# Patient Record
Sex: Female | Born: 1945
Health system: Southern US, Community
[De-identification: ages and names within clinical notes are randomized; demographics above are authoritative.]

## PROBLEM LIST (undated history)

## (undated) DIAGNOSIS — E550 Rickets, active: Secondary | ICD-10-CM

## (undated) DIAGNOSIS — I10 Essential (primary) hypertension: Secondary | ICD-10-CM

## (undated) DIAGNOSIS — C50919 Malignant neoplasm of unspecified site of unspecified female breast: Secondary | ICD-10-CM

## (undated) DIAGNOSIS — N6029 Fibroadenosis of unspecified breast: Secondary | ICD-10-CM

## (undated) DIAGNOSIS — F32A Depression, unspecified: Secondary | ICD-10-CM

## (undated) DIAGNOSIS — B029 Zoster without complications: Secondary | ICD-10-CM

## (undated) DIAGNOSIS — N289 Disorder of kidney and ureter, unspecified: Secondary | ICD-10-CM

## (undated) DIAGNOSIS — C50419 Malignant neoplasm of upper-outer quadrant of unspecified female breast: Secondary | ICD-10-CM

## (undated) DIAGNOSIS — C801 Malignant (primary) neoplasm, unspecified: Secondary | ICD-10-CM

## (undated) DIAGNOSIS — M1990 Unspecified osteoarthritis, unspecified site: Secondary | ICD-10-CM

## (undated) DIAGNOSIS — M839 Adult osteomalacia, unspecified: Secondary | ICD-10-CM

## (undated) DIAGNOSIS — M109 Gout, unspecified: Secondary | ICD-10-CM

## (undated) HISTORY — DX: Essential (primary) hypertension: I10

## (undated) HISTORY — DX: Malignant neoplasm of upper-outer quadrant of unspecified female breast: C50.419

## (undated) HISTORY — DX: Depression, unspecified: F32.A

## (undated) HISTORY — DX: Unspecified osteoarthritis, unspecified site: M19.90

## (undated) HISTORY — DX: Malignant (primary) neoplasm, unspecified: C80.1

## (undated) HISTORY — DX: Fibroadenosis of unspecified breast: N60.29

## (undated) HISTORY — DX: Disorder of phosphorus metabolism, unspecified: E83.30

## (undated) HISTORY — DX: Zoster without complications: B02.9

## (undated) HISTORY — DX: Adult osteomalacia, unspecified: M83.9

## (undated) HISTORY — DX: Gout, unspecified: M10.9

---
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
## 1980-04-21 HISTORY — PX: DILATION AND CURETTAGE OF UTERUS: SHX78

## 2003-04-22 LAB — HM PAP SMEAR: HM Pap smear: NORMAL

## 2007-04-22 DIAGNOSIS — I10 Essential (primary) hypertension: Secondary | ICD-10-CM

## 2007-04-22 HISTORY — DX: Essential (primary) hypertension: I10

## 2008-01-09 ENCOUNTER — Emergency Department: Payer: Self-pay | Admitting: Emergency Medicine

## 2010-04-21 HISTORY — PX: UPPER GI ENDOSCOPY: SHX6162

## 2010-04-21 HISTORY — PX: COLONOSCOPY: SHX174

## 2010-06-25 ENCOUNTER — Ambulatory Visit: Payer: Self-pay | Admitting: Family Medicine

## 2010-06-27 ENCOUNTER — Ambulatory Visit: Payer: Self-pay | Admitting: Family Medicine

## 2010-07-18 LAB — HM COLONOSCOPY: HM Colonoscopy: NORMAL

## 2010-08-07 ENCOUNTER — Ambulatory Visit: Payer: Self-pay | Admitting: General Surgery

## 2011-02-07 ENCOUNTER — Ambulatory Visit: Payer: Self-pay | Admitting: General Surgery

## 2011-04-22 DIAGNOSIS — B029 Zoster without complications: Secondary | ICD-10-CM

## 2011-04-22 DIAGNOSIS — M109 Gout, unspecified: Secondary | ICD-10-CM

## 2011-04-22 DIAGNOSIS — C801 Malignant (primary) neoplasm, unspecified: Secondary | ICD-10-CM

## 2011-04-22 DIAGNOSIS — C50919 Malignant neoplasm of unspecified site of unspecified female breast: Secondary | ICD-10-CM

## 2011-04-22 HISTORY — DX: Malignant neoplasm of unspecified site of unspecified female breast: C50.919

## 2011-04-22 HISTORY — PX: MASTECTOMY: SHX3

## 2011-04-22 HISTORY — DX: Gout, unspecified: M10.9

## 2011-04-22 HISTORY — DX: Malignant (primary) neoplasm, unspecified: C80.1

## 2011-04-22 HISTORY — DX: Zoster without complications: B02.9

## 2011-08-11 ENCOUNTER — Ambulatory Visit: Payer: Self-pay | Admitting: General Surgery

## 2011-08-26 ENCOUNTER — Ambulatory Visit: Payer: Self-pay | Admitting: General Surgery

## 2011-09-18 ENCOUNTER — Ambulatory Visit: Payer: Self-pay | Admitting: General Surgery

## 2011-09-29 ENCOUNTER — Ambulatory Visit: Payer: Self-pay | Admitting: Unknown Physician Specialty

## 2011-09-30 ENCOUNTER — Ambulatory Visit: Payer: Self-pay | Admitting: General Surgery

## 2011-09-30 DIAGNOSIS — C50419 Malignant neoplasm of upper-outer quadrant of unspecified female breast: Secondary | ICD-10-CM

## 2011-09-30 HISTORY — PX: BREAST SURGERY: SHX581

## 2011-09-30 HISTORY — DX: Malignant neoplasm of upper-outer quadrant of unspecified female breast: C50.419

## 2011-11-03 ENCOUNTER — Ambulatory Visit: Payer: Self-pay | Admitting: General Surgery

## 2011-11-03 LAB — CBC WITH DIFFERENTIAL/PLATELET
Basophil #: 0 10*3/uL (ref 0.0–0.1)
Basophil %: 0.5 %
Eosinophil %: 1.3 %
HCT: 39.9 % (ref 35.0–47.0)
HGB: 12.9 g/dL (ref 12.0–16.0)
Lymphocyte #: 2 10*3/uL (ref 1.0–3.6)
Lymphocyte %: 25.3 %
MCH: 29.5 pg (ref 26.0–34.0)
MCV: 91 fL (ref 80–100)
Monocyte #: 0.3 x10 3/mm (ref 0.2–0.9)
Monocyte %: 4.5 %
Neutrophil #: 5.3 10*3/uL (ref 1.4–6.5)
Platelet: 286 10*3/uL (ref 150–440)
RBC: 4.39 10*6/uL (ref 3.80–5.20)
WBC: 7.8 10*3/uL (ref 3.6–11.0)

## 2011-11-03 LAB — COMPREHENSIVE METABOLIC PANEL
Albumin: 4.2 g/dL (ref 3.4–5.0)
Anion Gap: 7 (ref 7–16)
BUN: 16 mg/dL (ref 7–18)
Bilirubin,Total: 0.4 mg/dL (ref 0.2–1.0)
Calcium, Total: 9.2 mg/dL (ref 8.5–10.1)
Co2: 28 mmol/L (ref 21–32)
EGFR (African American): >60
EGFR (Non-African Amer.): 56 — ABNORMAL LOW
Glucose: 159 mg/dL — ABNORMAL HIGH (ref 65–99)
Osmolality: 278 (ref 275–301)
Potassium: 4.1 mmol/L (ref 3.5–5.1)
SGPT (ALT): 24 U/L
Sodium: 137 mmol/L (ref 136–145)
Total Protein: 8.1 g/dL (ref 6.4–8.2)

## 2011-11-04 LAB — CEA: CEA: 1.1 ng/mL (ref 0.0–4.7)

## 2011-11-05 ENCOUNTER — Ambulatory Visit: Payer: Self-pay | Admitting: General Surgery

## 2011-11-06 ENCOUNTER — Ambulatory Visit: Payer: Self-pay | Admitting: General Surgery

## 2011-12-06 DIAGNOSIS — Z8619 Personal history of other infectious and parasitic diseases: Secondary | ICD-10-CM | POA: Insufficient documentation

## 2011-12-31 IMAGING — MG MAM DGTL SCREENING MAMMO W/CAD
1 series · 4 of 4 positions shown · non-contrast
Comparison: none

REASON FOR EXAM: scr unsure of last
COMMENTS:

[R CC · right · 4 of 4 slices shown]
[im 1/4]
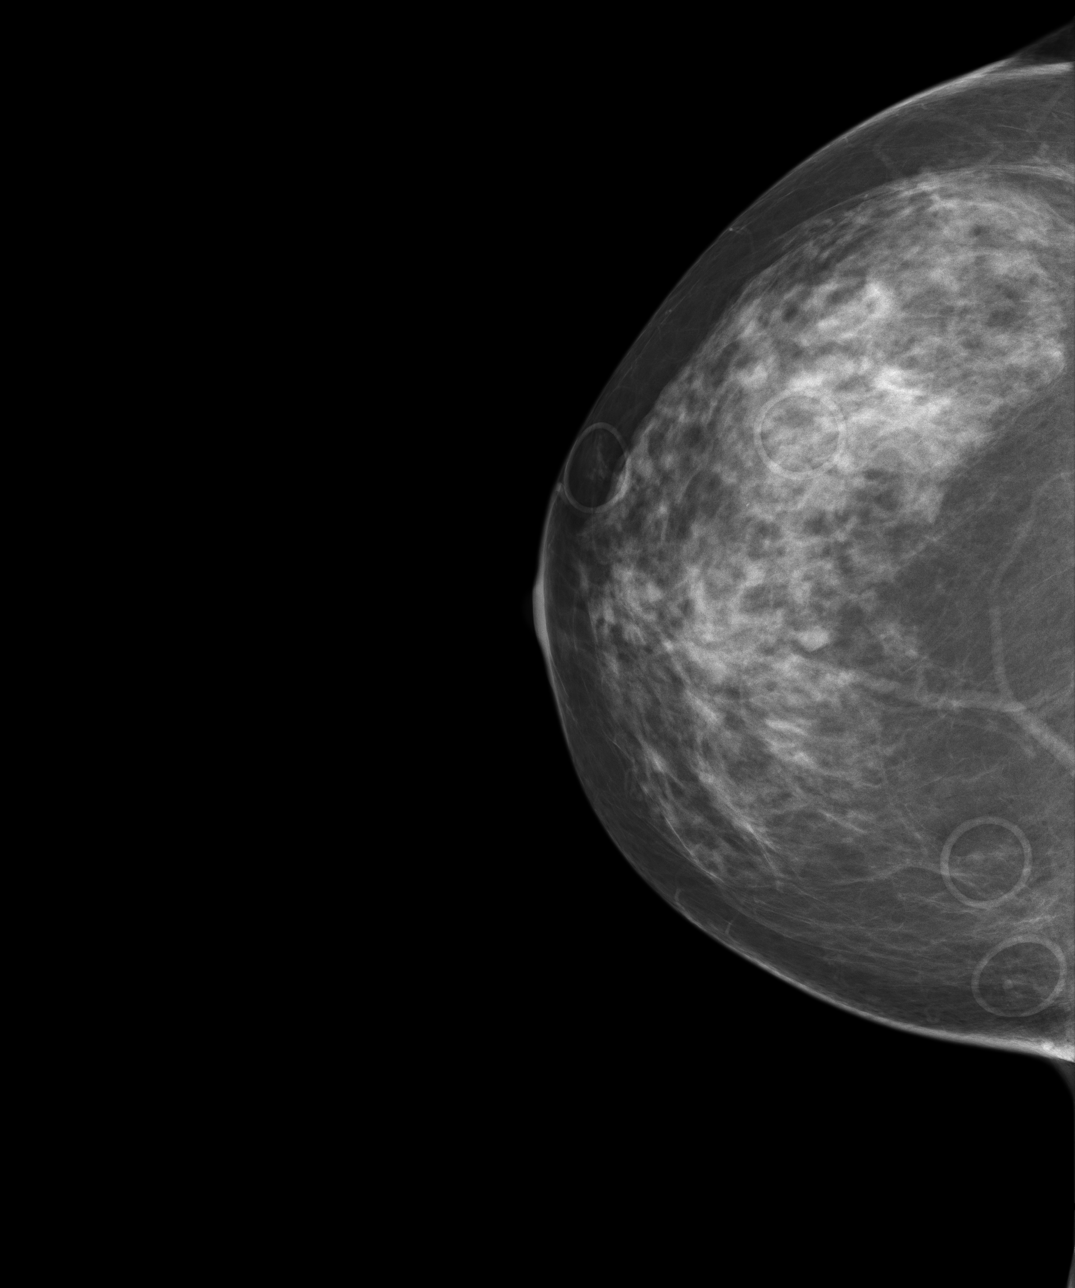
[im 2/4]
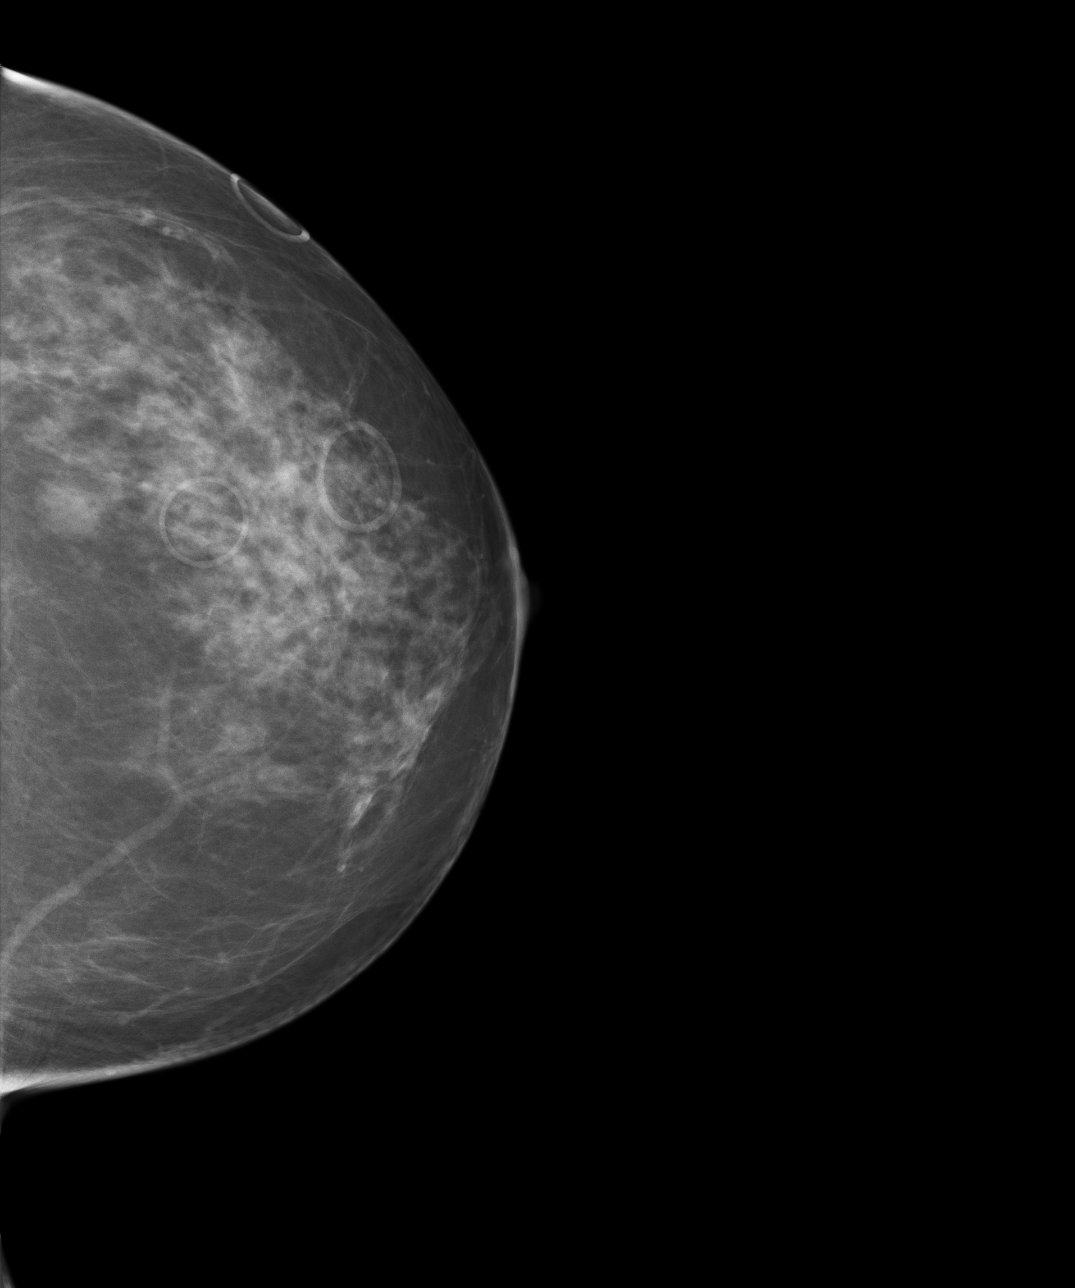
[im 3/4]
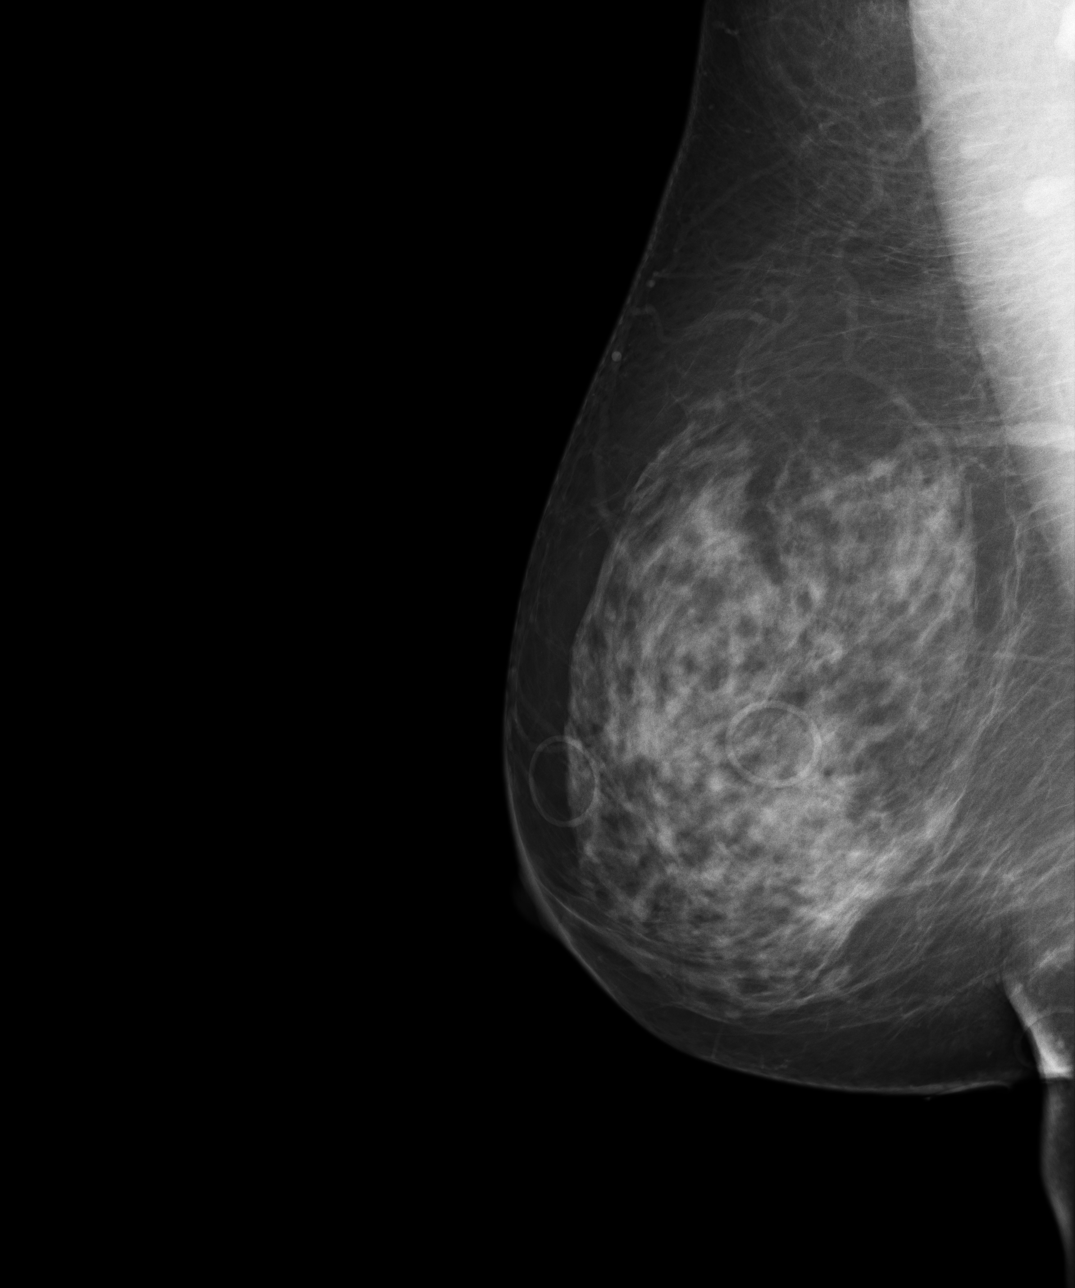
[im 4/4]
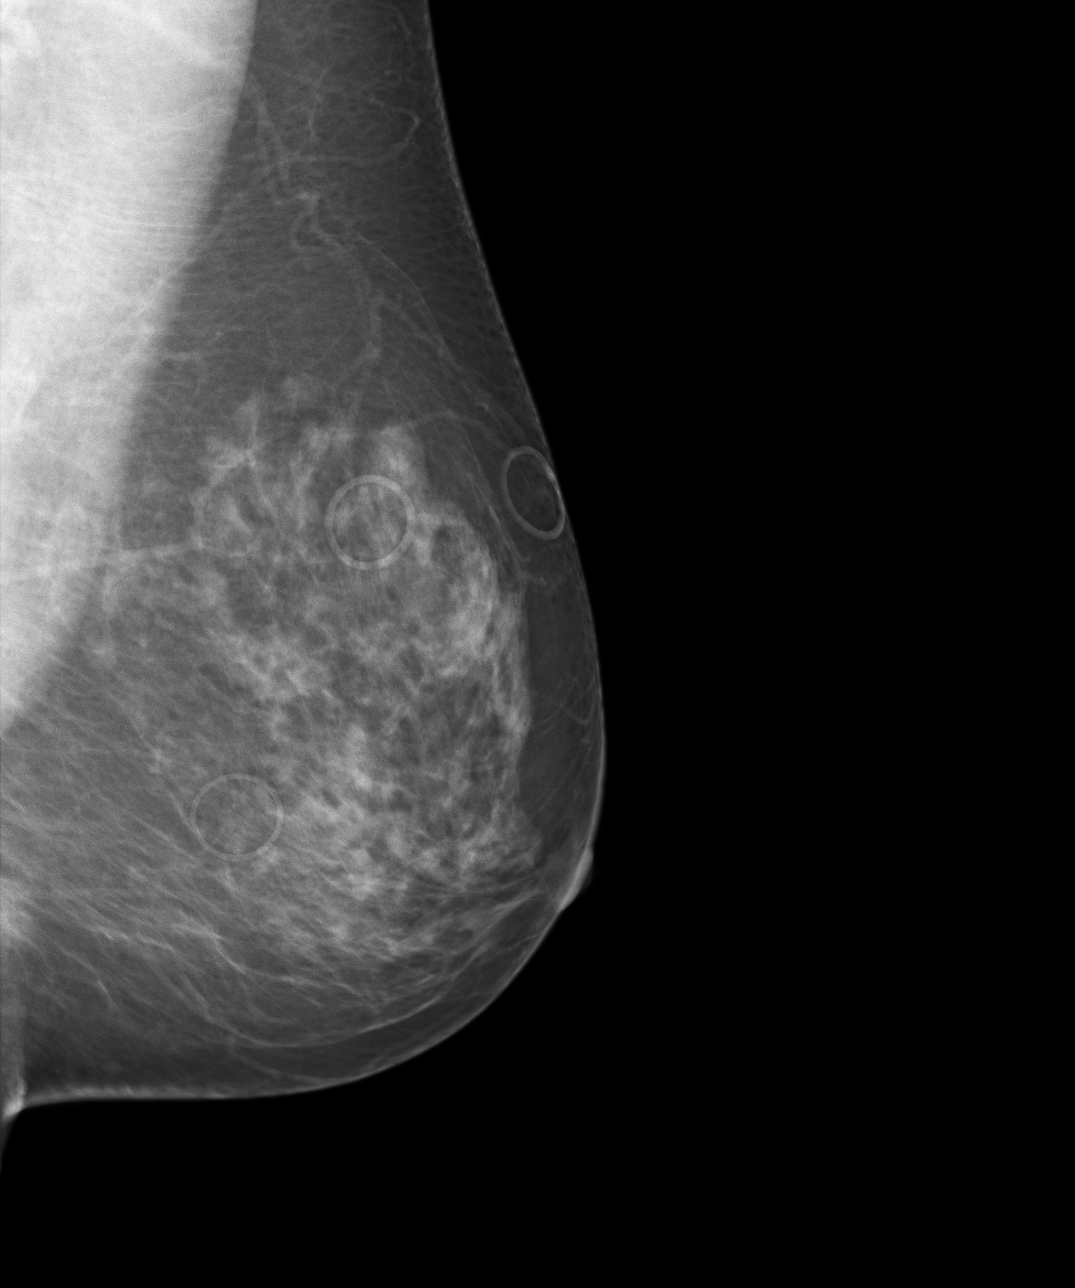

[4 of 4 positions shown; findings below may reference images not displayed]

PROCEDURE:     MAM - MAM DGTL SCREENING MAMMO W/CAD  - June 25, 2010  [DATE]

RESULT:     There is no known family history of breast cancer. No previous
mammograms are available for comparison.

There is a moderate to dense parenchymal pattern bilaterally. Skin lesions
are marked bilaterally. There is an elliptical area of density posteriorly
just lateral to the nipple in the left breast on the CC images which may
project superiorly on the MLO projection. Additional magnification
compression images of this area are recommended. If this area persists,
ultrasound would be recommended.
IMPRESSION: 1. BI-RADS:   Category 0-Needs Additional Imaging Evaluation.
2. Please have the patient return for additional magnification compression
images of the left breast in the area marked and saved electronically. The
images otherwise are not concerning for malignant disease.

A negative mammogram report does not preclude biopsy or other evaluation of
a clinically palpable or otherwise suspicious mass or lesion.  Breast cancer
may not be detected by mammography in up to 10% of cases.

## 2012-07-15 ENCOUNTER — Encounter: Payer: Self-pay | Admitting: General Surgery

## 2012-07-20 LAB — HM DEXA SCAN: HM Dexa Scan: NORMAL

## 2012-08-17 ENCOUNTER — Ambulatory Visit: Payer: Self-pay | Admitting: General Surgery

## 2012-08-23 ENCOUNTER — Encounter: Payer: Self-pay | Admitting: General Surgery

## 2012-08-25 ENCOUNTER — Ambulatory Visit: Payer: Self-pay | Admitting: General Surgery

## 2012-08-26 ENCOUNTER — Encounter: Payer: Self-pay | Admitting: General Surgery

## 2012-08-28 NOTE — Progress Notes (Signed)
Quick Note:  Bone scan shows evidence of osteoporosis of the forearm, mild osteopenia of the femur.  Will need to review calcium/ Vit D intake and consider addition of Fosamax. Not a candidate for Evista secondary to ER+ breast cancer.    Cc: Cristino Martes, MD ______

## 2012-09-08 ENCOUNTER — Encounter: Payer: Self-pay | Admitting: General Surgery

## 2012-09-08 ENCOUNTER — Ambulatory Visit (INDEPENDENT_AMBULATORY_CARE_PROVIDER_SITE_OTHER): Payer: Medicare Other | Admitting: General Surgery

## 2012-09-08 VITALS — BP 138/76 | HR 72 | Resp 12 | Ht <= 58 in | Wt 126.0 lb

## 2012-09-08 DIAGNOSIS — Z8639 Personal history of other endocrine, nutritional and metabolic disease: Secondary | ICD-10-CM | POA: Insufficient documentation

## 2012-09-08 DIAGNOSIS — C50919 Malignant neoplasm of unspecified site of unspecified female breast: Secondary | ICD-10-CM

## 2012-09-08 DIAGNOSIS — Z853 Personal history of malignant neoplasm of breast: Secondary | ICD-10-CM | POA: Insufficient documentation

## 2012-09-08 DIAGNOSIS — C50912 Malignant neoplasm of unspecified site of left female breast: Secondary | ICD-10-CM

## 2012-09-08 DIAGNOSIS — M810 Age-related osteoporosis without current pathological fracture: Secondary | ICD-10-CM

## 2012-09-08 MED ORDER — TAMOXIFEN CITRATE 20 MG PO TABS: 20.0000 mg | ORAL_TABLET | Freq: Every day | ORAL | Status: DC

## 2012-09-08 NOTE — Progress Notes (Signed)
Patient ID: Felicia Robinson, female   DOB: [birth year], 67 y.o.   MRN: [id number]  Chief Complaint  Patient presents with  . Other    mammogram    HPI Felicia Robinson is a 67 y.o. female who presents for a follow up mammogram. The most recent mammogram was done on 08/25/12 with birad category 2. The patient has a history of left breast cancer diagnosed in July 2013 She had a left breast mastectomy in 2013. The patient admits regular self breast checks and gets regular mammograms done. She also had a bone density scan done on 08/17/12 at [facility].   The patient reports modest shoulder discomfort and a sense that her strength is not as good as in the past. She is having intermittent hot flashes and as well as some significant mood changes. This all began after initiation of Femara. She has had some home stress, but in general was doing well.   HPI  Past Medical History  Diagnosis Date  . Hypertension 2009  . Gout 2013  . Shingles 2013  . Cancer 2012    L breast  . Disorders of phosphorus metabolism     rickets  . Fibroadenosis of breast   . Osteomalacia   . Malignant neoplasm of upper-outer quadrant of female breast 2012    L breast    Past Surgical History  Procedure Laterality Date  . Mastectomy Left 2013    with SN biopsy  . Colonoscopy  2012  . Upper gi endoscopy  2012  . Tonsillectomy  1953  . Dilation and curettage of uterus  1982  . Breast surgery Left 10/2010    Family History  Problem Relation Age of Onset  . Lymphoma Brother 44    Hodgkin's Lymphoma  . Cancer Maternal Uncle 82    cancer of the esophagus    Social History History  Substance Use Topics  . Smoking status: Former Smoker -- 0.50 packs/day for 30 years    Types: Cigarettes    Quit date: 04/21/2006  . Smokeless tobacco: Never Used  . Alcohol Use: Yes    Allergies  Allergen Reactions  . Codeine Nausea And Vomiting  . Other     Pain medications Altered mental status  . Shellfish Allergy Diarrhea and  Nausea And Vomiting    Current Outpatient Prescriptions  Medication Sig Dispense Refill  . ALPRAZolam (XANAX) 0.5 MG tablet Take 60 mg by mouth daily.      Marland Kitchen aspirin 81 MG chewable tablet Chew 81 mg by mouth daily.      . calcium carbonate (OS-CAL) 600 MG TABS Take 600 mg by mouth daily.      . fish oil-omega-3 fatty acids 1000 MG capsule Take 2 g by mouth daily.      Marland Kitchen letrozole (FEMARA) 2.5 MG tablet Take 30 mg by mouth daily.      Marland Kitchen losartan (COZAAR) 100 MG tablet Take 100 mg by mouth daily.      . Multiple Vitamins-Minerals (CENTRUM SILVER ADULT 50+ PO) Take by mouth.      . nebivolol (BYSTOLIC) 10 MG tablet Take 10 mg by mouth daily.      . tamoxifen (NOLVADEX) 20 MG tablet Take 1 tablet (20 mg total) by mouth daily.  30 tablet  11   No current facility-administered medications for this visit.    Review of Systems Review of Systems  Constitutional: Negative.   Respiratory: Negative.   Cardiovascular: Negative.     Blood pressure 138/76, pulse  72, resp. rate 12, height 4\' 8"  (1.422 m), weight 126 lb (57.153 kg).  Physical Exam Physical Exam  Constitutional: She appears well-developed and well-nourished.  Neck: Trachea normal. No mass and no thyromegaly present.  Cardiovascular: Normal rate, regular rhythm, normal heart sounds and normal pulses.   No murmur heard. Pulmonary/Chest: Effort normal and breath sounds normal. Right breast exhibits no inverted nipple, no mass, no nipple discharge, no skin change and no tenderness.  Left mastectomy site doing well. Left axilla is fine.   Lymphadenopathy:    She has no cervical adenopathy.    She has no axillary adenopathy.    Data Reviewed Bone density showed the significant osteoporosis in the forearm, minimal changes in the femur. I spoke with the radiologist and there is no compensation factor for patients with rickets, so this will require treatment.  Unilateral right mammogram dated 08/25/2012 was reviewed. No interval  change. BI-RAD-2.  Assessment      Left breast cancer, estrogen receptor positive.  Possible myalgia secondary to Femara, possible osteoporosis secondary to Femara.    Plan    The patient has been taking a calcium supplement daily, she's been asked to increase his to twice a day. She will complete her few remaining tablets of Femara and starting on June one, 2014 we'll initiate tamoxifen therapy, 20 mg daily. She may have some the same hot flash and side effects probably resolution of the myalgias and improvement in the bone density. She was asked to give a phone progress report on July 1, and encouraged to take for at least a month to see how the side effects play out.  If her mood swings don't improve consideration would be to discontinuing her benzodiazepine and initiating Effexor.  The plan for followup exam in 6 months.       Felicia Robinson 09/08/2012, 10:54 AM

## 2012-09-08 NOTE — Patient Instructions (Addendum)
Patient advised to double up on her calcium daily. She is also advised to finish Femara and then start Tamoxifen 20 mg by mouth daily. Patient to return in 6 months. Patient to give our office a call in 1 month for a report on new medication.

## 2012-10-19 ENCOUNTER — Telehealth: Payer: Self-pay

## 2012-10-19 NOTE — Telephone Encounter (Signed)
Patient called to say med change to Tamoxifen has been working very well for her. She says the pain had greatly decreased and that she has had a big decrease in the number of hot flashes that she has been experiencing. She reports that she has been on the Tamoxifen for 30 day now.

## 2013-03-09 ENCOUNTER — Ambulatory Visit (INDEPENDENT_AMBULATORY_CARE_PROVIDER_SITE_OTHER): Payer: Medicare Other | Admitting: General Surgery

## 2013-03-09 ENCOUNTER — Encounter: Payer: Self-pay | Admitting: General Surgery

## 2013-03-09 VITALS — BP 170/82 | HR 82 | Resp 14 | Ht <= 58 in | Wt 127.0 lb

## 2013-03-09 DIAGNOSIS — C50919 Malignant neoplasm of unspecified site of unspecified female breast: Secondary | ICD-10-CM

## 2013-03-09 DIAGNOSIS — C50912 Malignant neoplasm of unspecified site of left female breast: Secondary | ICD-10-CM

## 2013-03-09 NOTE — Patient Instructions (Signed)
Patient to return in May 2015 right breast diagnotic mammogram .

## 2013-03-09 NOTE — Progress Notes (Signed)
Patient ID: Felicia Robinson, female   DOB: 1945/07/28, 67 y.o.   MRN: [id number]  Chief Complaint  Patient presents with  . Follow-up    breast cancer    HPI Felicia Robinson is a 67 y.o. female here today following up for her 6 month breast cancer. Patient states she has not had any new problems. She is still taking her Tamoxifen. She states still having intermittent hot flashes, but these are significantly less frequent than what she was experiencing making use of Letrazol. The musculoskeletal symptoms she was experiencing  Letrazol have not recurred since she switched to tamoxifen.  HPI  Past Medical History  Diagnosis Date  . Hypertension 2009  . Gout 2013  . Shingles 2013  . Cancer 2012    L breast  . Disorders of phosphorus metabolism     rickets  . Fibroadenosis of breast   . Osteomalacia   . Malignant neoplasm of upper-outer quadrant of female breast 2012    L breast    Past Surgical History  Procedure Laterality Date  . Mastectomy Left 2013    with SN biopsy  . Colonoscopy  2012  . Upper gi endoscopy  2012  . Tonsillectomy  1953  . Dilation and curettage of uterus  1982  . Breast surgery Left 10/2010    Family History  Problem Relation Age of Onset  . Lymphoma Brother 44    Hodgkin's Lymphoma  . Cancer Maternal Uncle 73    cancer of the esophagus    Social History History  Substance Use Topics  . Smoking status: Former Smoker -- 0.50 packs/day for 30 years    Types: Cigarettes    Quit date: 04/21/2006  . Smokeless tobacco: Never Used  . Alcohol Use: Yes    Allergies  Allergen Reactions  . Codeine Nausea And Vomiting  . Other     Pain medications Altered mental status  . Shellfish Allergy Diarrhea and Nausea And Vomiting    Current Outpatient Prescriptions  Medication Sig Dispense Refill  . ALPRAZolam (XANAX) 0.5 MG tablet Take 60 mg by mouth daily.      Marland Kitchen aspirin 81 MG chewable tablet Chew 81 mg by mouth daily.      . calcium carbonate (OS-CAL) 600  MG TABS Take 600 mg by mouth daily.      Marland Kitchen letrozole (FEMARA) 2.5 MG tablet Take 30 mg by mouth daily.      Marland Kitchen losartan (COZAAR) 100 MG tablet Take 100 mg by mouth daily.      . Multiple Vitamins-Minerals (CENTRUM SILVER ADULT 50+ PO) Take by mouth.      . nebivolol (BYSTOLIC) 10 MG tablet Take 10 mg by mouth daily.      . tamoxifen (NOLVADEX) 20 MG tablet Take 1 tablet (20 mg total) by mouth daily.  30 tablet  11   No current facility-administered medications for this visit.    Review of Systems Review of Systems  Constitutional: Negative.   Respiratory: Negative.   Cardiovascular: Negative.     Blood pressure 170/82, pulse 82, resp. rate 14, height 4\' 8"  (1.422 m), weight 127 lb (57.607 kg).  Physical Exam Physical Exam  Constitutional: She is oriented to person, place, and time. She appears well-nourished.  Eyes: No scleral icterus.  Cardiovascular: Normal rate, regular rhythm and normal heart sounds.   Pulmonary/Chest: Breath sounds normal. Right breast exhibits no inverted nipple, no mass, no nipple discharge, no skin change and no tenderness.  Left mastectomy site is  well healed.   Abdominal: Soft. Normal appearance and bowel sounds are normal.  Lymphadenopathy:    She has no cervical adenopathy.       Right axillary: No pectoral and no lateral adenopathy present.  Neurological: She is alert and oriented to person, place, and time.  Skin: Skin is warm and dry.  No calf tenderness or swelling appreciated.  Data Reviewed No new data.  Assessment    Doing well 2 years out from management of her T1b left breast cancer.     Plan    The patient was encouraged to call should she appreciate any leg swelling or dyspnea.  We'll plan for a followup examination with a right breast diagnostic mammogram in 6 months.        Felicia Robinson 03/09/2013, 9:29 PM

## 2013-07-18 ENCOUNTER — Telehealth: Payer: Self-pay | Admitting: General Surgery

## 2013-07-18 NOTE — Telephone Encounter (Signed)
PATIENT CALLED TO ASK IF SHE COULD DISCONTINUE TAKING TAMOXIFEN UNTIL SHE RETURNS IN MAY 2015.SHE HAS BEGUN TO HAVE MORE BONE PAIN,MUSCLE ACHES WITH Emery.SHE SPOKE WITH THE PHARMACIST AND HE STATED THIS COULD BE SIDE EFFECTS OF TAMOXIFEN.SHE ALSO HAS RICKETS.

## 2013-07-26 NOTE — Telephone Encounter (Signed)
Notified patient as instructed, patient pleased. Discussed follow-up appointments, patient agrees  

## 2013-07-26 NOTE — Telephone Encounter (Signed)
Notify patient that it is unlikely related to Tamoxifen, but she can d/c until May and we will see if she improves.  Can look at other options if she is better off the medication.

## 2013-09-06 ENCOUNTER — Encounter: Payer: Self-pay | Admitting: General Surgery

## 2013-09-06 LAB — HM MAMMOGRAPHY: HM Mammogram: NORMAL

## 2013-09-14 ENCOUNTER — Encounter: Payer: Self-pay | Admitting: General Surgery

## 2013-09-14 ENCOUNTER — Ambulatory Visit (INDEPENDENT_AMBULATORY_CARE_PROVIDER_SITE_OTHER): Payer: Medicare Other | Admitting: General Surgery

## 2013-09-14 VITALS — BP 130/72 | HR 68 | Resp 14 | Ht <= 58 in | Wt 130.0 lb

## 2013-09-14 DIAGNOSIS — Z853 Personal history of malignant neoplasm of breast: Secondary | ICD-10-CM

## 2013-09-14 NOTE — Patient Instructions (Addendum)
Continue self breast exams. Call office for any new breast issues or concerns.  Tamoxifen 1/2 tablet daily for one month and call office with status report.

## 2013-09-14 NOTE — Progress Notes (Signed)
Patient ID: Felicia Robinson, female   DOB: November 01, 1945, 68 y.o.   MRN: [id number]  Chief Complaint  Patient presents with  . Follow-up    mammogram    HPI Felicia Robinson is a 68 y.o. female who presents for a breast evaluation. The most recent mammogram was done on 09/06/13. Patient does perform regular self breast checks and gets regular mammograms done.  No new breast issues. She does state that the calf pain and feet swelling is somewhat better since stopping the tamoxifen in April. She does still have bone pain (legs) but she have a history of Rickets. She reports that in winter she frequently has difficulty deep, bony pain in the thighs more so than the upper extremity.  She uses a cane when she is walking long distances.  She has started doing yoga again which does seem to help. Pleased with [facility] for mammograms.   HPI  Past Medical History  Diagnosis Date  . Hypertension 2009  . Gout 2013  . Shingles 2013  . Disorders of phosphorus metabolism     rickets  . Fibroadenosis of breast   . Osteomalacia   . Cancer 2013    L breast  . Malignant neoplasm of upper-outer quadrant of female breast 09/30/2011    7 mm low-grade invasive mammary carcinoma with focal DCIS. ER 90%, PR 1%, HER-2/neu not overexpressing.    Past Surgical History  Procedure Laterality Date  . Mastectomy Left 2013    with SN biopsy  . Colonoscopy  2012  . Upper gi endoscopy  2012  . Tonsillectomy  1953  . Dilation and curettage of uterus  1982  . Breast surgery Left 09/30/2011    Left simple mastectomy with sentinel node biopsy    Family History  Problem Relation Age of Onset  . Lymphoma Brother 44    Hodgkin's Lymphoma  . Cancer Maternal Uncle 48    cancer of the esophagus    Social History History  Substance Use Topics  . Smoking status: Former Smoker -- 0.50 packs/day for 30 years    Types: Cigarettes    Quit date: 04/21/2006  . Smokeless tobacco: Never Used  . Alcohol Use: Yes     Allergies  Allergen Reactions  . Codeine Nausea And Vomiting  . Other     Pain medications Altered mental status  . Shellfish Allergy Diarrhea and Nausea And Vomiting    Current Outpatient Prescriptions  Medication Sig Dispense Refill  . ALPRAZolam (XANAX) 0.5 MG tablet Take 60 mg by mouth daily.      Marland Kitchen aspirin 81 MG chewable tablet Chew 81 mg by mouth daily.      . calcium carbonate (OS-CAL) 600 MG TABS Take 600 mg by mouth daily.      Marland Kitchen losartan (COZAAR) 100 MG tablet Take 100 mg by mouth daily.      . Multiple Vitamins-Minerals (CENTRUM SILVER ADULT 50+ PO) Take by mouth.      . nebivolol (BYSTOLIC) 10 MG tablet Take 10 mg by mouth daily.       No current facility-administered medications for this visit.    Review of Systems Review of Systems  Constitutional: Negative.   Respiratory: Negative.   Cardiovascular: Negative.     Blood pressure 130/72, pulse 68, resp. rate 14, height 4\' 8"  (1.422 m), weight 130 lb (58.968 kg).  Physical Exam Physical Exam  Constitutional: She is oriented to person, place, and time. She appears well-developed and well-nourished.  Neck: Neck supple.  Cardiovascular: Normal rate, regular rhythm and normal heart sounds.   Pulmonary/Chest: Effort normal and breath sounds normal. Right breast exhibits no inverted nipple, no mass, no nipple discharge, no skin change and no tenderness.  Left mastectomy site well healed.   Musculoskeletal:  No lower extremity edema noted.  Lymphadenopathy:    She has no cervical adenopathy.    She has no axillary adenopathy.  Neurological: She is alert and oriented to person, place, and time.  Skin: Skin is warm and dry.    Data Reviewed Right breast mammogram dated 09/06/2013 completed at [facility] showed no interval change. BI-RAD-1.  Assessment    T1b, N0 carcinoma left breast. No evidence of recurrent disease.  Rickets.    Plan    The patient was amenable to a retrial of tamoxifen now the  weather is warm. We'll start out at 10 mg (one half tablet) daily for one month. If she does not appreciated an exacerbation in her bone pain or develop recurrent lower extremity (ankle) swelling she'll decrease the dose to 20 mg daily after a phone progress report. If she fails to tolerate antiestrogen therapy, we'll consider other options.  Arrangements were made for a follow up right breast mammogram and office exam in one year.     Tamoxifen 1/2 tablet daily for one month and call office with status report.  PCP: Yehuda Mao Rajanae Mantia 09/15/2013, 8:45 AM

## 2013-09-15 ENCOUNTER — Encounter: Payer: Self-pay | Admitting: General Surgery

## 2013-09-18 ENCOUNTER — Encounter: Payer: Self-pay | Admitting: *Deleted

## 2013-10-04 ENCOUNTER — Encounter: Payer: Self-pay | Admitting: General Surgery

## 2013-10-11 ENCOUNTER — Encounter: Payer: Self-pay | Admitting: General Surgery

## 2013-10-19 ENCOUNTER — Telehealth: Payer: Self-pay

## 2013-10-19 NOTE — Telephone Encounter (Signed)
Patient called to give an update on her medication. She has been on 1/2 tablet of Tamoxifen daily for one month now. She reports that her symptoms have not worsened since being on this dose. She feels like her symptoms are due to her history of Rickets. She is wondering if she should go back to the full dose of Tamoxifen.

## 2013-10-26 ENCOUNTER — Telehealth: Payer: Self-pay | Admitting: *Deleted

## 2013-10-26 ENCOUNTER — Other Ambulatory Visit: Payer: Self-pay | Admitting: General Surgery

## 2013-10-26 NOTE — Telephone Encounter (Signed)
Pt called about a week ago to give update on how she was doing on tamoxifen 1/2 daily. She was still wondering if she needs to continue taking 1/2 a pill a day? She also needs more refills on tamoxifen, she had let her last refill run out in the end of May 2015.

## 2013-10-28 NOTE — Telephone Encounter (Signed)
Reviewed bony pain, no change from baseline related to rickets on 10 mg Tamoxifen daily. She will increase back to 20 mg daily and report her tolerance. If no pain, continue 20 mg daily, if recurrent pain, decrease back to 10 mg daily. She will notify the office what dose is working. Office f/u as previously scheduled.

## 2013-12-06 ENCOUNTER — Other Ambulatory Visit: Payer: Self-pay | Admitting: General Surgery

## 2014-01-09 ENCOUNTER — Telehealth: Payer: Self-pay | Admitting: *Deleted

## 2014-01-09 LAB — LIPID PANEL
Cholesterol: 197 mg/dL (ref 0–200)
HDL: 47 mg/dL (ref 35–70)
LDL Cholesterol: 111 mg/dL
Triglycerides: 194 mg/dL — AB (ref 40–160)

## 2014-01-09 NOTE — Telephone Encounter (Signed)
Pt stated that she was taking tamoxifen 1 pill a day and started having leg pain so for about 2 weeks now she has went down to taking a half a pill a day and feels fine. She just wanted to let you know!

## 2014-01-10 NOTE — Telephone Encounter (Signed)
Dosage change noted. Unable to tolerate Femara in the past. Will continue on lower dose, now 2+years into treatment.

## 2014-01-10 NOTE — Telephone Encounter (Signed)
I spoke with pt and advised her that Dr. Bary Castilla said it was ok to continue on 1/2 of a 20mg  tamoxifen tablet daily. She stated that when she was taking the whole dose she was having a lot of pain particularly in her legs and hips and that taking just half the dose made it more tolerable for her to do her activities of daily living. She verbalized understanding in regard to her medication and was appreciative of the phone call.

## 2014-01-12 LAB — HEMOGLOBIN A1C: Hgb A1c MFr Bld: 5.1 % (ref 4.0–6.0)

## 2014-01-26 ENCOUNTER — Telehealth: Payer: Self-pay | Admitting: *Deleted

## 2014-01-26 NOTE — Telephone Encounter (Signed)
Pt called and stated that is taking tamoxifen 10mg  a day, she doesn't have any refills due to seeing how 10mg  would work with her, she stated that she is doing good on 10mg  once a day and she would like to get some more refills called into her pharmacy ([facility] in [place]) and the patient also went over to [facility] to get more prothesis and bras and they told her since it has been two years that she needs to get a prescription for that.

## 2014-01-31 MED ORDER — TAMOXIFEN CITRATE 10 MG PO TABS: 10.0000 mg | ORAL_TABLET | Freq: Every day | ORAL | Status: DC

## 2014-01-31 NOTE — Telephone Encounter (Signed)
Please send a prescription for tamoxifen: 10 mg, #90 with 3 refills.

## 2014-01-31 NOTE — Addendum Note (Signed)
Addended by: Carson Myrtle on: 01/31/2014 10:28 AM   Modules accepted: Orders

## 2014-01-31 NOTE — Telephone Encounter (Signed)
Rx sent and RX for bras and prosthesis faxed to [facility].

## 2014-02-20 ENCOUNTER — Encounter: Payer: Self-pay | Admitting: General Surgery

## 2014-03-02 IMAGING — MG MM MAMMO DIAGNOSTIC UNILATERAL*R*
1 series · 3 of 3 positions shown · non-contrast
Comparison: none

REASON FOR EXAM: HX BR CA LT MAST
COMMENTS:

[R CC · right · 3 of 3 slices shown]
[im 1/3]
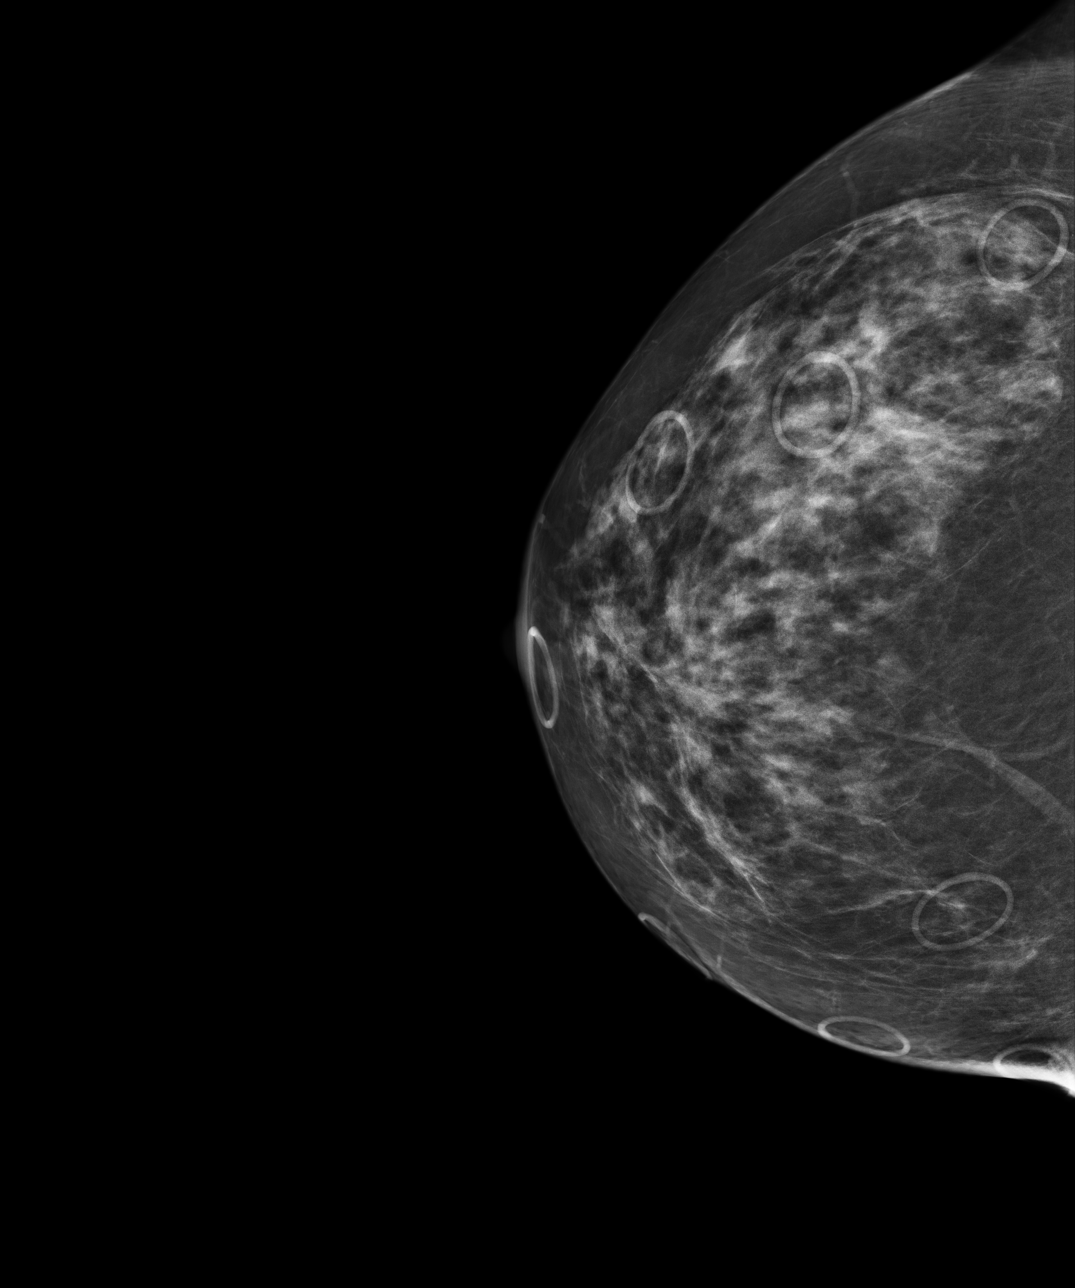
[im 2/3]
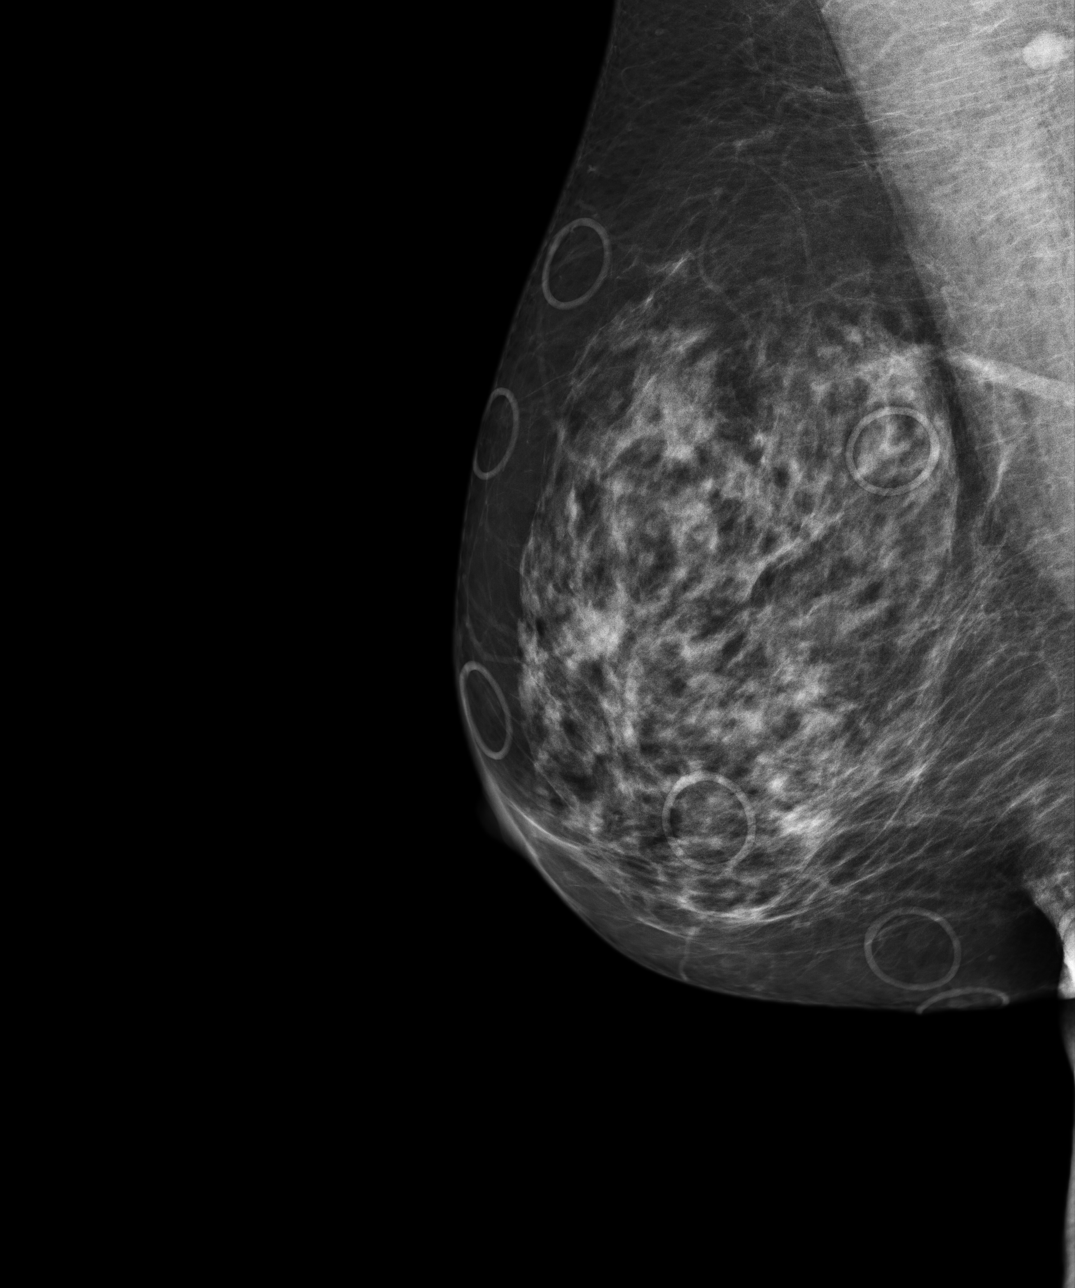
[im 3/3]
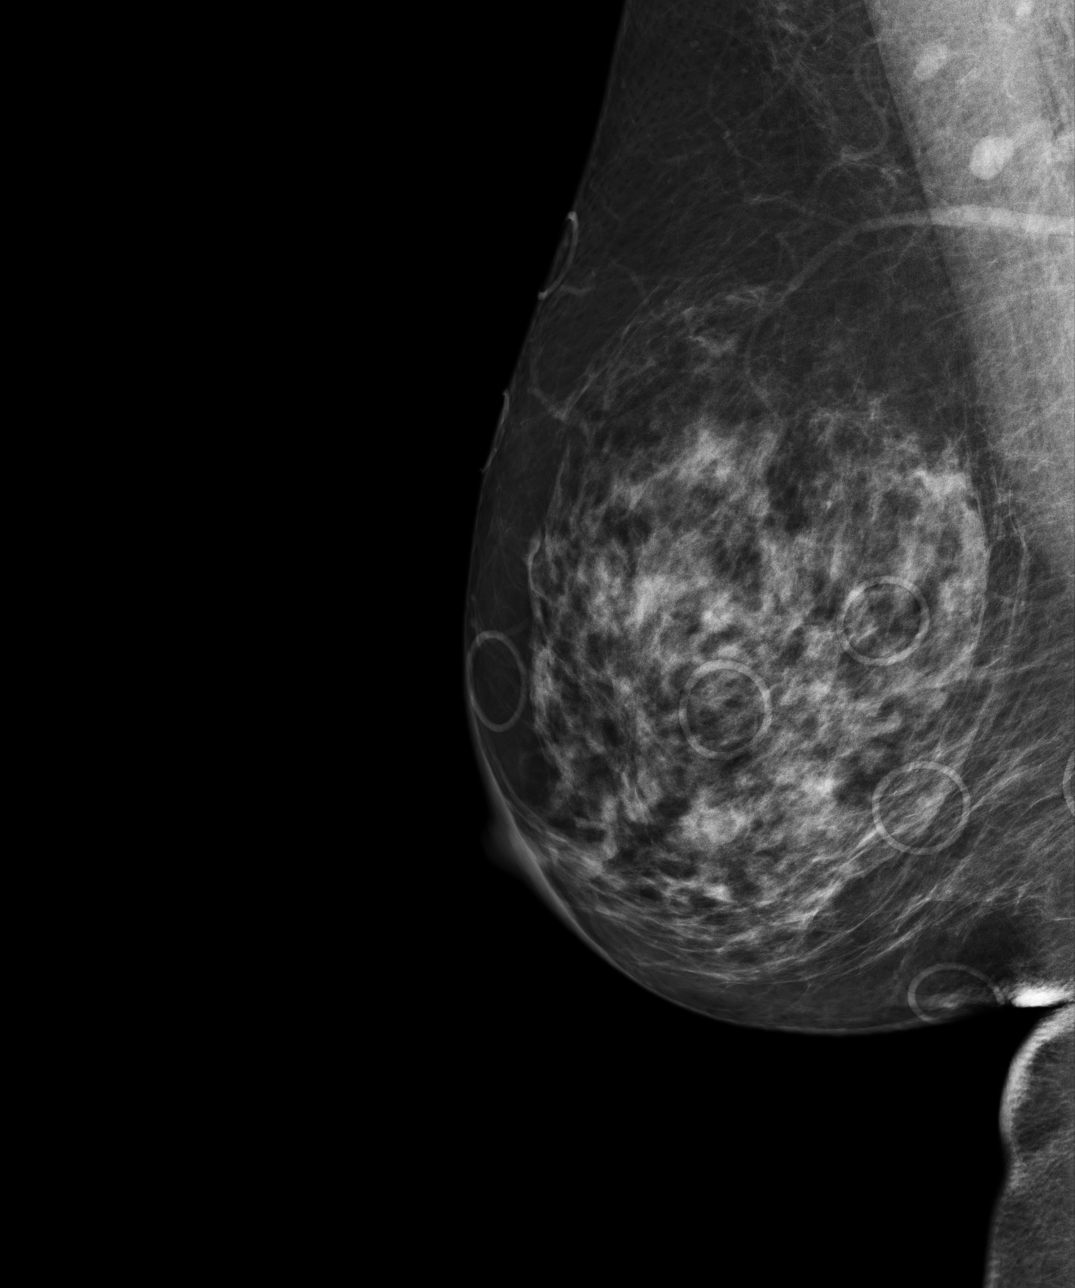

[3 of 3 positions shown; findings below may reference images not displayed]

PROCEDURE:     MAM - MAM DGTL UNI MAM RT BREAST W/CAD  - August 25, 2012  [DATE]

RESULT:     The patient has a history of left breast cancer undergoing
lumpectomy in September 2011 and mastectomy in October 2011. There is no family
history of breast cancer. Comparison is made to previous digital right
breast images dated 11 August, 2011 as well as 25 June, 2010. The right breast
exhibits a heterogeneously dense pattern of parenchyma with multiple skin
lesions marked. There is no dominant mass, developing density, malignant
calcification or architectural distortion. The appearance is stable.
IMPRESSION: Stable unilateral right mammogram with an unremarkable
appearance of the right breast. Please continue to encourage appropriate
mammographic followup and monthly breast self exam. BREAST COMPOSITION: The
breast composition is HETEROGENEOUSLY DENSE (glandular tissue is 51-75%)
This may decrease the sensitivity of mammography.  BI-RADS: Category 2-
Benign Finding

A NEGATIVE MAMMOGRAM REPORT DOES NOT PRECLUDE BIOPSY OR OTHER EVALUATION OF
A CLINICALLY PALPABLE OR OTHERWISE SUSPICIOUS MASS OR LESION. BREAST CANCER
MAY NOT BE DETECTED IN UP TO 10% OF CASES.

[REDACTED]

## 2014-08-03 ENCOUNTER — Other Ambulatory Visit: Payer: Self-pay

## 2014-08-03 DIAGNOSIS — C50412 Malignant neoplasm of upper-outer quadrant of left female breast: Secondary | ICD-10-CM

## 2014-08-08 NOTE — Op Note (Signed)
PATIENT NAME:  Felicia Robinson, Felicia Robinson MR#:  [id number] DATE OF BIRTH:  1945-11-28  DATE OF PROCEDURE:  11/06/2011  PREOPERATIVE DIAGNOSIS: Infiltrating ductal carcinoma of the left breast.   POSTOPERATIVE DIAGNOSIS: Infiltrating ductal carcinoma of the left breast.   OPERATIVE PROCEDURE:  Left simple mastectomy and sentinel node biopsy.   SURGEON: Hervey Ard, MD   ANESTHESIA: General by LMA under Dr. Marcello Moores.   ESTIMATED BLOOD LOSS: 50 mL.   CLINICAL NOTE: This 69 year old woman was recently identified with a 7-mm infiltrating left breast cancer. Given her options for management, she desired to proceed to mastectomy. She had been advised of the equivalent results with local resection and postoperative radiation.   PROCEDURE: She was injected with technetium with sulfur colloid prior to surgery. After the induction of anesthesia and alcohol prep, a total of 6 mL of methylene blue diluted 1:2 with normal saline was injected in the subareolar plexus. The breast was then prepped with ChloraPrep and draped. A transverse incision was utilized. The skin was incised sharply. The limits of dissection were the clavicle superiorly, sternum medially, rectus fascia inferiorly and the serratus muscle laterally. The breast was elevated off the pectoralis muscle taking the fascia of that muscle with the specimen. The intercostal perforators were controlled with 3-0 Vicryl ties. A single hot blue node was identified in the axilla. Touch preps reported by Bryan Lemma, M.D. from pathology were negative for macro metastatic disease. After the breast was removed and orientated, the area was irrigated with sterile water. Good hemostasis was appreciated. A single Blake drain was brought out through the medial aspect of the inferior incision and anchored in place with nylon suture. The skin flaps were approximated with a running 2-0 Vicryl deep dermal suture in multiple segments. Benzoin and Steri-Strips followed by Telfa,  fluffed gauze, Kerlix, and an Ace wrap were then applied.      The patient tolerated the procedure well and was taken to the recovery room in stable condition.   ____________________________ Robert Bellow, MD [id number] D: 11/07/2011 08:53:59 ET T: 11/07/2011 11:39:59 ET JOB#: [id number]  cc: Robert Bellow, MD, <Dictator> Bethena Roys. Ancil Boozer, MD JEFFREY Amedeo Kinsman MD ELECTRONICALLY SIGNED 11/07/2011 18:01

## 2014-08-13 NOTE — Op Note (Signed)
PATIENT NAME:  Felicia Robinson, Felicia Robinson MR#:  [id number] DATE OF BIRTH:  Mar 02, 1946  DATE OF PROCEDURE:  09/30/2011  PREOPERATIVE DIAGNOSIS: Abnormal left breast mammogram and ultrasound.   POSTOPERATIVE DIAGNOSIS: Abnormal left breast mammogram and ultrasound.   OPERATIVE PROCEDURE: Left breast biopsy with needle localization and ultrasound guidance.   OPERATING SURGEON: Robert Bellow, MD   ANESTHESIA: General by LMA under Dr. Phineas Douglas, Marcaine 0.5% plain, 30 mL local infiltration.   ESTIMATED BLOOD LOSS: Minimal.   CLINICAL NOTE: This 69 year old woman had a recent change in her mammogram with a suggestion of a new density that could not be visualized on ultrasound or on stereotactic imaging. She was felt to be a candidate for a needle localization and biopsy.   Localizing wire was placed by Crisoforo Oxford, MD from Radiology having previously reviewed the films together the morning of the procedure. The wire was placed in the lateral aspect.   PROCEDURE: The patient underwent general anesthesia and tolerated this well. The wire was trimmed and the breast prepped with ChloraPrep and draped. Ultrasound was used to confirm the path of the wire as well as the two hypoechoic areas adjacent to the previous biopsy site in the 12 o'clock position. Marcaine was infiltrated for postoperative analgesia. A transverse incision was made over the thickened part of the wire based on ultrasound guidance and carried down through the skin and subcutaneous tissue. Beginning at the start of the breast parenchyma and for the length of the wire from the beginning of the thick portion to the hook, the breast tissue was removed from the underlying pectoralis fascia en bloc. It was orientated and specimen radiograph obtained. At the end of the procedure, the tissue was taken to the pathologist for review and orientation. Good hemostasis was noted with electrocautery. The wound was approximated in layers with 2-0 Vicryl  figure-of-eight sutures. The skin was closed with a running 4-0 Vicryl subcuticular suture. Benzoin, Steri-Strips, Telfa, and Tegaderm dressing was then applied.   The patient tolerated the procedure well and was taken to the recovery room in stable condition.   ____________________________ Robert Bellow, MD jwb:drc D: 09/30/2011 14:01:37 ET T: 09/30/2011 14:16:54 ET JOB#: [id number]  cc: Robert Bellow, MD, <Dictator> Bethena Roys. Ancil Boozer, MD  Jacarie Pate Amedeo Kinsman MD ELECTRONICALLY SIGNED 09/30/2011 22:19

## 2014-08-23 ENCOUNTER — Ambulatory Visit: Payer: Medicare Other | Attending: Family Medicine | Admitting: Physical Therapy

## 2014-08-23 DIAGNOSIS — R2689 Other abnormalities of gait and mobility: Secondary | ICD-10-CM | POA: Diagnosis present

## 2014-08-23 DIAGNOSIS — R296 Repeated falls: Secondary | ICD-10-CM | POA: Insufficient documentation

## 2014-08-23 DIAGNOSIS — R262 Difficulty in walking, not elsewhere classified: Secondary | ICD-10-CM | POA: Diagnosis not present

## 2014-08-23 NOTE — Therapy (Signed)
[place] MAIN [facility] SERVICES [address] [place], [place], [address] Phone: [phone]   Fax:  [phone]  Physical Therapy Evaluation  Patient Details  Name: Felicia Robinson MRN: [id number] Date of Birth: 1945/04/25 Referring Provider:  Steele Sizer, MD  Encounter Date: 08/23/2014      PT End of Session - 08/23/14 1538    Visit Number 1   Number of Visits 8   Date for PT Re-Evaluation 09/22/14   PT Start Time 1400   PT Stop Time 1515   PT Time Calculation (min) 75 min   Equipment Utilized During Treatment Gait belt   Activity Tolerance Patient tolerated treatment well;Other (comment)  Pt did not ask to stop examination, but did note she fatigued and noticed an increase in her LE pain over time,   Behavior During Therapy [facility] for tasks assessed/performed      Past Medical History  Diagnosis Date  . Hypertension 2009  . Gout 2013  . Shingles 2013  . Disorders of phosphorus metabolism     rickets  . Fibroadenosis of breast   . Osteomalacia   . Cancer 2013    L breast  . Malignant neoplasm of upper-outer quadrant of female breast 09/30/2011    7 mm low-grade invasive mammary carcinoma with focal DCIS. ER 90%, PR 1%, HER-2/neu not overexpressing.    Past Surgical History  Procedure Laterality Date  . Mastectomy Left 2013    with SN biopsy  . Colonoscopy  2012  . Upper gi endoscopy  2012  . Tonsillectomy  1953  . Dilation and curettage of uterus  1982  . Breast surgery Left 09/30/2011    Left simple mastectomy with sentinel node biopsy    There were no vitals filed for this visit.  Visit Diagnosis:  Difficulty walking - Plan: PT plan of care cert/re-cert  Multiple falls - Plan: PT plan of care cert/re-cert      Subjective Assessment - 08/23/14 1355    Subjective Pt reports a lifelong history of rickets, which causes her to have episodes of her "joints locking up". She has had 3 falls in the last year, none  resulting in injury. She has had several near falls with the cane in public, but has not fallen with the cane. She has started using the cane in her RUE for the past 8-12 months. Pt states she has had trouble ascending and descending steps recently.    Limitations Sitting   How long can you sit comfortably? Varies, but she finds herself stiffening up.    Patient Stated Goals Pt would like to become more active and join [employer] for several reasons including: to lower her blood pressure, to decrease her falls risk,             [facility] PT Assessment - 08/23/14 0001    Assessment   Medical Diagnosis Ricket's disease   Onset Date --  Lifespan   Precautions   Precautions Fall   Restrictions   Weight Bearing Restrictions No   Balance Screen   Has the patient fallen in the past 6 months Yes   How many times? 3    Has the patient had a decrease in activity level because of a fear of falling?  Yes   Is the patient reluctant to leave their home because of a fear of falling?  No   Home Environment   Living Enviornment Private residence   Living Arrangements Spouse/significant other  Available Help at Discharge Family   Type of [place] to enter   Entrance Stairs-Number of Steps 8   Entrance Stairs-Rails None  Narrow walls   Home Layout One level   [place] - single point   Cognition   Overall Cognitive Status Within Functional Limits for tasks assessed   Sensation   Light Touch Appears Intact   Coordination   Gross Motor Movements are Fluid and Coordinated Yes   Squat   Comments Appropriate technique (no valgus), unable to achieve femur level parallel to floor secondary to bilateral hip pain.    Single Leg Stance   Comments WNL bilaterally    Sit to Stand   Comments 5 x sit to stand - 13.46 seconds  Indicating she is mildly slower than her age norm.   Posture/Postural Control   Posture/Postural Control No significant limitations   PROM    Overall PROM Comments Hip IR bilaterally > 75% reduction, FABER - non painful, knees ~45 degrees above horizontal, Ankle PF - WNL, DF - lacks 2 degrees from neutral on RLE, lacks 5 degrees from neutral on LLE.    Strength   Overall Strength Comments Hip flexors 4/5 and fatigue bilaterally, knee extensor, knee flexor, PF, DF, and hip abductor 5/5 bilaterally    Flexibility   Hamstrings 90-90 test WNL bilaterally   Piriformis Limited and tender at end ranges bilaterally, reproducing "her pain"   Ambulation/Gait   Ambulation/Gait Yes   Ambulation/Gait Assistance 5: Supervision   Assistive device Straight cane   Ambulation Surface Level   Gait velocity --  0.76 m/s with cane, 0.81 m/s without cane.    Stairs Assistance 5: Supervision  Rail (unilateral)   Number of Stairs 12   6 Minute Walk- Baseline   6 Minute Walk- Baseline yes  795 feet   Berg Balance Test   Sit to Stand Able to stand without using hands and stabilize independently   Standing Unsupported Able to stand safely 2 minutes   Sitting with Back Unsupported but Feet Supported on Floor or Stool Able to sit safely and securely 2 minutes   Stand to Sit Sits safely with minimal use of hands   Transfers Able to transfer safely, minor use of hands   Standing Unsupported with Eyes Closed Able to stand 10 seconds safely   Standing Ubsupported with Feet Together Able to place feet together independently and stand 1 minute safely   From Standing, Reach Forward with Outstretched Arm Can reach forward >12 cm safely (5")   From Standing Position, Pick up Object from Floor Able to pick up shoe safely and easily   From Standing Position, Turn to Look Behind Over each Shoulder Looks behind from both sides and weight shifts well   Turn 360 Degrees Able to turn 360 degrees safely in 4 seconds or less   Standing Unsupported, Alternately Place Feet on Step/Stool Able to stand independently and safely and complete 8 steps in 20 seconds   Standing  Unsupported, One Foot in Front Able to place foot tandem independently and hold 30 seconds   Standing on One Leg Able to lift leg independently and hold > 10 seconds   Total Score 55   Dynamic Gait Index   Level Surface Normal   Change in Gait Speed Normal   Gait with Horizontal Head Turns Mild Impairment   Gait with Vertical Head Turns Mild Impairment   Gait and Pivot Turn Normal  Step Over Obstacle Normal   Step Around Obstacles Normal   Steps Mild Impairment   Total Score 21   Timed Up and Go Test   TUG Normal TUG   Normal TUG (seconds) --  14.36 with cane, 13.81 without cane                            PT Education - 08/23/14 1537    Education provided Yes   Education Details Patient educated on device choice, how to ambulate around obstacles, sit to stand technique, piriformis stretching, and lateral band walk.   Person(s) Educated Patient   Methods Explanation;Demonstration;Verbal cues;Handout   Comprehension Verbalized understanding;Returned demonstration             PT Long Term Goals - 08/23/14 1558    PT LONG TERM GOAL #1   Title Patient will be independent with a home exercise program to improve strength, power, and balance to improve safety and tolerance for dynamic mobility by 09/22/2014.   Status New   PT LONG TERM GOAL #2   Title Patient will decrease TUG score by at least 3 seconds to demonstrate a decreased risk of falling during dynamic mobility activities by 09/22/2014.   Status New   PT LONG TERM GOAL #3   Title Patient will decrease 5 time sit to stand time by at least 2 seconds to demonstrate improved strength and decreased risk of falling with dynamic mobility by 09/22/2014.   Status New   PT LONG TERM GOAL #4   Title Patient will demonstrate a full mobility squat with no knee valgus to pick up an object from the floor to demonstrate increased strength, mobility, and balance during dynamic mobility by 09/22/2014.   Status New   PT LONG  TERM GOAL #5   Title Patient will ascend and descend at least 8 steps with supervision assist with reciprocal gait pattern and no hand rail assistance to increase her safety with mobility by 09/22/2014.   Status New   Additional Long Term Goals   Additional Long Term Goals Yes   PT LONG TERM GOAL #6   Title Patient will increase her gait speed by at least 0.3 m/s to increase her safety with community mobility by 09/22/2014.    Status New               Plan - 08/23/14 1542    Clinical Impression Statement Pt is a 69 y/o female that presents following 3 falls in the past year, and has recently started to use a cane with several near falls. Pt has Ricket's disease, which has decreased her flexibility in her ankles and hips and increased her pain with prolonged mobility. Today patient presents with balance deficits and decreased ambulatory speed, both with and without an assistive device. Pt is unable to ascend steps without a railing, and has been using her walls at home to ascend her steps. Given her decreased bone mineral density and falls risk, pt would benefit from skilled PT services to address her ROM, endurance, and balance deficits.   Pt will benefit from skilled therapeutic intervention in order to improve on the following deficits Abnormal gait;Difficulty walking;Decreased strength;Decreased balance;Decreased endurance;Pain   Rehab Potential Good   Clinical Impairments Affecting Rehab Potential Lifelong condition (Ricket's)    PT Frequency 2x / week   PT Duration 4 weeks   PT Treatment/Interventions ADLs/Self Care Home Management;Moist Heat;Cryotherapy;Balance training;Therapeutic exercise;Manual techniques;Neuromuscular re-education;Gait training;Stair training  PT Next Visit Plan Pt will be assessed for HEP, additional manual therapy and stretching for hips/ankle mobility, attempt higher level balance measure to account for cieling effects of current tests.    PT Home Exercise Plan  Provided as above (piriformis stretching, sit to stand for speed, lateral band walking).    Consulted and Agree with Plan of Care Patient          G-Codes - 09/16/2014 1603    Functional Assessment Tool Used TUG, 5 time sit to stand, BERG, 6 minute walk test, 80mgait speed, DGI   Functional Limitation Mobility: Walking and moving around   Mobility: Walking and Moving Around Current Status [phone] At least 1 percent but less than 20 percent impaired, limited or restricted   Mobility: Walking and Moving Around Goal Status [phone] impaired, limited or restricted       Problem List Patient Active Problem List   Diagnosis Date Noted  . Breast cancer 09/08/2012  . H/O rickets 09/08/2012   PKerman Passey PT, DPT   [place] [facility] [address]Phone: [phone]  Fax:  [phone]

## 2014-08-23 NOTE — Patient Instructions (Signed)
Piriformis Stretch (Supine)   Grasp sound limb ankle and knee. Pull equally toward opposite shoulder. Hold _3___ seconds. Repeat __10__ times. Do __2-3__ sessions per day.  Copyright  VHI. All rights reserved.  Functional Quadriceps: Sit to Stand   Sit on edge of chair, feet flat on floor. Stand upright, extending knees fully. Repeat _12-15___ times per set. Do __2__ sets per session. Do __1__ sessions per day.  http://orth.exer.us/735   Copyright  VHI. All rights reserved.  Band Walk: Side Stepping   Tie band around legs, just above knees. Step _10-15__ feet to one side, then step back to start. Repeat 3 times per side. Note: Small towel between band and skin eases rubbing.  http://plyo.exer.us/76   Copyright  VHI. All rights reserved.

## 2014-08-28 ENCOUNTER — Encounter: Payer: Self-pay | Admitting: Physical Therapy

## 2014-08-28 ENCOUNTER — Ambulatory Visit: Payer: Medicare Other | Admitting: Physical Therapy

## 2014-08-28 DIAGNOSIS — R2689 Other abnormalities of gait and mobility: Secondary | ICD-10-CM | POA: Diagnosis not present

## 2014-08-28 DIAGNOSIS — R296 Repeated falls: Secondary | ICD-10-CM

## 2014-08-28 DIAGNOSIS — R262 Difficulty in walking, not elsewhere classified: Secondary | ICD-10-CM

## 2014-08-28 NOTE — Therapy (Signed)
[place] MAIN [facility] SERVICES [address] [place], [place], [address] Phone: [phone]   Fax:  [phone]  Physical Therapy Treatment  Patient Details  Name: Felicia Robinson MRN: [id number] Date of Birth: Jul 07, 1945 Referring Provider:  Steele Sizer, MD  Encounter Date: 08/28/2014      PT End of Session - 08/28/14 1632    Visit Number 2   Number of Visits 8   Date for PT Re-Evaluation 09/22/14   PT Start Time 5102   PT Stop Time 1605   PT Time Calculation (min) 42 min   Equipment Utilized During Treatment Gait belt   Activity Tolerance Patient tolerated treatment well  Patient stated she didn't feel nearly as stiff after session today.    Behavior During Therapy [facility] for tasks assessed/performed      Past Medical History  Diagnosis Date  . Hypertension 2009  . Gout 2013  . Shingles 2013  . Disorders of phosphorus metabolism     rickets  . Fibroadenosis of breast   . Osteomalacia   . Cancer 2013    L breast  . Malignant neoplasm of upper-outer quadrant of female breast 09/30/2011    7 mm low-grade invasive mammary carcinoma with focal DCIS. ER 90%, PR 1%, HER-2/neu not overexpressing.    Past Surgical History  Procedure Laterality Date  . Mastectomy Left 2013    with SN biopsy  . Colonoscopy  2012  . Upper gi endoscopy  2012  . Tonsillectomy  1953  . Dilation and curettage of uterus  1982  . Breast surgery Left 09/30/2011    Left simple mastectomy with sentinel node biopsy    There were no vitals filed for this visit.  Visit Diagnosis:  Difficulty walking  Multiple falls      Subjective Assessment - 08/28/14 1542    Subjective Patient reports she has been diligent with her HEP, which has been helpful though she still maintains multiple episodes of stiffening up. She does not state it is one joint more than another, but seems to be generalized stiffness. Patient reports that she stood up at a book fair for a  long while on Friday, and had some increase in pain and did not tolerate as well as she would like to.    Limitations Sitting   Patient Stated Goals Pt would like to become more active and join [place] for several reasons including: to lower her blood pressure, to decrease her falls risk,    Currently in Pain? No/denies  [place] feeling stiffness.     Ther-Ex  Walking on treadmill at 0.8 mph for 3 minutes without HHA, cuing for increased stride length.  Step ups to 4" step without hand hold assistance x 15 bilaterally, with cuing for not having lateral trunk deviations.  Step downs from 4" step  without hand hold assistance x 15 bilaterally, with cuing for controlling her descent.  Calf stretch x 10 bilaterally against the wall with 3 second holds, with cuing for heel remaining on the ground.  Squat training with bilateral hand held assistance x 10, with cuing for ER at the feet, wider foot placement, and allowing for some anterior knee displacement as opposed to a vertical tibia.  Stair training 4 steps x 3, with unilateral hand hold assistance on descent with cuing for side stepping instead of facing forward.  Hip flexor stretching in standing x 10 repetitions with 3 second hold.  Attempted leg press, she was unable to  tolerate as this was uncomfortable.                            PT Education - 08/28/14 1631    Education provided Yes   Education Details Patient educated on stretching and home exercise program for maintenance when she feels stiff throughout the day.    Person(s) Educated Patient   Methods Explanation;Demonstration;Verbal cues;Handout   Comprehension Verbalized understanding;Returned demonstration             PT Long Term Goals - 08/28/14 1633    PT LONG TERM GOAL #1   Status Partially Met               Plan - 08/28/14 1635    Clinical Impression Statement Patient states she is feeling progress already, though she continues  to complain of stiffness globally. Patient displays decreased strength on LLE compared to RLE. Patient responded well to squatting mechanics cuing, and was able to increase the depth she could comfortably squat to with hand hold assistance. Patient would benefit from continued PT services to address her stiffness and strength/balance deficits.    Pt will benefit from skilled therapeutic intervention in order to improve on the following deficits Abnormal gait;Difficulty walking;Decreased strength;Decreased balance;Decreased endurance;Pain   Rehab Potential Good   Clinical Impairments Affecting Rehab Potential Lifelong condition (Ricket's)    PT Frequency 2x / week   PT Duration 4 weeks   PT Treatment/Interventions ADLs/Self Care Home Management;Moist Heat;Cryotherapy;Balance training;Therapeutic exercise;Manual techniques;Neuromuscular re-education;Gait training;Stair training   PT Next Visit Plan Follow up on stretching program and ask about how the maintenance program is going. Consider adding additional strengthening exercises and higher level balance activities.    PT Home Exercise Plan Added calf and hip flexor stretching to current HEP.    Consulted and Agree with Plan of Care Patient        Problem List Patient Active Problem List   Diagnosis Date Noted  . Breast cancer 09/08/2012  . H/O rickets 09/08/2012    Kerman Passey, PT, DPT    08/28/2014, 4:40 PM  [place] MAIN [facility] SERVICES [address] [place], [place], [address] Phone: [phone]   Fax:  [phone]

## 2014-08-28 NOTE — Patient Instructions (Signed)
Soleus Stretch - Standing   Stand with uninvolved leg behind, slightly bent, heel on floor. Lean into wall until stretch is felt in calf. Hold for __3_ seconds. Repeat on involved leg. Repeat __10_ times. Do as needed.   Copyright  VHI. All rights reserved.  Hip Flexor Stretch: Lunge Pose (Wall, Block)   Position block so shin is vertical to heel. Pelvic tilt engaged, reach into back leg, press front knee into block. Hold for _3___ breaths. Repeat _10___ times each leg.  Copyright  VHI. All rights reserved.

## 2014-08-30 ENCOUNTER — Encounter: Payer: Medicare Other | Admitting: Physical Therapy

## 2014-09-06 ENCOUNTER — Ambulatory Visit: Payer: Medicare Other | Admitting: Physical Therapy

## 2014-09-07 ENCOUNTER — Ambulatory Visit: Payer: Self-pay | Admitting: General Surgery

## 2014-09-11 ENCOUNTER — Encounter: Payer: Self-pay | Admitting: Physical Therapy

## 2014-09-11 ENCOUNTER — Ambulatory Visit: Payer: Medicare Other | Admitting: Physical Therapy

## 2014-09-11 DIAGNOSIS — R262 Difficulty in walking, not elsewhere classified: Secondary | ICD-10-CM

## 2014-09-11 DIAGNOSIS — R2689 Other abnormalities of gait and mobility: Secondary | ICD-10-CM | POA: Diagnosis not present

## 2014-09-11 DIAGNOSIS — R296 Repeated falls: Secondary | ICD-10-CM

## 2014-09-11 NOTE — Therapy (Signed)
[place] MAIN [facility] SERVICES [address] [place], [place], [address] Phone: [phone]   Fax:  [phone]  Physical Therapy Treatment  Patient Details  Name: Felicia Robinson MRN: [id number] Date of Birth: 10-29-1945 Referring Provider:  Steele Sizer, MD  Encounter Date: 09/11/2014      PT End of Session - 09/11/14 1711    Visit Number 3   Number of Visits 8   Date for PT Re-Evaluation 09/22/14   PT Start Time 1522   PT Stop Time 1607   PT Time Calculation (min) 45 min   Equipment Utilized During Treatment Gait belt   Activity Tolerance Patient tolerated treatment well   Behavior During Therapy [facility] for tasks assessed/performed      Past Medical History  Diagnosis Date  . Hypertension 2009  . Gout 2013  . Shingles 2013  . Disorders of phosphorus metabolism     rickets  . Fibroadenosis of breast   . Osteomalacia   . Cancer 2013    L breast  . Malignant neoplasm of upper-outer quadrant of female breast 09/30/2011    7 mm low-grade invasive mammary carcinoma with focal DCIS. ER 90%, PR 1%, HER-2/neu not overexpressing.    Past Surgical History  Procedure Laterality Date  . Mastectomy Left 2013    with SN biopsy  . Colonoscopy  2012  . Upper gi endoscopy  2012  . Tonsillectomy  1953  . Dilation and curettage of uterus  1982  . Breast surgery Left 09/30/2011    Left simple mastectomy with sentinel node biopsy    There were no vitals filed for this visit.  Visit Diagnosis:  Difficulty walking  Multiple falls      Subjective Assessment - 09/11/14 1524    Subjective Patient reports she has been very good about exercise program and is up to the 15 repetition limit with the green band for her standing hip abductions. She reports she is able to perform work around [employer] and ambulate without the cane more and more throughout the house. She still describes some stiffness throughout the day.    Limitations  Sitting;Standing;Walking   How long can you sit comfortably? Varies, but she finds herself stiffening up.    Patient Stated Goals Pt would like to become more active and join [employer] for several reasons including: to lower her blood pressure, to decrease her falls risk,    Currently in Pain? No/denies      There-Ex   4 minutes on treadmill at 0.8 mph (unbilled)  Ankle wall stretch cuing for rocking forward through her lower body and to keep her heel on the ground.  Hip flexion stretch x 10 bilaterally, cuing to allow bottom of heel to come off the ground TRX Squats x 10, with cuing for some anterior knee displacement.  Suitcase carry x 8 total with 6# weight  (for 30' each) Cuing for contralateral arm swing and level shoulders.  Standing quad stretch x 10 bilaterally with cuing for sheet around ankles and to mobilize 3 seconds on, 3 seconds off.  Monster walk with yellow band, tolerated but unbalanced. Monster walk without band much better tolerated. 1x forward and retro with band x 10', 1 without band.  Red theraband rows (regular and tandem stance) x 10 with regular stance, x 10 with tandem stance.  Attempted leg press, still not enough mobility in her hips for this  Marching gait with 3 second pause in single leg  stance x 10' for 4 repetitions.                                PT Long Term Goals - 09/11/14 1715    PT LONG TERM GOAL #1   Title Patient will be independent with a home exercise program to improve strength, power, and balance to improve safety and tolerance for dynamic mobility by 09/22/2014.   Status Achieved   PT LONG TERM GOAL #2   Title Patient will decrease TUG score by at least 3 seconds to demonstrate a decreased risk of falling during dynamic mobility activities by 09/22/2014.   Status On-going   PT LONG TERM GOAL #3   Title Patient will decrease 5 time sit to stand time by at least 2 seconds to demonstrate improved strength and  decreased risk of falling with dynamic mobility by 09/22/2014.   Status On-going   PT LONG TERM GOAL #4   Title Patient will demonstrate a full mobility squat with no knee valgus to pick up an object from the floor to demonstrate increased strength, mobility, and balance during dynamic mobility by 09/22/2014.   Status Partially Met   PT LONG TERM GOAL #5   Title Patient will ascend and descend at least 8 steps with supervision assist with reciprocal gait pattern and no hand rail assistance to increase her safety with mobility by 09/22/2014.   Status On-going   PT LONG TERM GOAL #6   Title Patient will increase her gait speed by at least 0.3 m/s to increase her safety with community mobility by 09/22/2014.    Status On-going               Plan - 09/11/14 1712    Clinical Impression Statement Patient demonstrates improved balance via single leg stance marching gait with minimal loss of balance and self-report. She continues to have some difficulty with stiffness, though even this is dissipating and with stretching provided in her session today this was significantly reduced. Patient was able to be very active throughout [place], and appears to be making steady progress towards goals. Skilled PT services are indicated to address her residual balance deficits and stiffness.    Pt will benefit from skilled therapeutic intervention in order to improve on the following deficits Abnormal gait;Difficulty walking;Decreased strength;Decreased balance;Decreased endurance;Pain   Rehab Potential Good   Clinical Impairments Affecting Rehab Potential Lifelong condition (Ricket's)    PT Frequency 2x / week   PT Duration 4 weeks   PT Treatment/Interventions ADLs/Self Care Home Management;Moist Heat;Cryotherapy;Balance training;Therapeutic exercise;Manual techniques;Neuromuscular re-education;Gait training;Stair training   PT Next Visit Plan Assess HEP, progress as tolerated. Re-assess for balance outcome  measures. Add in increased strengthening as tolerated.    PT Home Exercise Plan See patient instructions         Problem List Patient Active Problem List   Diagnosis Date Noted  . Breast cancer 09/08/2012  . H/O rickets 09/08/2012    Kerman Passey, PT, DPT   09/11/2014, 5:16 PM  [place] MAIN [facility] SERVICES [address] [place], [place], [address] Phone: [phone]   Fax:  [phone]

## 2014-09-11 NOTE — Patient Instructions (Addendum)
All exercises provided were adapted from [email or web address]. Patient was provided a written handout with pictures as described. Any additional cues were manually entered in to handout and copied in to this document.   Marching in Place  March slowly, with high knees. Pause 3 seconds at the top in single leg balance.    Gastrocnemius/Soleus Stretch  Keep back leg straight and heel on floor, lean into wall until a stretch is felt in calf.  Hold for 30 seconds.  Next, keep back leg slightly bent and heel flat on floor.  Lean into wall until stretch is felt in calf.  Hold for 30 seconds.  Repeat on other leg.   ELASTIC BAND FORWARD WALKS - MONSTER WALK  With an elastic band around both ankles, walk forward while keeping your feet spread apart. Keep your knees bent the entire time.    STANDING QUAD STRETCH WITH BELT  Stand with hand on a nearby table or chair to assure balance. Place a belt or long towel around ankle while holding the ends of it in the hand opposite the table (Picture 1).  Pull upward on the belt causing the knee to bend and a stretch to be felt in the front of the thigh (Picture 2).   Hip Flexor Stretch  Lean against wall. Back leg straight but lift heel up off floor. Bend front leg. Lean forward until you feel stretch in front of hip of straight leg.   [place]  Tie a knot in the elastic band and close the knot in the door jam. Perform this exercise with the elastic band at elbow level.  Hold an elastic band with both arms in front of you. Next, pull the band back towards your side as you bend your elbows.

## 2014-09-13 ENCOUNTER — Encounter: Payer: Self-pay | Admitting: Physical Therapy

## 2014-09-13 ENCOUNTER — Ambulatory Visit: Payer: Medicare Other | Admitting: Physical Therapy

## 2014-09-13 DIAGNOSIS — R262 Difficulty in walking, not elsewhere classified: Secondary | ICD-10-CM

## 2014-09-13 DIAGNOSIS — R2689 Other abnormalities of gait and mobility: Secondary | ICD-10-CM | POA: Diagnosis not present

## 2014-09-13 DIAGNOSIS — R296 Repeated falls: Secondary | ICD-10-CM

## 2014-09-13 NOTE — Therapy (Signed)
[place] MAIN [facility] SERVICES [address] [place], [place], [address] Phone: [phone]   Fax:  [phone]  Physical Therapy Treatment  Patient Details  Name: Felicia Robinson MRN: [id number] Date of Birth: May 17, 1945 Referring Provider:  Steele Sizer, MD  Encounter Date: 09/13/2014      PT End of Session - 09/13/14 1619    Visit Number 4   Number of Visits 8   Date for PT Re-Evaluation 09/22/14   PT Start Time 1520   PT Stop Time 1608   PT Time Calculation (min) 48 min   Equipment Utilized During Treatment Gait belt   Activity Tolerance Patient tolerated treatment well   Behavior During Therapy [facility] for tasks assessed/performed      Past Medical History  Diagnosis Date  . Hypertension 2009  . Gout 2013  . Shingles 2013  . Disorders of phosphorus metabolism     rickets  . Fibroadenosis of breast   . Osteomalacia   . Cancer 2013    L breast  . Malignant neoplasm of upper-outer quadrant of female breast 09/30/2011    7 mm low-grade invasive mammary carcinoma with focal DCIS. ER 90%, PR 1%, HER-2/neu not overexpressing.    Past Surgical History  Procedure Laterality Date  . Mastectomy Left 2013    with SN biopsy  . Colonoscopy  2012  . Upper gi endoscopy  2012  . Tonsillectomy  1953  . Dilation and curettage of uterus  1982  . Breast surgery Left 09/30/2011    Left simple mastectomy with sentinel node biopsy    There were no vitals filed for this visit.  Visit Diagnosis:  Difficulty walking  Multiple falls      Subjective Assessment - 09/13/14 1531    Subjective Patient reports she has been stretching diligently. She states she had some pain after her session on Monday, but has found relief with icing. She states she expected it as she hasn't been using many of her strength and postural muscles for the better part of the last year.    Limitations Sitting;Standing;Walking   How long can you sit comfortably?  Varies, but she finds herself stiffening up.    Patient Stated Goals Pt would like to become more active and join [employer] for several reasons including: to lower her blood pressure, to decrease her falls risk,       There-Ex  Standing wall calf stretch x 10 bilaterally  Standing wall hip flexor stretch x 10 bilaterally  Standing quadricep stretch x 10 bilaterally  [place]ion with 2# weight bilaterally x 12 repetitons (sufficient fatigue)  Low rows with red theraband x 12 repetitions  NM Re-education 5x sit to stand - 9.21 seconds    (no cane) TUG - 10.15 seconds    (no cane) 33m walk speed - 1.04 m/s (no cane) Single leg balance on blue foam x 3 bilaterally for 2 sets. Third set with eyes closed, had mild loss of balance bilaterally x 4 repetitions. Mid rows with red theraband x 12 repetitions in tandem stance.  Step Ups with unilateral hand support to 6" step x 12 bilaterally emphasizing quiet landings for balance in single leg stance on ascending steps  On blue foam in [place]om[place] stance x 1 with eyes open, 30 seconds no loss of balance.  On blue foam in [place]omberg stance x 1 with eyes closed, 30 seconds no loss of balance.  On blue foam marching with eyes open x 30 seconds  and no loss of balance  On blue foam marching with eyes closed x 2 for 20" with mild loss of balance intermittently.   Gait Training  Backwards ambulation x 43m for 2 sets, working on hip flexion stretching and gluteal strengthening  [place]vine ambulation x 10' bilaterally, cuing for prolonged single leg stance during gait                                  PT Education - 09/13/14 1626    Education provided Yes   Education Details Patient educated on her progress and how she has met many of her PT goals.    Person(s) Educated Patient   Methods Explanation;Demonstration;Tactile cues;Verbal cues;Handout   Comprehension Verbalized understanding;Returned demonstration              PT Long Term Goals - 09/13/14 1618    PT LONG TERM GOAL #1   Title Patient will be independent with a home exercise program to improve strength, power, and balance to improve safety and tolerance for dynamic mobility by 09/22/2014.   Status Achieved   PT LONG TERM GOAL #2   Title Patient will decrease TUG score by at least 3 seconds to demonstrate a decreased risk of falling during dynamic mobility activities by 09/22/2014.   Status Achieved   PT LONG TERM GOAL #3   Title Patient will decrease 5 time sit to stand time by at least 2 seconds to demonstrate improved strength and decreased risk of falling with dynamic mobility by 09/22/2014.   Status Achieved   PT LONG TERM GOAL #4   Title Patient will demonstrate a full mobility squat with no knee valgus to pick up an object from the floor to demonstrate increased strength, mobility, and balance during dynamic mobility by 09/22/2014.   Status Partially Met   PT LONG TERM GOAL #5   Title Patient will ascend and descend at least 8 steps with supervision assist with reciprocal gait pattern and no hand rail assistance to increase her safety with mobility by 09/22/2014.   Status On-going   PT LONG TERM GOAL #6   Title Patient will increase her gait speed by at least 0.3 m/s to increase her safety with community mobility by 09/22/2014.    Status Achieved               Plan - 09/13/14 1627    Clinical Impression Statement Patient demostates significant decreases in her 5x sit to stand and TUG balance times and a dramatic increase in her 23m walk speed today indicating she is making significant improvements in her safety with dynamic balance. She is no longer considered a high falls risk, and completed all tests without a cane. Patient displays mild LE weakness and single leg balance deficits with eyes closed, which were given as HEP. Patient will continue to benefit from skilled PT services to increase her safety and independence with mobility.    Pt will  benefit from skilled therapeutic intervention in order to improve on the following deficits Abnormal gait;Difficulty walking;Decreased strength;Decreased balance;Decreased endurance;Pain   Rehab Potential Good   Clinical Impairments Affecting Rehab Potential Lifelong condition (Ricket's)    PT Frequency 2x / week   PT Duration 4 weeks   PT Treatment/Interventions ADLs/Self Care Home Management;Moist Heat;Cryotherapy;Balance training;Therapeutic exercise;Manual techniques;Neuromuscular re-education;Gait training;Stair training   PT Next Visit Plan Re-assess 6 MW, stair training, prepare for discharge and look in to heated  pools/group exercise programs.    PT Home Exercise Plan See patient instructions    Consulted and Agree with Plan of Care Patient        Problem List Patient Active Problem List   Diagnosis Date Noted  . Breast cancer 09/08/2012  . H/O rickets 09/08/2012   Kerman Passey, PT, DPT   09/13/2014, 4:35 PM  [place] MAIN  [facility] SERVICES [address] [place], [place], [address] Phone: [phone]   Fax:  [phone]

## 2014-09-13 NOTE — Patient Instructions (Signed)
All exercises provided were adapted from [email or web address]. Patient was provided a written handout with pictures as described. Any additional cues were manually entered in to handout and copied in to this document.   SHOULDER SCAPTION  Standing with the involved arm to the side, hold thumb up and elevate arm to shoulder height at a 45 degree angle (halfway between hip and navel).  Pause, then slowly lower arm to starting position.  Repeat.     SINGLE LEG STANCE - SLS  Stand on one leg and maintain your balance.    Shoulder Extension with Theraband  Start with a little tension on the thera band, arms straight, and about 45 degrees out from your body. Keeping arms straight, bring arms back and down towards your sides. Return to start.

## 2014-09-19 ENCOUNTER — Ambulatory Visit (INDEPENDENT_AMBULATORY_CARE_PROVIDER_SITE_OTHER): Payer: Medicare Other | Admitting: General Surgery

## 2014-09-19 ENCOUNTER — Encounter: Payer: Self-pay | Admitting: General Surgery

## 2014-09-19 VITALS — BP 148/72 | HR 72 | Resp 12 | Ht <= 58 in | Wt 129.0 lb

## 2014-09-19 DIAGNOSIS — C50912 Malignant neoplasm of unspecified site of left female breast: Secondary | ICD-10-CM

## 2014-09-19 NOTE — Progress Notes (Signed)
Patient ID: Felicia Robinson, female   DOB: 21-Oct-1945, 69 y.o.   MRN: [id number]  Chief Complaint  Patient presents with  . Follow-up    mammogram    HPI Felicia Robinson is a 69 y.o. female. who presents for her follow up mammogram and breast evaluation. The most recent mammogram was done on 09-08-14.  Patient does perform regular self breast checks and gets regular mammograms done.  No new breast issues.  She has had recent falls due to leg weakness from rickets. She has been going to physical therapy and she is using a cane today.  HPI  Past Medical History  Diagnosis Date  . Hypertension 2009  . Gout 2013  . Shingles 2013  . Disorders of phosphorus metabolism     rickets  . Fibroadenosis of breast   . Osteomalacia   . Cancer 2013    L breast  . Malignant neoplasm of upper-outer quadrant of female breast 09/30/2011    7 mm low-grade invasive mammary carcinoma with focal DCIS. ER 90%, PR 1%, HER-2/neu not overexpressing.    Past Surgical History  Procedure Laterality Date  . Mastectomy Left 2013    with SN biopsy  . Colonoscopy  2012  . Upper gi endoscopy  2012  . Tonsillectomy  1953  . Dilation and curettage of uterus  1982  . Breast surgery Left 09/30/2011    Left simple mastectomy with sentinel node biopsy    Family History  Problem Relation Age of Onset  . Lymphoma Brother 87    Hodgkin's Lymphoma  . Cancer Maternal Uncle 62    cancer of the esophagus    Social History History  Substance Use Topics  . Smoking status: Former Smoker -- 0.50 packs/day for 30 years    Types: Cigarettes    Quit date: 04/21/2006  . Smokeless tobacco: Never Used  . Alcohol Use: Yes    Allergies  Allergen Reactions  . Codeine Nausea And Vomiting  . Other     Pain medications Altered mental status  . Shellfish Allergy Diarrhea and Nausea And Vomiting    Current Outpatient Prescriptions  Medication Sig Dispense Refill  . Omega-3 Fatty Acids (FISH OIL) 1000 MG CAPS Take  by mouth daily.    Marland Kitchen ALPRAZolam (XANAX) 0.5 MG tablet Take 60 mg by mouth daily.    Marland Kitchen aspirin 81 MG chewable tablet Chew 81 mg by mouth daily.    . calcium carbonate (OS-CAL) 600 MG TABS Take 600 mg by mouth daily.    Marland Kitchen losartan (COZAAR) 100 MG tablet Take 100 mg by mouth daily.    . Multiple Vitamins-Minerals (CENTRUM SILVER ADULT 50+ PO) Take by mouth.    . nebivolol (BYSTOLIC) 10 MG tablet Take 5 mg by mouth daily.     . tamoxifen (NOLVADEX) 10 MG tablet Take 1 tablet (10 mg total) by mouth daily. 90 tablet 3   No current facility-administered medications for this visit.    Review of Systems Review of Systems  Constitutional: Negative.   Respiratory: Negative.   Cardiovascular: Negative.     Blood pressure 148/72, pulse 72, resp. rate 12, height $RemoveBe'4\' 9"'PEaBZAHdb$  (1.448 m), weight 129 lb (58.514 kg).  Physical Exam Physical Exam  Constitutional: She is oriented to person, place, and time. She appears well-developed and well-nourished.  Neck: Neck supple.  Cardiovascular: Normal rate, regular rhythm and normal heart sounds.   Pulmonary/Chest: Effort normal and breath sounds normal. Right breast exhibits no inverted nipple, no mass,  no nipple discharge, no skin change and no tenderness.    Left mastectomy site well healed.  Lymphadenopathy:    She has no cervical adenopathy.    She has no axillary adenopathy.  Neurological: She is alert and oriented to person, place, and time.  Skin: Skin is warm and dry.    Data Reviewed Screening right breast mammogram dated 09/08/2014 was reviewed. No interval change. BI-RADS-1.  Assessment    Doing well now 3 years status post left mastectomy. Reasonable tolerance of tamoxifen therapy.    Plan    We'll plan for follow-up examination in one year with a repeat right breast screening mammogram will be obtained at that time.       PCP:  Frazier Butt 09/20/2014, 1:50 PM

## 2014-09-19 NOTE — Patient Instructions (Addendum)
Continue self breast exams. Call office for any new breast issues or concerns. Patient to have right diagnostic mammogram and follow up in one year.

## 2014-09-20 ENCOUNTER — Ambulatory Visit: Payer: Medicare Other | Attending: Family Medicine | Admitting: Physical Therapy

## 2014-09-20 DIAGNOSIS — E550 Rickets, active: Secondary | ICD-10-CM | POA: Diagnosis present

## 2014-09-20 DIAGNOSIS — R296 Repeated falls: Secondary | ICD-10-CM

## 2014-09-20 DIAGNOSIS — R262 Difficulty in walking, not elsewhere classified: Secondary | ICD-10-CM | POA: Insufficient documentation

## 2014-09-20 NOTE — Therapy (Signed)
[place] MAIN [facility] SERVICES [address] [place], [place], [address] Phone: [phone]   Fax:  [phone]  Physical Therapy Treatment/discharge summary  Patient Details  Name: Felicia Robinson MRN: [id number] Date of Birth: Feb 19, 1946 Referring Provider:  Steele Sizer, MD  Encounter Date: 09/20/2014      PT End of Session - 09/20/14 1642    Visit Number 5   Number of Visits 8   Date for PT Re-Evaluation 09/22/14   PT Start Time 1525   PT Stop Time 1555   PT Time Calculation (min) 30 min   Activity Tolerance Patient tolerated treatment well   Behavior During Therapy [facility] for tasks assessed/performed      Past Medical History  Diagnosis Date  . Hypertension 2009  . Gout 2013  . Shingles 2013  . Disorders of phosphorus metabolism     rickets  . Fibroadenosis of breast   . Osteomalacia   . Cancer 2013    L breast  . Malignant neoplasm of upper-outer quadrant of female breast 09/30/2011    7 mm low-grade invasive mammary carcinoma with focal DCIS. ER 90%, PR 1%, HER-2/neu not overexpressing.    Past Surgical History  Procedure Laterality Date  . Mastectomy Left 2013    with SN biopsy  . Colonoscopy  2012  . Upper gi endoscopy  2012  . Tonsillectomy  1953  . Dilation and curettage of uterus  1982  . Breast surgery Left 09/30/2011    Left simple mastectomy with sentinel node biopsy    There were no vitals filed for this visit.  Visit Diagnosis:  Difficulty walking  Multiple falls      Subjective Assessment - 09/20/14 1531    Subjective Patient reports doing well today; She reports that she rarely uses the cane when at home. She reports still using cane outside for safety. She denies any new falls; She reports HEP is going well;    Patient Stated Goals Pt would like to become more active and join [place] for several reasons including: to lower her blood pressure, to decrease her falls risk,    Currently in  Pain? No/denies            [facility] PT Assessment - 09/20/14 0001    6 minute walk test results    Aerobic Endurance Distance Walked 1000   Endurance additional comments with SPC; no rest breaks;         TREATMENT: Warm up on Nustep level 2 x4 min with subjective/history intake;  Educated patient in correct stair negotiation, (4 steps with 1 rail , forward ascending reciprocal, descending reciprocal and non-reciprocal) x3 reps with cues to increase forward lean during ascending and to step towards edge of step to shorten step length when descending; Instructed patient in 6 min walk test, see attached;  Educated patient about various silver sneakers program.  PHYSICAL THERAPY DISCHARGE SUMMARY  Visits from Start of Care: 5  Current functional level related to goals / functional outcomes: See above   Remaining deficits: Chronic knee discomfort, difficulty walking   Education / Equipment: HEP progression including silver sneakers program  Plan: Patient agrees to discharge.  Patient goals were met. Patient is being discharged due to meeting the stated rehab goals.  ?????                          PT Education - 09/20/14 1639    Education provided  Yes   Education Details Educated patient on HEP and ways to increase compliance; Educated patient about different silver sneakers programs. Also educated patient about stair negotiation and stair safety;   Person(s) Educated Patient   Methods Explanation;Demonstration;Tactile cues;Verbal cues   Comprehension Verbalized understanding;Returned demonstration;Verbal cues required;Tactile cues required             PT Long Term Goals - 16-Oct-2014 1533    PT LONG TERM GOAL #1   Title Patient will be independent with a home exercise program to improve strength, power, and balance to improve safety and tolerance for dynamic mobility by 09/22/2014.   Status Achieved   PT LONG TERM GOAL #2   Title Patient will decrease  TUG score by at least 3 seconds to demonstrate a decreased risk of falling during dynamic mobility activities by 09/22/2014.   Status Achieved   PT LONG TERM GOAL #3   Title Patient will decrease 5 time sit to stand time by at least 2 seconds to demonstrate improved strength and decreased risk of falling with dynamic mobility by 09/22/2014.   Status Achieved   PT LONG TERM GOAL #4   Title Patient will demonstrate a full mobility squat with no knee valgus to pick up an object from the floor to demonstrate increased strength, mobility, and balance during dynamic mobility by 09/22/2014.   Status Partially Met   PT LONG TERM GOAL #5   Title Patient will ascend and descend at least 8 steps with supervision assist with reciprocal gait pattern and no hand rail assistance to increase her safety with mobility by 09/22/2014.   Baseline requires rails descending for safety;   Status Partially Met   PT LONG TERM GOAL #6   Title Patient will increase her gait speed by at least 0.3 m/s to increase her safety with community mobility by 09/22/2014.    Status Achieved               Plan - 10/16/14 1644    Clinical Impression Statement Patient does demonstrate improved gait ability with increased endurance during 6 min walk test. Instructed patient in stair negotiation safety; patient continues to require rail assist especially during descending stairs. Using the rails improves safety awareness to reduce fall risk when knee buckles. Patient has met most goals. PT recommends discharge from therapy at this time due to improved mobility. Patient agreeable.   Pt will benefit from skilled therapeutic intervention in order to improve on the following deficits Abnormal gait;Difficulty walking;Decreased strength;Decreased balance;Decreased endurance;Pain   Rehab Potential Good   Clinical Impairments Affecting Rehab Potential Lifelong condition (Ricket's)    PT Frequency 2x / week   PT Duration 4 weeks   PT  Treatment/Interventions ADLs/Self Care Home Management;Moist Heat;Cryotherapy;Balance training;Therapeutic exercise;Manual techniques;Neuromuscular re-education;Gait training;Stair training   PT Next Visit Plan Re-assess 6 MW, stair training, prepare for discharge and look in to heated pools/group exercise programs.    PT Home Exercise Plan See patient instructions    Consulted and Agree with Plan of Care Patient          G-Codes - 2014-10-16 1750    Functional Assessment Tool Used 6 min walk, stair negotiation, clinical judgement   Functional Limitation Mobility: Walking and moving around   Mobility: Walking and Moving Around Goal Status [phone]) 0 percent impaired, limited or restricted   Mobility: Walking and Moving Around Discharge Status [phone]) At least 1 percent but less than 20 percent impaired, limited or restricted  Problem List Patient Active Problem List   Diagnosis Date Noted  . Breast cancer 09/08/2012  . H/O rickets 09/08/2012    Hopkins,Yosmar Ryker, PT, DPT 09/20/2014, 5:52 PM  [place] MAIN [facility] SERVICES [address] [place], [place], [address] Phone: [phone]   Fax:  [phone]

## 2015-01-05 ENCOUNTER — Telehealth: Payer: Self-pay | Admitting: Family Medicine

## 2015-01-05 ENCOUNTER — Other Ambulatory Visit: Payer: Self-pay | Admitting: Family Medicine

## 2015-01-05 MED ORDER — LOSARTAN POTASSIUM 100 MG PO TABS: 100.0000 mg | ORAL_TABLET | Freq: Every day | ORAL | Status: DC

## 2015-01-05 MED ORDER — NEBIVOLOL HCL 10 MG PO TABS: 5.0000 mg | ORAL_TABLET | Freq: Every day | ORAL | Status: DC

## 2015-01-05 NOTE — Telephone Encounter (Signed)
Patient requesting refill. 

## 2015-01-05 NOTE — Telephone Encounter (Signed)
Patient had to change her appointment date to 01-29-15. She will be completely out of Bystolic 10mg  and Losartan 100mg  by her appointment date. Requesting that the refills be sent to [facility]. Please call once complete

## 2015-01-15 ENCOUNTER — Ambulatory Visit: Payer: Self-pay | Admitting: Family Medicine

## 2015-01-23 ENCOUNTER — Ambulatory Visit: Payer: Self-pay | Admitting: Family Medicine

## 2015-01-29 ENCOUNTER — Ambulatory Visit (INDEPENDENT_AMBULATORY_CARE_PROVIDER_SITE_OTHER): Payer: Medicare Other | Admitting: Family Medicine

## 2015-01-29 ENCOUNTER — Encounter: Payer: Self-pay | Admitting: Family Medicine

## 2015-01-29 VITALS — BP 134/78 | HR 79 | Temp 97.8°F | Resp 16 | Ht <= 58 in | Wt 129.3 lb

## 2015-01-29 DIAGNOSIS — Z8639 Personal history of other endocrine, nutritional and metabolic disease: Secondary | ICD-10-CM | POA: Diagnosis not present

## 2015-01-29 DIAGNOSIS — Z13220 Encounter for screening for lipoid disorders: Secondary | ICD-10-CM

## 2015-01-29 DIAGNOSIS — R202 Paresthesia of skin: Secondary | ICD-10-CM | POA: Diagnosis not present

## 2015-01-29 DIAGNOSIS — I10 Essential (primary) hypertension: Secondary | ICD-10-CM | POA: Diagnosis not present

## 2015-01-29 DIAGNOSIS — M109 Gout, unspecified: Secondary | ICD-10-CM

## 2015-01-29 DIAGNOSIS — F4321 Adjustment disorder with depressed mood: Secondary | ICD-10-CM | POA: Insufficient documentation

## 2015-01-29 DIAGNOSIS — R2681 Unsteadiness on feet: Secondary | ICD-10-CM

## 2015-01-29 DIAGNOSIS — Z23 Encounter for immunization: Secondary | ICD-10-CM

## 2015-01-29 DIAGNOSIS — M419 Scoliosis, unspecified: Secondary | ICD-10-CM

## 2015-01-29 DIAGNOSIS — F411 Generalized anxiety disorder: Secondary | ICD-10-CM

## 2015-01-29 DIAGNOSIS — R296 Repeated falls: Secondary | ICD-10-CM | POA: Diagnosis not present

## 2015-01-29 DIAGNOSIS — M4120 Other idiopathic scoliosis, site unspecified: Secondary | ICD-10-CM

## 2015-01-29 MED ORDER — NEBIVOLOL HCL 10 MG PO TABS: 10.0000 mg | ORAL_TABLET | Freq: Every day | ORAL | Status: DC

## 2015-01-29 MED ORDER — COLCHICINE 0.6 MG PO TABS: 1.2000 mg | ORAL_TABLET | Freq: Every day | ORAL | Status: DC | PRN

## 2015-01-29 MED ORDER — ALPRAZOLAM 0.5 MG PO TABS: 60.0000 mg | ORAL_TABLET | Freq: Every day | ORAL | Status: DC

## 2015-01-29 MED ORDER — LOSARTAN POTASSIUM 100 MG PO TABS: 100.0000 mg | ORAL_TABLET | Freq: Every day | ORAL | Status: DC

## 2015-01-29 NOTE — Progress Notes (Signed)
Name: Felicia Robinson   MRN: [id number]    DOB: 21-Apr-1946   Date:01/29/2015       Progress Note  Subjective  Chief Complaint  Chief Complaint  Patient presents with  . Medication Refill    follow-up  . Hypertension  . Anxiety  . Foot Pain    some numbness and tingling and swollen feet(soemtimes)    HPI  HTN: she has hypertension and at home bp is controlled with medication, but also has white coat hypertension. She denies chest pain or SOB.   Gait instability/Recurrent Falls: she had PT and is doing better, only uses cane when not at home, and she is trying to pick up her feet when walking at home.  She has intermittent pain on hips and knees  GAD: taking alprazolam, she worries a lot, able to sleep and function with alprazolam and denies side effects  Paresthesia foot/pain: she noticed recurrent numbness/tingling and pain on mid foot. She has flat feet, and walks bear foot at home. She states symptoms started after taking Tamoxifen. Pain, discomfort is not constant  Controlled: no recent episodes. Doing well, takes colcrys prn only   Patient Active Problem List   Diagnosis Date Noted  . Hypertension, benign 01/29/2015  . Controlled gout 01/29/2015  . Disorder of phosphorus metabolism 01/29/2015  . GAD (generalized anxiety disorder) 01/29/2015  . Breast cancer ([place]) 09/08/2012  . H/O rickets 09/08/2012  . History of shingles 12/06/2011    Past Surgical History  Procedure Laterality Date  . Mastectomy Left 2013    with SN biopsy  . Colonoscopy  2012  . Upper gi endoscopy  2012  . Tonsillectomy  1953  . Dilation and curettage of uterus  1982  . Breast surgery Left 09/30/2011    Left simple mastectomy with sentinel node biopsy    Family History  Problem Relation Age of Onset  . Lymphoma Brother 31    Hodgkin's Lymphoma  . Cancer Maternal Uncle 24    cancer of the esophagus    Social History   Social History  . Marital Status: Married    Spouse Name: N/A  .  Number of Children: N/A  . Years of Education: N/A   Occupational History  . Not on file.   Social History Main Topics  . Smoking status: Former Smoker -- 0.50 packs/day for 30 years    Types: Cigarettes    Quit date: 04/21/2006  . Smokeless tobacco: Never Used  . Alcohol Use: Yes  . Drug Use: No  . Sexual Activity: Not on file   Other Topics Concern  . Not on file   Social History Narrative     Current outpatient prescriptions:  .  ferrous fumarate (HEMOCYTE - 106 MG FE) 325 (106 FE) MG TABS tablet, Take 1 tablet by mouth 2 (two) times daily., Disp: , Rfl:  .  ALPRAZolam (XANAX) 0.5 MG tablet, Take 120 tablets (60 mg total) by mouth daily., Disp: 60 tablet, Rfl: 2 .  aspirin 81 MG chewable tablet, Chew 81 mg by mouth daily., Disp: , Rfl:  .  calcium carbonate (OS-CAL) 600 MG TABS, Take 600 mg by mouth daily., Disp: , Rfl:  .  losartan (COZAAR) 100 MG tablet, Take 1 tablet (100 mg total) by mouth daily., Disp: 90 tablet, Rfl: 1 .  Multiple Vitamins-Minerals (CENTRUM SILVER ADULT 50+ PO), Take by mouth., Disp: , Rfl:  .  nebivolol (BYSTOLIC) 10 MG tablet, Take 1 tablet (10 mg total) by mouth  daily., Disp: 90 tablet, Rfl: 1 .  Omega-3 Fatty Acids (FISH OIL) 1000 MG CAPS, Take by mouth daily., Disp: , Rfl:  .  tamoxifen (NOLVADEX) 10 MG tablet, Take 1 tablet (10 mg total) by mouth daily., Disp: 90 tablet, Rfl: 3  Allergies  Allergen Reactions  . Codeine Nausea And Vomiting  . Other     Pain medications Altered mental status  . Shellfish Allergy Diarrhea and Nausea And Vomiting     ROS  Constitutional: Negative for fever or weight change.  Respiratory: Negative for cough and shortness of breath.   Cardiovascular: Negative for chest pain or palpitations.  Gastrointestinal: Negative for abdominal pain, no bowel changes.  Musculoskeletal Positive  for gait problem no joint swelling.  Skin: Negative for rash.  Neurological: Negative for dizziness or headache.  No other  specific complaints in a complete review of systems (except as listed in HPI above).  Objective  Filed Vitals:   01/29/15 1354  BP: 174/102  Pulse: 79  Temp: 97.8 F (36.6 C)  TempSrc: Oral  Resp: 16  Height: 4\' 9"  (1.448 m)  Weight: 129 lb 4.8 oz (58.65 kg)  SpO2: 95%    Body mass index is 27.97 kg/(m^2).  Physical Exam  Constitutional: Patient appears well-developed and well-nourished. No distress.  HEENT: head atraumatic, normocephalic, pupils equal and reactive to light, neck supple, throat within normal limits Cardiovascular: Normal rate, regular rhythm and normal heart sounds.  No murmur heard. No BLE edema. Pulmonary/Chest: Effort normal and breath sounds normal. No respiratory distress. Abdominal: Soft.  There is no tenderness. Psychiatric: Patient has a normal mood and affect. behavior is normal. Judgment and thought content normal. Muscular Skeletal: she has an antalgic gait, uses a cane, flat feet, normal sensation on her feet  PHQ2/9: Depression screen PHQ 2/9 01/29/2015  Decreased Interest 0  Down, Depressed, Hopeless 0  PHQ - 2 Score 0     Fall Risk: Fall Risk  01/29/2015  Falls in the past year? Yes  Number falls in past yr: 2 or more  Injury with Fall? No     Functional Status Survey: Is the patient deaf or have difficulty hearing?: No Does the patient have difficulty seeing, even when wearing glasses/contacts?: Yes (glasses) Does the patient have difficulty concentrating, remembering, or making decisions?: No Does the patient have difficulty walking or climbing stairs?: Yes (cane) Does the patient have difficulty dressing or bathing?: No    Assessment & Plan  1. Hypertension, benign  Continue medication, monitor bp at home - losartan (COZAAR) 100 MG tablet; Take 1 tablet (100 mg total) by mouth daily.  Dispense: 90 tablet; Refill: 1 - nebivolol (BYSTOLIC) 10 MG tablet; Take 1 tablet (10 mg total) by mouth daily.  Dispense: 90 tablet;  Refill: 1 - Comprehensive metabolic panel  2. Needs flu shot  - Flu vaccine HIGH DOSE PF (Fluzone High dose)  3. Need for vaccination for Strep pneumoniae  - Pneumococcal conjugate vaccine 13-valent IM  4. Controlled gout  - Uric acid  5. Disorder of phosphorus metabolism  She used to see Endo at [facility],   6. GAD (generalized anxiety disorder)  - ALPRAZolam (XANAX) 0.5 MG tablet; Take 120 tablets (60 mg total) by mouth daily.  Dispense: 60 tablet; Refill: 2  7. Paresthesia of foot, bilateral  Likely from Tamoxifen, symptoms are not constant and she would like to hold off on further testing at this time  8. History of iron deficiency  Taking ferrous sulfate -  CBC with Differential/Platelet - Ferritin  9. Lipid screening  - Lipid panel

## 2015-02-21 ENCOUNTER — Other Ambulatory Visit: Payer: Self-pay | Admitting: Family Medicine

## 2015-02-21 ENCOUNTER — Telehealth: Payer: Self-pay | Admitting: Family Medicine

## 2015-02-21 ENCOUNTER — Other Ambulatory Visit: Payer: Self-pay | Admitting: General Surgery

## 2015-02-21 NOTE — Telephone Encounter (Signed)
Patient requesting refill. 

## 2015-02-21 NOTE — Telephone Encounter (Signed)
Patient was given a prescription for Alprazolam and she handed it to the pharmacist at [facility] and asked them to hold on to it for her until she called to refill it. Now they are telling her that they have nothing on file. She is requesting another prescription and a return call to discuss this

## 2015-02-21 NOTE — Telephone Encounter (Signed)
Called pharmacy and they state they do not have on file? What do you want to do?

## 2015-02-21 NOTE — Telephone Encounter (Signed)
Ok called in left patient voicemail

## 2015-02-21 NOTE — Telephone Encounter (Signed)
Okay to call it in. Thank you

## 2015-03-12 ENCOUNTER — Other Ambulatory Visit: Payer: Self-pay

## 2015-03-12 DIAGNOSIS — M109 Gout, unspecified: Secondary | ICD-10-CM

## 2015-03-12 MED ORDER — COLCHICINE 0.6 MG PO TABS: 1.2000 mg | ORAL_TABLET | Freq: Every day | ORAL | Status: DC | PRN

## 2015-03-12 NOTE — Telephone Encounter (Signed)
Patient needs a refill of her gout medication

## 2015-04-22 HISTORY — PX: CATARACT EXTRACTION, BILATERAL: SHX1313

## 2015-05-19 ENCOUNTER — Other Ambulatory Visit: Payer: Self-pay | Admitting: General Surgery

## 2015-06-10 ENCOUNTER — Other Ambulatory Visit: Payer: Self-pay | Admitting: Family Medicine

## 2015-06-11 ENCOUNTER — Encounter: Payer: Self-pay | Admitting: *Deleted

## 2015-07-26 ENCOUNTER — Ambulatory Visit (INDEPENDENT_AMBULATORY_CARE_PROVIDER_SITE_OTHER): Payer: Medicare Other | Admitting: Family Medicine

## 2015-07-26 ENCOUNTER — Encounter: Payer: Self-pay | Admitting: Family Medicine

## 2015-07-26 VITALS — BP 138/82 | HR 74 | Temp 98.7°F | Resp 12 | Ht <= 58 in | Wt 128.7 lb

## 2015-07-26 DIAGNOSIS — I10 Essential (primary) hypertension: Secondary | ICD-10-CM

## 2015-07-26 DIAGNOSIS — M166 Other bilateral secondary osteoarthritis of hip: Secondary | ICD-10-CM

## 2015-07-26 DIAGNOSIS — Z862 Personal history of diseases of the blood and blood-forming organs and certain disorders involving the immune mechanism: Secondary | ICD-10-CM | POA: Diagnosis not present

## 2015-07-26 DIAGNOSIS — Z23 Encounter for immunization: Secondary | ICD-10-CM

## 2015-07-26 DIAGNOSIS — M1710 Unilateral primary osteoarthritis, unspecified knee: Secondary | ICD-10-CM | POA: Insufficient documentation

## 2015-07-26 DIAGNOSIS — M109 Gout, unspecified: Secondary | ICD-10-CM | POA: Diagnosis not present

## 2015-07-26 DIAGNOSIS — R2681 Unsteadiness on feet: Secondary | ICD-10-CM | POA: Diagnosis not present

## 2015-07-26 DIAGNOSIS — E785 Hyperlipidemia, unspecified: Secondary | ICD-10-CM

## 2015-07-26 DIAGNOSIS — C50412 Malignant neoplasm of upper-outer quadrant of left female breast: Secondary | ICD-10-CM | POA: Diagnosis not present

## 2015-07-26 DIAGNOSIS — H43391 Other vitreous opacities, right eye: Secondary | ICD-10-CM

## 2015-07-26 DIAGNOSIS — M174 Other bilateral secondary osteoarthritis of knee: Secondary | ICD-10-CM

## 2015-07-26 DIAGNOSIS — M179 Osteoarthritis of knee, unspecified: Secondary | ICD-10-CM | POA: Insufficient documentation

## 2015-07-26 DIAGNOSIS — F411 Generalized anxiety disorder: Secondary | ICD-10-CM

## 2015-07-26 DIAGNOSIS — Z79899 Other long term (current) drug therapy: Secondary | ICD-10-CM | POA: Diagnosis not present

## 2015-07-26 DIAGNOSIS — R739 Hyperglycemia, unspecified: Secondary | ICD-10-CM | POA: Diagnosis not present

## 2015-07-26 MED ORDER — ALPRAZOLAM 0.5 MG PO TABS
ORAL_TABLET | ORAL | Status: DC
Start: 2015-07-26 — End: 2015-10-03

## 2015-07-26 MED ORDER — LOSARTAN POTASSIUM 100 MG PO TABS: 100.0000 mg | ORAL_TABLET | Freq: Every day | ORAL | Status: DC

## 2015-07-26 NOTE — Progress Notes (Signed)
Name: Felicia Robinson   MRN: [id number]    DOB: [birth year]   Date:07/26/2015       Progress Note  Subjective  Chief Complaint  Chief Complaint  Patient presents with  . Hypertension    patient is here for her 1-month f/u  . Generalized Anxiety Disorder  . Disorder of Phosphorous Metabolism  . Paresthia of foot, bilateral  . History of Iron Deficiency  . Acquired Scoliosis  . Fall    recurrent  . Gait Problem    instability  . Gout    controlled    HPI  HTN: she has hypertension and at home bp is controlled with medication.She denies chest pain or SOB.   Gait instability/Recurrent Falls: she had PT and is doing better, only uses cane when not at home, however since December she had a gout flare, followed by an URI and now with a floater, advised by Ophthalmologist and advised not to bend forward, so she has not been very active and is afraid that she will start to fall again. Trying to walk to prevent weakness.  GAD: taking alprazolam, she worries a lot, able to sleep and function with alprazolam and denies side effects  Paresthesia foot/pain: she noticed recurrent numbness/tingling and pain on mid foot. She has flat feet, and walks bear foot at home. She states symptoms started after taking Tamoxifen. Pain, discomfort is not constant. She states she went to the foot market and has been wearing better shoes with an insole and is doing better. Doing much better  Gout: had to take Colcrys in December and the flare lasted 2 weeks but is feeling well now.   Eye Floater: seeing Ophthalmologist.   Breast Cancer left side: dx in 2013, still on Tamoxifen, only 10 mg because that is the dose she can tolerate and sees Dr. Fleet Contras.   Disorder of phosphorus metabolism: used to see Endocrinologist and is doing well at this time.   Patient Active Problem List   Diagnosis Date Noted  . Hypertension, benign 01/29/2015  . Controlled gout 01/29/2015  . Disorder of phosphorus metabolism  01/29/2015  . GAD (generalized anxiety disorder) 01/29/2015  . Gait instability 01/29/2015  . Recurrent falls 01/29/2015  . Acquired scoliosis 01/29/2015  . Breast cancer ([place]) 09/08/2012  . H/O rickets 09/08/2012  . History of shingles 12/06/2011    Past Surgical History  Procedure Laterality Date  . Mastectomy Left 2013    with SN biopsy  . Colonoscopy  2012  . Upper gi endoscopy  2012  . Tonsillectomy  1953  . Dilation and curettage of uterus  1982  . Breast surgery Left 09/30/2011    Left simple mastectomy with sentinel node biopsy  . Cataract extraction, bilateral Bilateral 04/2015    Dr. Tommy Rainwater in [place]    Family History  Problem Relation Age of Onset  . Lymphoma Brother 58    Hodgkin's Lymphoma  . Cancer Maternal Uncle 46    cancer of the esophagus    Social History   Social History  . Marital Status: Married    Spouse Name: N/A  . Number of Children: N/A  . Years of Education: N/A   Occupational History  . Not on file.   Social History Main Topics  . Smoking status: Former Smoker -- 0.50 packs/day for 30 years    Types: Cigarettes    Quit date: 04/21/2006  . Smokeless tobacco: Never Used  . Alcohol Use: Yes  . Drug Use: No  .  Sexual Activity: Not on file   Other Topics Concern  . Not on file   Social History Narrative     Current outpatient prescriptions:  .  ALPRAZolam (XANAX) 0.5 MG tablet, TAKE 1 TABLET BY MOUTH UP TO TWICE DAILY AS NEEDED FOR ANXIETY, Disp: 60 tablet, Rfl: 0 .  aspirin 81 MG chewable tablet, Chew 81 mg by mouth daily., Disp: , Rfl:  .  calcium carbonate (OS-CAL) 600 MG TABS, Take 600 mg by mouth daily., Disp: , Rfl:  .  ferrous fumarate (HEMOCYTE - 106 MG FE) 325 (106 FE) MG TABS tablet, Take 1 tablet by mouth 2 (two) times daily., Disp: , Rfl:  .  losartan (COZAAR) 100 MG tablet, Take 1 tablet (100 mg total) by mouth daily., Disp: 90 tablet, Rfl: 1 .  Multiple Vitamins-Minerals (CENTRUM SILVER ADULT 50+ PO), Take by  mouth., Disp: , Rfl:  .  nebivolol (BYSTOLIC) 10 MG tablet, Take 1 tablet (10 mg total) by mouth daily., Disp: 90 tablet, Rfl: 1 .  Omega-3 Fatty Acids (FISH OIL) 1000 MG CAPS, Take by mouth daily., Disp: , Rfl:  .  tamoxifen (NOLVADEX) 10 MG tablet, TAKE 1 TABLET BY MOUTH EVERY DAY, Disp: 90 tablet, Rfl: 0 .  colchicine 0.6 MG tablet, Take 2 tablets (1.2 mg total) by mouth daily as needed. Max 1.8mg Bailey Mech (Patient not taking: Reported on 07/26/2015), Disp: 30 tablet, Rfl: 0  Allergies  Allergen Reactions  . Codeine Nausea And Vomiting  . Other     Pain medications Altered mental status  . Shellfish Allergy Diarrhea and Nausea And Vomiting     ROS  Constitutional: Negative for fever or weight change.  Respiratory: Negative for cough and shortness of breath.   Cardiovascular: Negative for chest pain or palpitations.  Gastrointestinal: Negative for abdominal pain, no bowel changes.  Musculoskeletal: Negative for gait problem or joint swelling.  Skin: Negative for rash.  Neurological: Negative for dizziness or headache.  No other specific complaints in a complete review of systems (except as listed in HPI above).  Objective  Filed Vitals:   07/26/15 1335  BP: 138/82  Pulse: 74  Temp: 98.7 F (37.1 C)  TempSrc: Oral  Resp: 12  Height: 4\' 9"  (1.448 m)  Weight: 128 lb 11.2 oz (58.378 kg)  SpO2: 96%    Body mass index is 27.84 kg/(m^2).  Physical Exam  Constitutional: Patient appears well-developed and well-nourished. Obese No distress.  HEENT: head atraumatic, normocephalic, pupils equal and reactive to light, neck supple, throat within normal limits Cardiovascular: Normal rate, regular rhythm and normal heart sounds.  No murmur heard. No BLE edema. Pulmonary/Chest: Effort normal and breath sounds normal. No respiratory distress. Abdominal: Soft.  There is no tenderness. Psychiatric: Patient has a normal mood and affect. behavior is normal. Judgment and thought content  normal. Muscular Skeletal: stiff when walking, crepitus with extension of both knees, difficulty with abduction of both hips also has scoliosis  PHQ2/9: Depression screen [facility] 2/9 07/26/2015 01/29/2015  Decreased Interest 0 0  Down, Depressed, Hopeless 0 0  PHQ - 2 Score 0 0    Fall Risk: Fall Risk  07/26/2015 01/29/2015  Falls in the past year? No Yes  Number falls in past yr: - 2 or more  Injury with Fall? - No     Functional Status Survey: Is the patient deaf or have difficulty hearing?: No Does the patient have difficulty seeing, even when wearing glasses/contacts?: No Does the patient have difficulty concentrating, remembering, or  making decisions?: No Does the patient have difficulty walking or climbing stairs?: Yes Does the patient have difficulty dressing or bathing?: No Does the patient have difficulty doing errands alone such as visiting a doctor's office or shopping?: No    Assessment & Plan  1. Hypertension, benign  - losartan (COZAAR) 100 MG tablet; Take 1 tablet (100 mg total) by mouth daily.  Dispense: 90 tablet; Refill: 1  2. Controlled gout  At this time she is doing well, taking prn medication   3. Disorder of phosphorus metabolism  stable  4. GAD (generalized anxiety disorder)  - ALPRAZolam (XANAX) 0.5 MG tablet; TAKE 1 TABLET BY MOUTH UP TO TWICE DAILY AS NEEDED FOR ANXIETY  Dispense: 60 tablet; Refill: 0  5. Gait instability  Resume home exercise as tolerated  6. Floaters, right  Keep eye doctor recommendations  7. Need for shingles vaccine  - Varicella-zoster vaccine subcutaneous - refused, had two episodes  8. Malignant neoplasm of upper outer quadrant of female breast, left  She already has follow up scheduled with Dr. Fleet Contras and mammography of right breast

## 2015-08-21 ENCOUNTER — Other Ambulatory Visit: Payer: Self-pay | Admitting: General Surgery

## 2015-08-21 ENCOUNTER — Other Ambulatory Visit: Payer: Self-pay | Admitting: Family Medicine

## 2015-08-21 NOTE — Telephone Encounter (Signed)
Patient requesting refill. 

## 2015-08-22 ENCOUNTER — Telehealth: Payer: Self-pay | Admitting: Family Medicine

## 2015-08-22 NOTE — Telephone Encounter (Signed)
NEEDS A RX FOR BLOOD PRESSURE MEDS. SAID THAT YOU ALL SAID THAT ONE WAS SENT IN April BUT THEY NEED IT SENT AGAIN FOR THEY DO NOT HAVE IT. [place]

## 2015-09-03 ENCOUNTER — Encounter: Payer: Self-pay | Admitting: General Surgery

## 2015-09-06 ENCOUNTER — Encounter: Payer: Self-pay | Admitting: General Surgery

## 2015-09-06 ENCOUNTER — Ambulatory Visit (INDEPENDENT_AMBULATORY_CARE_PROVIDER_SITE_OTHER): Payer: Medicare Other | Admitting: General Surgery

## 2015-09-06 VITALS — BP 136/70 | HR 68 | Resp 14 | Ht <= 58 in | Wt 123.0 lb

## 2015-09-06 DIAGNOSIS — Z853 Personal history of malignant neoplasm of breast: Secondary | ICD-10-CM | POA: Diagnosis not present

## 2015-09-06 NOTE — Patient Instructions (Signed)
The patient has been asked to return to the office in one year with a right screening mammogram. 

## 2015-09-06 NOTE — Progress Notes (Signed)
Patient ID: Felicia Robinson, female   DOB: [birth year], 70 y.o.   MRN: [id number]  Chief Complaint  Patient presents with  . Follow-up    mammogram    HPI Felicia Robinson is a 70 y.o. female who presents for a breast evaluation. The most recent right breast mammogram was done on 08/31/15.  Patient does perform regular self breast checks and gets regular mammograms done.    I personally reviewed the patient's history.  HPI  Past Medical History  Diagnosis Date  . Hypertension 2009  . Gout 2013  . Shingles 2013  . Disorders of phosphorus metabolism     rickets  . Fibroadenosis of breast   . Osteomalacia   . Cancer ([place]) 2013    L breast  . Malignant neoplasm of upper-outer quadrant of female breast ([place]) 09/30/2011    7 mm low-grade invasive mammary carcinoma with focal DCIS. ER 90%, PR 1%, HER-2/neu not overexpressing.    Past Surgical History  Procedure Laterality Date  . Mastectomy Left 2013    with SN biopsy  . Colonoscopy  2012  . Upper gi endoscopy  2012  . Tonsillectomy  1953  . Dilation and curettage of uterus  1982  . Breast surgery Left 09/30/2011    Left simple mastectomy with sentinel node biopsy  . Cataract extraction, bilateral Bilateral 04/2015    Dr. Tommy Rainwater in [place]    Family History  Problem Relation Age of Onset  . Lymphoma Brother 67    Hodgkin's Lymphoma  . Cancer Maternal Uncle 85    cancer of the esophagus    Social History Social History  Substance Use Topics  . Smoking status: Former Smoker -- 0.50 packs/day for 30 years    Types: Cigarettes    Quit date: 04/21/2006  . Smokeless tobacco: Never Used  . Alcohol Use: Yes    Allergies  Allergen Reactions  . Codeine Nausea And Vomiting  . Other     Pain medications Altered mental status  . Shellfish Allergy Diarrhea and Nausea And Vomiting    Current Outpatient Prescriptions  Medication Sig Dispense Refill  . ALPRAZolam (XANAX) 0.5 MG tablet TAKE 1 TABLET BY MOUTH UP TO TWICE  DAILY AS NEEDED FOR ANXIETY 60 tablet 0  . aspirin 81 MG chewable tablet Chew 81 mg by mouth daily.    . calcium carbonate (OS-CAL) 600 MG TABS Take 600 mg by mouth daily.    . colchicine 0.6 MG tablet Take 2 tablets (1.2 mg total) by mouth daily as needed. Max 1.'8mg'$ /24hrs 30 tablet 0  . losartan (COZAAR) 100 MG tablet Take 1 tablet (100 mg total) by mouth daily. 90 tablet 1  . Multiple Vitamins-Minerals (CENTRUM SILVER ADULT 50+ PO) Take by mouth.    . nebivolol (BYSTOLIC) 10 MG tablet Take 1 tablet (10 mg total) by mouth daily. 90 tablet 1  . Omega-3 Fatty Acids (FISH OIL) 1000 MG CAPS Take by mouth daily.    . tamoxifen (NOLVADEX) 10 MG tablet TAKE 1 TABLET BY MOUTH EVERY DAY 90 tablet 0  . ferrous fumarate (HEMOCYTE - 106 MG FE) 325 (106 FE) MG TABS tablet Take 1 tablet by mouth 2 (two) times daily. Reported on 09/06/2015     No current facility-administered medications for this visit.    Review of Systems Review of Systems  Constitutional: Negative.   Respiratory: Negative.   Cardiovascular: Negative.     Blood pressure 136/70, pulse 68, resp. rate 14, height '4\' 7"'$  (1.397  m), weight 123 lb (55.792 kg).  Physical Exam Physical Exam  Constitutional: She is oriented to person, place, and time. She appears well-developed and well-nourished.  Eyes: Conjunctivae are normal. No scleral icterus.  Neck: Neck supple.  Cardiovascular: Normal rate, regular rhythm and normal heart sounds.   Pulmonary/Chest: Effort normal and breath sounds normal. Right breast exhibits no inverted nipple, no mass, no nipple discharge, no skin change and no tenderness.    Abdominal: Soft. Bowel sounds are normal. There is no tenderness.  Lymphadenopathy:    She has no cervical adenopathy.    She has no axillary adenopathy.  Left mastectomy site is clean and well healed.   Neurological: She is alert and oriented to person, place, and time.  Skin: Skin is warm and dry.    Data Reviewed 08/31/2015 right  breast diagnostic mammogram completed at [facility] was reviewed. No interval change. BI-RADS-1.  Assessment    Doing well now 4 years status post left mastectomy for T1b, N0 carcinoma.    Plan    The patient did not tolerate Femara, and initially had similar symptoms on tamoxifen 20 mg daily. She is presently.tolerating tamoxifen 10 mg daily.     The patient has been asked to return to the office in one year with a right screening mammogram. PCP:  [facility] This information has been scribed by Gaspar Cola CMA.    Robert Bellow 09/08/2015, 11:01 AM

## 2015-10-03 ENCOUNTER — Other Ambulatory Visit: Payer: Self-pay | Admitting: Family Medicine

## 2015-10-03 NOTE — Telephone Encounter (Signed)
Patient requesting refill. 

## 2015-10-04 NOTE — Telephone Encounter (Signed)
Pt is going out of town today and wants to know if she can get this today.

## 2015-11-19 ENCOUNTER — Other Ambulatory Visit: Payer: Self-pay | Admitting: General Surgery

## 2015-12-06 ENCOUNTER — Other Ambulatory Visit: Payer: Self-pay | Admitting: Family Medicine

## 2015-12-06 NOTE — Telephone Encounter (Signed)
Patient requesting refill of Alprazolam to [facility].  

## 2015-12-07 ENCOUNTER — Telehealth: Payer: Self-pay | Admitting: Family Medicine

## 2015-12-07 NOTE — Telephone Encounter (Signed)
Called in to pharmacy and gave verbal for medication to be filled. Please inform pt medication should be ready for pick up.

## 2015-12-07 NOTE — Telephone Encounter (Signed)
Patient is having difficulty getting her prescription for Alprazolam for the [facility] [address]. Pharmacy informed patient to have our office to call them for authorization.

## 2016-01-26 LAB — CBC WITH DIFFERENTIAL/PLATELET
Basophils Absolute: 0 10*3/uL (ref 0.0–0.2)
Basos: 1 %
EOS (ABSOLUTE): 0.3 10*3/uL (ref 0.0–0.4)
EOS: 4 %
HEMATOCRIT: 39.4 % (ref 34.0–46.6)
HEMOGLOBIN: 13.5 g/dL (ref 11.1–15.9)
Immature Grans (Abs): 0 10*3/uL (ref 0.0–0.1)
Immature Granulocytes: 0 %
LYMPHS ABS: 2 10*3/uL (ref 0.7–3.1)
Lymphs: 33 %
MCH: 30.6 pg (ref 26.6–33.0)
MCHC: 34.3 g/dL (ref 31.5–35.7)
MCV: 89 fL (ref 79–97)
MONOCYTES: 6 %
MONOS ABS: 0.4 10*3/uL (ref 0.1–0.9)
NEUTROS ABS: 3.4 10*3/uL (ref 1.4–7.0)
Neutrophils: 56 %
Platelets: 253 10*3/uL (ref 150–379)
RBC: 4.41 x10E6/uL (ref 3.77–5.28)
RDW: 13.2 % (ref 12.3–15.4)
WBC: 6.2 10*3/uL (ref 3.4–10.8)

## 2016-01-26 LAB — COMPREHENSIVE METABOLIC PANEL
A/G RATIO: 1.8 (ref 1.2–2.2)
ALBUMIN: 4.4 g/dL (ref 3.6–4.8)
ALK PHOS: 179 IU/L — AB (ref 39–117)
ALT: 22 IU/L (ref 0–32)
AST: 25 IU/L (ref 0–40)
BUN / CREAT RATIO: 19 (ref 12–28)
BUN: 17 mg/dL (ref 8–27)
Bilirubin Total: 0.6 mg/dL (ref 0.0–1.2)
CALCIUM: 9.4 mg/dL (ref 8.7–10.3)
CO2: 23 mmol/L (ref 18–29)
Chloride: 98 mmol/L (ref 96–106)
Creatinine, Ser: 0.89 mg/dL (ref 0.57–1.00)
GFR calc Af Amer: 76 mL/min/{1.73_m2} (ref >59)
GFR, EST NON AFRICAN AMERICAN: 66 mL/min/{1.73_m2} (ref >59)
GLOBULIN, TOTAL: 2.5 g/dL (ref 1.5–4.5)
Glucose: 100 mg/dL — ABNORMAL HIGH (ref 65–99)
POTASSIUM: 4.6 mmol/L (ref 3.5–5.2)
SODIUM: 139 mmol/L (ref 134–144)
Total Protein: 6.9 g/dL (ref 6.0–8.5)

## 2016-01-26 LAB — FERRITIN: Ferritin: 105 ng/mL (ref 15–150)

## 2016-01-26 LAB — LIPID PANEL
CHOL/HDL RATIO: 3.7 ratio (ref 0.0–4.4)
Cholesterol, Total: 186 mg/dL (ref 100–199)
HDL: 50 mg/dL (ref >39)
LDL Calculated: 89 mg/dL (ref 0–99)
Triglycerides: 235 mg/dL — ABNORMAL HIGH (ref 0–149)
VLDL CHOLESTEROL CAL: 47 mg/dL — AB (ref 5–40)

## 2016-01-26 LAB — VITAMIN D 25 HYDROXY (VIT D DEFICIENCY, FRACTURES): Vit D, 25-Hydroxy: 26.9 ng/mL — ABNORMAL LOW (ref 30.0–100.0)

## 2016-01-26 LAB — HEMOGLOBIN A1C
ESTIMATED AVERAGE GLUCOSE: 97 mg/dL
Hgb A1c MFr Bld: 5 % (ref 4.8–5.6)

## 2016-01-26 LAB — URIC ACID: URIC ACID: 8.7 mg/dL — AB (ref 2.5–7.1)

## 2016-01-29 ENCOUNTER — Telehealth: Payer: Self-pay

## 2016-01-29 NOTE — Telephone Encounter (Signed)
Left message for patient to return my call regarding labs.  

## 2016-01-29 NOTE — Telephone Encounter (Signed)
-----   Message from Steele Sizer, MD sent at 01/27/2016  5:23 PM EDT ----- LDL has improved, continue current regiment Uric acid is elevated and will discuss medication on her next visit in a couple of days Glucose is borderline elevated but she has normal hgbA1C Normal kidney  Function and alkaline phosphatase is improving Normal CBC and iron storage Vitamin D is slightly low, continue otc vitamin D

## 2016-01-30 ENCOUNTER — Encounter: Payer: Self-pay | Admitting: Family Medicine

## 2016-01-30 ENCOUNTER — Ambulatory Visit (INDEPENDENT_AMBULATORY_CARE_PROVIDER_SITE_OTHER): Payer: Medicare Other | Admitting: Family Medicine

## 2016-01-30 VITALS — BP 128/76 | HR 90 | Temp 98.2°F | Resp 16 | Ht <= 58 in | Wt 127.0 lb

## 2016-01-30 DIAGNOSIS — I10 Essential (primary) hypertension: Secondary | ICD-10-CM | POA: Diagnosis not present

## 2016-01-30 DIAGNOSIS — M109 Gout, unspecified: Secondary | ICD-10-CM | POA: Diagnosis not present

## 2016-01-30 DIAGNOSIS — Z0001 Encounter for general adult medical examination with abnormal findings: Secondary | ICD-10-CM

## 2016-01-30 DIAGNOSIS — E785 Hyperlipidemia, unspecified: Secondary | ICD-10-CM

## 2016-01-30 DIAGNOSIS — R2681 Unsteadiness on feet: Secondary | ICD-10-CM | POA: Diagnosis not present

## 2016-01-30 DIAGNOSIS — Z23 Encounter for immunization: Secondary | ICD-10-CM

## 2016-01-30 DIAGNOSIS — Z0000 Encounter for general adult medical examination without abnormal findings: Secondary | ICD-10-CM

## 2016-01-30 DIAGNOSIS — F411 Generalized anxiety disorder: Secondary | ICD-10-CM

## 2016-01-30 MED ORDER — OMEGA-3-ACID ETHYL ESTERS 1 G PO CAPS: 2.0000 g | ORAL_CAPSULE | Freq: Two times a day (BID) | ORAL | 5 refills | Status: DC

## 2016-01-30 MED ORDER — LOSARTAN POTASSIUM 100 MG PO TABS: 100.0000 mg | ORAL_TABLET | Freq: Every day | ORAL | 1 refills | Status: DC

## 2016-01-30 MED ORDER — NEBIVOLOL HCL 10 MG PO TABS: 10.0000 mg | ORAL_TABLET | Freq: Every day | ORAL | 1 refills | Status: DC

## 2016-01-30 MED ORDER — ALLOPURINOL 100 MG PO TABS: 100.0000 mg | ORAL_TABLET | Freq: Two times a day (BID) | ORAL | 1 refills | Status: DC

## 2016-01-30 MED ORDER — ALPRAZOLAM 0.5 MG PO TABS: 0.2500 mg | ORAL_TABLET | Freq: Every evening | ORAL | 3 refills | Status: DC | PRN

## 2016-01-30 NOTE — Progress Notes (Signed)
Name: Felicia Robinson   MRN: [id number]    DOB: [birth year]   Date:01/30/2016       Progress Note  Subjective  Chief Complaint  Chief Complaint  Patient presents with  . Medication Refill    6 month F/U  . Annual Exam  . Hypertension  . Gout    Had a recent flair up and states it is worst in the left foot    HPI   Functional ability/safety issues: she uses a cane Hearing issues: Addressed  Activities of daily living: Discussed Home safety issues: No Issues  End Of Life Planning: Offered verbal information regarding advanced directives, healthcare power of attorney.  Preventative care, Health maintenance, Preventative health measures discussed.  Preventative screenings discussed today: lab work, colonoscopy,  mammogram, DEXA.  Low Dose CT Chest recommended if Age 55-80 years, 30 pack-year currently smoking OR have quit w/in 15years.   Lifestyle risk factor issued reviewed: Diet, exercise, weight management, advised patient smoking is not healthy, nutrition/diet.  Preventative health measures discussed (5-10 year plan).  Reviewed and recommended vaccinations: - Pneumovax  - Prevnar  - Annual Influenza - Zostavax - Tdap   Depression screening: Done Fall risk screening: Done Discuss ADLs/IADLs: Done  Current medical providers: See HPI  Other health risk factors identified this visit: No other issues Cognitive impairment issues: None identified  All above discussed with patient. Appropriate education, counseling and referral will be made based upon the above.    HTN: she has hypertension and bp is at goal .She denies chest pain or SOB.   Gait instability/Recurrent Falls: she had PT and is doing better, only uses cane when not at home.   GAD: taking alprazolam, she worries a lot, able to sleep and function with alprazolam and denies side effects. Discussed mindful exercises  Paresthesia foot/pain: she noticed recurrent numbness/tingling and pain on mid foot. She  has flat feet, and walks bear foot at home. She states symptoms started after taking Tamoxifen. Pain, discomfort is not constant. She states she went to the foot market and has been wearing better shoes with an insole and is doing better. Doing much better  Gout: had to take Colcrys in December and had a couple of flares over the past month  Breast Cancer left side: dx in 2013, still on Tamoxifen, only 10 mg because that is the dose she can tolerate and sees Dr. Fleet Contras.   Disorder of phosphorus metabolism: used to see Endocrinologist and is doing well at this time.   Foot pain: she states she will try new shoes before she goes to podiatrist. She has burning pain on ball of both feet intermittent over the past couple of years. Discussed gabapentin but she will try new shoes first.    Patient Active Problem List   Diagnosis Date Noted  . History of breast cancer 09/08/2015  . Osteoarthritis, knee 07/26/2015  . Other bilateral secondary osteoarthritis of hip 07/26/2015  . Hypertension, benign 01/29/2015  . Controlled gout 01/29/2015  . Disorder of phosphorus metabolism 01/29/2015  . GAD (generalized anxiety disorder) 01/29/2015  . Gait instability 01/29/2015  . Recurrent falls 01/29/2015  . Acquired scoliosis 01/29/2015  . Breast cancer ([place]) 09/08/2012  . H/O rickets 09/08/2012  . History of shingles 12/06/2011    Past Surgical History:  Procedure Laterality Date  . BREAST SURGERY Left 09/30/2011   Left simple mastectomy with sentinel node biopsy  . CATARACT EXTRACTION, BILATERAL Bilateral 04/2015   Dr. Tommy Rainwater in [place]  .  COLONOSCOPY  2012  . DILATION AND CURETTAGE OF UTERUS  1982  . MASTECTOMY Left 2013   with SN biopsy  . TONSILLECTOMY  1953  . UPPER GI ENDOSCOPY  2012    Family History  Problem Relation Age of Onset  . Lymphoma Brother 33    Hodgkin's Lymphoma  . Cancer Maternal Uncle 35    cancer of the esophagus    Social History   Social History  .  Marital status: Married    Spouse name: N/A  . Number of children: N/A  . Years of education: N/A   Occupational History  . Not on file.   Social History Main Topics  . Smoking status: Former Smoker    Packs/day: 0.50    Years: 30.00    Types: Cigarettes    Quit date: 04/21/2006  . Smokeless tobacco: Never Used  . Alcohol use Yes  . Drug use: No  . Sexual activity: Not on file   Other Topics Concern  . Not on file   Social History Narrative  . No narrative on file     Current Outpatient Prescriptions:  .  ALPRAZolam (XANAX) 0.5 MG tablet, TAKE 1 TABLET BY MOUTH TWICE DAILY AS NEEDED FOR ANXIETY, Disp: 60 tablet, Rfl: 0 .  aspirin 81 MG chewable tablet, Chew 81 mg by mouth daily., Disp: , Rfl:  .  calcium carbonate (OS-CAL) 600 MG TABS, Take 600 mg by mouth daily., Disp: , Rfl:  .  colchicine 0.6 MG tablet, Take 2 tablets (1.2 mg total) by mouth daily as needed. Max 1.8mg /24hrs, Disp: 30 tablet, Rfl: 0 .  Ferrous Sulfate (IRON) 325 (65 Fe) MG TABS, Take 1 tablet by mouth daily., Disp: , Rfl:  .  losartan (COZAAR) 100 MG tablet, Take 1 tablet (100 mg total) by mouth daily., Disp: 90 tablet, Rfl: 1 .  Multiple Vitamins-Minerals (CENTRUM SILVER ADULT 50+ PO), Take 1 tablet by mouth daily. , Disp: , Rfl:  .  nebivolol (BYSTOLIC) 10 MG tablet, Take 1 tablet (10 mg total) by mouth daily. (Patient taking differently: Take 5 mg by mouth daily. Cuts the pills in half to make 5 mg daily), Disp: 90 tablet, Rfl: 1 .  Omega-3 Fatty Acids (FISH OIL) 1200 MG CAPS, Take 1 capsule by mouth daily., Disp: , Rfl:  .  tamoxifen (NOLVADEX) 10 MG tablet, TAKE 1 TABLET BY MOUTH EVERY DAY, Disp: 90 tablet, Rfl: 0  Allergies  Allergen Reactions  . Codeine Nausea And Vomiting  . Other     Pain medications Altered mental status  . Shellfish Allergy Diarrhea and Nausea And Vomiting    Just when eating clams     ROS  Constitutional: Negative for fever or weight change.  Respiratory: Negative for  cough and shortness of breath.   Cardiovascular: Negative for chest pain or palpitations.  Gastrointestinal: Negative for abdominal pain, no bowel changes.  Musculoskeletal: Positive for gait problem , positive for intermittent  joint swelling.  Skin: Negative for rash.  Neurological: Negative for dizziness or headache.  No other specific complaints in a complete review of systems (except as listed in HPI above).  Objective  Vitals:   01/30/16 1422  BP: 128/76  Pulse: 90  Resp: 16  Temp: 98.2 F (36.8 C)  TempSrc: Oral  SpO2: 96%  Weight: 127 lb (57.6 kg)  Height: 4' 6.74" (1.39 m)    Body mass index is 29.8 kg/m.  Physical Exam  Constitutional: Patient appears well-developed and well-nourished. No  distress.  HENT: Head: Normocephalic and atraumatic. Ears: B TMs ok, no erythema or effusion; Nose: Nose normal. Mouth/Throat: Oropharynx is clear and moist. No oropharyngeal exudate.  Eyes: Conjunctivae and EOM are normal. Pupils are equal, round, and reactive to light. No scleral icterus.  Neck: Normal range of motion. Neck supple. No JVD present. No thyromegaly present.  Cardiovascular: Normal rate, regular rhythm and normal heart sounds.  No murmur heard. No BLE edema. Pulmonary/Chest: Effort normal and breath sounds normal. No respiratory distress. Abdominal: Soft. Bowel sounds are normal, no distension. There is no tenderness. no masses Breast: no lumps or masses, no nipple discharge or rashes FEMALE GENITALIA:  External genitalia shows atrophy External urethra normal RECTAL: not done Musculoskeletal: Decrease rom of both hips, some DID of fingers, also decrease in extension of both elbows, decrease flexion of both knees, stay at 90 degree Neurological: he is alert and oriented to person, place, and time. No cranial nerve deficit. Coordination, balance, strength, speech and gait are normal.  Skin: Skin is warm and dry. No rash noted. No erythema.  Psychiatric: Patient has a  normal mood and affect. behavior is normal. Judgment and thought content normal.  Recent Results (from the past 2160 hour(s))  Lipid panel     Status: Abnormal   Collection Time: 01/25/16  9:47 AM  Result Value Ref Range   Cholesterol, Total 186 100 - 199 mg/dL   Triglycerides 235 (H) 0 - 149 mg/dL   HDL 50 >39 mg/dL   VLDL Cholesterol Cal 47 (H) 5 - 40 mg/dL   LDL Calculated 89 0 - 99 mg/dL   Chol/HDL Ratio 3.7 0.0 - 4.4 ratio units    Comment:                                   T. Chol/HDL Ratio                                             Men  Women                               1/2 Avg.Risk  3.4    3.3                                   Avg.Risk  5.0    4.4                                2X Avg.Risk  9.6    7.1                                3X Avg.Risk 23.4   11.0   Uric acid     Status: Abnormal   Collection Time: 01/25/16  9:47 AM  Result Value Ref Range   Uric Acid 8.7 (H) 2.5 - 7.1 mg/dL    Comment:            Therapeutic target for gout patients: <6.0  Comprehensive metabolic panel     Status: Abnormal   Collection Time: 01/25/16  9:47  AM  Result Value Ref Range   Glucose 100 (H) 65 - 99 mg/dL   BUN 17 8 - 27 mg/dL   Creatinine, Ser 0.89 0.57 - 1.00 mg/dL   GFR calc non Af Amer 66 >59 mL/min/1.73   GFR calc Af Amer 76 >59 mL/min/1.73   BUN/Creatinine Ratio 19 12 - 28   Sodium 139 134 - 144 mmol/L   Potassium 4.6 3.5 - 5.2 mmol/L   Chloride 98 96 - 106 mmol/L   CO2 23 18 - 29 mmol/L   Calcium 9.4 8.7 - 10.3 mg/dL   Total Protein 6.9 6.0 - 8.5 g/dL   Albumin 4.4 3.6 - 4.8 g/dL   Globulin, Total 2.5 1.5 - 4.5 g/dL   Albumin/Globulin Ratio 1.8 1.2 - 2.2   Bilirubin Total 0.6 0.0 - 1.2 mg/dL   Alkaline Phosphatase 179 (H) 39 - 117 IU/L   AST 25 0 - 40 IU/L   ALT 22 0 - 32 IU/L  Ferritin     Status: None   Collection Time: 01/25/16  9:47 AM  Result Value Ref Range   Ferritin 105 15 - 150 ng/mL  CBC with Differential/Platelet     Status: None   Collection Time:  01/25/16  9:47 AM  Result Value Ref Range   WBC 6.2 3.4 - 10.8 x10E3/uL   RBC 4.41 3.77 - 5.28 x10E6/uL   Hemoglobin 13.5 11.1 - 15.9 g/dL   Hematocrit 39.4 34.0 - 46.6 %   MCV 89 79 - 97 fL   MCH 30.6 26.6 - 33.0 pg   MCHC 34.3 31.5 - 35.7 g/dL   RDW 13.2 12.3 - 15.4 %   Platelets 253 150 - 379 x10E3/uL   Neutrophils 56 Not Estab. %   Lymphs 33 Not Estab. %   Monocytes 6 Not Estab. %   Eos 4 Not Estab. %   Basos 1 Not Estab. %   Neutrophils Absolute 3.4 1.4 - 7.0 x10E3/uL   Lymphocytes Absolute 2.0 0.7 - 3.1 x10E3/uL   Monocytes Absolute 0.4 0.1 - 0.9 x10E3/uL   EOS (ABSOLUTE) 0.3 0.0 - 0.4 x10E3/uL   Basophils Absolute 0.0 0.0 - 0.2 x10E3/uL   Immature Granulocytes 0 Not Estab. %   Immature Grans (Abs) 0.0 0.0 - 0.1 x10E3/uL  Hemoglobin A1c     Status: None   Collection Time: 01/25/16  9:47 AM  Result Value Ref Range   Hgb A1c MFr Bld 5.0 4.8 - 5.6 %    Comment:          Pre-diabetes: 5.7 - 6.4          Diabetes: >6.4          Glycemic control for adults with diabetes: <7.0    Est. average glucose Bld gHb Est-mCnc 97 mg/dL  VITAMIN D 25 Hydroxy (Vit-D Deficiency, Fractures)     Status: Abnormal   Collection Time: 01/25/16  9:47 AM  Result Value Ref Range   Vit D, 25-Hydroxy 26.9 (L) 30.0 - 100.0 ng/mL    Comment: Vitamin D deficiency has been defined by the [place] and an Endocrine Society practice guideline as a level of serum 25-OH vitamin D less than 20 ng/mL (1,2). The Endocrine Society went on to further define vitamin D insufficiency as a level between 21 and 29 ng/mL (2). 1. IOM (Institute of Medicine). 2010. Dietary reference    intakes for calcium and D. [place]: The    [employer]. 2. Holick MF, Binkley  [place], Bischoff-Ferrari HA, et al.    Evaluation, treatment, and prevention of vitamin D    deficiency: an Endocrine Society clinical practice    guideline. JCEM. 2011 Jul; 96(7):1911-30.      PHQ2/9: Depression screen [facility]  2/9 01/30/2016 07/26/2015 01/29/2015  Decreased Interest 0 0 0  Down, Depressed, Hopeless 0 0 0  PHQ - 2 Score 0 0 0     Fall Risk: Fall Risk  01/30/2016 07/26/2015 01/29/2015  Falls in the past year? No No Yes  Number falls in past yr: - - 2 or more  Injury with Fall? - - No     Functional Status Survey: Is the patient deaf or have difficulty hearing?: No Does the patient have difficulty seeing, even when wearing glasses/contacts?: No Does the patient have difficulty concentrating, remembering, or making decisions?: No Does the patient have difficulty walking or climbing stairs?: Yes (walks with a cane) Does the patient have difficulty dressing or bathing?: No    Assessment & Plan  1. Medicare annual wellness visit, subsequent  Discussed importance of 150 minutes of physical activity weekly, eat two servings of fish weekly, eat one serving of tree nuts ( cashews, pistachios, pecans, almonds.Marland Kitchen) every other day, eat 6 servings of fruit/vegetables daily and drink plenty of water and avoid sweet beverages.   2. Needs flu shot  - Flu vaccine HIGH DOSE PF (Fluzone High dose)  3. Hypertension, benign  - losartan (COZAAR) 100 MG tablet; Take 1 tablet (100 mg total) by mouth daily.  Dispense: 90 tablet; Refill: 1 - nebivolol (BYSTOLIC) 10 MG tablet; Take 1 tablet (10 mg total) by mouth daily. To prevent gout  Dispense: 90 tablet; Refill: 1  4. Controlled gout  - allopurinol (ZYLOPRIM) 100 MG tablet; Take 1 tablet (100 mg total) by mouth 2 (two) times daily.  Dispense: 180 tablet; Refill: 1  5. Disorder of phosphorus metabolism   6. GAD (generalized anxiety disorder)  - ALPRAZolam (XANAX) 0.5 MG tablet; Take 0.5-1 tablets (0.25-0.5 mg total) by mouth at bedtime as needed. for anxiety  Dispense: 30 tablet; Refill: 3  7. Gait instability  Continue cane and fall prevention, needs to exercise again  8. Dyslipidemia  - omega-3 acid ethyl esters (LOVAZA) 1 g capsule; Take 2  capsules (2 g total) by mouth 2 (two) times daily.  Dispense: 120 capsule; Refill: 5

## 2016-02-15 ENCOUNTER — Other Ambulatory Visit: Payer: Self-pay | Admitting: General Surgery

## 2016-03-19 ENCOUNTER — Other Ambulatory Visit: Payer: Self-pay | Admitting: Family Medicine

## 2016-03-19 DIAGNOSIS — I10 Essential (primary) hypertension: Secondary | ICD-10-CM

## 2016-03-19 NOTE — Telephone Encounter (Signed)
Patient was seen in October and had bystolic sent to pharmacy. She did not need the medication at the time. Patient called the pharmacy today and they told her that they no longer had that prescription on file. Is it possible for you to resend the request to [facility] [address]. Please call pt once complete.

## 2016-06-03 ENCOUNTER — Ambulatory Visit (INDEPENDENT_AMBULATORY_CARE_PROVIDER_SITE_OTHER): Payer: Medicare Other | Admitting: Family Medicine

## 2016-06-03 ENCOUNTER — Encounter: Payer: Self-pay | Admitting: Family Medicine

## 2016-06-03 VITALS — BP 122/76 | HR 86 | Temp 98.1°F | Resp 16 | Ht <= 58 in | Wt 128.1 lb

## 2016-06-03 DIAGNOSIS — R2681 Unsteadiness on feet: Secondary | ICD-10-CM | POA: Diagnosis not present

## 2016-06-03 DIAGNOSIS — R202 Paresthesia of skin: Secondary | ICD-10-CM

## 2016-06-03 DIAGNOSIS — I10 Essential (primary) hypertension: Secondary | ICD-10-CM

## 2016-06-03 DIAGNOSIS — M166 Other bilateral secondary osteoarthritis of hip: Secondary | ICD-10-CM

## 2016-06-03 DIAGNOSIS — Z23 Encounter for immunization: Secondary | ICD-10-CM | POA: Diagnosis not present

## 2016-06-03 DIAGNOSIS — F411 Generalized anxiety disorder: Secondary | ICD-10-CM | POA: Diagnosis not present

## 2016-06-03 DIAGNOSIS — M109 Gout, unspecified: Secondary | ICD-10-CM

## 2016-06-03 DIAGNOSIS — E785 Hyperlipidemia, unspecified: Secondary | ICD-10-CM

## 2016-06-03 DIAGNOSIS — R739 Hyperglycemia, unspecified: Secondary | ICD-10-CM

## 2016-06-03 MED ORDER — ALLOPURINOL 100 MG PO TABS: 100.0000 mg | ORAL_TABLET | Freq: Two times a day (BID) | ORAL | 1 refills | Status: DC

## 2016-06-03 MED ORDER — LOSARTAN POTASSIUM 100 MG PO TABS: 100.0000 mg | ORAL_TABLET | Freq: Every day | ORAL | 1 refills | Status: DC

## 2016-06-03 MED ORDER — ALPRAZOLAM 0.5 MG PO TABS: 0.2500 mg | ORAL_TABLET | Freq: Every evening | ORAL | 3 refills | Status: DC | PRN

## 2016-06-03 NOTE — Progress Notes (Signed)
Name: Felicia Robinson   MRN: [id number]    DOB: [birth year]   Date:06/03/2016       Progress Note  Subjective  Chief Complaint  Chief Complaint  Patient presents with  . Hypertension    4 month follow up  . Gout  . Anxiety    HPI  HTN: she has hypertension and bp is at goal .She denies chest pain or SOB.   Gait instability/Recurrent Falls: she had PT and is doing better, only uses cane when not at home.   GAD: taking alprazolam, she worries a lot, able to sleep and function with alprazolam and denies side effects. Discussed mindful exercises. She is doing better, not taking first dose in the morning every day, using more prn.   Gout:  She has not been taking colcrys only taking Allopurinol once daily, but uric acid was high, therefore she will increase Allopurinol to twice a day   Breast Cancer left side: dx in 2013, still on Tamoxifen, only 10 mg because that is the dose she can tolerate and sees Dr. Fleet Contras. She will complete 5 years Spring 2018  Disorder of phosphorus metabolism: used to see Endocrinologist and is doing well at this time.   Foot pain: she states she will try new shoes before she goes to podiatrist. She has burning pain on ball of both feet intermittent over the past couple of years. We will check B12 and folate and discussed gabapentin again. She has changed her shoes She states symptoms started after taking Tamoxifen.   Patient Active Problem List   Diagnosis Date Noted  . History of breast cancer 09/08/2015  . Osteoarthritis, knee 07/26/2015  . Other bilateral secondary osteoarthritis of hip 07/26/2015  . Hypertension, benign 01/29/2015  . Controlled gout 01/29/2015  . Disorder of phosphorus metabolism 01/29/2015  . GAD (generalized anxiety disorder) 01/29/2015  . Gait instability 01/29/2015  . Recurrent falls 01/29/2015  . Acquired scoliosis 01/29/2015  . Breast cancer ([place]) 09/08/2012  . H/O rickets 09/08/2012  . History of shingles 12/06/2011     Past Surgical History:  Procedure Laterality Date  . BREAST SURGERY Left 09/30/2011   Left simple mastectomy with sentinel node biopsy  . CATARACT EXTRACTION, BILATERAL Bilateral 04/2015   Dr. Tommy Rainwater in [place]  . COLONOSCOPY  2012  . DILATION AND CURETTAGE OF UTERUS  1982  . MASTECTOMY Left 2013   with SN biopsy  . TONSILLECTOMY  1953  . UPPER GI ENDOSCOPY  2012    Family History  Problem Relation Age of Onset  . Lymphoma Brother 23    Hodgkin's Lymphoma  . Cancer Maternal Uncle 50    cancer of the esophagus    Social History   Social History  . Marital status: Married    Spouse name: N/A  . Number of children: N/A  . Years of education: N/A   Occupational History  . Not on file.   Social History Main Topics  . Smoking status: Former Smoker    Packs/day: 0.50    Years: 30.00    Types: Cigarettes    Quit date: 04/21/2006  . Smokeless tobacco: Never Used  . Alcohol use Yes  . Drug use: No  . Sexual activity: Not on file   Other Topics Concern  . Not on file   Social History Narrative  . No narrative on file     Current Outpatient Prescriptions:  .  allopurinol (ZYLOPRIM) 100 MG tablet, Take 1 tablet (100 mg total) by  mouth 2 (two) times daily., Disp: 180 tablet, Rfl: 1 .  ALPRAZolam (XANAX) 0.5 MG tablet, Take 0.5-1 tablets (0.25-0.5 mg total) by mouth at bedtime as needed. for anxiety, Disp: 30 tablet, Rfl: 3 .  aspirin 81 MG chewable tablet, Chew 81 mg by mouth daily., Disp: , Rfl:  .  BYSTOLIC 10 MG tablet, TAKE 1 TABLET(10 MG) BY MOUTH DAILY, Disp: 90 tablet, Rfl: 0 .  calcium carbonate (OS-CAL) 600 MG TABS, Take 600 mg by mouth daily., Disp: , Rfl:  .  colchicine 0.6 MG tablet, Take 2 tablets (1.2 mg total) by mouth daily as needed. Max 1.8mg Bailey Mech, Disp: 30 tablet, Rfl: 0 .  losartan (COZAAR) 100 MG tablet, Take 1 tablet (100 mg total) by mouth daily., Disp: 90 tablet, Rfl: 1 .  Multiple Vitamins-Minerals (CENTRUM SILVER ADULT 50+ PO), Take 1  tablet by mouth daily. , Disp: , Rfl:  .  omega-3 acid ethyl esters (LOVAZA) 1 g capsule, Take 2 capsules (2 g total) by mouth 2 (two) times daily., Disp: 120 capsule, Rfl: 5 .  tamoxifen (NOLVADEX) 10 MG tablet, TAKE 1 TABLET BY MOUTH EVERY DAY, Disp: 90 tablet, Rfl: 3  Allergies  Allergen Reactions  . Codeine Nausea And Vomiting  . Other     Pain medications Altered mental status  . Shellfish Allergy Diarrhea and Nausea And Vomiting    Just when eating clams     ROS  Constitutional: Negative for fever or weight change.  Respiratory: Negative for cough and shortness of breath.   Cardiovascular: Negative for chest pain or palpitations.  Gastrointestinal: Negative for abdominal pain, no bowel changes.  Musculoskeletal: Positive  for gait problem no  joint swelling.  Skin: Negative for rash.  Neurological: Negative for dizziness or headache.  No other specific complaints in a complete review of systems (except as listed in HPI above).  Objective  Vitals:   06/03/16 1320  BP: 122/76  Pulse: 86  Resp: 16  Temp: 98.1 F (36.7 C)  SpO2: 95%  Weight: 128 lb 2 oz (58.1 kg)  Height: 4\' 7"  (1.397 m)    Body mass index is 29.78 kg/m.  Physical Exam  Constitutional: Patient appears well-developed and well-nourished. Obese No distress.  HEENT: head atraumatic, normocephalic, pupils equal and reactive to light, neck supple, throat within normal limits Cardiovascular: Normal rate, regular rhythm and normal heart sounds.  No murmur heard. No BLE edema. Pulmonary/Chest: Effort normal and breath sounds normal. No respiratory distress. Abdominal: Soft.  There is no tenderness. Psychiatric: Patient has a normal mood and affect. behavior is normal. Judgment and thought content normal. Muscular Skeletal: stiff when walking, crepitus with extension of both knees, difficulty with abduction of both hips also has scoliosis  PHQ2/9: Depression screen [facility] 2/9 06/03/2016 01/30/2016 07/26/2015  01/29/2015  Decreased Interest 0 0 0 0  Down, Depressed, Hopeless 0 0 0 0  PHQ - 2 Score 0 0 0 0     Fall Risk: Fall Risk  06/03/2016 01/30/2016 07/26/2015 01/29/2015  Falls in the past year? No No No Yes  Number falls in past yr: - - - 2 or more  Injury with Fall? - - - No     Functional Status Survey: Is the patient deaf or have difficulty hearing?: No Does the patient have difficulty seeing, even when wearing glasses/contacts?: No Does the patient have difficulty concentrating, remembering, or making decisions?: No Does the patient have difficulty walking or climbing stairs?: No Does the patient have difficulty dressing  or bathing?: No Does the patient have difficulty doing errands alone such as visiting a doctor's office or shopping?: No    Assessment & Plan  1. Hypertension, benign  - losartan (COZAAR) 100 MG tablet; Take 1 tablet (100 mg total) by mouth daily.  Dispense: 90 tablet; Refill: 1  2. Controlled gout  - allopurinol (ZYLOPRIM) 100 MG tablet; Take 1 tablet (100 mg total) by mouth 2 (two) times daily.  Dispense: 180 tablet; Refill: 1  3. GAD (generalized anxiety disorder)  - ALPRAZolam (XANAX) 0.5 MG tablet; Take 0.5-1 tablets (0.25-0.5 mg total) by mouth at bedtime as needed. for anxiety  Dispense: 30 tablet; Refill: 3  4. Gait instability  Using cane and completed PT   5. Dyslipidemia  She is only taking one capsule of Lovaza daily, explained importance of going up on the dose  6. Hyperglycemia  Last hgbA1C was normal   7. Other bilateral secondary osteoarthritis of hip  Stable. Still using a cane, no recent falls  8. Need for vaccination for Strep pneumoniae  - Pneumococcal polysaccharide vaccine 23-valent greater than or equal to 2yo subcutaneous/IM   9. Paresthesia of foot, bilateral  - B12 and Folate Panel

## 2016-06-04 LAB — FOLATE

## 2016-06-04 LAB — VITAMIN B12: Vitamin B-12: 495 pg/mL (ref 200–1100)

## 2016-06-24 ENCOUNTER — Other Ambulatory Visit: Payer: Self-pay

## 2016-06-24 DIAGNOSIS — C50412 Malignant neoplasm of upper-outer quadrant of left female breast: Secondary | ICD-10-CM

## 2016-06-24 DIAGNOSIS — Z1231 Encounter for screening mammogram for malignant neoplasm of breast: Secondary | ICD-10-CM

## 2016-08-15 ENCOUNTER — Other Ambulatory Visit: Payer: Self-pay | Admitting: Family Medicine

## 2016-08-15 DIAGNOSIS — I10 Essential (primary) hypertension: Secondary | ICD-10-CM

## 2016-08-15 NOTE — Telephone Encounter (Signed)
Patient requesting refill of Losartan to [facility].  

## 2016-08-28 ENCOUNTER — Other Ambulatory Visit: Payer: Self-pay | Admitting: Family Medicine

## 2016-08-28 DIAGNOSIS — M109 Gout, unspecified: Secondary | ICD-10-CM

## 2016-08-28 MED ORDER — COLCHICINE 0.6 MG PO TABS: 1.2000 mg | ORAL_TABLET | Freq: Every day | ORAL | 0 refills | Status: DC | PRN

## 2016-08-28 NOTE — Telephone Encounter (Signed)
PT SAID THAT SHE NEEDS A REFILL ON HER GOUT MEDICATION COLCHICINE. SAID THAT THE DR HAS NOT HAD TO GIVE THIS TO HER IN AWHILE BUT GOT UP THIS MORNING WITH A FLARE UP OF IT. PHARM IS [facility]

## 2016-09-01 ENCOUNTER — Ambulatory Visit
Admission: RE | Admit: 2016-09-01 | Discharge: 2016-09-01 | Disposition: A | Discharge status: Home / Self Care | Payer: Medicare Other | Source: Ambulatory Visit | Attending: General Surgery | Admitting: General Surgery

## 2016-09-01 DIAGNOSIS — Z1231 Encounter for screening mammogram for malignant neoplasm of breast: Secondary | ICD-10-CM | POA: Insufficient documentation

## 2016-09-01 HISTORY — DX: Malignant neoplasm of unspecified site of unspecified female breast: C50.919

## 2016-09-10 ENCOUNTER — Ambulatory Visit (INDEPENDENT_AMBULATORY_CARE_PROVIDER_SITE_OTHER): Payer: Medicare Other | Admitting: General Surgery

## 2016-09-10 ENCOUNTER — Encounter: Payer: Self-pay | Admitting: General Surgery

## 2016-09-10 VITALS — BP 124/80 | HR 79 | Resp 12 | Ht <= 58 in | Wt 128.0 lb

## 2016-09-10 DIAGNOSIS — Z853 Personal history of malignant neoplasm of breast: Secondary | ICD-10-CM | POA: Diagnosis not present

## 2016-09-10 NOTE — Progress Notes (Signed)
Patient ID: Felicia Robinson, female   DOB: 02-Dec-1945, 71 y.o.   MRN: [id number]  Chief Complaint  Patient presents with  . Follow-up    HPI Felicia Robinson is a 71 y.o. female who presents for a breast evaluation. The most recent right breast  mammogram was done on 09/01/2016.  Patient does perform regular self breast checks and gets regular mammograms done.    HPI  Past Medical History:  Diagnosis Date  . Breast cancer ([place]) 2013   left breast  . Cancer ([place]) 2013   L breast  . Disorders of phosphorus metabolism    rickets  . Fibroadenosis of breast   . Gout 2013  . Hypertension 2009  . Malignant neoplasm of upper-outer quadrant of female breast ([place]) 09/30/2011   7 mm low-grade invasive mammary carcinoma with focal DCIS. ER 90%, PR 1%, HER-2/neu not overexpressing.  . Osteomalacia   . Shingles 2013    Past Surgical History:  Procedure Laterality Date  . BREAST SURGERY Left 09/30/2011   Left simple mastectomy with sentinel node biopsy  . CATARACT EXTRACTION, BILATERAL Bilateral 04/2015   Dr. Tommy Rainwater in [place]  . COLONOSCOPY  2012  . DILATION AND CURETTAGE OF UTERUS  1982  . MASTECTOMY Left 2013   with SN biopsy  . TONSILLECTOMY  1953  . UPPER GI ENDOSCOPY  2012    Family History  Problem Relation Age of Onset  . Lymphoma Brother 58       Hodgkin's Lymphoma  . Cancer Maternal Uncle 66       cancer of the esophagus    Social History Social History  Substance Use Topics  . Smoking status: Former Smoker    Packs/day: 0.50    Years: 30.00    Types: Cigarettes    Quit date: 04/21/2006  . Smokeless tobacco: Never Used  . Alcohol use Yes    Allergies  Allergen Reactions  . Codeine Nausea And Vomiting  . Other     Pain medications Altered mental status  . Shellfish Allergy Diarrhea and Nausea And Vomiting    Just when eating clams    Current Outpatient Prescriptions  Medication Sig Dispense Refill  . allopurinol (ZYLOPRIM) 100 MG tablet Take 1 tablet  (100 mg total) by mouth 2 (two) times daily. 180 tablet 1  . ALPRAZolam (XANAX) 0.5 MG tablet Take 0.5-1 tablets (0.25-0.5 mg total) by mouth at bedtime as needed. for anxiety 30 tablet 3  . aspirin 81 MG chewable tablet Chew 81 mg by mouth daily.    Marland Kitchen BYSTOLIC 10 MG tablet TAKE 1 TABLET(10 MG) BY MOUTH DAILY 90 tablet 0  . calcium carbonate (OS-CAL) 600 MG TABS Take 600 mg by mouth daily.    . colchicine 0.6 MG tablet Take 2 tablets (1.2 mg total) by mouth daily as needed. Max 1.'8mg'$ /24hrs 30 tablet 0  . losartan (COZAAR) 100 MG tablet TAKE 1 TABLET BY MOUTH DAILY 90 tablet 0  . Multiple Vitamins-Minerals (CENTRUM SILVER ADULT 50+ PO) Take 1 tablet by mouth daily.     Marland Kitchen omega-3 acid ethyl esters (LOVAZA) 1 g capsule Take 2 capsules (2 g total) by mouth 2 (two) times daily. 120 capsule 5  . tamoxifen (NOLVADEX) 10 MG tablet TAKE 1 TABLET BY MOUTH EVERY DAY 90 tablet 3   No current facility-administered medications for this visit.     Review of Systems Review of Systems  Constitutional: Negative.   Respiratory: Negative.   Cardiovascular: Negative.  Blood pressure 124/80, pulse 79, resp. rate 12, height '4\' 7"'$  (1.397 m), weight 128 lb (58.1 kg).  Physical Exam Physical Exam  Constitutional: She is oriented to person, place, and time. She appears well-developed and well-nourished.  Eyes: Conjunctivae are normal. No scleral icterus.  Neck: Neck supple.  Cardiovascular: Normal rate, regular rhythm and normal heart sounds.   Pulmonary/Chest: Effort normal and breath sounds normal. Right breast exhibits no inverted nipple, no mass, no nipple discharge, no skin change and no tenderness.    Left mastectomy site is clean and well healed.  Lymphadenopathy:    She has no cervical adenopathy.    She has no axillary adenopathy.  Neurological: She is alert and oriented to person, place, and time.  Skin: Skin is warm and dry.    Data Reviewed Screening mammogram of the right breast dated  09/01/2016 was reviewed. No interval change. BI-RADS-1.  Assessment    Doing well now 5 years after left mastectomy for a T1b, N0 carcinoma.  Completing 5 years of antiestrogen therapy.    Plan    The patient had a low-grade tumor, but we will see if we get a breast cancer index determination to assess whether she would be benefited by additional antiestrogen therapy.  At this time, she'll complete her presence supply of tamoxifen.  The patient was offered the opportunity to have her annual exams and mammograms completed by her PCP. At this time, she'll return here in at least one year for reassessment.    The patient has been asked to return to the office in one year with a right breast screening mammogram.  HPI, Physical Exam, Assessment and Plan have been scribed under the direction and in the presence of Hervey Ard, MD.  Gaspar Cola, CMA  I have completed the exam and reviewed the above documentation for accuracy and completeness.  I agree with the above.  Haematologist has been used and any errors in dictation or transcription are unintentional.  Hervey Ard, M.D., F.A.C.S.  Robert Bellow 09/10/2016, 8:57 PM

## 2016-09-10 NOTE — Patient Instructions (Signed)
The patient has been asked to return to the office in one year with a right breast screening mammogram.

## 2016-09-11 ENCOUNTER — Telehealth: Payer: Self-pay | Admitting: *Deleted

## 2016-09-11 NOTE — Telephone Encounter (Signed)
-----   Message from Robert Bellow, MD sent at 09/10/2016  9:01 PM EDT ----- Please have the lab send off a sample for a brest cancer index (BCI) analysis. Thank you

## 2016-09-11 NOTE — Telephone Encounter (Signed)
Order faxed to [facility] for breast cancer Index

## 2016-09-13 ENCOUNTER — Other Ambulatory Visit: Payer: Self-pay | Admitting: Family Medicine

## 2016-09-13 DIAGNOSIS — I10 Essential (primary) hypertension: Secondary | ICD-10-CM

## 2016-09-14 NOTE — Telephone Encounter (Signed)
Last BP and pulse reviewed; Rx approved 

## 2016-09-30 ENCOUNTER — Encounter: Payer: Self-pay | Admitting: General Surgery

## 2016-10-01 ENCOUNTER — Telehealth: Payer: Self-pay

## 2016-10-01 NOTE — Telephone Encounter (Signed)
-----   Message from Robert Bellow, MD sent at 09/30/2016  9:03 PM EDT ----- Please notify the patient that her testing showed that she would benefit with a lower risk of recurrent cancer if she continued on her present tamoxifen. I informally discussed this with medical oncology service as well. If she doesn't mind, I would like her to continue on her present dose. Thank you ----- Message ----- From: Darrin Nipper Sent: 09/30/2016   6:48 PM To: Robert Bellow, MD

## 2016-10-03 ENCOUNTER — Ambulatory Visit: Payer: Medicare Other | Admitting: Family Medicine

## 2016-10-03 NOTE — Telephone Encounter (Signed)
Notified patient as instructed, patient pleased, she is amendable to continuing with the Tamoxifen.

## 2016-10-06 ENCOUNTER — Ambulatory Visit: Payer: Medicare Other | Admitting: Family Medicine

## 2016-10-08 ENCOUNTER — Ambulatory Visit: Payer: Medicare Other | Admitting: Family Medicine

## 2016-10-15 ENCOUNTER — Encounter: Payer: Self-pay | Admitting: Family Medicine

## 2016-10-15 ENCOUNTER — Ambulatory Visit (INDEPENDENT_AMBULATORY_CARE_PROVIDER_SITE_OTHER): Payer: Medicare Other | Admitting: Family Medicine

## 2016-10-15 VITALS — BP 146/86 | HR 70 | Temp 97.5°F | Resp 16 | Ht <= 58 in | Wt 128.8 lb

## 2016-10-15 DIAGNOSIS — R739 Hyperglycemia, unspecified: Secondary | ICD-10-CM

## 2016-10-15 DIAGNOSIS — I10 Essential (primary) hypertension: Secondary | ICD-10-CM

## 2016-10-15 DIAGNOSIS — E781 Pure hyperglyceridemia: Secondary | ICD-10-CM

## 2016-10-15 DIAGNOSIS — R2681 Unsteadiness on feet: Secondary | ICD-10-CM | POA: Diagnosis not present

## 2016-10-15 DIAGNOSIS — F411 Generalized anxiety disorder: Secondary | ICD-10-CM | POA: Diagnosis not present

## 2016-10-15 MED ORDER — LOSARTAN POTASSIUM 100 MG PO TABS: 100.0000 mg | ORAL_TABLET | Freq: Every day | ORAL | 1 refills | Status: DC

## 2016-10-15 MED ORDER — ALPRAZOLAM 0.5 MG PO TABS: 0.2500 mg | ORAL_TABLET | Freq: Every evening | ORAL | 3 refills | Status: DC | PRN

## 2016-10-15 NOTE — Progress Notes (Signed)
Name: Felicia Robinson   MRN: [id number]    DOB: Aug 31, 1945   Date:10/15/2016       Progress Note  Subjective  Chief Complaint  Chief Complaint  Patient presents with  . Medication Refill  . Hypertension    Edema if in her feet if she is on them all day  . Anxiety    Well controlled  . Gout    No flairs up since last year    HPI  HTN: she has hypertension and bp is at goal .She denies chest pain or SOB.   Gait instability/Recurrent Falls: she had PT and is doing better, only uses cane when not at home. No recent falls.  GAD: taking alprazolam, she worries a lot, able to sleep and function with alprazolam and denies side effects. Discussed mindful exercises. She is doing better, only taking half pill prn when she takes care of her aunt, and half at night, goal is to take 0.25 mg qhs only.  Gout:  She has not been taking colcrys only taking Allopurinol once daily, but uric acid was high, therefore she will increase Allopurinol to twice a day   Breast Cancer left side: dx in 2013, still on Tamoxifen, only 10 mg because that is the dose she can tolerate and sees Dr. Fleet Contras. She completed 5 years Spring 2018  Disorder of phosphorus metabolism: used to see Endocrinologist and is doing well at this time. Recheck labs yearly    Patient Active Problem List   Diagnosis Date Noted  . Osteoarthritis, knee 07/26/2015  . Other bilateral secondary osteoarthritis of hip 07/26/2015  . Hypertension, benign 01/29/2015  . Controlled gout 01/29/2015  . Disorder of phosphorus metabolism 01/29/2015  . GAD (generalized anxiety disorder) 01/29/2015  . Gait instability 01/29/2015  . Recurrent falls 01/29/2015  . Acquired scoliosis 01/29/2015  . History of breast cancer in female 09/08/2012  . H/O rickets 09/08/2012  . History of shingles 12/06/2011    Past Surgical History:  Procedure Laterality Date  . BREAST SURGERY Left 09/30/2011   Left simple mastectomy with sentinel node biopsy   . CATARACT EXTRACTION, BILATERAL Bilateral 04/2015   Dr. Tommy Rainwater in [place]  . COLONOSCOPY  2012  . DILATION AND CURETTAGE OF UTERUS  1982  . MASTECTOMY Left 2013   with SN biopsy  . TONSILLECTOMY  1953  . UPPER GI ENDOSCOPY  2012    Family History  Problem Relation Age of Onset  . Lymphoma Brother 59       Hodgkin's Lymphoma  . Cancer Maternal Uncle 49       cancer of the esophagus    Social History   Social History  . Marital status: Married    Spouse name: N/A  . Number of children: N/A  . Years of education: N/A   Occupational History  . Not on file.   Social History Main Topics  . Smoking status: Former Smoker    Packs/day: 0.50    Years: 30.00    Types: Cigarettes    Quit date: 04/21/2006  . Smokeless tobacco: Never Used  . Alcohol use Yes  . Drug use: No  . Sexual activity: Not on file   Other Topics Concern  . Not on file   Social History Narrative  . No narrative on file     Current Outpatient Prescriptions:  .  ALPRAZolam (XANAX) 0.5 MG tablet, Take 0.5-1 tablets (0.25-0.5 mg total) by mouth at bedtime as needed. for anxiety, Disp: 25 tablet,  Rfl: 3 .  aspirin 81 MG chewable tablet, Chew 81 mg by mouth daily., Disp: , Rfl:  .  BYSTOLIC 10 MG tablet, TAKE 1 TABLET(10 MG) BY MOUTH DAILY, Disp: 90 tablet, Rfl: 0 .  calcium carbonate (OS-CAL) 600 MG TABS, Take 600 mg by mouth daily., Disp: , Rfl:  .  colchicine 0.6 MG tablet, Take 2 tablets (1.2 mg total) by mouth daily as needed. Max 1.8mg Bailey Mech, Disp: 30 tablet, Rfl: 0 .  losartan (COZAAR) 100 MG tablet, Take 1 tablet (100 mg total) by mouth daily., Disp: 90 tablet, Rfl: 1 .  Multiple Vitamins-Minerals (CENTRUM SILVER ADULT 50+ PO), Take 1 tablet by mouth daily. , Disp: , Rfl:  .  omega-3 acid ethyl esters (LOVAZA) 1 g capsule, Take 2 capsules (2 g total) by mouth 2 (two) times daily., Disp: 120 capsule, Rfl: 5  Allergies  Allergen Reactions  . Allopurinol     diarrhea  . Codeine Nausea And  Vomiting  . Other     Pain medications Altered mental status  . Shellfish Allergy Diarrhea and Nausea And Vomiting    Just when eating clams     ROS  Constitutional: Negative for fever or weight change.  Respiratory: Negative for cough and shortness of breath.   Cardiovascular: Negative for chest pain or palpitations.  Gastrointestinal: Negative for abdominal pain, no bowel changes.  Musculoskeletal: Positive for gait problem and intermittent  joint swelling.  Skin: Negative for rash.   Neurological: Negative for dizziness or headache.  No other specific complaints in a complete review of systems (except as listed in HPI above).  Objective  Vitals:   10/15/16 1031  BP: (!) 146/86  Pulse: 70  Resp: 16  Temp: 97.5 F (36.4 C)  TempSrc: Oral  SpO2: 95%  Weight: 128 lb 12.8 oz (58.4 kg)  Height: 4\' 7"  (1.397 m)    Body mass index is 29.94 kg/m.  Physical Exam  Constitutional: Patient appears well-developed and well-nourished. Overweight. No distress.  HEENT: head atraumatic, normocephalic, pupils equal and reactive to light, neck supple, throat within normal limits Cardiovascular: Normal rate, regular rhythm and normal heart sounds. No murmur heard. No BLE edema. Pulmonary/Chest: Effort normal and breath sounds normal. No respiratory distress. Abdominal: Soft. There is no tenderness. Psychiatric: Patient has a normal mood and affect. behavior is normal. Judgment and thought content normal. Muscular Skeletal: stiff when walking, crepitus with extension of both knees, difficulty with abduction of both hips also has scoliosis  PHQ2/9: Depression screen [facility] 2/9 10/15/2016 06/03/2016 01/30/2016 07/26/2015 01/29/2015  Decreased Interest 0 0 0 0 0  Down, Depressed, Hopeless 0 0 0 0 0  PHQ - 2 Score 0 0 0 0 0    Fall Risk: Fall Risk  10/15/2016 06/03/2016 01/30/2016 07/26/2015 01/29/2015  Falls in the past year? No No No No Yes  Number falls in past yr: - - - - 2 or more   Injury with Fall? - - - - No    Functional Status Survey: Is the patient deaf or have difficulty hearing?: No Does the patient have difficulty seeing, even when wearing glasses/contacts?: No Does the patient have difficulty concentrating, remembering, or making decisions?: No Does the patient have difficulty walking or climbing stairs?: No Does the patient have difficulty dressing or bathing?: No Does the patient have difficulty doing errands alone such as visiting a doctor's office or shopping?: No   Assessment & Plan  1. Hypertension, benign  - losartan (COZAAR) 100 MG tablet;  Take 1 tablet (100 mg total) by mouth daily.  Dispense: 90 tablet; Refill: 1  2. GAD (generalized anxiety disorder)  - ALPRAZolam (XANAX) 0.5 MG tablet; Take 0.5-1 tablets (0.25-0.5 mg total) by mouth at bedtime as needed. for anxiety  Dispense: 25 tablet; Refill: 3  3. Gait instability  Using a cane when not at home  4. Hypertriglyceridemia  Down to 1 g twice daily we will recheck labs on her next visit  5. Hyperglycemia  Last hgbA1C was at goal  6. Disorder of phosphorus metabolism  Last labs were good recheck yearly

## 2017-02-02 ENCOUNTER — Ambulatory Visit: Payer: Medicare Other

## 2017-02-02 ENCOUNTER — Encounter: Payer: Medicare Other | Admitting: Family Medicine

## 2017-02-23 ENCOUNTER — Ambulatory Visit: Payer: Medicare Other | Admitting: Family Medicine

## 2017-02-23 ENCOUNTER — Encounter: Payer: Self-pay | Admitting: Family Medicine

## 2017-02-23 VITALS — BP 150/90 | HR 84 | Resp 14 | Ht <= 58 in | Wt 125.8 lb

## 2017-02-23 DIAGNOSIS — R739 Hyperglycemia, unspecified: Secondary | ICD-10-CM | POA: Diagnosis not present

## 2017-02-23 DIAGNOSIS — Z23 Encounter for immunization: Secondary | ICD-10-CM

## 2017-02-23 DIAGNOSIS — E781 Pure hyperglyceridemia: Secondary | ICD-10-CM | POA: Diagnosis not present

## 2017-02-23 DIAGNOSIS — M109 Gout, unspecified: Secondary | ICD-10-CM | POA: Diagnosis not present

## 2017-02-23 DIAGNOSIS — E539 Vitamin B deficiency, unspecified: Secondary | ICD-10-CM

## 2017-02-23 DIAGNOSIS — F411 Generalized anxiety disorder: Secondary | ICD-10-CM

## 2017-02-23 DIAGNOSIS — M159 Polyosteoarthritis, unspecified: Secondary | ICD-10-CM

## 2017-02-23 DIAGNOSIS — M150 Primary generalized (osteo)arthritis: Secondary | ICD-10-CM | POA: Diagnosis not present

## 2017-02-23 DIAGNOSIS — E785 Hyperlipidemia, unspecified: Secondary | ICD-10-CM | POA: Diagnosis not present

## 2017-02-23 DIAGNOSIS — I10 Essential (primary) hypertension: Secondary | ICD-10-CM

## 2017-02-23 DIAGNOSIS — E538 Deficiency of other specified B group vitamins: Secondary | ICD-10-CM

## 2017-02-23 MED ORDER — NEBIVOLOL HCL 10 MG PO TABS: 5.0000 mg | ORAL_TABLET | Freq: Every day | ORAL | 0 refills | Status: DC

## 2017-02-23 MED ORDER — OMEGA-3-ACID ETHYL ESTERS 1 G PO CAPS: 2.0000 g | ORAL_CAPSULE | Freq: Two times a day (BID) | ORAL | 5 refills | Status: DC

## 2017-02-23 MED ORDER — DULOXETINE HCL 30 MG PO CPEP: 30.0000 mg | ORAL_CAPSULE | Freq: Every day | ORAL | 0 refills | Status: DC

## 2017-02-23 MED ORDER — LOSARTAN POTASSIUM 100 MG PO TABS: 100.0000 mg | ORAL_TABLET | Freq: Every day | ORAL | 1 refills | Status: DC

## 2017-02-23 NOTE — Progress Notes (Signed)
Name: Felicia Robinson   MRN: [id number]    DOB: 1945/07/24   Date:02/23/2017       Progress Note  Subjective  Chief Complaint  Chief Complaint  Patient presents with  . Hypertension  . Anxiety  . Medication Refill    HPI  HTN: she has hypertension, bp at home has been well controlled 128-140/60's-72's, today it is high but she has some white coat hypertension also.She denies chest pain or SOB.   Gait instability/Recurrent Falls: she had PT and is doing better. No recent falls.Using cane  GAD: taking alprazolam, she worries a lot, able to sleep and function with alprazolam and denies side effects. Discussed mindful exercises. She is doing better, no longer taking it during the day as often, she has been under more stress, still carrying for her aging aunt, and also had some distraction [place]. We will try Duloxetine  Gout: She has not been taking colcrys prn, she had to stop Allopurinol because it caused diarrhea, we will recheck level and consider Uloric if uric acid is high.   Breast Cancer left side: dx in 2013, she finished Tamoxifen, completed 5 years Spring 2018. She still follows up with Dr. Fleet Contras.   Disorder of phosphorus metabolism: used to see Endocrinologist and is doing well at this time. Recheck labs today   Burning feet : she has intermittent burning sensation on both of her feet, not daily, discussed options, gabapentin, lyrica and duloxetine. She is trying to avoid walking bear footed. She would like to try duloxetine.      Patient Active Problem List   Diagnosis Date Noted  . Osteoarthritis, knee 07/26/2015  . Other bilateral secondary osteoarthritis of hip 07/26/2015  . Hypertension, benign 01/29/2015  . Controlled gout 01/29/2015  . Disorder of phosphorus metabolism 01/29/2015  . GAD (generalized anxiety disorder) 01/29/2015  . Gait instability 01/29/2015  . Recurrent falls 01/29/2015  . Acquired scoliosis 01/29/2015  . History of breast  cancer in female 09/08/2012  . H/O rickets 09/08/2012  . History of shingles 12/06/2011    Past Surgical History:  Procedure Laterality Date  . BREAST SURGERY Left 09/30/2011   Left simple mastectomy with sentinel node biopsy  . CATARACT EXTRACTION, BILATERAL Bilateral 04/2015   Dr. Tommy Rainwater in [place]  . COLONOSCOPY  2012  . DILATION AND CURETTAGE OF UTERUS  1982  . MASTECTOMY Left 2013   with SN biopsy  . TONSILLECTOMY  1953  . UPPER GI ENDOSCOPY  2012    Family History  Problem Relation Age of Onset  . Lymphoma Brother 32       Hodgkin's Lymphoma  . Cancer Maternal Uncle 50       cancer of the esophagus    Social History   Socioeconomic History  . Marital status: Married    Spouse name: Not on file  . Number of children: Not on file  . Years of education: Not on file  . Highest education level: Not on file  Social Needs  . Financial resource strain: Not on file  . Food insecurity - worry: Not on file  . Food insecurity - inability: Not on file  . Transportation needs - medical: Not on file  . Transportation needs - non-medical: Not on file  Occupational History  . Not on file  Tobacco Use  . Smoking status: Former Smoker    Packs/day: 0.50    Years: 30.00    Pack years: 15.00    Types: Cigarettes  Last attempt to quit: 04/21/2006    Years since quitting: 10.8  . Smokeless tobacco: Never Used  Substance and Sexual Activity  . Alcohol use: Yes  . Drug use: No  . Sexual activity: Not on file  Other Topics Concern  . Not on file  Social History Narrative  . Not on file     Current Outpatient Medications:  .  ALPRAZolam (XANAX) 0.5 MG tablet, Take 0.5-1 tablets (0.25-0.5 mg total) by mouth at bedtime as needed. for anxiety, Disp: 25 tablet, Rfl: 3 .  aspirin 81 MG chewable tablet, Chew 81 mg by mouth daily., Disp: , Rfl:  .  BYSTOLIC 10 MG tablet, TAKE 1 TABLET(10 MG) BY MOUTH DAILY, Disp: 90 tablet, Rfl: 0 .  calcium carbonate (OS-CAL) 600 MG TABS,  Take 600 mg by mouth daily., Disp: , Rfl:  .  colchicine 0.6 MG tablet, Take 2 tablets (1.2 mg total) by mouth daily as needed. Max 1.8mg Bailey Mech, Disp: 30 tablet, Rfl: 0 .  losartan (COZAAR) 100 MG tablet, Take 1 tablet (100 mg total) by mouth daily., Disp: 90 tablet, Rfl: 1 .  Multiple Vitamins-Minerals (CENTRUM SILVER ADULT 50+ PO), Take 1 tablet by mouth daily. , Disp: , Rfl:  .  omega-3 acid ethyl esters (LOVAZA) 1 g capsule, Take 2 capsules (2 g total) by mouth 2 (two) times daily., Disp: 120 capsule, Rfl: 5  Allergies  Allergen Reactions  . Allopurinol     diarrhea  . Codeine Nausea And Vomiting  . Other     Pain medications Altered mental status  . Shellfish Allergy Diarrhea and Nausea And Vomiting    Just when eating clams     ROS  Constitutional: Negative for fever or weight change.  Respiratory: Negative for cough and shortness of breath.   Cardiovascular: Negative for chest pain or palpitations.  Gastrointestinal: Negative for abdominal pain, no bowel changes.  Musculoskeletal: Positive  for gait problem and intermittent  joint swelling.  Skin: Negative for rash.  Neurological: Negative for dizziness or headache.  No other specific complaints in a complete review of systems (except as listed in HPI above).  Objective  Vitals:   02/23/17 1340  BP: (!) 150/90  Pulse: 84  Resp: 14  SpO2: 97%  Weight: 125 lb 12.8 oz (57.1 kg)  Height: 4\' 7"  (1.397 m)    Body mass index is 29.24 kg/m.  Physical Exam  Constitutional: Patient appears well-developed and well-nourished. Overweight. No distress.  HEENT: head atraumatic, normocephalic, pupils equal and reactive to light, neck supple, throat within normal limits Cardiovascular: Normal rate, regular rhythm and normal heart sounds. No murmur heard. No BLE edema. Pulmonary/Chest: Effort normal and breath sounds normal. No respiratory distress. Abdominal: Soft. There is no tenderness. Psychiatric: Patient has a normal  mood and affect. behavior is normal. Judgment and thought content normal. Muscular Skeletal: stiff when walking, crepitus with extension of both knees, difficulty with abduction of both hips also has scoliosis   PHQ2/9: Depression screen [facility] 2/9 10/15/2016 06/03/2016 01/30/2016 07/26/2015 01/29/2015  Decreased Interest 0 0 0 0 0  Down, Depressed, Hopeless 0 0 0 0 0  PHQ - 2 Score 0 0 0 0 0     Fall Risk: Fall Risk  10/15/2016 06/03/2016 01/30/2016 07/26/2015 01/29/2015  Falls in the past year? No No No No Yes  Number falls in past yr: - - - - 2 or more  Injury with Fall? - - - - No      Assessment &  Plan  1. Hypertension, benign  bp is elevated, she has white coat syndrome medication - losartan (COZAAR) 100 MG tablet; Take 1 tablet (100 mg total) daily by mouth.  Dispense: 90 tablet; Refill: 1 - nebivolol (BYSTOLIC) 10 MG tablet; Take 0.5-1 tablets (5-10 mg total) daily by mouth.  Dispense: 90 tablet; Refill: 0 - COMPLETE METABOLIC PANEL WITH GFR - CBC with Differential/Platelet  2. Flu vaccine need  - Flu vaccine HIGH DOSE PF  3. GAD (generalized anxiety disorder)  Unde stress [place]  - DULoxetine (CYMBALTA) 30 MG capsule; Take 1-2 capsules (30-60 mg total) daily by mouth. One first week and after that 2 capsules daily  Dispense: 60 capsule; Refill: 0  4. Hypertriglyceridemia  Only taking 1 g of fish oil twice daily we will recheck labs  5. Hyperglycemia  - Hemoglobin A1c  6. Controlled gout  - Uric acid  7. Disorder of phosphorus metabolism  - VITAMIN D 25 Hydroxy (Vit-D Deficiency, Fractures) - COMPLETE METABOLIC PANEL WITH GFR  8. Dyslipidemia  - omega-3 acid ethyl esters (LOVAZA) 1 g capsule; Take 2 capsules (2 g total) 2 (two) times daily by mouth.  Dispense: 120 capsule; Refill: 5 - Lipid panel  9. B12 deficiency  - Vitamin B12  10. Burning feet syndrome  - DULoxetine (CYMBALTA) 30 MG capsule; Take 1-2 capsules (30-60 mg  total) daily by mouth. One first week and after that 2 capsules daily  Dispense: 60 capsule; Refill: 0

## 2017-02-27 LAB — CBC WITH DIFFERENTIAL/PLATELET
BASOS PCT: 0.6 %
Basophils Absolute: 43 cells/uL (ref 0–200)
EOS PCT: 2.9 %
Eosinophils Absolute: 209 cells/uL (ref 15–500)
HEMATOCRIT: 39.3 % (ref 35.0–45.0)
Hemoglobin: 13.3 g/dL (ref 11.7–15.5)
LYMPHS ABS: 1562 {cells}/uL (ref 850–3900)
MCH: 29.8 pg (ref 27.0–33.0)
MCHC: 33.8 g/dL (ref 32.0–36.0)
MCV: 87.9 fL (ref 80.0–100.0)
MPV: 11.6 fL (ref 7.5–12.5)
Monocytes Relative: 6 %
NEUTROS PCT: 68.8 %
Neutro Abs: 4954 cells/uL (ref 1500–7800)
PLATELETS: 210 10*3/uL (ref 140–400)
RBC: 4.47 10*6/uL (ref 3.80–5.10)
RDW: 12.4 % (ref 11.0–15.0)
Total Lymphocyte: 21.7 %
WBC: 7.2 10*3/uL (ref 3.8–10.8)
WBCMIX: 432 {cells}/uL (ref 200–950)

## 2017-02-27 LAB — LIPID PANEL
CHOL/HDL RATIO: 3.3 (calc) (ref <5.0)
Cholesterol: 193 mg/dL (ref <200)
HDL: 58 mg/dL (ref >50)
LDL CHOLESTEROL (CALC): 106 mg/dL — AB
Non-HDL Cholesterol (Calc): 135 mg/dL (calc) — ABNORMAL HIGH (ref <130)
TRIGLYCERIDES: 169 mg/dL — AB (ref <150)

## 2017-02-27 LAB — COMPLETE METABOLIC PANEL WITH GFR
AG Ratio: 1.5 (calc) (ref 1.0–2.5)
ALKALINE PHOSPHATASE (APISO): 165 U/L — AB (ref 33–130)
ALT: 13 U/L (ref 6–29)
AST: 21 U/L (ref 10–35)
Albumin: 4.2 g/dL (ref 3.6–5.1)
BILIRUBIN TOTAL: 0.5 mg/dL (ref 0.2–1.2)
BUN: 15 mg/dL (ref 7–25)
CHLORIDE: 100 mmol/L (ref 98–110)
CO2: 25 mmol/L (ref 20–32)
CREATININE: 0.78 mg/dL (ref 0.60–0.93)
Calcium: 9.3 mg/dL (ref 8.6–10.4)
GFR, Est African American: 89 mL/min/{1.73_m2} (ref >=60)
GFR, Est Non African American: 77 mL/min/{1.73_m2} (ref >=60)
GLUCOSE: 101 mg/dL — AB (ref 65–99)
Globulin: 2.8 g/dL (calc) (ref 1.9–3.7)
Potassium: 4.6 mmol/L (ref 3.5–5.3)
Sodium: 133 mmol/L — ABNORMAL LOW (ref 135–146)
Total Protein: 7 g/dL (ref 6.1–8.1)

## 2017-02-27 LAB — HEMOGLOBIN A1C
HEMOGLOBIN A1C: 5 %{Hb} (ref <5.7)
MEAN PLASMA GLUCOSE: 97 (calc)
eAG (mmol/L): 5.4 (calc)

## 2017-02-27 LAB — VITAMIN B12: Vitamin B-12: 391 pg/mL (ref 200–1100)

## 2017-02-27 LAB — URIC ACID: Uric Acid, Serum: 7.1 mg/dL — ABNORMAL HIGH (ref 2.5–7.0)

## 2017-02-27 LAB — VITAMIN D 25 HYDROXY (VIT D DEFICIENCY, FRACTURES): Vit D, 25-Hydroxy: 34 ng/mL (ref 30–100)

## 2017-03-02 ENCOUNTER — Other Ambulatory Visit: Payer: Self-pay | Admitting: Family Medicine

## 2017-03-02 ENCOUNTER — Telehealth: Payer: Self-pay

## 2017-03-02 DIAGNOSIS — M25561 Pain in right knee: Secondary | ICD-10-CM

## 2017-03-02 DIAGNOSIS — M25562 Pain in left knee: Principal | ICD-10-CM

## 2017-03-02 NOTE — Telephone Encounter (Signed)
Called patient to give her lab results. While on the phone patient mentioned that her bilateral knee pain has gotten worse. She reports that her knees continue to lock and give way. Pain in her knees has increased and it is sharp shooting pain. She almost fell twice yesterday, but her husband was able to catch her. She continues to use her crutches to help her gait. She would like to know what you recommend her doing for her knees.

## 2017-03-02 NOTE — Telephone Encounter (Signed)
She would like to be seen by Rachelle Hora, MD at [facility] as soon as possible.

## 2017-03-02 NOTE — Telephone Encounter (Signed)
She needs to see Ortho, who would she like to see?

## 2017-03-05 ENCOUNTER — Other Ambulatory Visit: Payer: Self-pay | Admitting: Orthopedic Surgery

## 2017-03-09 ENCOUNTER — Other Ambulatory Visit: Payer: Self-pay | Admitting: Orthopedic Surgery

## 2017-03-09 DIAGNOSIS — S72334A Nondisplaced oblique fracture of shaft of right femur, initial encounter for closed fracture: Secondary | ICD-10-CM

## 2017-03-09 DIAGNOSIS — S72331K Displaced oblique fracture of shaft of right femur, subsequent encounter for closed fracture with nonunion: Secondary | ICD-10-CM

## 2017-03-20 ENCOUNTER — Encounter
Admission: RE | Admit: 2017-03-20 | Discharge: 2017-03-20 | Disposition: A | Discharge status: Home / Self Care | Payer: Medicare Other | Source: Ambulatory Visit | Attending: Orthopedic Surgery | Admitting: Orthopedic Surgery

## 2017-03-20 DIAGNOSIS — X58XXXA Exposure to other specified factors, initial encounter: Secondary | ICD-10-CM | POA: Insufficient documentation

## 2017-03-20 DIAGNOSIS — E550 Rickets, active: Secondary | ICD-10-CM | POA: Diagnosis not present

## 2017-03-20 DIAGNOSIS — M170 Bilateral primary osteoarthritis of knee: Secondary | ICD-10-CM | POA: Insufficient documentation

## 2017-03-20 DIAGNOSIS — K4090 Unilateral inguinal hernia, without obstruction or gangrene, not specified as recurrent: Secondary | ICD-10-CM | POA: Diagnosis not present

## 2017-03-20 DIAGNOSIS — M160 Bilateral primary osteoarthritis of hip: Secondary | ICD-10-CM | POA: Diagnosis not present

## 2017-03-20 DIAGNOSIS — S72334A Nondisplaced oblique fracture of shaft of right femur, initial encounter for closed fracture: Secondary | ICD-10-CM | POA: Diagnosis present

## 2017-03-20 MED ORDER — TECHNETIUM TC 99M MEDRONATE IV KIT
25.0000 | PACK | Freq: Once | INTRAVENOUS | Status: AC | PRN
  Administered 2017-03-20: 23.84 via INTRAVENOUS

## 2017-04-01 ENCOUNTER — Other Ambulatory Visit: Payer: Self-pay | Admitting: Family Medicine

## 2017-04-01 DIAGNOSIS — E785 Hyperlipidemia, unspecified: Secondary | ICD-10-CM

## 2017-04-01 NOTE — Telephone Encounter (Addendum)
Refill request for general medication: Lovaza 1 g  Last office visit: 02/23/2017  Last physical exam: 01/30/2016

## 2017-04-10 ENCOUNTER — Other Ambulatory Visit: Payer: Self-pay | Admitting: Family Medicine

## 2017-04-10 DIAGNOSIS — F411 Generalized anxiety disorder: Secondary | ICD-10-CM

## 2017-04-10 DIAGNOSIS — E539 Vitamin B deficiency, unspecified: Secondary | ICD-10-CM

## 2017-04-10 NOTE — Telephone Encounter (Signed)
Refill request for general medication. Cymbalta  Last office visit:02/23/2017  Called patient to ask if she was taking 2 capsules daily of Cymbalta but patient did not answer.

## 2017-04-27 DIAGNOSIS — M84351G Stress fracture, right femur, subsequent encounter for fracture with delayed healing: Secondary | ICD-10-CM | POA: Insufficient documentation

## 2017-05-18 ENCOUNTER — Telehealth: Payer: Self-pay | Admitting: Family Medicine

## 2017-05-18 NOTE — Telephone Encounter (Signed)
No drug interactation found on epocrates.

## 2017-05-18 NOTE — Telephone Encounter (Signed)
Copied from [place] [id number]. Topic: General - Other >> May 18, 2017  3:25 PM Felicia Robinson, NT wrote: Patient has a appt for 2/6 to go over a new medication her endodontologist has prescribed her on. It is an injection called Crysdita. She states she would like a call back from Dr. Ancil Boozer if she needs to discuss this with her before coming in or if she can just bring it in on the day of her appt that is fine too. Please advise and contact patient.

## 2017-05-19 NOTE — Telephone Encounter (Signed)
Patient notified of Crysvita not having any drug interactions and wanted to know if Dr. Ancil Boozer would give her the injection. 30 mg of each vial and has have both shots right after the other. If she tolerates it well it will be monthly and waiting for [employer] to send it to her. If she does not receive the medication in time she will reschedule appointment so we are able to give her this injection. Rheumatologist hopes this medication will help improve her bone mass and walk better with it. She is healing from her fractures but this medication is suppose to speed up her recovery and help her walk without her cane with it. Thank you for willing to do this. She is receiving it from the assistance from the drug company and will let us know soon as it comes in.

## 2017-05-19 NOTE — Telephone Encounter (Signed)
Unfortunately we will not be able to give the injections in out office, just asked Marnette Burgess ( our office manager about it)

## 2017-05-20 NOTE — Telephone Encounter (Signed)
Sent patient a MyChart message informing her Dr. Ancil Boozer was unable to give her this injection but she could call the prescribing physician and see if they are able to do in their office as a possible nurses visit.

## 2017-05-21 ENCOUNTER — Encounter: Payer: Self-pay | Admitting: Family Medicine

## 2017-05-23 ENCOUNTER — Other Ambulatory Visit: Payer: Self-pay | Admitting: Family Medicine

## 2017-05-23 DIAGNOSIS — F411 Generalized anxiety disorder: Secondary | ICD-10-CM

## 2017-05-26 ENCOUNTER — Other Ambulatory Visit: Payer: Self-pay | Admitting: Family Medicine

## 2017-05-26 ENCOUNTER — Encounter: Payer: Self-pay | Admitting: Family Medicine

## 2017-05-26 DIAGNOSIS — F411 Generalized anxiety disorder: Secondary | ICD-10-CM

## 2017-05-26 MED ORDER — ALPRAZOLAM 0.5 MG PO TABS: 0.2500 mg | ORAL_TABLET | Freq: Every evening | ORAL | 0 refills | Status: DC | PRN

## 2017-05-26 NOTE — Telephone Encounter (Signed)
Pt states 15 tabs will be great. Pt only takes 1/2 a day If you can do, she will see you on 3/05 Pt states thank you!!   [facility] - [place], [place] - [place] [phone] (Phone) [phone] (Fax)

## 2017-05-27 ENCOUNTER — Ambulatory Visit: Payer: Medicare Other | Admitting: Family Medicine

## 2017-05-27 ENCOUNTER — Encounter: Payer: Self-pay | Admitting: Family Medicine

## 2017-06-23 ENCOUNTER — Ambulatory Visit: Payer: Medicare Other | Admitting: Family Medicine

## 2017-06-23 ENCOUNTER — Other Ambulatory Visit: Payer: Self-pay | Admitting: Family Medicine

## 2017-06-23 ENCOUNTER — Encounter: Payer: Self-pay | Admitting: Family Medicine

## 2017-06-23 VITALS — BP 140/80 | HR 58 | Resp 14 | Ht <= 58 in | Wt 126.6 lb

## 2017-06-23 DIAGNOSIS — F411 Generalized anxiety disorder: Secondary | ICD-10-CM

## 2017-06-23 DIAGNOSIS — Z1239 Encounter for other screening for malignant neoplasm of breast: Secondary | ICD-10-CM | POA: Diagnosis not present

## 2017-06-23 DIAGNOSIS — M150 Primary generalized (osteo)arthritis: Secondary | ICD-10-CM | POA: Diagnosis not present

## 2017-06-23 DIAGNOSIS — I10 Essential (primary) hypertension: Secondary | ICD-10-CM | POA: Diagnosis not present

## 2017-06-23 DIAGNOSIS — R2681 Unsteadiness on feet: Secondary | ICD-10-CM

## 2017-06-23 DIAGNOSIS — M9080 Osteopathy in diseases classified elsewhere, unspecified site: Secondary | ICD-10-CM

## 2017-06-23 DIAGNOSIS — Z9989 Dependence on other enabling machines and devices: Secondary | ICD-10-CM

## 2017-06-23 DIAGNOSIS — M84351G Stress fracture, right femur, subsequent encounter for fracture with delayed healing: Secondary | ICD-10-CM

## 2017-06-23 DIAGNOSIS — E785 Hyperlipidemia, unspecified: Secondary | ICD-10-CM | POA: Diagnosis not present

## 2017-06-23 DIAGNOSIS — M109 Gout, unspecified: Secondary | ICD-10-CM

## 2017-06-23 DIAGNOSIS — M159 Polyosteoarthritis, unspecified: Secondary | ICD-10-CM

## 2017-06-23 MED ORDER — BUROSUMAB-TWZA 30 MG/ML ~~LOC~~ SOLN: 60.0000 mg | SUBCUTANEOUS | 0 refills | Status: DC

## 2017-06-23 MED ORDER — NEBIVOLOL HCL 10 MG PO TABS: 5.0000 mg | ORAL_TABLET | Freq: Every day | ORAL | 1 refills | Status: DC

## 2017-06-23 MED ORDER — ALPRAZOLAM 0.5 MG PO TABS: 0.2500 mg | ORAL_TABLET | Freq: Every evening | ORAL | 1 refills | Status: DC | PRN

## 2017-06-23 NOTE — Progress Notes (Signed)
Name: Felicia Robinson   MRN: [id number]    DOB: 06-17-1945   Date:06/23/2017       Progress Note  Subjective  Chief Complaint  Chief Complaint  Patient presents with  . Hypertension    HPI   HTN: she has hypertension, bp at home has been well controlled around 120's/80's She denies chest pain or SOB.   Gait instability: recurrent fall and during a Fall October 2018, pain got worse seen by Ortho found to she had distal femur stress fracture, had to be non weight bearing for months, walking with walker now. Pain is mild now  X-linked hypophosphatemia: she is seeing Dr. Delane Ginger, and is trying a medication as a trial. Getting injections at home.   GAD: taking alprazolam, she worries a lot, able to sleep and function with alprazolam and denies side effects. Discussed mindful exercises. Started on Duloxetine Nov 2018, she is only taking 30 mg but she states she is feeling better.   Gout: She has not been taking colcrys prn, she had to stop Allopurinol because it caused diarrhea.  Breast Cancer left side: dx in 2013, she finished Tamoxifen, completed5 years Spring 2018. She still follows up with Dr. Fleet Contras.   Burning feet : she has intermittent burning sensation on both of her feet, not daily, discussed options, gabapentin, lyrica and duloxetine. She has been using Benagay and has helped.     Patient Active Problem List   Diagnosis Date Noted  . Stress fracture of right femur with delayed healing 04/27/2017  . Osteoarthritis, knee 07/26/2015  . Other bilateral secondary osteoarthritis of hip 07/26/2015  . Hypertension, benign 01/29/2015  . Controlled gout 01/29/2015  . X-linked hypophosphatemic rickets 01/29/2015  . GAD (generalized anxiety disorder) 01/29/2015  . Gait instability 01/29/2015  . Recurrent falls 01/29/2015  . Acquired scoliosis 01/29/2015  . History of breast cancer in female 09/08/2012  . H/O rickets 09/08/2012  . History of shingles 12/06/2011    Past  Surgical History:  Procedure Laterality Date  . BREAST SURGERY Left 09/30/2011   Left simple mastectomy with sentinel node biopsy  . CATARACT EXTRACTION, BILATERAL Bilateral 04/2015   Dr. Tommy Rainwater in [place]  . COLONOSCOPY  2012  . DILATION AND CURETTAGE OF UTERUS  1982  . MASTECTOMY Left 2013   with SN biopsy  . TONSILLECTOMY  1953  . UPPER GI ENDOSCOPY  2012    Family History  Problem Relation Age of Onset  . Lymphoma Brother 49       Hodgkin's Lymphoma  . Cancer Maternal Uncle 42       cancer of the esophagus    Social History   Socioeconomic History  . Marital status: Married    Spouse name: Not on file  . Number of children: Not on file  . Years of education: Not on file  . Highest education level: Not on file  Social Needs  . Financial resource strain: Not on file  . Food insecurity - worry: Not on file  . Food insecurity - inability: Not on file  . Transportation needs - medical: Not on file  . Transportation needs - non-medical: Not on file  Occupational History  . Not on file  Tobacco Use  . Smoking status: Former Smoker    Packs/day: 0.50    Years: 30.00    Pack years: 15.00    Types: Cigarettes    Last attempt to quit: 04/21/2006    Years since quitting: 11.1  . Smokeless  tobacco: Never Used  Substance and Sexual Activity  . Alcohol use: Yes  . Drug use: No  . Sexual activity: Not on file  Other Topics Concern  . Not on file  Social History Narrative  . Not on file     Current Outpatient Medications:  .  ALPRAZolam (XANAX) 0.5 MG tablet, Take 0.5-1 tablets (0.25-0.5 mg total) by mouth at bedtime as needed. for anxiety, Disp: 15 tablet, Rfl: 0 .  aspirin 81 MG chewable tablet, Chew 81 mg by mouth daily., Disp: , Rfl:  .  colchicine 0.6 MG tablet, Take 2 tablets (1.2 mg total) by mouth daily as needed. Max 1.8mg Bailey Mech, Disp: 30 tablet, Rfl: 0 .  DULoxetine (CYMBALTA) 30 MG capsule, Take 1 capsule (30 mg total) by mouth daily., Disp: 90 capsule,  Rfl: 1 .  losartan (COZAAR) 100 MG tablet, Take 1 tablet (100 mg total) daily by mouth., Disp: 90 tablet, Rfl: 1 .  nebivolol (BYSTOLIC) 10 MG tablet, Take 0.5-1 tablets (5-10 mg total) daily by mouth., Disp: 90 tablet, Rfl: 0 .  omega-3 acid ethyl esters (LOVAZA) 1 g capsule, Take 2 capsules (2 g total) 2 (two) times daily by mouth., Disp: 120 capsule, Rfl: 5 .  calcium carbonate (OS-CAL) 600 MG TABS, Take 600 mg by mouth daily., Disp: , Rfl:  .  Multiple Vitamins-Minerals (CENTRUM SILVER ADULT 50+ PO), Take 1 tablet by mouth daily. , Disp: , Rfl:   Allergies  Allergen Reactions  . Allopurinol     diarrhea  . Codeine Nausea And Vomiting  . Other     Pain medications Altered mental status  . Shellfish Allergy Diarrhea and Nausea And Vomiting    Just when eating clams Just when eating clams Just when eating clams      ROS  Constitutional: Negative for fever or weight change.  Respiratory: Negative for cough and shortness of breath.   Cardiovascular: Negative for chest pain or palpitations.  Gastrointestinal: Negative for abdominal pain, no bowel changes.  Musculoskeletal: Negative for gait problem or joint swelling.  Skin: Negative for rash.  Neurological: Negative for dizziness or headache.  No other specific complaints in a complete review of systems (except as listed in HPI above).  Objective  Vitals:   06/23/17 1321  BP: 140/80  Pulse: (!) 58  Resp: 14  SpO2: 98%  Weight: 126 lb 9.6 oz (57.4 kg)  Height: 4\' 7"  (1.397 m)    Body mass index is 29.42 kg/m.  Physical Exam  Constitutional: Patient appears well-developed and well-nourished. Overweight. No distress.  HEENT: head atraumatic, normocephalic, pupils equal and reactive to light, neck supple, throat within normal limits Cardiovascular: Normal rate, regular rhythm and normal heart sounds.  No murmur heard. No BLE edema. Pulmonary/Chest: Effort normal and breath sounds normal. No respiratory  distress. Abdominal: Soft.  There is no tenderness. Psychiatric: Patient has a normal mood and affect. behavior is normal. Judgment and thought content normal. Muscular Skeletal: she was weight bearing initially, but has been using a walker about 5 months ago, still has pain but better controlled   PHQ2/9: Depression screen [facility] 2/9 10/15/2016 06/03/2016 01/30/2016 07/26/2015 01/29/2015  Decreased Interest 0 0 0 0 0  Down, Depressed, Hopeless 0 0 0 0 0  PHQ - 2 Score 0 0 0 0 0    Fall Risk: Fall Risk  06/23/2017 10/15/2016 06/03/2016 01/30/2016 07/26/2015  Falls in the past year? No No No No No  Number falls in past yr: - - - - -  Injury with Fall? - - - - -     Functional Status Survey: Is the patient deaf or have difficulty hearing?: No Does the patient have difficulty seeing, even when wearing glasses/contacts?: No Does the patient have difficulty concentrating, remembering, or making decisions?: No Does the patient have difficulty walking or climbing stairs?: No Does the patient have difficulty dressing or bathing?: No Does the patient have difficulty doing errands alone such as visiting a doctor's office or shopping?: No   Assessment & Plan  1. Hypertension, benign  - nebivolol (BYSTOLIC) 10 MG tablet; Take 0.5-1 tablets (5-10 mg total) by mouth daily.  Dispense: 30 tablet; Refill: 1  2. Dyslipidemia  Continue medication   3. Controlled gout  Still has colchicine  4. GAD (generalized anxiety disorder)  - ALPRAZolam (XANAX) 0.5 MG tablet; Take 0.5-1 tablets (0.25-0.5 mg total) by mouth at bedtime as needed. for anxiety  Dispense: 30 tablet; Refill: 1  5. Primary osteoarthritis involving multiple joints   6. Gait instability  Since fracture  7. Stress fracture of right femur with delayed healing  Seen by Ortho but unable to have surgery   8. X-linked hypophosphatemic rickets  - Burosumab-twza (CRYSVITA) 30 MG/ML SOLN; Inject 60 mg into the skin every 30 (thirty)  days.  Dispense: 2 mL; Refill: 0  We will check phosphorus - we will fax results to Virgie Dad [phone] - fax She works at [employer]. Uses walker

## 2017-07-10 ENCOUNTER — Other Ambulatory Visit: Payer: Self-pay

## 2017-07-10 DIAGNOSIS — Z1231 Encounter for screening mammogram for malignant neoplasm of breast: Secondary | ICD-10-CM

## 2017-07-14 ENCOUNTER — Telehealth: Payer: Self-pay

## 2017-07-14 LAB — PHOSPHORUS: PHOSPHORUS: 4.3 mg/dL (ref 2.1–4.3)

## 2017-07-14 NOTE — Telephone Encounter (Signed)
Erroneous Entry  

## 2017-08-10 ENCOUNTER — Other Ambulatory Visit: Payer: Self-pay

## 2017-08-10 ENCOUNTER — Other Ambulatory Visit: Payer: Self-pay | Admitting: Family Medicine

## 2017-08-10 DIAGNOSIS — M419 Scoliosis, unspecified: Secondary | ICD-10-CM

## 2017-08-10 DIAGNOSIS — M84351G Stress fracture, right femur, subsequent encounter for fracture with delayed healing: Secondary | ICD-10-CM

## 2017-08-10 DIAGNOSIS — M9080 Osteopathy in diseases classified elsewhere, unspecified site: Secondary | ICD-10-CM

## 2017-08-10 DIAGNOSIS — M166 Other bilateral secondary osteoarthritis of hip: Secondary | ICD-10-CM

## 2017-08-10 NOTE — Progress Notes (Unsigned)
Dr. Truddie Coco is requesting Phosphorus to be checked two weeks after injections for at least a 3 months span period. Please fax results to [phone]. Patient second Crysvita injection was on 07/27/2017.

## 2017-08-11 LAB — PHOSPHORUS: Phosphorus: 4 mg/dL (ref 2.1–4.3)

## 2017-09-02 ENCOUNTER — Ambulatory Visit
Admission: RE | Admit: 2017-09-02 | Discharge: 2017-09-02 | Disposition: A | Discharge status: Home / Self Care | Payer: Medicare Other | Source: Ambulatory Visit | Attending: General Surgery | Admitting: General Surgery

## 2017-09-02 DIAGNOSIS — Z1231 Encounter for screening mammogram for malignant neoplasm of breast: Secondary | ICD-10-CM | POA: Diagnosis not present

## 2017-09-04 ENCOUNTER — Telehealth: Payer: Self-pay

## 2017-09-04 DIAGNOSIS — M419 Scoliosis, unspecified: Secondary | ICD-10-CM

## 2017-09-04 DIAGNOSIS — M9080 Osteopathy in diseases classified elsewhere, unspecified site: Secondary | ICD-10-CM

## 2017-09-04 DIAGNOSIS — M84351G Stress fracture, right femur, subsequent encounter for fracture with delayed healing: Secondary | ICD-10-CM

## 2017-09-04 DIAGNOSIS — M166 Other bilateral secondary osteoarthritis of hip: Secondary | ICD-10-CM

## 2017-09-04 NOTE — Telephone Encounter (Signed)
Copied from [place] [id number]. Topic: General - Other >> Sep 04, 2017  4:03 PM Oneta Rack wrote: Relation to pt: self Call back number:[phone]  Reason for call:  Patient requesting phosphorus lab orders, patient would like to schedule lab appointment for Tuesday 09/08/17, please advise >> Sep 04, 2017  4:05 PM Oneta Rack wrote: Relation to pt: self Call back number:[phone]  Reason for call:  Patient requesting phosphorus lab orders, patient would like to schedule lab appointment for Tuesday 09/08/17, please advise

## 2017-09-06 NOTE — Telephone Encounter (Signed)
Can we do a standing order for phosphorus, she needs it for once a month

## 2017-09-07 NOTE — Telephone Encounter (Signed)
Patient notified lab slip is upfront and ready for her to have done tomorrow. Unsure if she will need it on a regular basis, thinks this is her last one but will find out when she speaks with her [facility] Endocrinologist again. But will call the day before she needs it again.

## 2017-09-08 ENCOUNTER — Ambulatory Visit: Payer: Medicare Other | Admitting: General Surgery

## 2017-09-08 ENCOUNTER — Encounter: Payer: Self-pay | Admitting: General Surgery

## 2017-09-08 VITALS — BP 156/82 | HR 66 | Resp 12 | Ht <= 58 in | Wt 125.0 lb

## 2017-09-08 DIAGNOSIS — Z853 Personal history of malignant neoplasm of breast: Secondary | ICD-10-CM | POA: Diagnosis not present

## 2017-09-08 NOTE — Patient Instructions (Addendum)
The patient is aware to call back for any questions or concerns. Patient will be asked to return to the office in one year with a right screening mammogram.

## 2017-09-08 NOTE — Progress Notes (Signed)
Patient ID: Felicia Robinson, female   DOB: 15-Jul-1945, 72 y.o.   MRN: [id number]  Chief Complaint  Patient presents with  . Follow-up    HPI Felicia Robinson is a 72 y.o. female.  who presents for her follow up breast cancer and a breast evaluation. The most recent mammogram was done on .  Patient does perform regular self breast checks and gets regular mammograms done.  No new breast issues. She is on a trial medication for history of Ricketts and she is having some joint/ bone pain.  HPI  Past Medical History:  Diagnosis Date  . Breast cancer ([place]) 2013   left breast  . Cancer ([place]) 2013   L breast  . Disorders of phosphorus metabolism    rickets  . Fibroadenosis of breast   . Gout 2013  . Hypertension 2009  . Malignant neoplasm of upper-outer quadrant of female breast ([place]) 09/30/2011   7 mm low-grade invasive mammary carcinoma with focal DCIS. ER 90%, PR 1%, HER-2/neu not overexpressing.  . Osteomalacia   . Shingles 2013    Past Surgical History:  Procedure Laterality Date  . BREAST SURGERY Left 09/30/2011   Left simple mastectomy with sentinel node biopsy  . CATARACT EXTRACTION, BILATERAL Bilateral 04/2015   Dr. Tommy Rainwater in [place]  . COLONOSCOPY  2012  . DILATION AND CURETTAGE OF UTERUS  1982  . MASTECTOMY Left 2013   with SN biopsy  . TONSILLECTOMY  1953  . UPPER GI ENDOSCOPY  2012    Family History  Problem Relation Age of Onset  . Lymphoma Brother 20       Hodgkin's Lymphoma  . Cancer Maternal Uncle 70       cancer of the esophagus  . Breast cancer Neg Hx     Social History Social History   Tobacco Use  . Smoking status: Former Smoker    Packs/day: 0.50    Years: 30.00    Pack years: 15.00    Types: Cigarettes    Last attempt to quit: 04/21/2006    Years since quitting: 11.3  . Smokeless tobacco: Never Used  Substance Use Topics  . Alcohol use: Yes  . Drug use: No    Allergies  Allergen Reactions  . Allopurinol     diarrhea  . Codeine  Nausea And Vomiting  . Other     Pain medications Altered mental status  . Shellfish Allergy Diarrhea and Nausea And Vomiting    Just when eating clams Just when eating clams Just when eating clams     Current Outpatient Medications  Medication Sig Dispense Refill  . ALPRAZolam (XANAX) 0.5 MG tablet Take 0.5-1 tablets (0.25-0.5 mg total) by mouth at bedtime as needed. for anxiety 30 tablet 1  . aspirin 81 MG chewable tablet Chew 81 mg by mouth daily.    . Burosumab-twza (CRYSVITA) 30 MG/ML SOLN Inject 60 mg into the skin every 30 (thirty) days. 2 mL 0  . DULoxetine (CYMBALTA) 30 MG capsule Take 1 capsule (30 mg total) by mouth daily. 90 capsule 1  . losartan (COZAAR) 100 MG tablet Take 1 tablet (100 mg total) daily by mouth. 90 tablet 1  . nebivolol (BYSTOLIC) 10 MG tablet Take 0.5-1 tablets (5-10 mg total) by mouth daily. 30 tablet 1  . omega-3 acid ethyl esters (LOVAZA) 1 g capsule Take 2 capsules (2 g total) 2 (two) times daily by mouth. 120 capsule 5  . colchicine 0.6 MG tablet Take 2 tablets (1.2  mg total) by mouth daily as needed. Max 1.'8mg'$ Bailey Mech (Patient not taking: Reported on 09/08/2017) 30 tablet 0   No current facility-administered medications for this visit.     Review of Systems Review of Systems  Constitutional: Negative.   Respiratory: Negative.   Cardiovascular: Negative.     Blood pressure (!) 156/82, pulse 66, resp. rate 12, height '4\' 7"'$  (1.397 m), weight 125 lb (56.7 kg).  Physical Exam Physical Exam  Constitutional: She is oriented to person, place, and time. She appears well-developed and well-nourished.  HENT:  Mouth/Throat: Oropharynx is clear and moist.  Eyes: Conjunctivae are normal. No scleral icterus.  Neck: Neck supple.  Cardiovascular: Normal rate, regular rhythm and normal heart sounds.  Pulmonary/Chest: Effort normal and breath sounds normal. Right breast exhibits no inverted nipple, no mass, no nipple discharge, no skin change and no  tenderness. Left breast exhibits no inverted nipple, no mass, no nipple discharge, no skin change and no tenderness.    Lymphadenopathy:    She has no cervical adenopathy.    She has no axillary adenopathy.       Right: No supraclavicular adenopathy present.       Left: No supraclavicular adenopathy present.  Neurological: She is alert and oriented to person, place, and time.  Skin: Skin is warm and dry.  Psychiatric: Her behavior is normal.    Data Reviewed Screening 3D mammogram of the right breast dated Sep 02, 2017 was reviewed.  BI-RADS-1.  Assessment    Benign breast exam.    Plan    Patient will be asked to return to the office in one year with a right screening mammogram. The patient is aware to call back for any questions or new concerns.  The patient had undergone BCI testing and reported September 30, 2016 and found to be a patient who would benefit from extended antiestrogen therapy.  She has not been able to tolerate this due to her rickets and recent stress fractures.       HPI, Physical Exam, Assessment and Plan have been scribed under the direction and in the presence of Robert Bellow, MD. Karie Fetch, RN  I have completed the exam and reviewed the above documentation for accuracy and completeness.  I agree with the above.  Haematologist has been used and any errors in dictation or transcription are unintentional.  Hervey Ard, M.D., F.A.C.S. [facility] 09/08/2017, 8:47 PM

## 2017-09-09 LAB — PHOSPHORUS: Phosphorus: 3.4 mg/dL (ref 2.1–4.3)

## 2017-09-17 ENCOUNTER — Other Ambulatory Visit: Payer: Self-pay | Admitting: Family Medicine

## 2017-09-17 DIAGNOSIS — E539 Vitamin B deficiency, unspecified: Secondary | ICD-10-CM

## 2017-09-17 DIAGNOSIS — F411 Generalized anxiety disorder: Secondary | ICD-10-CM

## 2017-10-26 ENCOUNTER — Ambulatory Visit: Payer: Medicare Other | Admitting: Family Medicine

## 2017-11-10 ENCOUNTER — Ambulatory Visit: Payer: Medicare Other | Admitting: Family Medicine

## 2017-11-10 ENCOUNTER — Ambulatory Visit (INDEPENDENT_AMBULATORY_CARE_PROVIDER_SITE_OTHER): Payer: Medicare Other

## 2017-11-10 ENCOUNTER — Encounter: Payer: Self-pay | Admitting: Family Medicine

## 2017-11-10 VITALS — BP 144/70 | HR 57 | Temp 98.1°F | Resp 12 | Ht <= 58 in | Wt 127.1 lb

## 2017-11-10 DIAGNOSIS — E785 Hyperlipidemia, unspecified: Secondary | ICD-10-CM

## 2017-11-10 DIAGNOSIS — R739 Hyperglycemia, unspecified: Secondary | ICD-10-CM

## 2017-11-10 DIAGNOSIS — E538 Deficiency of other specified B group vitamins: Secondary | ICD-10-CM

## 2017-11-10 DIAGNOSIS — I10 Essential (primary) hypertension: Secondary | ICD-10-CM

## 2017-11-10 DIAGNOSIS — Z1211 Encounter for screening for malignant neoplasm of colon: Secondary | ICD-10-CM

## 2017-11-10 DIAGNOSIS — F411 Generalized anxiety disorder: Secondary | ICD-10-CM

## 2017-11-10 DIAGNOSIS — Z0000 Encounter for general adult medical examination without abnormal findings: Secondary | ICD-10-CM

## 2017-11-10 DIAGNOSIS — E539 Vitamin B deficiency, unspecified: Secondary | ICD-10-CM

## 2017-11-10 DIAGNOSIS — M159 Polyosteoarthritis, unspecified: Secondary | ICD-10-CM

## 2017-11-10 DIAGNOSIS — M109 Gout, unspecified: Secondary | ICD-10-CM

## 2017-11-10 DIAGNOSIS — M9080 Osteopathy in diseases classified elsewhere, unspecified site: Secondary | ICD-10-CM

## 2017-11-10 DIAGNOSIS — E781 Pure hyperglyceridemia: Secondary | ICD-10-CM

## 2017-11-10 DIAGNOSIS — M150 Primary generalized (osteo)arthritis: Secondary | ICD-10-CM

## 2017-11-10 MED ORDER — LOSARTAN POTASSIUM 100 MG PO TABS: 100.0000 mg | ORAL_TABLET | Freq: Every day | ORAL | 1 refills | Status: DC

## 2017-11-10 MED ORDER — DULOXETINE HCL 60 MG PO CPEP: 60.0000 mg | ORAL_CAPSULE | Freq: Every day | ORAL | 1 refills | Status: DC

## 2017-11-10 MED ORDER — B-12 1000 MCG SL SUBL: 1.0000 | SUBLINGUAL_TABLET | SUBLINGUAL | 2 refills | Status: DC

## 2017-11-10 MED ORDER — ALPRAZOLAM 0.5 MG PO TABS: 0.2500 mg | ORAL_TABLET | Freq: Every evening | ORAL | 1 refills | Status: DC | PRN

## 2017-11-10 MED ORDER — NEBIVOLOL HCL 10 MG PO TABS: 10.0000 mg | ORAL_TABLET | Freq: Every day | ORAL | 3 refills | Status: DC

## 2017-11-10 NOTE — Patient Instructions (Addendum)
Ms. Felicia Robinson , Thank you for taking time to come for your Medicare Wellness Visit. I appreciate your ongoing commitment to your health goals. Please review the following plan we discussed and let me know if I can assist you in the future.   Screening recommendations/referrals: Colorectal Screening: Ordered Cologuard today. You will receive your package in the mail for completion of the exam Mammogram: Up to date Bone Density: Declined  Vision and Dental Exams: Recommended annual ophthalmology exams for early detection of glaucoma and other disorders of the eye Recommended annual dental exams for proper oral hygiene  Vaccinations: Influenza vaccine: Up to date Pneumococcal vaccine: Up to date Tdap vaccine: Up to date Shingles vaccine: Please call your insurance company to determine your out of pocket expense for the Shingrix vaccine. You may also receive this vaccine at your local pharmacy or Health Dept.    Advanced directives: Please bring a copy of your POA (Power of Attorney) and/or Living Will to your next appointment.  Goals: Recommend to drink at least 6-8 8oz glasses of water per day.  Next appointment: Please schedule your Annual Wellness Visit with your Nurse Health Advisor in one year.  Preventive Care 71 Years and Older, Female Preventive care refers to lifestyle choices and visits with your health care provider that can promote health and wellness. What does preventive care include?  A yearly physical exam. This is also called an annual well check.  Dental exams once or twice a year.  Routine eye exams. Ask your health care provider how often you should have your eyes checked.  Personal lifestyle choices, including:  Daily care of your teeth and gums.  Regular physical activity.  Eating a healthy diet.  Avoiding tobacco and drug use.  Limiting alcohol use.  Practicing safe sex.  Taking low-dose aspirin every day.  Taking vitamin and mineral supplements as  recommended by your health care provider. What happens during an annual well check? The services and screenings done by your health care provider during your annual well check will depend on your age, overall health, lifestyle risk factors, and family history of disease. Counseling  Your health care provider may ask you questions about your:  Alcohol use.  Tobacco use.  Drug use.  Emotional well-being.  Home and relationship well-being.  Sexual activity.  Eating habits.  History of falls.  Memory and ability to understand (cognition).  Work and work Statistician.  Reproductive health. Screening  You may have the following tests or measurements:  Height, weight, and BMI.  Blood pressure.  Lipid and cholesterol levels. These may be checked every 5 years, or more frequently if you are over 71 years old.  Skin check.  Lung cancer screening. You may have this screening every year starting at age 71 if you have a 30-pack-year history of smoking and currently smoke or have quit within the past 15 years.  Fecal occult blood test (FOBT) of the stool. You may have this test every year starting at age 71.  Flexible sigmoidoscopy or colonoscopy. You may have a sigmoidoscopy every 5 years or a colonoscopy every 10 years starting at age 71.  Hepatitis C blood test.  Hepatitis B blood test.  Sexually transmitted disease (STD) testing.  Diabetes screening. This is done by checking your blood sugar (glucose) after you have not eaten for a while (fasting). You may have this done every 1-3 years.  Bone density scan. This is done to screen for osteoporosis. You may have this done starting  at age 71.  Mammogram. This may be done every 1-2 years. Talk to your health care provider about how often you should have regular mammograms. Talk with your health care provider about your test results, treatment options, and if necessary, the need for more tests. Vaccines  Your health care  provider may recommend certain vaccines, such as:  Influenza vaccine. This is recommended every year.  Tetanus, diphtheria, and acellular pertussis (Tdap, Td) vaccine. You may need a Td booster every 10 years.  Zoster vaccine. You may need this after age 50.  Pneumococcal 13-valent conjugate (PCV13) vaccine. One dose is recommended after age 71.  Pneumococcal polysaccharide (PPSV23) vaccine. One dose is recommended after age 71. Talk to your health care provider about which screenings and vaccines you need and how often you need them. This information is not intended to replace advice given to you by your health care provider. Make sure you discuss any questions you have with your health care provider. Document Released: 05/04/2015 Document Revised: 12/26/2015 Document Reviewed: 02/06/2015 Elsevier Interactive Patient Education  2017 [place] Prevention in the Home Falls can cause injuries. They can happen to people of all ages. There are many things you can do to make your home safe and to help prevent falls. What can I do on the outside of my home?  Regularly fix the edges of walkways and driveways and fix any cracks.  Remove anything that might make you trip as you walk through a door, such as a raised step or threshold.  Trim any bushes or trees on the path to your home.  Use bright outdoor lighting.  Clear any walking paths of anything that might make someone trip, such as rocks or tools.  Regularly check to see if handrails are loose or broken. Make sure that both sides of any steps have handrails.  Any raised decks and porches should have guardrails on the edges.  Have any leaves, snow, or ice cleared regularly.  Use sand or salt on walking paths during winter.  Clean up any spills in your garage right away. This includes oil or grease spills. What can I do in the bathroom?  Use night lights.  Install grab bars by the toilet and in the tub and shower. Do  not use towel bars as grab bars.  Use non-skid mats or decals in the tub or shower.  If you need to sit down in the shower, use a plastic, non-slip stool.  Keep the floor dry. Clean up any water that spills on the floor as soon as it happens.  Remove soap buildup in the tub or shower regularly.  Attach bath mats securely with double-sided non-slip rug tape.  Do not have throw rugs and other things on the floor that can make you trip. What can I do in the bedroom?  Use night lights.  Make sure that you have a light by your bed that is easy to reach.  Do not use any sheets or blankets that are too big for your bed. They should not hang down onto the floor.  Have a firm chair that has side arms. You can use this for support while you get dressed.  Do not have throw rugs and other things on the floor that can make you trip. What can I do in the kitchen?  Clean up any spills right away.  Avoid walking on wet floors.  Keep items that you use a lot in easy-to-reach places.  If  you need to reach something above you, use a strong step stool that has a grab bar.  Keep electrical cords out of the way.  Do not use floor polish or wax that makes floors slippery. If you must use wax, use non-skid floor wax.  Do not have throw rugs and other things on the floor that can make you trip. What can I do with my stairs?  Do not leave any items on the stairs.  Make sure that there are handrails on both sides of the stairs and use them. Fix handrails that are broken or loose. Make sure that handrails are as long as the stairways.  Check any carpeting to make sure that it is firmly attached to the stairs. Fix any carpet that is loose or worn.  Avoid having throw rugs at the top or bottom of the stairs. If you do have throw rugs, attach them to the floor with carpet tape.  Make sure that you have a light switch at the top of the stairs and the bottom of the stairs. If you do not have them,  ask someone to add them for you. What else can I do to help prevent falls?  Wear shoes that:  Do not have high heels.  Have rubber bottoms.  Are comfortable and fit you well.  Are closed at the toe. Do not wear sandals.  If you use a stepladder:  Make sure that it is fully opened. Do not climb a closed stepladder.  Make sure that both sides of the stepladder are locked into place.  Ask someone to hold it for you, if possible.  Clearly mark and make sure that you can see:  Any grab bars or handrails.  First and last steps.  Where the edge of each step is.  Use tools that help you move around (mobility aids) if they are needed. These include:  Canes.  Walkers.  Scooters.  Crutches.  Turn on the lights when you go into a dark area. Replace any light bulbs as soon as they burn out.  Set up your furniture so you have a clear path. Avoid moving your furniture around.  If any of your floors are uneven, fix them.  If there are any pets around you, be aware of where they are.  Review your medicines with your doctor. Some medicines can make you feel dizzy. This can increase your chance of falling. Ask your doctor what other things that you can do to help prevent falls. This information is not intended to replace advice given to you by your health care provider. Make sure you discuss any questions you have with your health care provider. Document Released: 02/01/2009 Document Revised: 09/13/2015 Document Reviewed: 05/12/2014 Elsevier Interactive Patient Education  2017 [employer].

## 2017-11-10 NOTE — Progress Notes (Signed)
Name: Felicia Robinson   MRN: [id number]    DOB: [birth year]   Date:11/10/2017       Progress Note  Subjective  Chief Complaint  Chief Complaint  Patient presents with  . Follow-up    HPI  HTN: she has hypertension, bp at home was in the 120's range, but stress has been higher with her fracture and also husband has been having visual problems. 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 She states recently had some 150's range bp. .   Gait instability: recurrent fall and during a Fall October 2018, pain got worse seen by Ortho found to she had distal femur stress fracture, had to be non weight bearing for months, walking with walker now. Pain is mild now  X-linked hypophosphatemia: she is seeing Dr. Delane Ginger, and is trying a medication as a trial. Getting injections at home.  She is doing well with medication  GAD: taking alprazolam, she worries a lot, able to sleep and function with alprazolam and denies side effects. Discussed mindful exercises. Started on Duloxetine Nov 2018, she is only taking 30 mg, she is under more stress and has been taking more alprazolam lately, we will adjust dose of Duloxetine to 60 mg daily   Gout: She has not been taking colcrys prn, she had to stop Allopurinol because it caused diarrhea. Unchanged   Breast Cancer left side: dx in 2013, she finished Tamoxifen,completed5 years Spring 2018. She still follows up with Dr. Fleet Contras.Last mammogram 08/2017  Burning feet : she has intermittent burning sensation on both of her feet, not daily, discussed options, gabapentin, lyrica and duloxetine. She has been using aspar cream with lidocaine and also taking duloxetine and has helped some with symptoms.   History of stress fracture of femurs and right hip, seen by Dr. Rudene Christians had to use a walker , healing finally occurred . Using a cane prn , she has it today because of rain.   Patient Active Problem List   Diagnosis Date Noted  . Stress fracture of  right femur with delayed healing 04/27/2017  . Osteoarthritis, knee 07/26/2015  . Other bilateral secondary osteoarthritis of hip 07/26/2015  . Hypertension, benign 01/29/2015  . Controlled gout 01/29/2015  . X-linked hypophosphatemic rickets 01/29/2015  . GAD (generalized anxiety disorder) 01/29/2015  . Gait instability 01/29/2015  . Recurrent falls 01/29/2015  . Acquired scoliosis 01/29/2015  . History of breast cancer in female 09/08/2012  . H/O rickets 09/08/2012  . History of shingles 12/06/2011    Past Surgical History:  Procedure Laterality Date  . BREAST SURGERY Left 09/30/2011   Left simple mastectomy with sentinel node biopsy  . CATARACT EXTRACTION, BILATERAL Bilateral 04/2015   Dr. Tommy Rainwater in [place]  . COLONOSCOPY  2012  . DILATION AND CURETTAGE OF UTERUS  1982  . MASTECTOMY Left 2013   with SN biopsy  . TONSILLECTOMY  1953  . UPPER GI ENDOSCOPY  2012    Family History  Problem Relation Age of Onset  . Lymphoma Brother 64       Hodgkin's Lymphoma  . Cancer Maternal Uncle 70       cancer of the esophagus  . Alzheimer's disease Mother   . Stroke Father   . Breast cancer Neg Hx     Social History   Socioeconomic History  . Marital status: Married    Spouse name: Sonny  . Number of children: 1  . Years of education: some college  . Highest education level:  12th grade  Occupational History  . Occupation: Retired  Scientific laboratory technician  . Financial resource strain: Not hard at all  . Food insecurity:    Worry: Never true    Inability: Never true  . Transportation needs:    Medical: No    Non-medical: No  Tobacco Use  . Smoking status: Former Smoker    Packs/day: 0.50    Years: 30.00    Pack years: 15.00    Types: Cigarettes    Last attempt to quit: 04/21/2006    Years since quitting: 11.5  . Smokeless tobacco: Never Used  Substance and Sexual Activity  . Alcohol use: Yes    Alcohol/week: 0.6 oz    Types: 1 Glasses of wine per week  . Drug use: No  .  Sexual activity: Not Currently  Lifestyle  . Physical activity:    Days per week: 0 days    Minutes per session: 0 min  . Stress: Not at all  Relationships  . Social connections:    Talks on phone: Patient refused    Gets together: Patient refused    Attends religious service: Patient refused    Active member of club or organization: Patient refused    Attends meetings of clubs or organizations: Patient refused    Relationship status: Married  . Intimate partner violence:    Fear of current or ex partner: No    Emotionally abused: No    Physically abused: No    Forced sexual activity: No  Other Topics Concern  . Not on file  Social History Narrative  . Not on file     Current Outpatient Medications:  .  ALPRAZolam (XANAX) 0.5 MG tablet, Take 0.5-1 tablets (0.25-0.5 mg total) by mouth at bedtime as needed. for anxiety, Disp: 30 tablet, Rfl: 1 .  aspirin 81 MG chewable tablet, Chew 81 mg by mouth daily., Disp: , Rfl:  .  Burosumab-twza (CRYSVITA) 30 MG/ML SOLN, Inject 60 mg into the skin every 30 (thirty) days., Disp: 2 mL, Rfl: 0 .  colchicine 0.6 MG tablet, Take 2 tablets (1.2 mg total) by mouth daily as needed. Max 1.8mg Bailey Mech, Disp: 30 tablet, Rfl: 0 .  DULoxetine (CYMBALTA) 30 MG capsule, TAKE 1 CAPSULE(30 MG) BY MOUTH DAILY, Disp: 90 capsule, Rfl: 0 .  losartan (COZAAR) 100 MG tablet, Take 1 tablet (100 mg total) daily by mouth., Disp: 90 tablet, Rfl: 1 .  nebivolol (BYSTOLIC) 10 MG tablet, Take 0.5-1 tablets (5-10 mg total) by mouth daily., Disp: 30 tablet, Rfl: 1 .  omega-3 acid ethyl esters (LOVAZA) 1 g capsule, Take 2 capsules (2 g total) 2 (two) times daily by mouth., Disp: 120 capsule, Rfl: 5  Allergies  Allergen Reactions  . Allopurinol     diarrhea  . Codeine Nausea And Vomiting  . Other     Pain medications Altered mental status  . Shellfish Allergy Diarrhea and Nausea And Vomiting    Just when eating clams Just when eating clams Just when eating clams       ROS  Constitutional: Negative for fever or weight change.  Respiratory: Negative for cough and shortness of breath.   Cardiovascular: Negative for chest pain or palpitations.  Gastrointestinal: Negative for abdominal pain, no bowel changes.  Musculoskeletal: Positive for gait problem or joint swelling.  Skin: Negative for rash.  Neurological: Negative for dizziness or headache.  No other specific complaints in a complete review of systems (except as listed in HPI above).  Objective  Vitals:   11/10/17 1350  BP: (!) 144/70  Pulse: (!) 57  Resp: 12  Temp: 98.1 F (36.7 C)  TempSrc: Oral  SpO2: 94%  Weight: 127 lb 1.6 oz (57.7 kg)  Height: 4\' 7"  (1.397 m)    Body mass index is 29.54 kg/m.  Physical Exam  Constitutional: Patient appears well-developed and well-nourished. Overweight. No distress.  HEENT: head atraumatic, normocephalic, pupils equal and reactive to light,  neck supple, throat within normal limits Cardiovascular: Normal rate, regular rhythm and normal heart sounds.  No murmur heard. No BLE edema. Pulmonary/Chest: Effort normal and breath sounds normal. No respiratory distress. Abdominal: Soft.  There is no tenderness. Psychiatric: Patient has a normal mood and affect. behavior is normal. Judgment and thought content normal. Muscular Skeletal: she has slow gait, using cane  Recent Results (from the past 2160 hour(s))  Phosphorus     Status: None   Collection Time: 09/08/17  2:02 PM  Result Value Ref Range   Phosphorus 3.4 2.1 - 4.3 mg/dL     PHQ2/9: Depression screen [facility] 2/9 11/10/2017 10/15/2016 06/03/2016 01/30/2016 07/26/2015  Decreased Interest 0 0 0 0 0  Down, Depressed, Hopeless 0 0 0 0 0  PHQ - 2 Score 0 0 0 0 0  Altered sleeping 0 - - - -  Tired, decreased energy 0 - - - -  Change in appetite 0 - - - -  Feeling bad or failure about yourself  0 - - - -  Trouble concentrating 0 - - - -  Moving slowly or fidgety/restless 0 - - - -  Suicidal  thoughts 0 - - - -  PHQ-9 Score 0 - - - -  Difficult doing work/chores Not difficult at all - - - -     Fall Risk: Fall Risk  11/10/2017 06/23/2017 10/15/2016 06/03/2016 01/30/2016  Falls in the past year? Yes No No No No  Number falls in past yr: 1 - - - -  Injury with Fall? Yes - - - -  Risk Factor Category  High Fall Risk - - - -  Risk for fall due to : Impaired vision;History of fall(s);Impaired balance/gait - - - -  Risk for fall due to: Comment wears eyeglasses; ambulates with cane - - - -  Follow up Falls evaluation completed;Education provided;Falls prevention discussed - - - -      Assessment & Plan  1. Hypertension, benign  - nebivolol (BYSTOLIC) 10 MG tablet; Take 1 tablet (10 mg total) by mouth daily.  Dispense: 30 tablet; Refill: 3 - losartan (COZAAR) 100 MG tablet; Take 1 tablet (100 mg total) by mouth daily.  Dispense: 90 tablet; Refill: 1  2. Disorder of phosphorus metabolism  Keep follow up with Dr. Reuel Derby doing well with injections  3. X-linked hypophosphatemic rickets   4. Dyslipidemia   5. Controlled gout   6. GAD (generalized anxiety disorder)  - ALPRAZolam (XANAX) 0.5 MG tablet; Take 0.5-1 tablets (0.25-0.5 mg total) by mouth at bedtime as needed. for anxiety  Dispense: 30 tablet; Refill: 1 - DULoxetine (CYMBALTA) 60 MG capsule; Take 1 capsule (60 mg total) by mouth daily.  Dispense: 90 capsule; Refill: 1  7. Primary osteoarthritis involving multiple joints   8. B12 deficiency  Advised to take sublingual B12 otc   9. Hyperglycemia  We will recheck labs in Nov  10. Hypertriglyceridemia  Taking Lovaza but only one BID and wants to recheck labs next visit   11. Burning feet syndrome  -  DULoxetine (CYMBALTA) 60 MG capsule; Take 1 capsule (60 mg total) by mouth daily.  Dispense: 90 capsule; Refill: 1

## 2017-11-10 NOTE — Progress Notes (Addendum)
Subjective:   Felicia Robinson is a 72 y.o. female who presents for Medicare Annual (Subsequent) preventive examination.  Review of Systems:  N/A Cardiac Risk Factors include: advanced age (>34mn, >>68women);dyslipidemia;hypertension;sedentary lifestyle     Objective:     Vitals: BP (!) 144/70 (BP Location: Right Arm, Patient Position: Sitting, Cuff Size: Normal)   Pulse (!) 57   Temp 98.1 F (36.7 C) (Oral)   Resp 12   Ht 4' 7" (1.397 m)   Wt 127 lb 1.6 oz (57.7 kg)   SpO2 94%   BMI 29.54 kg/m   Body mass index is 29.54 kg/m.  Advanced Directives 11/10/2017 10/15/2016 06/03/2016 01/30/2016 07/26/2015 01/29/2015 08/28/2014  Does Patient Have a Medical Advance Directive? _0  No Yes  Type of AParamedicof AArcherLiving will HNorth WebsterLiving will HJuncalLiving will HOcean GroveLiving will HR[place]nhamLiving will - Living will  Does patient want to make changes to medical advance directive? - - - No - Patient declined No - Patient declined - -  Copy of [place] Chart? No - copy requested Yes - Yes No - copy requested - -  Would patient like information on creating a medical advance directive? - - - - - No - patient declined information -    Tobacco Social History   Tobacco Use  Smoking Status Former Smoker  . Packs/day: 0.50  . Years: 30.00  . Pack years: 15.00  . Types: Cigarettes  . Last attempt to quit: 04/21/2006  . Years since quitting: 11.5  Smokeless Tobacco Never Used     Counseling given: Not Answered  Clinical Intake:  Pre-visit preparation completed: Yes  Pain : No/denies pain   BMI - recorded: 29.05 Nutritional Status: BMI 25 -29 Overweight Nutritional Risks: None Diabetes: No  How often do you need to have someone help you when you read instructions, pamphlets, or other written materials from your doctor or pharmacy?:  1 - Never  Interpreter Needed?: No  Information entered by :: AEversole, LPN  Past Medical History:  Diagnosis Date  . Breast cancer ([place] 2013   left breast  . Cancer ([place] 2013   L breast  . Disorders of phosphorus metabolism    rickets  . Fibroadenosis of breast   . Gout 2013  . Hypertension 2009  . Malignant neoplasm of upper-outer quadrant of female breast ([place] 09/30/2011   7 mm low-grade invasive mammary carcinoma with focal DCIS. ER 90%, PR 1%, HER-2/neu not overexpressing.  . Osteomalacia   . Shingles 2013   Past Surgical History:  Procedure Laterality Date  . BREAST SURGERY Left 09/30/2011   Left simple mastectomy with sentinel node biopsy  . CATARACT EXTRACTION, BILATERAL Bilateral 04/2015   [place] . COLONOSCOPY  2012  . DILATION AND CURETTAGE OF UTERUS  1982  . MASTECTOMY Left 2013   with SN biopsy  . TONSILLECTOMY  1953  . UPPER GI ENDOSCOPY  2012   Family History  Problem Relation Age of Onset  . Lymphoma Brother 46      Hodgkin's Lymphoma  . Cancer Maternal Uncle 70       cancer of the esophagus  . Alzheimer's disease Mother   . Stroke Father   . Breast cancer Neg Hx    Social History   Socioeconomic History  . Marital status: Married    Spouse name: Sonny  .  Number of children: 1  . Years of education: some college  . Highest education level: 12th grade  Occupational History  . Occupation: Retired  Scientific laboratory technician  . Financial resource strain: Not hard at all  . Food insecurity:    Worry: Never true    Inability: Never true  . Transportation needs:    Medical: No    Non-medical: No  Tobacco Use  . Smoking status: Former Smoker    Packs/day: 0.50    Years: 30.00    Pack years: 15.00    Types: Cigarettes    Last attempt to quit: 04/21/2006    Years since quitting: 11.5  . Smokeless tobacco: Never Used  Substance and Sexual Activity  . Alcohol use: Yes    Alcohol/week: 0.6 oz    Types: 1 Glasses of wine per week  .  Drug use: No  . Sexual activity: Not Currently  Lifestyle  . Physical activity:    Days per week: 0 days    Minutes per session: 0 min  . Stress: Not at all  Relationships  . Social connections:    Talks on phone: Patient refused    Gets together: Patient refused    Attends religious service: Patient refused    Active member of club or organization: Patient refused    Attends meetings of clubs or organizations: Patient refused    Relationship status: Married  Other Topics Concern  . Not on file  Social History Narrative  . Not on file    Outpatient Encounter Medications as of 11/10/2017  Medication Sig  . ALPRAZolam (XANAX) 0.5 MG tablet Take 0.5-1 tablets (0.25-0.5 mg total) by mouth at bedtime as needed. for anxiety  . aspirin 81 MG chewable tablet Chew 81 mg by mouth daily.  . Burosumab-twza (CRYSVITA) 30 MG/ML SOLN Inject 60 mg into the skin every 30 (thirty) days.  . colchicine 0.6 MG tablet Take 2 tablets (1.2 mg total) by mouth daily as needed. Max 1.51m/24hrs  . DULoxetine (CYMBALTA) 30 MG capsule TAKE 1 CAPSULE(30 MG) BY MOUTH DAILY  . losartan (COZAAR) 100 MG tablet Take 1 tablet (100 mg total) daily by mouth.  . nebivolol (BYSTOLIC) 10 MG tablet Take 0.5-1 tablets (5-10 mg total) by mouth daily.  .Marland Kitchenomega-3 acid ethyl esters (LOVAZA) 1 g capsule Take 2 capsules (2 g total) 2 (two) times daily by mouth.   No facility-administered encounter medications on file as of 11/10/2017.     Activities of Daily Living In your present state of health, do you have any difficulty performing the following activities: 11/10/2017 06/23/2017  Hearing? N N  Comment denies hearing aids -  Vision? N N  Comment wears eyeglasses -  Difficulty concentrating or making decisions? N N  Walking or climbing stairs? N N  Dressing or bathing? N N  Doing errands, shopping? N N  Preparing Food and eating ? N -  Comment partial lower dentures -  Using the Toilet? N -  In the past six months, have  you accidently leaked urine? N -  Do you have problems with loss of bowel control? N -  Managing your Medications? N -  Managing your Finances? N -  Housekeeping or managing your Housekeeping? N -  Some recent data might be hidden    Patient Care Team: SSteele Sizer MD as PCP - General (Family Medicine) BBary Castilla JForest Gleason MD (General Surgery) RLona Millard MD as Consulting Physician (Endocrinology)    Assessment:   This  is a routine wellness examination for [place].  Exercise Activities and Dietary recommendations Current Exercise Habits: The patient does not participate in regular exercise at present, Exercise limited by: None identified  Goals    . DIET - INCREASE WATER INTAKE     Recommend to drink at least 6-8 8oz glasses of water per day.       Fall Risk Fall Risk  11/10/2017 06/23/2017 10/15/2016 06/03/2016 01/30/2016  Falls in the past year? Yes No No No No  Number falls in past yr: 1 - - - -  Injury with Fall? Yes - - - -  Risk Factor Category  High Fall Risk - - - -  Risk for fall due to : Impaired vision;History of fall(s);Impaired balance/gait - - - -  Risk for fall due to: Comment wears eyeglasses; ambulates with cane - - - -  Follow up Falls evaluation completed;Education provided;Falls prevention discussed - - - -   FALL RISK PREVENTION PERTAINING TO HOME: Is your home free of loose throw rugs in walkways, pet beds, electrical cords, etc? Yes Is there adequate lighting in your home to reduce risk of falls?  Yes Are there stairs in or around your home WITH handrails? Yes  ASSISTIVE DEVICES UTILIZED TO PREVENT FALLS: Use of a cane, walker or w/c? Yes, cane Grab bars in the bathroom? No  Shower chair or a place to sit while bathing? Yes An elevated toilet seat or a handicapped toilet? No  Timed Get Up and Go Performed: Yes. Pt ambulated 10 feet within 29 sec. Gait slow, steady and with the use of an assistive device. No intervention required at this time.  Fall risk prevention has been discussed.  Community Resource Referral:  Pt declined my offer to send [employer] Referral to Care Guide for installation of grab bars in the shower, shower chair or an elevated toilet seat.  Depression Screen PHQ 2/9 Scores 11/10/2017 10/15/2016 06/03/2016 01/30/2016  PHQ - 2 Score 0 0 0 0  PHQ- 9 Score 0 - - -     Cognitive Function     6CIT Screen 11/10/2017  What Year? 0 points  What month? 0 points  What time? 0 points  Count back from 20 0 points  Months in reverse 0 points  Repeat phrase 0 points  Total Score 0    Immunization History  Administered Date(s) Administered  . Influenza, High Dose Seasonal PF 01/29/2015, 01/30/2016, 02/23/2017  . Influenza-Unspecified 01/12/2014  . Pneumococcal Conjugate-13 01/29/2015  . Pneumococcal Polysaccharide-23 05/01/2011, 06/03/2016  . Tdap 09/20/2009    Qualifies for Shingles Vaccine? Yes. Due for Shingrix. Education has been provided regarding the importance of this vaccine. Pt has been advised to call insurance company to determine out of pocket expense. Advised may also receive vaccine at local pharmacy or Health Dept. Verbalized acceptance and understanding.  Screening Tests Health Maintenance  Topic Date Due  . Fecal DNA (Cologuard)  08/20/1996  . INFLUENZA VACCINE  11/19/2017  . MAMMOGRAM  09/03/2018  . TETANUS/TDAP  09/21/2019  . DEXA SCAN  Completed  . Hepatitis C Screening  Completed  . PNA vac Low Risk Adult  Completed    Cancer Screenings: Lung: Low Dose CT Chest recommended if Age 39-80 years, 30 pack-year currently smoking OR have quit w/in 15years. Patient does not qualify. Breast:  Up to date on Mammogram? Yes. Completed 09/02/17. Repeat every year.   Up to date of Bone Density/Dexa? No. Completed 08/17/12. Results reflect osteoporosis. Repeat every  2 years. Declined my offer to order screening today. Education provided re: importance of this screening but still declined.  Requesting to speak with Dr. Truddie Coco. Colorectal: Completed 07/18/10. Repeat every 5 years. Results were normal. Denies polyps or family h/o colon cancer. Declined to be referred to GI for colonoscopy. Requesting Cologuard. Ordered today. Aware she will receive package in the mail for completion of the exam.  Additional Screenings: Hepatitis C Screening: Completed 09/14/12    Plan:  I have personally reviewed and addressed the Medicare Annual Wellness questionnaire and have noted the following in the patient's chart:  A. Medical and social history B. Use of alcohol, tobacco or illicit drugs  C. Current medications and supplements D. Functional ability and status E.  Nutritional status F.  Physical activity G. Advance directives H. List of other physicians I.  Hospitalizations, surgeries, and ER visits in previous 12 months J.  [place] such as hearing and vision if needed, cognitive and depression L. Referrals and appointments  In addition, I have reviewed and discussed with patient certain preventive protocols, quality metrics, and best practice recommendations. A written personalized care plan for preventive services as well as general preventive health recommendations were provided to patient.  See attached scanned questionnaire for additional information.   I have reviewed this encounter including the documentation in this note and/or discussed this patient with the provider, Aleatha Borer, LPN. I am certifying that I agree with the content of this note as supervising physician.  Steele Sizer, MD [facility] 11/10/2017, 9:54 PM Signed,  Aleatha Borer, LPN Nurse Health Advisor

## 2018-03-02 ENCOUNTER — Other Ambulatory Visit: Payer: Self-pay | Admitting: Family Medicine

## 2018-03-02 DIAGNOSIS — I10 Essential (primary) hypertension: Secondary | ICD-10-CM

## 2018-03-02 DIAGNOSIS — F411 Generalized anxiety disorder: Secondary | ICD-10-CM

## 2018-03-02 DIAGNOSIS — E539 Vitamin B deficiency, unspecified: Secondary | ICD-10-CM

## 2018-03-02 MED ORDER — NEBIVOLOL HCL 10 MG PO TABS: 10.0000 mg | ORAL_TABLET | Freq: Every day | ORAL | 3 refills | Status: DC

## 2018-03-02 MED ORDER — LOSARTAN POTASSIUM 100 MG PO TABS: 100.0000 mg | ORAL_TABLET | Freq: Every day | ORAL | 1 refills | Status: DC

## 2018-03-02 MED ORDER — DULOXETINE HCL 60 MG PO CPEP: 60.0000 mg | ORAL_CAPSULE | Freq: Every day | ORAL | 1 refills | Status: DC

## 2018-03-02 NOTE — Telephone Encounter (Signed)
Copied from [place] [id number]. Topic: Quick Communication - Rx Refill/Question >> Mar 02, 2018  2:30 PM Keene Breath wrote: Medication: DULoxetine (CYMBALTA) 60 MG capsule / losartan (COZAAR) 100 MG tablet / nebivolol (BYSTOLIC) 10 MG tablet / Fish Oil  Patient called to request an early refill on the above medications until her appointment on 03/14/18.  Patient is out of town unexpectedly and will not be back until her appointment and will be out of medication on tomorrow, 03/03/18.  Patient stated that she would like the refills sent to the pharmacy below for these refills.  CB# [phone]  Preferred Pharmacy (with phone number or street name): [facility] [place], [place] [address] [phone] (Phone) [phone] (Fax)

## 2018-03-02 NOTE — Telephone Encounter (Signed)
DULoxetine (CYMBALTA) 60 MG capsule   losartan (COZAAR) 100 MG tablet  nebivolol (BYSTOLIC) 10 MG tablet   Fish Oil  Patient called to request an early refill on the above medications until her appointment on 03/14/18.  Patient is out of town unexpectedly and will not be back until her appointment and will be out of medication on tomorrow, 03/03/18.  Patient stated that she would like the refills sent to: CB# [phone]  Preferred Pharmacy (with phone number or street name): [facility] [place], [place] [address]     [phone] (Phone) [phone] (Fax)

## 2018-03-16 ENCOUNTER — Encounter: Payer: Self-pay | Admitting: Family Medicine

## 2018-03-16 ENCOUNTER — Other Ambulatory Visit: Payer: Self-pay | Admitting: Family Medicine

## 2018-03-16 ENCOUNTER — Ambulatory Visit: Payer: Medicare Other | Admitting: Family Medicine

## 2018-03-16 VITALS — BP 180/100 | HR 73 | Temp 98.0°F | Resp 16 | Ht <= 58 in | Wt 127.1 lb

## 2018-03-16 DIAGNOSIS — Z23 Encounter for immunization: Secondary | ICD-10-CM

## 2018-03-16 DIAGNOSIS — E785 Hyperlipidemia, unspecified: Secondary | ICD-10-CM | POA: Diagnosis not present

## 2018-03-16 DIAGNOSIS — I10 Essential (primary) hypertension: Secondary | ICD-10-CM | POA: Diagnosis not present

## 2018-03-16 DIAGNOSIS — M109 Gout, unspecified: Secondary | ICD-10-CM

## 2018-03-16 DIAGNOSIS — R202 Paresthesia of skin: Secondary | ICD-10-CM

## 2018-03-16 DIAGNOSIS — R739 Hyperglycemia, unspecified: Secondary | ICD-10-CM

## 2018-03-16 DIAGNOSIS — E538 Deficiency of other specified B group vitamins: Secondary | ICD-10-CM

## 2018-03-16 DIAGNOSIS — E790 Hyperuricemia without signs of inflammatory arthritis and tophaceous disease: Secondary | ICD-10-CM

## 2018-03-16 DIAGNOSIS — R2681 Unsteadiness on feet: Secondary | ICD-10-CM

## 2018-03-16 DIAGNOSIS — M9080 Osteopathy in diseases classified elsewhere, unspecified site: Secondary | ICD-10-CM

## 2018-03-16 DIAGNOSIS — Z9989 Dependence on other enabling machines and devices: Secondary | ICD-10-CM

## 2018-03-16 DIAGNOSIS — E781 Pure hyperglyceridemia: Secondary | ICD-10-CM

## 2018-03-16 DIAGNOSIS — F411 Generalized anxiety disorder: Secondary | ICD-10-CM

## 2018-03-16 DIAGNOSIS — E539 Vitamin B deficiency, unspecified: Secondary | ICD-10-CM

## 2018-03-16 MED ORDER — HYDRALAZINE HCL 10 MG PO TABS: 10.0000 mg | ORAL_TABLET | Freq: Three times a day (TID) | ORAL | 0 refills | Status: DC

## 2018-03-16 MED ORDER — OMEGA-3-ACID ETHYL ESTERS 1 G PO CAPS: 1.0000 g | ORAL_CAPSULE | Freq: Two times a day (BID) | ORAL | 1 refills | Status: DC

## 2018-03-16 MED ORDER — BUSPIRONE HCL 5 MG PO TABS: 5.0000 mg | ORAL_TABLET | Freq: Three times a day (TID) | ORAL | 0 refills | Status: DC | PRN

## 2018-03-16 MED ORDER — ALPRAZOLAM 0.5 MG PO TABS: 0.2500 mg | ORAL_TABLET | Freq: Every evening | ORAL | 0 refills | Status: DC | PRN

## 2018-03-16 NOTE — Progress Notes (Signed)
Name: Felicia Robinson   MRN: [id number]    DOB: August 10, 1945   Date:03/16/2018       Progress Note  Subjective  Chief Complaint  Chief Complaint  Patient presents with  . Hypertension  . Hyperglycemia  . Anxiety    HPI  HTN: she has hypertension, bp at home was in the 120's range, however over the past two weeks bp has been elevated during Cryvista injection, bp in the 180's/100's. 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 She is taking medication as prescribed, denies chest pain or palpitation. We will check labs   Gait instability: stable, she uses a cane.   X-linked hypophosphatemia: she is seeing Dr. Delane Ginger, and is trying injections of Cryvista initially pain was under control, but over the past few weeks it has been giving 50 mg instead of 60 mg and pain is back now. We will recheck labs and send to endo   GAD: taking alprazolam, she worries a lot, able to sleep and function with alprazolam and denies side effects. Discussed mindful exercises.Started on Duloxetine Nov 2018, she was on 30 mg and now she is on 60 mg but still feels anxious, we will add buspar   Gout: She has not been taking colcrys prn, she had to stop Allopurinol because it caused diarrhea.  Unchanged  Breast Cancer left side: dx in 2013, she finished Tamoxifen,completed5 years Spring 2018. She still follows up with Dr. Fleet Contras.Last mammogram 08/2017. Unchanged   Burning feet : she has intermittent burning sensation on both of her feet, not daily, discussed options, gabapentin, lyrica and duloxetine.She has been using aspar cream with lidocaine and also taking duloxetine and has helped some with symptoms.   History of stress fracture of femurs and right hip, seen by Dr. Rudene Christians had to use a walker , healing finally occurred . Using a cane prn , she states pain has not been controlled lately.    Patient Active Problem List   Diagnosis Date Noted   . Stress fracture of right femur with delayed healing 04/27/2017  . Osteoarthritis, knee 07/26/2015  . Other bilateral secondary osteoarthritis of hip 07/26/2015  . Hypertension, benign 01/29/2015  . Controlled gout 01/29/2015  . X-linked hypophosphatemic rickets 01/29/2015  . GAD (generalized anxiety disorder) 01/29/2015  . Gait instability 01/29/2015  . Recurrent falls 01/29/2015  . Acquired scoliosis 01/29/2015  . History of breast cancer in female 09/08/2012  . H/O rickets 09/08/2012  . History of shingles 12/06/2011    Past Surgical History:  Procedure Laterality Date  . BREAST SURGERY Left 09/30/2011   Left simple mastectomy with sentinel node biopsy  . CATARACT EXTRACTION, BILATERAL Bilateral 04/2015   Dr. Tommy Rainwater in [place]  . COLONOSCOPY  2012  . DILATION AND CURETTAGE OF UTERUS  1982  . MASTECTOMY Left 2013   with SN biopsy  . TONSILLECTOMY  1953  . UPPER GI ENDOSCOPY  2012    Family History  Problem Relation Age of Onset  . Lymphoma Brother 89       Hodgkin's Lymphoma  . Cancer Maternal Uncle 70       cancer of the esophagus  . Alzheimer's disease Mother   . Stroke Father   . Breast cancer Neg Hx     Social History   Socioeconomic History  . Marital status: Married    Spouse name: Sonny  . Number of children: 1  . Years of education: some college  .  Highest education level: 12th grade  Occupational History  . Occupation: Retired  Scientific laboratory technician  . Financial resource strain: Not hard at all  . Food insecurity:    Worry: Never true    Inability: Never true  . Transportation needs:    Medical: No    Non-medical: No  Tobacco Use  . Smoking status: Former Smoker    Packs/day: 0.50    Years: 30.00    Pack years: 15.00    Types: Cigarettes    Last attempt to quit: 04/21/2006    Years since quitting: 11.9  . Smokeless tobacco: Never Used  Substance and Sexual Activity  . Alcohol use: Yes    Alcohol/week: 1.0 standard drinks    Types: 1 Glasses  of wine per week  . Drug use: No  . Sexual activity: Not Currently  Lifestyle  . Physical activity:    Days per week: 0 days    Minutes per session: 0 min  . Stress: Not at all  Relationships  . Social connections:    Talks on phone: Patient refused    Gets together: Patient refused    Attends religious service: Patient refused    Active member of club or organization: Patient refused    Attends meetings of clubs or organizations: Patient refused    Relationship status: Married  . Intimate partner violence:    Fear of current or ex partner: No    Emotionally abused: No    Physically abused: No    Forced sexual activity: No  Other Topics Concern  . Not on file  Social History Narrative  . Not on file     Current Outpatient Medications:  .  ALPRAZolam (XANAX) 0.5 MG tablet, Take 0.5-1 tablets (0.25-0.5 mg total) by mouth at bedtime as needed. for anxiety, Disp: 30 tablet, Rfl: 1 .  aspirin 81 MG chewable tablet, Chew 81 mg by mouth daily., Disp: , Rfl:  .  Burosumab-twza (CRYSVITA) 30 MG/ML SOLN, Inject 60 mg into the skin every 30 (thirty) days., Disp: 2 mL, Rfl: 0 .  colchicine 0.6 MG tablet, Take 2 tablets (1.2 mg total) by mouth daily as needed. Max 1.8mg Bailey Mech, Disp: 30 tablet, Rfl: 0 .  Cyanocobalamin (B-12) 1000 MCG SUBL, Place 1 tablet under the tongue 3 (three) times a week., Disp: 30 each, Rfl: 2 .  DULoxetine (CYMBALTA) 60 MG capsule, Take 1 capsule (60 mg total) by mouth daily., Disp: 90 capsule, Rfl: 1 .  losartan (COZAAR) 100 MG tablet, Take 1 tablet (100 mg total) by mouth daily., Disp: 90 tablet, Rfl: 1 .  nebivolol (BYSTOLIC) 10 MG tablet, Take 1 tablet (10 mg total) by mouth daily., Disp: 30 tablet, Rfl: 3 .  omega-3 acid ethyl esters (LOVAZA) 1 g capsule, Take 2 capsules (2 g total) 2 (two) times daily by mouth., Disp: 120 capsule, Rfl: 5  Allergies  Allergen Reactions  . Allopurinol     diarrhea  . Codeine Nausea And Vomiting  . Other     Pain  medications Altered mental status  . Shellfish Allergy Diarrhea and Nausea And Vomiting    Just when eating clams Just when eating clams Just when eating clams     I personally reviewed active problem list, medication list, allergies, family history, social history with the patient/caregiver today.   ROS  Constitutional: Negative for fever or weight change.  Respiratory: Negative for cough and shortness of breath.   Cardiovascular: Negative for chest pain or palpitations.  Gastrointestinal: Negative  for abdominal pain, no bowel changes.  Musculoskeletal: Positive for gait problem or joint swelling.  Skin: Negative for rash.  Neurological: Negative for dizziness or headache.  No other specific complaints in a complete review of systems (except as listed in HPI above).  Objective  Vitals:   03/16/18 1319  BP: (!) 180/100  Pulse: 73  Resp: 16  Temp: 98 F (36.7 C)  TempSrc: Oral  SpO2: 97%  Weight: 127 lb 1.6 oz (57.7 kg)  Height: 4\' 7"  (1.397 m)    Body mass index is 29.54 kg/m.  Physical Exam  Constitutional: Patient appears well-developed and well-nourished. Overweight.  No distress.  HEENT: head atraumatic, normocephalic, pupils equal and reactive to light, neck supple, throat within normal limits Cardiovascular: Normal rate, regular rhythm and normal heart sounds.  No murmur heard. No BLE edema. Pulmonary/Chest: Effort normal and breath sounds normal. No respiratory distress. Abdominal: Soft.  There is no tenderness. Psychiatric: Patient has a normal mood and affect. behavior is normal. Judgment and thought content normal. Muscular Skeletal: using her cane today, antalgic gait  PHQ2/9: Depression screen [facility] 2/9 11/10/2017 10/15/2016 06/03/2016 01/30/2016 07/26/2015  Decreased Interest 0 0 0 0 0  Down, Depressed, Hopeless 0 0 0 0 0  PHQ - 2 Score 0 0 0 0 0  Altered sleeping 0 - - - -  Tired, decreased energy 0 - - - -  Change in appetite 0 - - - -  Feeling bad  or failure about yourself  0 - - - -  Trouble concentrating 0 - - - -  Moving slowly or fidgety/restless 0 - - - -  Suicidal thoughts 0 - - - -  PHQ-9 Score 0 - - - -  Difficult doing work/chores Not difficult at all - - - -     Fall Risk: Fall Risk  03/16/2018 11/10/2017 06/23/2017 10/15/2016 06/03/2016  Falls in the past year? 0 Yes No No No  Number falls in past yr: - 1 - - -  Injury with Fall? - Yes - - -  Risk Factor Category  - High Fall Risk - - -  Risk for fall due to : - Impaired vision;History of fall(s);Impaired balance/gait - - -  Risk for fall due to: Comment - wears eyeglasses; ambulates with cane - - -  Follow up - Falls evaluation completed;Education provided;Falls prevention discussed - - -     Assessment & Plan  1. X-linked hypophosphatemic rickets  - VITAMIN D 25 Hydroxy (Vit-D Deficiency, Fractures)  2. Flu vaccine need  - Flu vaccine HIGH DOSE PF  3. Dyslipidemia  - omega-3 acid ethyl esters (LOVAZA) 1 g capsule; Take 1 capsule (1 g total) by mouth 2 (two) times daily.  Dispense: 180 capsule; Refill: 1  4. Hypertension, benign  - hydrALAZINE (APRESOLINE) 10 MG tablet; Take 1 tablet (10 mg total) by mouth 3 (three) times daily. If bp above 150/90  Dispense: 90 tablet; Refill: 0 - TSH - COMPLETE METABOLIC PANEL WITH GFR - CBC with Differential/Platelet - Urine Microalbumin w/creat. ratio - Parathyroid hormone, intact (no Ca)  5. GAD (generalized anxiety disorder)  - ALPRAZolam (XANAX) 0.5 MG tablet; Take 0.5-1 tablets (0.25-0.5 mg total) by mouth at bedtime as needed. for anxiety  Dispense: 30 tablet; Refill: 0 - busPIRone (BUSPAR) 5 MG tablet; Take 1 tablet (5 mg total) by mouth 3 (three) times daily as needed.  Dispense: 90 tablet; Refill: 0  6. Controlled gout  Uric acid  7. Hypertriglyceridemia  -  Lipid panel  8. B12 deficiency  - Vitamin B12  9. Hyperglycemia  - Hemoglobin A1c  10. Uses walker   11. Gait instability   12.  Disorder of phosphorus metabolism  Check phosphorus  13. Paresthesia of foot, bilateral   15. Elevated uric acid in blood  - Uric acid

## 2018-03-17 LAB — LIPID PANEL
Cholesterol: 218 mg/dL — ABNORMAL HIGH (ref <200)
HDL: 60 mg/dL (ref >50)
LDL Cholesterol (Calc): 112 mg/dL — ABNORMAL HIGH
Non-HDL Cholesterol (Calc): 158 mg/dL — ABNORMAL HIGH (ref <130)
Total CHOL/HDL Ratio: 3.6 (calc) (ref <5.0)
Triglycerides: 344 mg/dL — ABNORMAL HIGH (ref <150)

## 2018-03-17 LAB — COMPLETE METABOLIC PANEL WITH GFR
AG RATIO: 1.5 (calc) (ref 1.0–2.5)
ALKALINE PHOSPHATASE (APISO): 192 U/L — AB (ref 33–130)
ALT: 14 U/L (ref 6–29)
AST: 19 U/L (ref 10–35)
Albumin: 4.5 g/dL (ref 3.6–5.1)
BILIRUBIN TOTAL: 0.5 mg/dL (ref 0.2–1.2)
BUN/Creatinine Ratio: 18 (calc) (ref 6–22)
BUN: 17 mg/dL (ref 7–25)
CHLORIDE: 102 mmol/L (ref 98–110)
CO2: 29 mmol/L (ref 20–32)
Calcium: 9.9 mg/dL (ref 8.6–10.4)
Creat: 0.96 mg/dL — ABNORMAL HIGH (ref 0.60–0.93)
GFR, Est African American: 69 mL/min/{1.73_m2} (ref >=60)
GFR, Est Non African American: 60 mL/min/{1.73_m2} (ref >=60)
GLOBULIN: 3 g/dL (ref 1.9–3.7)
Glucose, Bld: 101 mg/dL — ABNORMAL HIGH (ref 65–99)
Potassium: 5.1 mmol/L (ref 3.5–5.3)
SODIUM: 138 mmol/L (ref 135–146)
Total Protein: 7.5 g/dL (ref 6.1–8.1)

## 2018-03-17 LAB — CBC WITH DIFFERENTIAL/PLATELET
BASOS ABS: 67 {cells}/uL (ref 0–200)
Basophils Relative: 0.7 %
EOS ABS: 413 {cells}/uL (ref 15–500)
EOS PCT: 4.3 %
HCT: 42.6 % (ref 35.0–45.0)
Hemoglobin: 13.9 g/dL (ref 11.7–15.5)
Lymphs Abs: 2371 cells/uL (ref 850–3900)
MCH: 28 pg (ref 27.0–33.0)
MCHC: 32.6 g/dL (ref 32.0–36.0)
MCV: 85.7 fL (ref 80.0–100.0)
MONOS PCT: 5.9 %
MPV: 12 fL (ref 7.5–12.5)
NEUTROS ABS: 6182 {cells}/uL (ref 1500–7800)
Neutrophils Relative %: 64.4 %
PLATELETS: 297 10*3/uL (ref 140–400)
RBC: 4.97 10*6/uL (ref 3.80–5.10)
RDW: 12.7 % (ref 11.0–15.0)
TOTAL LYMPHOCYTE: 24.7 %
WBC mixed population: 566 cells/uL (ref 200–950)
WBC: 9.6 10*3/uL (ref 3.8–10.8)

## 2018-03-17 LAB — MICROALBUMIN / CREATININE URINE RATIO
CREATININE, URINE: 62 mg/dL (ref 20–275)
MICROALB UR: 2.7 mg/dL
MICROALB/CREAT RATIO: 44 ug/mg{creat} — AB (ref <30)

## 2018-03-17 LAB — HEMOGLOBIN A1C
Hgb A1c MFr Bld: 5.5 %{Hb} (ref <5.7)
Mean Plasma Glucose: 111 (calc)
eAG (mmol/L): 6.2 (calc)

## 2018-03-17 LAB — URIC ACID: Uric Acid, Serum: 7 mg/dL (ref 2.5–7.0)

## 2018-03-17 LAB — TSH: TSH: 1.67 m[IU]/L (ref 0.40–4.50)

## 2018-03-17 LAB — PHOSPHORUS: PHOSPHORUS: 2.9 mg/dL (ref 2.1–4.3)

## 2018-03-17 LAB — PARATHYROID HORMONE, INTACT (NO CA): PTH: 135 pg/mL — ABNORMAL HIGH (ref 14–64)

## 2018-03-17 LAB — VITAMIN D 25 HYDROXY (VIT D DEFICIENCY, FRACTURES): Vit D, 25-Hydroxy: 18 ng/mL — ABNORMAL LOW (ref 30–100)

## 2018-03-17 LAB — VITAMIN B12: Vitamin B-12: 1001 pg/mL (ref 200–1100)

## 2018-03-19 ENCOUNTER — Telehealth: Payer: Self-pay | Admitting: Family Medicine

## 2018-03-19 ENCOUNTER — Ambulatory Visit: Payer: Self-pay | Admitting: *Deleted

## 2018-03-19 DIAGNOSIS — I10 Essential (primary) hypertension: Secondary | ICD-10-CM

## 2018-03-19 NOTE — Telephone Encounter (Signed)
Patient called in because she has some questions about hydrALAZINE (APRESOLINE) 10 MG tablet and busPIRone (BUSPAR) 5 MG tablet. She states she is taking hydrALAZINE (APRESOLINE) 10 MG tablet but hasnt taken busPIRone (BUSPAR) 5 MG tablet because she doesn't know for sure when to take it.  Reason for Disposition . Caller has medication question only, adult not sick, and triager answers question    Caller has question and answered per instruction in chart.  Answer Assessment - Initial Assessment Questions 1. SYMPTOMS: "Do you have any symptoms?"     Patient has question about new Rx given at last visit.  Protocols used: MEDICATION QUESTION CALL-A-AH  Call to patient- she states she is taking 3 apresoline daily. It is not keeping her BP where she wants it - so she wants to start the buspirone. Told patient that is fine- she is going to start to build on the dosing- 1 to 2 to 3 daily. To see how she does and if her BP gets better. She will keep the office informed.

## 2018-03-19 NOTE — Telephone Encounter (Signed)
Copied from [place] [id number]. Topic: General - Other >> Mar 19, 2018 11:14 AM Lennox Solders wrote: Reason for CRM: pt only pick up  5 bystolic [id number] mg on 22-44-97 at [place] . Pt needs a refill [facility] to [facility] [address] [place] [facility] there is a delay in transfer rx . Pt is out of medication. Pt saw dr Ancil Boozer on 03-16-18

## 2018-03-24 MED ORDER — NEBIVOLOL HCL 10 MG PO TABS: 10.0000 mg | ORAL_TABLET | Freq: Every day | ORAL | 3 refills | Status: DC

## 2018-03-24 NOTE — Telephone Encounter (Signed)
Left voicemail with instructions on how to take the medication.

## 2018-03-24 NOTE — Telephone Encounter (Signed)
Hydralazine is only as needed if bp very high above 150/90  Buspar is to be taken in place of alprazolam for anxiety and she can take it daily or prn

## 2018-05-12 ENCOUNTER — Ambulatory Visit: Payer: Medicare Other | Admitting: Family Medicine

## 2018-05-19 ENCOUNTER — Ambulatory Visit: Payer: Medicare Other | Admitting: Family Medicine

## 2018-05-19 ENCOUNTER — Other Ambulatory Visit: Payer: Self-pay | Admitting: Family Medicine

## 2018-05-19 ENCOUNTER — Encounter: Payer: Self-pay | Admitting: Family Medicine

## 2018-05-19 VITALS — BP 140/80 | HR 62 | Temp 97.9°F | Resp 16 | Ht <= 58 in | Wt 126.1 lb

## 2018-05-19 DIAGNOSIS — I10 Essential (primary) hypertension: Secondary | ICD-10-CM | POA: Diagnosis not present

## 2018-05-19 DIAGNOSIS — F411 Generalized anxiety disorder: Secondary | ICD-10-CM

## 2018-05-19 MED ORDER — BUSPIRONE HCL 5 MG PO TABS: 5.0000 mg | ORAL_TABLET | Freq: Three times a day (TID) | ORAL | 0 refills | Status: DC | PRN

## 2018-05-19 MED ORDER — HYDRALAZINE HCL 10 MG PO TABS: 10.0000 mg | ORAL_TABLET | Freq: Three times a day (TID) | ORAL | 0 refills | Status: DC

## 2018-05-19 NOTE — Progress Notes (Signed)
Name: Felicia Robinson   MRN: [id number]    DOB: [birth year]   Date:05/19/2018       Progress Note  Subjective  Chief Complaint  Chief Complaint  Patient presents with  . Hypertension    HPI  HTN: she has hypertension, bp at homewas used to be in the 120's range,  bp had been elevated during Cryvista injections and  bp went up to 180's/100's, since last visit whe has finished the trial and no longer taking injections. BP is still elevated at home since Nov, initially stressed because of holidays, and now , she states she has been very upset about her brother that has been very sick BP at home still going up 180/100 , but usually up to 160's, she is taking hydralazine prn and it drops her bp. No chest pain or palpitation . Offered referral to cardiologist but she wants to take medications TID for anxiety and see if bp comes down   GAD:.Started on Duloxetine Nov 2018, she was on 30 mg and now she is on 60 mg but still feels anxious, we will added buspar back in Nov, she states it helps with symptoms but currently only taking as needed, usually twice daily, advised to go up to TID every day to see if improves bp and anxiety.    Patient Active Problem List   Diagnosis Date Noted  . Stress fracture of right femur with delayed healing 04/27/2017  . Osteoarthritis, knee 07/26/2015  . Other bilateral secondary osteoarthritis of hip 07/26/2015  . Hypertension, benign 01/29/2015  . Controlled gout 01/29/2015  . X-linked hypophosphatemic rickets 01/29/2015  . GAD (generalized anxiety disorder) 01/29/2015  . Gait instability 01/29/2015  . Recurrent falls 01/29/2015  . Acquired scoliosis 01/29/2015  . History of breast cancer in female 09/08/2012  . H/O rickets 09/08/2012  . History of shingles 12/06/2011    Past Surgical History:  Procedure Laterality Date  . BREAST SURGERY Left 09/30/2011   Left simple mastectomy with sentinel node biopsy  . CATARACT EXTRACTION, BILATERAL Bilateral 04/2015    Dr. Tommy Rainwater in [place]  . COLONOSCOPY  2012  . DILATION AND CURETTAGE OF UTERUS  1982  . MASTECTOMY Left 2013   with SN biopsy  . TONSILLECTOMY  1953  . UPPER GI ENDOSCOPY  2012    Family History  Problem Relation Age of Onset  . Lymphoma Brother 23       Hodgkin's Lymphoma  . Cancer Brother        non hodgkins lymphoma   . Heart disease Brother   . Cancer Maternal Uncle 70       cancer of the esophagus  . Alzheimer's disease Mother   . Stroke Father   . Breast cancer Neg Hx     Social History   Socioeconomic History  . Marital status: Married    Spouse name: Sonny  . Number of children: 1  . Years of education: some college  . Highest education level: 12th grade  Occupational History  . Occupation: Retired  Scientific laboratory technician  . Financial resource strain: Not hard at all  . Food insecurity:    Worry: Never true    Inability: Never true  . Transportation needs:    Medical: No    Non-medical: No  Tobacco Use  . Smoking status: Former Smoker    Packs/day: 0.50    Years: 30.00    Pack years: 15.00    Types: Cigarettes    Last attempt to quit:  04/21/2006    Years since quitting: 12.0  . Smokeless tobacco: Never Used  Substance and Sexual Activity  . Alcohol use: Yes    Alcohol/week: 1.0 standard drinks    Types: 1 Glasses of wine per week  . Drug use: No  . Sexual activity: Not Currently  Lifestyle  . Physical activity:    Days per week: 0 days    Minutes per session: 0 min  . Stress: Not at all  Relationships  . Social connections:    Talks on phone: More than three times a week    Gets together: More than three times a week    Attends religious service: Patient refused    Active member of club or organization: Patient refused    Attends meetings of clubs or organizations: Patient refused    Relationship status: Married  . Intimate partner violence:    Fear of current or ex partner: No    Emotionally abused: No    Physically abused: No    Forced  sexual activity: No  Other Topics Concern  . Not on file  Social History Narrative  . Not on file     Current Outpatient Medications:  .  ALPRAZolam (XANAX) 0.5 MG tablet, Take 0.5-1 tablets (0.25-0.5 mg total) by mouth at bedtime as needed. for anxiety, Disp: 30 tablet, Rfl: 0 .  aspirin 81 MG chewable tablet, Chew 81 mg by mouth daily., Disp: , Rfl:  .  busPIRone (BUSPAR) 5 MG tablet, Take 1 tablet (5 mg total) by mouth 3 (three) times daily as needed., Disp: 90 tablet, Rfl: 0 .  colchicine 0.6 MG tablet, Take 2 tablets (1.2 mg total) by mouth daily as needed. Max 1.8mg Bailey Mech, Disp: 30 tablet, Rfl: 0 .  Cyanocobalamin (B-12) 1000 MCG SUBL, Place 1 tablet under the tongue 3 (three) times a week., Disp: 30 each, Rfl: 2 .  hydrALAZINE (APRESOLINE) 10 MG tablet, Take 1 tablet (10 mg total) by mouth 3 (three) times daily. If bp above 150/90, Disp: 90 tablet, Rfl: 0 .  losartan (COZAAR) 100 MG tablet, Take 1 tablet (100 mg total) by mouth daily., Disp: 90 tablet, Rfl: 1 .  nebivolol (BYSTOLIC) 10 MG tablet, Take 1 tablet (10 mg total) by mouth daily., Disp: 30 tablet, Rfl: 3 .  omega-3 acid ethyl esters (LOVAZA) 1 g capsule, Take 1 capsule (1 g total) by mouth 2 (two) times daily., Disp: 180 capsule, Rfl: 1 .  Burosumab-twza (CRYSVITA) 30 MG/ML SOLN, Inject 60 mg into the skin every 30 (thirty) days. (Patient not taking: Reported on 05/19/2018), Disp: 2 mL, Rfl: 0 .  DULoxetine (CYMBALTA) 60 MG capsule, Take 1 capsule (60 mg total) by mouth daily. (Patient not taking: Reported on 05/19/2018), Disp: 90 capsule, Rfl: 1  Allergies  Allergen Reactions  . Allopurinol     diarrhea  . Codeine Nausea And Vomiting  . Other     Pain medications Altered mental status  . Shellfish Allergy Diarrhea and Nausea And Vomiting    Just when eating clams Just when eating clams Just when eating clams     I personally reviewed active problem list, medication list, allergies, family history, social history  with the patient/caregiver today.   ROS  Constitutional: Negative for fever or weight change.  Respiratory: Negative for cough and shortness of breath.   Cardiovascular: Negative for chest pain or palpitations.  Gastrointestinal: Negative for abdominal pain, no bowel changes.  Musculoskeletal:Positive for gait problem and joint deformities  Skin: Negative  for rash.  Neurological: Negative for dizziness or headache.  No other specific complaints in a complete review of systems (except as listed in HPI above).  Objective  Vitals:   05/19/18 0935  BP: 140/80  Pulse: 62  Resp: 16  Temp: 97.9 F (36.6 C)  TempSrc: Oral  SpO2: 96%  Weight: 126 lb 1.6 oz (57.2 kg)  Height: 4\' 7"  (1.397 m)    Body mass index is 29.31 kg/m.  Physical Exam  Constitutional: Patient appears well-developed and well-nourished. Overweight.  No distress.  HEENT: head atraumatic, normocephalic, pupils equal and reactive to light, neck supple, throat within normal limits Cardiovascular: Normal rate, regular rhythm and normal heart sounds.  No murmur heard. No BLE edema. Pulmonary/Chest: Effort normal and breath sounds normal. No respiratory distress. Abdominal: Soft.  There is no tenderness. Muscular skeletal: very small stature, slow gait and bowed legged  Psychiatric: Patient has a normal mood and affect. behavior is normal. Judgment and thought content normal.  Recent Results (from the past 2160 hour(s))  Lipid panel     Status: Abnormal   Collection Time: 03/16/18  2:07 PM  Result Value Ref Range   Cholesterol 218 (H) <200 mg/dL   HDL 60 >50 mg/dL   Triglycerides 344 (H) <150 mg/dL    Comment: . If a non-fasting specimen was collected, consider repeat triglyceride testing on a fasting specimen if clinically indicated.  Yates Decamp et al. J. of Clin. Lipidol. 8119;1:478-295. Marland Kitchen    LDL Cholesterol (Calc) 112 (H) mg/dL (calc)    Comment: Reference range: <100 . Desirable range <100 mg/dL for  primary prevention;   <70 mg/dL for patients with CHD or diabetic patients  with > or = 2 CHD risk factors. Marland Kitchen LDL-C is now calculated using the Martin-Hopkins  calculation, which is a validated novel method providing  better accuracy than the Friedewald equation in the  estimation of LDL-C.  Cresenciano Genre et al. Annamaria Helling. 6213;086(57): 2061-2068  (http://education.[facility].com/faq/FAQ164)    Total CHOL/HDL Ratio 3.6 <5.0 (calc)   Non-HDL Cholesterol (Calc) 158 (H) <130 mg/dL (calc)    Comment: For patients with diabetes plus 1 major ASCVD risk  factor, treating to a non-HDL-C goal of <100 mg/dL  (LDL-C of <70 mg/dL) is considered a therapeutic  option.   Vitamin B12     Status: None   Collection Time: 03/16/18  2:07 PM  Result Value Ref Range   Vitamin B-12 1,001 200 - 1,100 pg/mL  TSH     Status: None   Collection Time: 03/16/18  2:07 PM  Result Value Ref Range   TSH 1.67 0.40 - 4.50 mIU/L  VITAMIN D 25 Hydroxy (Vit-D Deficiency, Fractures)     Status: Abnormal   Collection Time: 03/16/18  2:07 PM  Result Value Ref Range   Vit D, 25-Hydroxy 18 (L) 30 - 100 ng/mL    Comment: Vitamin D Status         25-OH Vitamin D: . Deficiency:                    <20 ng/mL Insufficiency:             20 - 29 ng/mL Optimal:                 > or = 30 ng/mL . For 25-OH Vitamin D testing on patients on  D2-supplementation and patients for whom quantitation  of D2 and D3 fractions is required, the QuestAssureD(TM) 25-OH VIT D, (D2,D3), LC/MS/MS  is recommended: order  code [phone] (patients >42yrs). . For more information on this test, go to: http://education.[facility].com/faq/FAQ163 (This link is being provided for  informational/educational purposes only.)   Hemoglobin A1c     Status: None   Collection Time: 03/16/18  2:07 PM  Result Value Ref Range   Hgb A1c MFr Bld 5.5 <5.7 % of total Hgb    Comment: For the purpose of screening for the presence of diabetes: . <5.7%        Consistent with the absence of diabetes 5.7-6.4%    Consistent with increased risk for diabetes             (prediabetes) > or =6.5%  Consistent with diabetes . This assay result is consistent with a decreased risk of diabetes. . Currently, no consensus exists regarding use of hemoglobin A1c for diagnosis of diabetes in children. . According to American Diabetes Association (ADA) guidelines, hemoglobin A1c <7.0% represents optimal control in non-pregnant diabetic patients. Different metrics may apply to specific patient populations.  Standards of Medical Care in Diabetes(ADA). .    Mean Plasma Glucose 111 (calc)   eAG (mmol/L) 6.2 (calc)  COMPLETE METABOLIC PANEL WITH GFR     Status: Abnormal   Collection Time: 03/16/18  2:07 PM  Result Value Ref Range   Glucose, Bld 101 (H) 65 - 99 mg/dL    Comment: .            Fasting reference interval . For someone without known diabetes, a glucose value between 100 and 125 mg/dL is consistent with prediabetes and should be confirmed with a follow-up test. .    BUN 17 7 - 25 mg/dL   Creat 0.96 (H) 0.60 - 0.93 mg/dL    Comment: For patients >21 years of age, the reference limit for Creatinine is approximately 13% higher for people identified as African-American. .    GFR, Est Non African American 60 > OR = 60 mL/min/1.23m2   GFR, Est African American 69 > OR = 60 mL/min/1.57m2   BUN/Creatinine Ratio 18 6 - 22 (calc)   Sodium 138 135 - 146 mmol/L   Potassium 5.1 3.5 - 5.3 mmol/L   Chloride 102 98 - 110 mmol/L   CO2 29 20 - 32 mmol/L   Calcium 9.9 8.6 - 10.4 mg/dL   Total Protein 7.5 6.1 - 8.1 g/dL   Albumin 4.5 3.6 - 5.1 g/dL   Globulin 3.0 1.9 - 3.7 g/dL (calc)   AG Ratio 1.5 1.0 - 2.5 (calc)   Total Bilirubin 0.5 0.2 - 1.2 mg/dL   Alkaline phosphatase (APISO) 192 (H) 33 - 130 U/L   AST 19 10 - 35 U/L   ALT 14 6 - 29 U/L  CBC with Differential/Platelet     Status: None   Collection Time: 03/16/18  2:07 PM  Result Value  Ref Range   WBC 9.6 3.8 - 10.8 Thousand/uL   RBC 4.97 3.80 - 5.10 Million/uL   Hemoglobin 13.9 11.7 - 15.5 g/dL   HCT 42.6 35.0 - 45.0 %   MCV 85.7 80.0 - 100.0 fL   MCH 28.0 27.0 - 33.0 pg   MCHC 32.6 32.0 - 36.0 g/dL   RDW 12.7 11.0 - 15.0 %   Platelets 297 140 - 400 Thousand/uL   MPV 12.0 7.5 - 12.5 fL   Neutro Abs 6,182 1,500 - 7,800 cells/uL   Lymphs Abs 2,371 850 - 3,900 cells/uL   WBC mixed population 566 200 - 950 cells/uL  Eosinophils Absolute 413 15 - 500 cells/uL   Basophils Absolute 67 0 - 200 cells/uL   Neutrophils Relative % 64.4 %   Total Lymphocyte 24.7 %   Monocytes Relative 5.9 %   Eosinophils Relative 4.3 %   Basophils Relative 0.7 %  Urine Microalbumin w/creat. ratio     Status: Abnormal   Collection Time: 03/16/18  2:07 PM  Result Value Ref Range   Creatinine, Urine 62 20 - 275 mg/dL   Microalb, Ur 2.7 mg/dL    Comment: Reference Range Not established    Microalb Creat Ratio 44 (H) <30 mcg/mg creat    Comment: . The ADA defines abnormalities in albumin excretion as follows: Marland Kitchen Category         Result (mcg/mg creatinine) . Normal                    <30 Microalbuminuria         30-299  Clinical albuminuria   > OR = 300 . The ADA recommends that at least two of three specimens collected within a 3-6 month period be abnormal before considering a patient to be within a diagnostic category.   Parathyroid hormone, intact (no Ca)     Status: Abnormal   Collection Time: 03/16/18  2:07 PM  Result Value Ref Range   PTH 135 (H) 14 - 64 pg/mL    Comment: . Interpretive Guide    Intact PTH           Calcium ------------------    ----------           ------- Normal Parathyroid    Normal               Normal Hypoparathyroidism    Low or Low Normal    Low Hyperparathyroidism    Primary            Normal or High       High    Secondary          High                 Normal or Low    Tertiary           High                 High Non-Parathyroid     Hypercalcemia      Low or Low Normal    High .   Uric acid     Status: None   Collection Time: 03/16/18  2:07 PM  Result Value Ref Range   Uric Acid, Serum 7.0 2.5 - 7.0 mg/dL    Comment: Therapeutic target for gout patients: <6.0 mg/dL .   Phosphorus     Status: None   Collection Time: 03/16/18  2:07 PM  Result Value Ref Range   Phosphorus 2.9 2.1 - 4.3 mg/dL      PHQ2/9: Depression screen [facility] 2/9 11/10/2017 10/15/2016 06/03/2016 01/30/2016 07/26/2015  Decreased Interest 0 0 0 0 0  Down, Depressed, Hopeless 0 0 0 0 0  PHQ - 2 Score 0 0 0 0 0  Altered sleeping 0 - - - -  Tired, decreased energy 0 - - - -  Change in appetite 0 - - - -  Feeling bad or failure about yourself  0 - - - -  Trouble concentrating 0 - - - -  Moving slowly or fidgety/restless 0 - - - -  Suicidal thoughts 0 - - - -  PHQ-9 Score 0 - - - -  Difficult doing work/chores Not difficult at all - - - -     Fall Risk: Fall Risk  05/19/2018 03/16/2018 11/10/2017 06/23/2017 10/15/2016  Falls in the past year? 0 0 Yes No No  Number falls in past yr: 0 - 1 - -  Injury with Fall? 0 - Yes - -  Risk Factor Category  - - High Fall Risk - -  Risk for fall due to : - - Impaired vision;History of fall(s);Impaired balance/gait - -  Risk for fall due to: Comment - - wears eyeglasses; ambulates with cane - -  Follow up - - Falls evaluation completed;Education provided;Falls prevention discussed - -     Assessment & Plan   1. Hypertension, benign  - hydrALAZINE (APRESOLINE) 10 MG tablet; Take 1 tablet (10 mg total) by mouth 3 (three) times daily. If bp above 150/90  Dispense: 90 tablet; Refill: 0  2. GAD (generalized anxiety disorder)  - busPIRone (BUSPAR) 5 MG tablet; Take 1 tablet (5 mg total) by mouth 3 (three) times daily as needed.  Dispense: 180 tablet; Refill: 0

## 2018-05-24 ENCOUNTER — Ambulatory Visit: Payer: Medicare Other | Admitting: Family Medicine

## 2018-05-27 ENCOUNTER — Other Ambulatory Visit: Payer: Self-pay | Admitting: Family Medicine

## 2018-05-27 DIAGNOSIS — I10 Essential (primary) hypertension: Secondary | ICD-10-CM

## 2018-06-28 ENCOUNTER — Other Ambulatory Visit: Payer: Self-pay | Admitting: Family Medicine

## 2018-06-28 DIAGNOSIS — M109 Gout, unspecified: Secondary | ICD-10-CM

## 2018-06-28 MED ORDER — COLCHICINE 0.6 MG PO TABS: 1.2000 mg | ORAL_TABLET | Freq: Every day | ORAL | 1 refills | Status: DC | PRN

## 2018-06-28 NOTE — Telephone Encounter (Signed)
Copied from [place] [id number]. Topic: Quick Communication - Rx Refill/Question >> Jun 28, 2018 10:33 AM Reyne Dumas L wrote: Medication: colchicine 0.6 MG tablet  Has the patient contacted their pharmacy? Yes - states she needs new refill (Agent: If no, request that the patient contact the pharmacy for the refill.) (Agent: If yes, when and what did the pharmacy advise?)  Preferred Pharmacy (with phone number or street name): [id number], [place] [phone] (Phone) [phone] (Fax)  Agent: Please be advised that RX refills may take up to 3 business days. We ask that you follow-up with your pharmacy.

## 2018-07-09 ENCOUNTER — Other Ambulatory Visit: Payer: Self-pay | Admitting: Family Medicine

## 2018-07-09 DIAGNOSIS — F411 Generalized anxiety disorder: Secondary | ICD-10-CM

## 2018-07-09 DIAGNOSIS — I10 Essential (primary) hypertension: Secondary | ICD-10-CM

## 2018-07-09 MED ORDER — BUSPIRONE HCL 5 MG PO TABS: 5.0000 mg | ORAL_TABLET | Freq: Three times a day (TID) | ORAL | 0 refills | Status: DC | PRN

## 2018-07-09 MED ORDER — HYDRALAZINE HCL 10 MG PO TABS: 10.0000 mg | ORAL_TABLET | Freq: Three times a day (TID) | ORAL | 0 refills | Status: DC

## 2018-07-09 NOTE — Telephone Encounter (Signed)
Copied from [place] [id number]. Topic: Quick Communication - Rx Refill/Question >> Jul 09, 2018  2:58 PM Margot Ables wrote: Medication: nebivolol (BYSTOLIC) 10 MG tablet - pt will be out in about 1 wk (prior to appt scheduled 07/19/2018) busPIRone (BUSPAR) 5 MG tablet  hydrALAZINE (APRESOLINE) 10 MG tablet   Has the patient contacted their pharmacy? No - no refills Preferred Pharmacy (with phone number or street name): [id number] [address], [place] [phone] (Phone) [phone] (Fax)

## 2018-07-19 ENCOUNTER — Encounter: Payer: Self-pay | Admitting: Family Medicine

## 2018-07-19 ENCOUNTER — Ambulatory Visit (INDEPENDENT_AMBULATORY_CARE_PROVIDER_SITE_OTHER): Payer: Medicare Other | Admitting: Family Medicine

## 2018-07-19 DIAGNOSIS — F411 Generalized anxiety disorder: Secondary | ICD-10-CM | POA: Diagnosis not present

## 2018-07-19 DIAGNOSIS — I10 Essential (primary) hypertension: Secondary | ICD-10-CM | POA: Diagnosis not present

## 2018-07-19 DIAGNOSIS — E539 Vitamin B deficiency, unspecified: Secondary | ICD-10-CM | POA: Diagnosis not present

## 2018-07-19 DIAGNOSIS — E785 Hyperlipidemia, unspecified: Secondary | ICD-10-CM | POA: Diagnosis not present

## 2018-07-19 MED ORDER — BUSPIRONE HCL 5 MG PO TABS: 5.0000 mg | ORAL_TABLET | Freq: Three times a day (TID) | ORAL | 2 refills | Status: DC | PRN

## 2018-07-19 MED ORDER — LOSARTAN POTASSIUM 100 MG PO TABS: 100.0000 mg | ORAL_TABLET | Freq: Every day | ORAL | 1 refills | Status: DC

## 2018-07-19 MED ORDER — OMEGA-3-ACID ETHYL ESTERS 1 G PO CAPS: 1.0000 g | ORAL_CAPSULE | Freq: Two times a day (BID) | ORAL | 1 refills | Status: DC

## 2018-07-19 MED ORDER — ALPRAZOLAM 0.5 MG PO TABS: 0.2500 mg | ORAL_TABLET | Freq: Every evening | ORAL | 0 refills | Status: DC | PRN

## 2018-07-19 MED ORDER — DULOXETINE HCL 60 MG PO CPEP: 60.0000 mg | ORAL_CAPSULE | Freq: Every day | ORAL | 1 refills | Status: DC

## 2018-07-19 NOTE — Progress Notes (Signed)
Name: Felicia Robinson   MRN: [id number]    DOB: [birth year]   Date:07/19/2018       Progress Note  Subjective  Chief Complaint  Chief Complaint  Patient presents with  . Hypertension  . Anxiety    I connected with@ on 07/19/18 at 10:20 AM EDT by telephone and verified that I am speaking with the correct person using two identifiers.  I discussed the limitations, risks, security and privacy concerns of performing an evaluation and management service by telephone and the availability of in person appointments. Staff also discussed with the patient that there may be a patient responsible charge related to this service. Patient Location: home  Provider Location: [facility]    HPI  HTN: she has hypertension, bp at homewas used to be in the 115-140's usually, occasionally goes up to 150-160's but does not stay up as long. She is compliant with medication and no side effects . She is taking hydralazine prn only   WUJ:[id number] on Duloxetine Nov 2018,taking 60 mg daily , we  added buspar back in Nov, she states it helps with symptoms but currently only taking as needed, usually TID daily.  She is still worried about her brother, he is going to have open heart surgery but is on hold for now.   Dyslipidemia: she needs a refill of Lovaza, no side effects at this time, we will recheck labs on her next visit   Patient Active Problem List   Diagnosis Date Noted  . Stress fracture of right femur with delayed healing 04/27/2017  . Osteoarthritis, knee 07/26/2015  . Other bilateral secondary osteoarthritis of hip 07/26/2015  . Hypertension, benign 01/29/2015  . Controlled gout 01/29/2015  . X-linked hypophosphatemic rickets 01/29/2015  . GAD (generalized anxiety disorder) 01/29/2015  . Gait instability 01/29/2015  . Recurrent falls 01/29/2015  . Acquired scoliosis 01/29/2015  . History of breast cancer in female 09/08/2012  . H/O rickets 09/08/2012  . History of shingles 12/06/2011     Past Surgical History:  Procedure Laterality Date  . BREAST SURGERY Left 09/30/2011   Left simple mastectomy with sentinel node biopsy  . CATARACT EXTRACTION, BILATERAL Bilateral 04/2015   Dr. Tommy Rainwater in [place]  . COLONOSCOPY  2012  . DILATION AND CURETTAGE OF UTERUS  1982  . MASTECTOMY Left 2013   with SN biopsy  . TONSILLECTOMY  1953  . UPPER GI ENDOSCOPY  2012    Family History  Problem Relation Age of Onset  . Lymphoma Brother 83       Hodgkin's Lymphoma  . Cancer Brother        non hodgkins lymphoma   . Heart disease Brother   . Cancer Maternal Uncle 70       cancer of the esophagus  . Alzheimer's disease Mother   . Stroke Father   . Breast cancer Neg Hx     Social History   Socioeconomic History  . Marital status: Married    Spouse name: Sonny  . Number of children: 1  . Years of education: some college  . Highest education level: 12th grade  Occupational History  . Occupation: Retired  Scientific laboratory technician  . Financial resource strain: Not hard at all  . Food insecurity:    Worry: Never true    Inability: Never true  . Transportation needs:    Medical: No    Non-medical: No  Tobacco Use  . Smoking status: Former Smoker    Packs/day: 0.50  Years: 30.00    Pack years: 15.00    Types: Cigarettes    Last attempt to quit: 04/21/2006    Years since quitting: 12.2  . Smokeless tobacco: Never Used  Substance and Sexual Activity  . Alcohol use: Yes    Alcohol/week: 1.0 standard drinks    Types: 1 Glasses of wine per week  . Drug use: No  . Sexual activity: Not Currently  Lifestyle  . Physical activity:    Days per week: 0 days    Minutes per session: 0 min  . Stress: Not at all  Relationships  . Social connections:    Talks on phone: More than three times a week    Gets together: More than three times a week    Attends religious service: Patient refused    Active member of club or organization: Patient refused    Attends meetings of clubs or  organizations: Patient refused    Relationship status: Married  . Intimate partner violence:    Fear of current or ex partner: No    Emotionally abused: No    Physically abused: No    Forced sexual activity: No  Other Topics Concern  . Not on file  Social History Narrative  . Not on file     Current Outpatient Medications:  .  ALPRAZolam (XANAX) 0.5 MG tablet, Take 0.5-1 tablets (0.25-0.5 mg total) by mouth at bedtime as needed. for anxiety, Disp: 30 tablet, Rfl: 0 .  aspirin 81 MG chewable tablet, Chew 81 mg by mouth daily., Disp: , Rfl:  .  busPIRone (BUSPAR) 5 MG tablet, Take 1 tablet (5 mg total) by mouth 3 (three) times daily as needed., Disp: 90 tablet, Rfl: 2 .  colchicine 0.6 MG tablet, Take 2 tablets (1.2 mg total) by mouth daily as needed. Max 1.8mg Bailey Mech, Disp: 30 tablet, Rfl: 1 .  Cyanocobalamin (B-12) 1000 MCG SUBL, Place 1 tablet under the tongue 3 (three) times a week., Disp: 30 each, Rfl: 2 .  DULoxetine (CYMBALTA) 60 MG capsule, Take 1 capsule (60 mg total) by mouth daily., Disp: 90 capsule, Rfl: 1 .  hydrALAZINE (APRESOLINE) 10 MG tablet, Take 1 tablet (10 mg total) by mouth 3 (three) times daily. If bp above 150/90, Disp: 90 tablet, Rfl: 0 .  losartan (COZAAR) 100 MG tablet, Take 1 tablet (100 mg total) by mouth daily., Disp: 90 tablet, Rfl: 1 .  nebivolol (BYSTOLIC) 10 MG tablet, Take 1 tablet (10 mg total) by mouth daily., Disp: 30 tablet, Rfl: 3 .  omega-3 acid ethyl esters (LOVAZA) 1 g capsule, Take 1 capsule (1 g total) by mouth 2 (two) times daily., Disp: 180 capsule, Rfl: 1  Allergies  Allergen Reactions  . Allopurinol     diarrhea  . Codeine Nausea And Vomiting  . Other     Pain medications Altered mental status  . Shellfish Allergy Diarrhea and Nausea And Vomiting    Just when eating clams Just when eating clams Just when eating clams     I personally reviewed active problem list, medication list, allergies, family history, social history with the  patient/caregiver today.   ROS  Constitutional: Negative for fever or weight change.  Respiratory: Negative for cough and shortness of breath.   Cardiovascular: Negative for chest pain or palpitations.  Gastrointestinal: Negative for abdominal pain, no bowel changes.  Musculoskeletal: positive  for gait problem but no  joint swelling.  Neurological: Negative for dizziness , but has occasional headache.  No other  specific complaints in a complete review of systems (except as listed in HPI above).  Objective  Virtual encounter, vitals not obtained.   Physical Exam  Not done, phone encounter   PHQ2/9: Depression screen [facility] 2/9 07/19/2018 11/10/2017 10/15/2016 06/03/2016 01/30/2016  Decreased Interest 1 0 0 0 0  Down, Depressed, Hopeless 1 0 0 0 0  PHQ - 2 Score 2 0 0 0 0  Altered sleeping 0 0 - - -  Tired, decreased energy 1 0 - - -  Change in appetite 0 0 - - -  Feeling bad or failure about yourself  0 0 - - -  Trouble concentrating 0 0 - - -  Moving slowly or fidgety/restless 1 0 - - -  Suicidal thoughts 0 0 - - -  PHQ-9 Score 4 0 - - -  Difficult doing work/chores Somewhat difficult Not difficult at all - - -   PHQ-2/9 Result is positive.  Her moving slowly is chronic   Fall Risk: Fall Risk  07/19/2018 05/19/2018 03/16/2018 11/10/2017 06/23/2017  Falls in the past year? 0 0 0 Yes No  Number falls in past yr: 0 0 - 1 -  Injury with Fall? 0 0 - Yes -  Risk Factor Category  - - - High Fall Risk -  Risk for fall due to : - - - Impaired vision;History of fall(s);Impaired balance/gait -  Risk for fall due to: Comment - - - wears eyeglasses; ambulates with cane -  Follow up - - - Falls evaluation completed;Education provided;Falls prevention discussed -     Assessment & Plan  1. GAD (generalized anxiety disorder)  - DULoxetine (CYMBALTA) 60 MG capsule; Take 1 capsule (60 mg total) by mouth daily.  Dispense: 90 capsule; Refill: 1 - busPIRone (BUSPAR) 5 MG tablet; Take 1 tablet  (5 mg total) by mouth 3 (three) times daily as needed.  Dispense: 90 tablet; Refill: 2 - ALPRAZolam (XANAX) 0.5 MG tablet; Take 0.5-1 tablets (0.25-0.5 mg total) by mouth at bedtime as needed. for anxiety  Dispense: 30 tablet; Refill: 0  2. Burning feet syndrome  - DULoxetine (CYMBALTA) 60 MG capsule; Take 1 capsule (60 mg total) by mouth daily.  Dispense: 90 capsule; Refill: 1  3. Dyslipidemia  - omega-3 acid ethyl esters (LOVAZA) 1 g capsule; Take 1 capsule (1 g total) by mouth 2 (two) times daily.  Dispense: 180 capsule; Refill: 1  4. Hypertension, benign  - losartan (COZAAR) 100 MG tablet; Take 1 tablet (100 mg total) by mouth daily.  Dispense: 90 tablet; Refill: 1  I discussed the assessment and treatment plan with the patient. The patient was provided an opportunity to ask questions and all were answered. The patient agreed with the plan and demonstrated an understanding of the instructions.   The patient was advised to call back or seek an in-person evaluation if the symptoms worsen or if the condition fails to improve as anticipated.  I provided 21 minutes of non-face-to-face time during this encounter.  Loistine Chance, MD

## 2018-08-16 ENCOUNTER — Other Ambulatory Visit: Payer: Self-pay

## 2018-08-16 DIAGNOSIS — Z1231 Encounter for screening mammogram for malignant neoplasm of breast: Secondary | ICD-10-CM

## 2018-09-24 ENCOUNTER — Telehealth: Payer: Self-pay | Admitting: Family Medicine

## 2018-09-24 NOTE — Telephone Encounter (Signed)
Copied from [place] [id number]. Topic: Quick Communication - Rx Refill/Question >> Sep 24, 2018  5:41 PM Gustavus Messing wrote: Medication: hydrALAZINE (APRESOLINE) 10 MG tablet [[id number]]  busPIRone (BUSPAR) 5 MG tablet [[id number]]   Has the patient contacted their pharmacy?   Preferred Pharmacy (with phone number or street name): [facility] [id number], [place] [phone] (Phone) [phone] (Fax)    Agent: Please be advised that RX refills may take up to 3 business days. We ask that you follow-up with your pharmacy.

## 2018-09-24 NOTE — Telephone Encounter (Signed)
Copied from [place] [id number]. Topic: Quick Communication - Rx Refill/Question >> Sep 24, 2018  5:41 PM Gustavus Messing wrote: Medication: hydrALAZINE (APRESOLINE) 10 MG tablet [[id number]]  busPIRone (BUSPAR) 5 MG tablet [[id number]]   Has the patient contacted their pharmacy? No. (Agent: If no, request that the patient contact the pharmacy for the refill.) (Agent: If yes, when and what did the pharmacy advise?)  Preferred Pharmacy (with phone number or street name): [facility] [id number], [place] [phone] (Phone) [phone] (Fax)    Agent: Please be advised that RX refills may take up to 3 business days. We ask that you follow-up with your pharmacy.

## 2018-09-27 ENCOUNTER — Other Ambulatory Visit: Payer: Self-pay | Admitting: Emergency Medicine

## 2018-09-27 ENCOUNTER — Other Ambulatory Visit: Payer: Self-pay | Admitting: Family Medicine

## 2018-09-27 DIAGNOSIS — I10 Essential (primary) hypertension: Secondary | ICD-10-CM

## 2018-09-27 DIAGNOSIS — F411 Generalized anxiety disorder: Secondary | ICD-10-CM

## 2018-09-27 MED ORDER — HYDRALAZINE HCL 10 MG PO TABS: 10.0000 mg | ORAL_TABLET | Freq: Three times a day (TID) | ORAL | 0 refills | Status: DC

## 2018-09-27 MED ORDER — BUSPIRONE HCL 5 MG PO TABS: 5.0000 mg | ORAL_TABLET | Freq: Three times a day (TID) | ORAL | 0 refills | Status: DC | PRN

## 2018-09-27 NOTE — Telephone Encounter (Signed)
Refills sent to [facility]

## 2018-10-09 ENCOUNTER — Other Ambulatory Visit: Payer: Self-pay | Admitting: Family Medicine

## 2018-10-09 DIAGNOSIS — I10 Essential (primary) hypertension: Secondary | ICD-10-CM

## 2018-10-28 ENCOUNTER — Other Ambulatory Visit: Payer: Self-pay

## 2018-10-28 ENCOUNTER — Ambulatory Visit
Admission: RE | Admit: 2018-10-28 | Discharge: 2018-10-28 | Disposition: A | Discharge status: Home / Self Care | Payer: Medicare Other | Source: Ambulatory Visit | Attending: General Surgery | Admitting: General Surgery

## 2018-10-28 DIAGNOSIS — Z1231 Encounter for screening mammogram for malignant neoplasm of breast: Secondary | ICD-10-CM | POA: Insufficient documentation

## 2018-11-02 ENCOUNTER — Ambulatory Visit: Payer: Medicare Other | Admitting: General Surgery

## 2018-11-16 ENCOUNTER — Other Ambulatory Visit: Payer: Self-pay | Admitting: Family Medicine

## 2018-11-16 ENCOUNTER — Ambulatory Visit: Payer: Medicare Other | Admitting: Family Medicine

## 2018-11-16 ENCOUNTER — Other Ambulatory Visit: Payer: Self-pay

## 2018-11-16 ENCOUNTER — Encounter: Payer: Self-pay | Admitting: Family Medicine

## 2018-11-16 ENCOUNTER — Ambulatory Visit (INDEPENDENT_AMBULATORY_CARE_PROVIDER_SITE_OTHER): Payer: Medicare Other

## 2018-11-16 VITALS — BP 170/106 | HR 72 | Temp 97.1°F | Resp 17 | Ht <= 58 in | Wt 132.9 lb

## 2018-11-16 VITALS — BP 158/100 | HR 72 | Temp 97.1°F | Resp 16 | Ht <= 58 in | Wt 132.9 lb

## 2018-11-16 DIAGNOSIS — Z0000 Encounter for general adult medical examination without abnormal findings: Secondary | ICD-10-CM | POA: Diagnosis not present

## 2018-11-16 DIAGNOSIS — E785 Hyperlipidemia, unspecified: Secondary | ICD-10-CM

## 2018-11-16 DIAGNOSIS — M109 Gout, unspecified: Secondary | ICD-10-CM | POA: Diagnosis not present

## 2018-11-16 DIAGNOSIS — E781 Pure hyperglyceridemia: Secondary | ICD-10-CM | POA: Diagnosis not present

## 2018-11-16 DIAGNOSIS — E559 Vitamin D deficiency, unspecified: Secondary | ICD-10-CM

## 2018-11-16 DIAGNOSIS — E538 Deficiency of other specified B group vitamins: Secondary | ICD-10-CM | POA: Diagnosis not present

## 2018-11-16 DIAGNOSIS — F411 Generalized anxiety disorder: Secondary | ICD-10-CM

## 2018-11-16 DIAGNOSIS — I10 Essential (primary) hypertension: Secondary | ICD-10-CM

## 2018-11-16 DIAGNOSIS — R2681 Unsteadiness on feet: Secondary | ICD-10-CM

## 2018-11-16 DIAGNOSIS — E539 Vitamin B deficiency, unspecified: Secondary | ICD-10-CM

## 2018-11-16 MED ORDER — BUSPIRONE HCL 5 MG PO TABS: 5.0000 mg | ORAL_TABLET | Freq: Three times a day (TID) | ORAL | 0 refills | Status: DC | PRN

## 2018-11-16 MED ORDER — VALSARTAN 320 MG PO TABS: 320.0000 mg | ORAL_TABLET | Freq: Every day | ORAL | 0 refills | Status: DC

## 2018-11-16 MED ORDER — HYDRALAZINE HCL 25 MG PO TABS: 25.0000 mg | ORAL_TABLET | Freq: Three times a day (TID) | ORAL | 0 refills | Status: DC

## 2018-11-16 MED ORDER — NEBIVOLOL HCL 10 MG PO TABS: 10.0000 mg | ORAL_TABLET | Freq: Every day | ORAL | 0 refills | Status: DC

## 2018-11-16 MED ORDER — ALPRAZOLAM 0.5 MG PO TABS: 0.2500 mg | ORAL_TABLET | Freq: Every evening | ORAL | 0 refills | Status: DC | PRN

## 2018-11-16 NOTE — Progress Notes (Signed)
Name: Felicia Robinson   MRN: [id number]    DOB: [birth year]   Date:11/16/2018       Progress Note  Subjective  Chief Complaint  Chief Complaint  Patient presents with  . Anxiety  . Hypertension  . Gastroesophageal Reflux  . Medication Refill    4 month F/U    HPI  HTN: she has hypertension, bp at [facility] to be in [id number] usually, occasionally goes up to 150-160's but does not stay up as long, she states usually goes up when stressed but improves with rest. She is compliant with medication and no side effects . She is taking hydralazine prn only, but not sure if brings bp down, we will change dose to 25 mg and monitor    [id number]:[id number] on Duloxetine Nov 2018,taking 60 mg daily , we  added buspar back in Nov but only taking it prn, she feels like it helps with her bp, advised to take it daily.  Brother is doing better, he is at home but still worries about him, thinking about spending a prolonged time at the beach now. Husband cannot drive because of loss of vision and she is worried he is developing dementia.  Dyslipidemia: she needs a refill of Lovaza, no side effects at this time, unable to get labs today, not fasting  X-linked hypophosphatemia: she is seeing Dr. Delane Ginger, and took [place] and was doing well on medication, however medication was stopped last Fall after stress fractures had healed. She states having more joint pains, having to use a cane again and is frustrated, advised to discuss it with Dr Truddie Coco  GAD: taking alprazolam seldom, taking duloxetine and buspar prn. She is still very stressed , advised to take Buspar TID instead of prn   Gout: She has not been taking colcrys prn, she had to stop Allopurinol because it caused diarrhea.  Unchanged   Breast Cancer left side: dx in 2013, she finished Tamoxifen,completed5 years Spring 2018. She still follows up with Dr. Fleet Contras.Last mammogram 10/2018. Unchanged   Burning feet : she has intermittent  burning sensation on both of her feet, not daily, discussed options, gabapentin, lyrica and duloxetine.She has been usingaspar cream with lidocaineandalso taking duloxetine and has helped some with symptoms. Still happens weekly but not as often or intense.   History of stress fracture of femurs and right hip, seen by Dr. Rudene Christians on cane only     Patient Active Problem List   Diagnosis Date Noted  . Stress fracture of right femur with delayed healing 04/27/2017  . Osteoarthritis, knee 07/26/2015  . Other bilateral secondary osteoarthritis of hip 07/26/2015  . Hypertension, benign 01/29/2015  . Controlled gout 01/29/2015  . X-linked hypophosphatemic rickets 01/29/2015  . GAD (generalized anxiety disorder) 01/29/2015  . Gait instability 01/29/2015  . Recurrent falls 01/29/2015  . Acquired scoliosis 01/29/2015  . History of breast cancer in female 09/08/2012  . H/O rickets 09/08/2012  . History of shingles 12/06/2011    Past Surgical History:  Procedure Laterality Date  . BREAST SURGERY Left 09/30/2011   Left simple mastectomy with sentinel node biopsy  . CATARACT EXTRACTION, BILATERAL Bilateral 04/2015   Dr. Tommy Rainwater in [place]  . COLONOSCOPY  2012  . DILATION AND CURETTAGE OF UTERUS  1982  . MASTECTOMY Left 2013   with SN biopsy  . TONSILLECTOMY  1953  . UPPER GI ENDOSCOPY  2012    Family History  Problem Relation Age of Onset  . Lymphoma Brother 42  Hodgkin's Lymphoma  . Cancer Brother        non hodgkins lymphoma   . Heart disease Brother   . Cancer Maternal Uncle 70       cancer of the esophagus  . Alzheimer's disease Mother   . Stroke Father   . Breast cancer Neg Hx     Social History   Socioeconomic History  . Marital status: Married    Spouse name: Sonny  . Number of children: 1  . Years of education: some college  . Highest education level: 12th grade  Occupational History  . Occupation: Retired  Scientific laboratory technician  . Financial resource strain:  Not hard at all  . Food insecurity    Worry: Never true    Inability: Never true  . Transportation needs    Medical: No    Non-medical: No  Tobacco Use  . Smoking status: Former Smoker    Packs/day: 0.50    Years: 30.00    Pack years: 15.00    Types: Cigarettes    Quit date: 04/21/2006    Years since quitting: 12.5  . Smokeless tobacco: Never Used  Substance and Sexual Activity  . Alcohol use: Yes    Alcohol/week: 1.0 standard drinks    Types: 1 Glasses of wine per week  . Drug use: No  . Sexual activity: Not Currently  Lifestyle  . Physical activity    Days per week: 0 days    Minutes per session: 0 min  . Stress: Rather much  Relationships  . Social connections    Talks on phone: More than three times a week    Gets together: More than three times a week    Attends religious service: Patient refused    Active member of club or organization: Patient refused    Attends meetings of clubs or organizations: Patient refused    Relationship status: Married  . Intimate partner violence    Fear of current or ex partner: No    Emotionally abused: No    Physically abused: No    Forced sexual activity: No  Other Topics Concern  . Not on file  Social History Narrative  . Not on file     Current Outpatient Medications:  .  ALPRAZolam (XANAX) 0.5 MG tablet, Take 0.5-1 tablets (0.25-0.5 mg total) by mouth at bedtime as needed. for anxiety, Disp: 30 tablet, Rfl: 0 .  aspirin 81 MG chewable tablet, Chew 81 mg by mouth daily., Disp: , Rfl:  .  busPIRone (BUSPAR) 5 MG tablet, Take 1 tablet (5 mg total) by mouth 3 (three) times daily as needed., Disp: 90 tablet, Rfl: 0 .  BYSTOLIC 10 MG tablet, TAKE 1 TABLET(10 MG) BY MOUTH DAILY, Disp: 30 tablet, Rfl: 0 .  cholecalciferol (VITAMIN D3) 25 MCG (1000 UT) tablet, Take 1,000 Units by mouth daily., Disp: , Rfl:  .  colchicine 0.6 MG tablet, Take 2 tablets (1.2 mg total) by mouth daily as needed. Max 1.8mg Bailey Mech, Disp: 30 tablet, Rfl: 1 .   Cyanocobalamin (B-12) 1000 MCG SUBL, Place 1 tablet under the tongue 3 (three) times a week., Disp: 30 each, Rfl: 2 .  DULoxetine (CYMBALTA) 60 MG capsule, Take 1 capsule (60 mg total) by mouth daily., Disp: 90 capsule, Rfl: 1 .  hydrALAZINE (APRESOLINE) 10 MG tablet, Take 1 tablet (10 mg total) by mouth 3 (three) times daily. If bp above 150/90, Disp: 90 tablet, Rfl: 0 .  losartan (COZAAR) 100 MG tablet, Take 1 tablet (  100 mg total) by mouth daily., Disp: 90 tablet, Rfl: 1 .  omega-3 acid ethyl esters (LOVAZA) 1 g capsule, Take 1 capsule (1 g total) by mouth 2 (two) times daily., Disp: 180 capsule, Rfl: 1  Allergies  Allergen Reactions  . Allopurinol     diarrhea  . Codeine Nausea And Vomiting  . Other     Pain medications Altered mental status  . Shellfish Allergy Diarrhea and Nausea And Vomiting    Just when eating clams Just when eating clams Just when eating clams     I personally reviewed active problem list, medication list, allergies, family history, social history with the patient/caregiver today.   ROS  Constitutional: Negative for fever, positive for  weight change.  Respiratory: Negative for cough and shortness of breath.   Cardiovascular: Negative for chest pain or palpitations.  Gastrointestinal: Negative for abdominal pain, no bowel changes.  Musculoskeletal: Positive for gait problem but no  joint swelling.  Skin: Negative for rash.  Neurological: Negative for dizziness or headache.  No other specific complaints in a complete review of systems (except as listed in HPI above).  Objective  Vitals:   11/16/18 1407 11/16/18 1411  BP: (!) 170/106 (!) 158/100  Pulse: 72   Resp: 16   Temp: (!) 97.1 F (36.2 C)   TempSrc: Temporal   SpO2: 96%   Weight: 132 lb 14.4 oz (60.3 kg)   Height: 4\' 7"  (1.397 m)     Body mass index is 30.89 kg/m.  Physical Exam  Constitutional: Patient appears well-developed and well-nourished. Obese No distress.  HEENT: head  atraumatic, normocephalic, pupils equal and reactive to light, neck supple Cardiovascular: Normal rate, regular rhythm and normal heart sounds.  No murmur heard. No BLE edema. Pulmonary/Chest: Effort normal and breath sounds normal. No respiratory distress. Abdominal: Soft.  There is no tenderness. Muscular Skeletal: using a cane. Psychiatric: Patient has a normal mood and affect. behavior is normal. Judgment and thought content normal.  PHQ2/9: Depression screen [facility] 2/9 11/16/2018 07/19/2018 11/10/2017 10/15/2016 06/03/2016  Decreased Interest 1 1 0 0 0  Down, Depressed, Hopeless 1 1 0 0 0  PHQ - 2 Score 2 2 0 0 0  Altered sleeping 1 0 0 - -  Tired, decreased energy 1 1 0 - -  Change in appetite 0 0 0 - -  Feeling bad or failure about yourself  0 0 0 - -  Trouble concentrating 1 0 0 - -  Moving slowly or fidgety/restless 1 1 0 - -  Suicidal thoughts 0 0 0 - -  PHQ-9 Score 6 4 0 - -  Difficult doing work/chores Not difficult at all Somewhat difficult Not difficult at all - -    phq 9 is positive   Fall Risk: Fall Risk  11/16/2018 07/19/2018 05/19/2018 03/16/2018 11/10/2017  Falls in the past year? 1 0 0 0 Yes  Number falls in past yr: 1 0 0 - 1  Injury with Fall? 1 0 0 - Yes  Risk Factor Category  - - - - High Fall Risk  Risk for fall due to : Impaired balance/gait;History of fall(s) - - - Impaired vision;History of fall(s);Impaired balance/gait  Risk for fall due to: Comment - - - - wears eyeglasses; ambulates with cane  Follow up Falls prevention discussed - - - Falls evaluation completed;Education provided;Falls prevention discussed    Assessment & Plan  1. X-linked hypophosphatemic rickets  - Parathyroid hormone, intact (no Ca) - VITAMIN D  25 Hydroxy (Vit-D Deficiency, Fractures)  2. B12 deficiency  - B12  3. Hypertriglyceridemia  Recheck labs when fasting   4. Controlled gout  - Uric acid  5. Gait instability   6. Disorder of phosphorus metabolism   7.  Hypertension, benign  - COMPLETE METABOLIC PANEL WITH GFR - CBC with Differential/Platelet - valsartan (DIOVAN) 320 MG tablet; Take 1 tablet (320 mg total) by mouth daily.  Dispense: 90 tablet; Refill: 0 - hydrALAZINE (APRESOLINE) 25 MG tablet; Take 1 tablet (25 mg total) by mouth 3 (three) times daily. If bp above 150/90  Dispense: 90 tablet; Refill: 0 - nebivolol (BYSTOLIC) 10 MG tablet; Take 1 tablet (10 mg total) by mouth daily.  Dispense: 90 tablet; Refill: 0  8. Dyslipidemia   9. GAD (generalized anxiety disorder)  - ALPRAZolam (XANAX) 0.5 MG tablet; Take 0.5-1 tablets (0.25-0.5 mg total) by mouth at bedtime as needed. for anxiety  Dispense: 30 tablet; Refill: 0 - busPIRone (BUSPAR) 5 MG tablet; Take 1 tablet (5 mg total) by mouth 3 (three) times daily as needed.  Dispense: 270 tablet; Refill: 0  10. Burning feet syndrome  Doing better   11. Vitamin D deficiency  - VITAMIN D 25 Hydroxy (Vit-D Deficiency, Fractures)

## 2018-11-16 NOTE — Progress Notes (Signed)
Subjective:   Felicia Robinson is a 73 y.o. female who presents for Medicare Annual (Subsequent) preventive examination.  Review of Systems:   Cardiac Risk Factors include: advanced age (>39mn, >>23women);dyslipidemia;hypertension;obesity (BMI >30kg/m2)     Objective:     Vitals: BP (!) 170/106 (BP Location: Right Arm, Patient Position: Sitting, Cuff Size: Normal)   Pulse 72   Temp (!) 97.1 F (36.2 C) (Other (Comment)) Comment (Src): forehead  Resp 17   Ht _0  (1.397 m)   Wt 132 lb 14.4 oz (60.3 kg)   SpO2 96%   BMI 30.89 kg/m   Body mass index is 30.89 kg/m.  Advanced Directives 11/16/2018 11/10/2017 10/15/2016 06/03/2016 01/30/2016 07/26/2015 01/29/2015  Does Patient Have a Medical Advance Directive? _1  Yes No  Type of AParamedicof APort AransasLiving will HWaimeaLiving will HSar[place]Living will HBrainerdLiving will H[place]Living will H[place]Living will -  Does patient want to make changes to medical advance directive? - - - - No - Patient declined No - Patient declined -  Copy of [place] Chart? No - copy requested No - copy requested Yes - Yes No - copy requested -  Would patient like information on creating a medical advance directive? - - - - - - No - patient declined information    Tobacco Social History   Tobacco Use  Smoking Status Former Smoker  . Packs/day: 0.50  . Years: 30.00  . Pack years: 15.00  . Types: Cigarettes  . Quit date: 04/21/2006  . Years since quitting: 12.5  Smokeless Tobacco Never Used     Counseling given: Not Answered   Clinical Intake:  Pre-visit preparation completed: Yes  Pain : No/denies pain     BMI - recorded: 30.89 Nutritional Status: BMI > 30  Obese Nutritional Risks: None Diabetes: No  How often do you need to have someone help you when you read  instructions, pamphlets, or other written materials from your doctor or pharmacy?: 1 - Never  Interpreter Needed?: No  Information entered by :: KClemetine MarkerLPN  Past Medical History:  Diagnosis Date  . [place] cancer [place]   left breast  . Cancer ([place]   L breast  . Disorders of phosphorus metabolism    rickets  . Fibroadenosis of breast   . Gout 2013  . Hypertension 2009  . Malignant neoplasm of upper-outer quadrant of female breast ([facility] 09/30/2011   7 mm low-grade invasive mammary carcinoma with focal DCIS. ER 90%, PR 1%, HER-2/neu not overexpressing.  . Osteomalacia   . Shingles 2013   Past Surgical History:  Procedure Laterality Date  . BREAST SURGERY Left 09/30/2011   Left simple mastectomy with sentinel node biopsy  . CATARACT EXTRACTION, BILATERAL Bilateral 04/2015   [place] . COLONOSCOPY  2012  . DILATION AND CURETTAGE OF UTERUS  1982  . MASTECTOMY Left 2013   with SN biopsy  . TONSILLECTOMY  1953  . UPPER GI ENDOSCOPY  2012   Family History  Problem Relation Age of Onset  . Lymphoma Brother 481      Hodgkin's Lymphoma  . Cancer Brother        non hodgkins lymphoma   . Heart disease Brother   . Cancer Maternal Uncle 70       cancer of the esophagus  . Alzheimer's  disease Mother   . Stroke Father   . Breast cancer Neg Hx    Social History   Socioeconomic History  . Marital status: Married    Spouse name: Sonny  . Number of children: 1  . Years of education: some college  . Highest education level: 12th grade  Occupational History  . Occupation: Retired  Scientific laboratory technician  . Financial resource strain: Not hard at all  . Food insecurity    Worry: Never true    Inability: Never true  . Transportation needs    Medical: No    Non-medical: No  Tobacco Use  . Smoking status: Former Smoker    Packs/day: 0.50    Years: 30.00    Pack years: 15.00    Types: Cigarettes    Quit date: 04/21/2006    Years since quitting: 12.5  .  Smokeless tobacco: Never Used  Substance and Sexual Activity  . Alcohol use: Yes    Alcohol/week: 1.0 standard drinks    Types: 1 Glasses of wine per week  . Drug use: No  . Sexual activity: Not Currently  Lifestyle  . Physical activity    Days per week: 0 days    Minutes per session: 0 min  . Stress: Rather much  Relationships  . Social connections    Talks on phone: More than three times a week    Gets together: More than three times a week    Attends religious service: Patient refused    Active member of club or organization: Patient refused    Attends meetings of clubs or organizations: Patient refused    Relationship status: Married  Other Topics Concern  . Not on file  Social History Narrative  . Not on file    Outpatient Encounter Medications as of 11/16/2018  Medication Sig  . ALPRAZolam (XANAX) 0.5 MG tablet Take 0.5-1 tablets (0.25-0.5 mg total) by mouth at bedtime as needed. for anxiety  . aspirin 81 MG chewable tablet Chew 81 mg by mouth daily.  . busPIRone (BUSPAR) 5 MG tablet Take 1 tablet (5 mg total) by mouth 3 (three) times daily as needed.  Marland Kitchen BYSTOLIC 10 MG tablet TAKE 1 TABLET(10 MG) BY MOUTH DAILY  . cholecalciferol (VITAMIN D3) 25 MCG (1000 UT) tablet Take 1,000 Units by mouth daily.  . colchicine 0.6 MG tablet Take 2 tablets (1.2 mg total) by mouth daily as needed. Max 1.23m/24hrs  . Cyanocobalamin (B-12) 1000 MCG SUBL Place 1 tablet under the tongue 3 (three) times a week.  . DULoxetine (CYMBALTA) 60 MG capsule Take 1 capsule (60 mg total) by mouth daily.  . hydrALAZINE (APRESOLINE) 10 MG tablet Take 1 tablet (10 mg total) by mouth 3 (three) times daily. If bp above 150/90  . losartan (COZAAR) 100 MG tablet Take 1 tablet (100 mg total) by mouth daily.  .Marland Kitchenomega-3 acid ethyl esters (LOVAZA) 1 g capsule Take 1 capsule (1 g total) by mouth 2 (two) times daily.   No facility-administered encounter medications on file as of 11/16/2018.     Activities of Daily  Living In your present state of health, do you have any difficulty performing the following activities: 11/16/2018 07/19/2018  Hearing? Y Y  Comment declines hearing aids, c/o wax build up -  Vision? N N  Difficulty concentrating or making decisions? N N  Walking or climbing stairs? N Y  Dressing or bathing? N N  Doing errands, shopping? N N  Preparing Food and eating ? N -  Using the Toilet? N -  In the past six months, have you accidently leaked urine? Y -  Comment wears liners/pad for protection -  Do you have problems with loss of bowel control? N -  Managing your Medications? N -  Managing your Finances? N -  Housekeeping or managing your Housekeeping? N -  Some recent data might be hidden    Patient Care Team: Steele Sizer, MD as PCP - General (Family Medicine) Bary Castilla, Forest Gleason, MD (General Surgery) Lona Millard, MD as Consulting Physician (Endocrinology)    Assessment:   This is a routine wellness examination for [place].  Exercise Activities and Dietary recommendations Current Exercise Habits: The patient does not participate in regular exercise at present, Exercise limited by: orthopedic condition(s);neurologic condition(s)  Goals    . DIET - INCREASE WATER INTAKE     Recommend to drink at least 6-8 8oz glasses of water per day.    . Weight (lb) < 120 lb (54.4 kg)     Pt would like to lose at least 10 lbs over the next year.        Fall Risk Fall Risk  11/16/2018 07/19/2018 05/19/2018 03/16/2018 11/10/2017  Falls in the past year? 1 0 0 0 Yes  Number falls in past yr: 1 0 0 - 1  Injury with Fall? 1 0 0 - Yes  Risk Factor Category  - - - - High Fall Risk  Risk for fall due to : Impaired balance/gait;History of fall(s) - - - Impaired vision;History of fall(s);Impaired balance/gait  Risk for fall due to: Comment - - - - wears eyeglasses; ambulates with cane  Follow up Falls prevention discussed - - - Falls evaluation completed;Education provided;Falls prevention  discussed   FALL RISK PREVENTION PERTAINING TO THE HOME:  Any stairs in or around the home? Yes  If so, do they handrails? Yes   Home free of loose throw rugs in walkways, pet beds, electrical cords, etc? Yes  Adequate lighting in your home to reduce risk of falls? Yes   ASSISTIVE DEVICES UTILIZED TO PREVENT FALLS:  Life alert? No  Use of a cane, walker or w/c? Yes  Grab bars in the bathroom? No  Shower chair or bench in shower? Yes  Elevated toilet seat or a handicapped toilet? No   DME ORDERS:  DME order needed?  No   TIMED UP AND GO:  Was the test performed? Yes .  Length of time to ambulate 10 feet: 7 sec.   GAIT:  Appearance of gait: Gait slow, steady and with the use of an assistive device.   Education: Fall risk prevention has been discussed.  Intervention(s) required? No   Depression Screen PHQ 2/9 Scores 11/16/2018 07/19/2018 11/10/2017 10/15/2016  PHQ - 2 Score 2 2 0 0  PHQ- 9 Score 6 4 0 -     Cognitive Function     6CIT Screen 11/16/2018 11/10/2017  What Year? 0 points 0 points  What month? 0 points 0 points  What time? 0 points 0 points  Count back from 20 0 points 0 points  Months in reverse 0 points 0 points  Repeat phrase 0 points 0 points  Total Score 0 0    Immunization History  Administered Date(s) Administered  . Influenza, High Dose Seasonal PF 01/29/2015, 01/30/2016, 02/23/2017, 03/16/2018  . Influenza-Unspecified 01/12/2014  . Pneumococcal Conjugate-13 01/29/2015  . Pneumococcal Polysaccharide-23 05/01/2011, 06/03/2016  . Tdap 09/20/2009    Qualifies for Shingles Vaccine? Yes .  Due for Shingrix. Education has been provided regarding the importance of this vaccine. Pt has been advised to call insurance company to determine out of pocket expense. Advised may also receive vaccine at local pharmacy or Health Dept. Verbalized acceptance and understanding. Pt has had shingles twice and declines Shingles vaccine.   Tdap: Up to date  Flu  Vaccine: Up to date  Pneumococcal Vaccine: Up to date    Screening Tests Health Maintenance  Topic Date Due  . Fecal DNA (Cologuard)  08/20/1996  . INFLUENZA VACCINE  11/20/2018  . TETANUS/TDAP  09/21/2019  . MAMMOGRAM  10/28/2019  . DEXA SCAN  Completed  . Hepatitis C Screening  Completed  . PNA vac Low Risk Adult  Completed    Cancer Screenings:  Colorectal Screening: Completed 07/18/10. Repeat every 10 years;   Mammogram: Completed 10/28/18. Repeat every year;   Bone Density: Completed 08/17/12. Results reflect OSTEOPENIA in femur, OSTEOPOROSIS in forearm. Repeat every 2 years. Pt to discuss with Dr. Ancil Boozer.    Lung Cancer Screening: (Low Dose CT Chest recommended if Age 68-80 years, 30 pack-year currently smoking OR have quit w/in 15years.) does not qualify.    Additional Screening:  Hepatitis C Screening: does qualify; Completed 09/14/12  Vision Screening: Recommended annual ophthalmology exams for early detection of glaucoma and other disorders of the eye. Is the patient up to date with their annual eye exam?  No  appt r/s due to Covid-19 Who is the provider or what is the name of the office in which the pt attends annual eye exams? Dr. Gloriann Loan  Dental Screening: Recommended annual dental exams for proper oral hygiene  Community Resource Referral:  CRR required this visit?  No      Plan:     I have personally reviewed and addressed the Medicare Annual Wellness questionnaire and have noted the following in the patient's chart:  A. Medical and social history B. Use of alcohol, tobacco or illicit drugs  C. Current medications and supplements D. Functional ability and status E.  Nutritional status F.  Physical activity G. Advance directives H. List of other physicians I.  Hospitalizations, surgeries, and ER visits in previous 12 months J.  [place] such as hearing and vision if needed, cognitive and depression L. Referrals and appointments   In  addition, I have reviewed and discussed with patient certain preventive protocols, quality metrics, and best practice recommendations. A written personalized care plan for preventive services as well as general preventive health recommendations were provided to patient.   Signed,  Clemetine Marker, LPN Nurse Health Advisor   Nurse Notes: pt c/o stress due to health concerns this year with her brother who she is very close to. She is also concerned with her blood pressure but denies headaches, blurred vision or chest pain. Pt seeing Dr. Ancil Boozer today for follow up.

## 2018-11-16 NOTE — Patient Instructions (Signed)
Felicia Robinson , Thank you for taking time to come for your Medicare Wellness Visit. I appreciate your ongoing commitment to your health goals. Please review the following plan we discussed and let me know if I can assist you in the future.   Screening recommendations/referrals: Colonoscopy: done 07/18/10. Repeat in 2022.  Mammogram: done 10/28/18  Bone Density: done 08/17/12 Recommended yearly ophthalmology/optometry visit for glaucoma screening and checkup Recommended yearly dental visit for hygiene and checkup  Vaccinations: Influenza vaccine: done 03/16/18 Pneumococcal vaccine: done 06/03/16 Tdap vaccine: done 09/20/09 Shingles vaccine: Shingrix discussed. Please contact your pharmacy for coverage information.   Advanced directives: Please bring a copy of your health care power of attorney and living will to the office at your convenience.  Conditions/risks identified: Reduce stress to lower blood pressure.   Next appointment: Please follow up in one year for your Medicare Annual Wellness visit.     Preventive Care 72 Years and Older, Female Preventive care refers to lifestyle choices and visits with your health care provider that can promote health and wellness. What does preventive care include?  A yearly physical exam. This is also called an annual well check.  Dental exams once or twice a year.  Routine eye exams. Ask your health care provider how often you should have your eyes checked.  Personal lifestyle choices, including:  Daily care of your teeth and gums.  Regular physical activity.  Eating a healthy diet.  Avoiding tobacco and drug use.  Limiting alcohol use.  Practicing safe sex.  Taking low-dose aspirin every day.  Taking vitamin and mineral supplements as recommended by your health care provider. What happens during an annual well check? The services and screenings done by your health care provider during your annual well check will depend on your age,  overall health, lifestyle risk factors, and family history of disease. Counseling  Your health care provider may ask you questions about your:  Alcohol use.  Tobacco use.  Drug use.  Emotional well-being.  Home and relationship well-being.  Sexual activity.  Eating habits.  History of falls.  Memory and ability to understand (cognition).  Work and work Statistician.  Reproductive health. Screening  You may have the following tests or measurements:  Height, weight, and BMI.  Blood pressure.  Lipid and cholesterol levels. These may be checked every 5 years, or more frequently if you are over 24 years old.  Skin check.  Lung cancer screening. You may have this screening every year starting at age 36 if you have a 30-pack-year history of smoking and currently smoke or have quit within the past 15 years.  Fecal occult blood test (FOBT) of the stool. You may have this test every year starting at age 59.  Flexible sigmoidoscopy or colonoscopy. You may have a sigmoidoscopy every 5 years or a colonoscopy every 10 years starting at age 24.  Hepatitis C blood test.  Hepatitis B blood test.  Sexually transmitted disease (STD) testing.  Diabetes screening. This is done by checking your blood sugar (glucose) after you have not eaten for a while (fasting). You may have this done every 1-3 years.  Bone density scan. This is done to screen for osteoporosis. You may have this done starting at age 76.  Mammogram. This may be done every 1-2 years. Talk to your health care provider about how often you should have regular mammograms. Talk with your health care provider about your test results, treatment options, and if necessary, the need for more  tests. Vaccines  Your health care provider may recommend certain vaccines, such as:  Influenza vaccine. This is recommended every year.  Tetanus, diphtheria, and acellular pertussis (Tdap, Td) vaccine. You may need a Td booster every 10  years.  Zoster vaccine. You may need this after age 66.  Pneumococcal 13-valent conjugate (PCV13) vaccine. One dose is recommended after age 62.  Pneumococcal polysaccharide (PPSV23) vaccine. One dose is recommended after age 22. Talk to your health care provider about which screenings and vaccines you need and how often you need them. This information is not intended to replace advice given to you by your health care provider. Make sure you discuss any questions you have with your health care provider. Document Released: 05/04/2015 Document Revised: 12/26/2015 Document Reviewed: 02/06/2015 Elsevier Interactive Patient Education  2017 [place] Prevention in the Home Falls can cause injuries. They can happen to people of all ages. There are many things you can do to make your home safe and to help prevent falls. What can I do on the outside of my home?  Regularly fix the edges of walkways and driveways and fix any cracks.  Remove anything that might make you trip as you walk through a door, such as a raised step or threshold.  Trim any bushes or trees on the path to your home.  Use bright outdoor lighting.  Clear any walking paths of anything that might make someone trip, such as rocks or tools.  Regularly check to see if handrails are loose or broken. Make sure that both sides of any steps have handrails.  Any raised decks and porches should have guardrails on the edges.  Have any leaves, snow, or ice cleared regularly.  Use sand or salt on walking paths during winter.  Clean up any spills in your garage right away. This includes oil or grease spills. What can I do in the bathroom?  Use night lights.  Install grab bars by the toilet and in the tub and shower. Do not use towel bars as grab bars.  Use non-skid mats or decals in the tub or shower.  If you need to sit down in the shower, use a plastic, non-slip stool.  Keep the floor dry. Clean up any water that  spills on the floor as soon as it happens.  Remove soap buildup in the tub or shower regularly.  Attach bath mats securely with double-sided non-slip rug tape.  Do not have throw rugs and other things on the floor that can make you trip. What can I do in the bedroom?  Use night lights.  Make sure that you have a light by your bed that is easy to reach.  Do not use any sheets or blankets that are too big for your bed. They should not hang down onto the floor.  Have a firm chair that has side arms. You can use this for support while you get dressed.  Do not have throw rugs and other things on the floor that can make you trip. What can I do in the kitchen?  Clean up any spills right away.  Avoid walking on wet floors.  Keep items that you use a lot in easy-to-reach places.  If you need to reach something above you, use a strong step stool that has a grab bar.  Keep electrical cords out of the way.  Do not use floor polish or wax that makes floors slippery. If you must use wax, use non-skid floor  wax.  Do not have throw rugs and other things on the floor that can make you trip. What can I do with my stairs?  Do not leave any items on the stairs.  Make sure that there are handrails on both sides of the stairs and use them. Fix handrails that are broken or loose. Make sure that handrails are as long as the stairways.  Check any carpeting to make sure that it is firmly attached to the stairs. Fix any carpet that is loose or worn.  Avoid having throw rugs at the top or bottom of the stairs. If you do have throw rugs, attach them to the floor with carpet tape.  Make sure that you have a light switch at the top of the stairs and the bottom of the stairs. If you do not have them, ask someone to add them for you. What else can I do to help prevent falls?  Wear shoes that:  Do not have high heels.  Have rubber bottoms.  Are comfortable and fit you well.  Are closed at the  toe. Do not wear sandals.  If you use a stepladder:  Make sure that it is fully opened. Do not climb a closed stepladder.  Make sure that both sides of the stepladder are locked into place.  Ask someone to hold it for you, if possible.  Clearly mark and make sure that you can see:  Any grab bars or handrails.  First and last steps.  Where the edge of each step is.  Use tools that help you move around (mobility aids) if they are needed. These include:  Canes.  Walkers.  Scooters.  Crutches.  Turn on the lights when you go into a dark area. Replace any light bulbs as soon as they burn out.  Set up your furniture so you have a clear path. Avoid moving your furniture around.  If any of your floors are uneven, fix them.  If there are any pets around you, be aware of where they are.  Review your medicines with your doctor. Some medicines can make you feel dizzy. This can increase your chance of falling. Ask your doctor what other things that you can do to help prevent falls. This information is not intended to replace advice given to you by your health care provider. Make sure you discuss any questions you have with your health care provider. Document Released: 02/01/2009 Document Revised: 09/13/2015 Document Reviewed: 05/12/2014 Elsevier Interactive Patient Education  2017 [employer].

## 2018-11-17 LAB — CBC WITH DIFFERENTIAL/PLATELET
Absolute Monocytes: 596 cells/uL (ref 200–950)
Basophils Absolute: 71 cells/uL (ref 0–200)
Basophils Relative: 0.7 %
Eosinophils Absolute: 556 cells/uL — ABNORMAL HIGH (ref 15–500)
Eosinophils Relative: 5.5 %
HCT: 40.2 % (ref 35.0–45.0)
Hemoglobin: 13 g/dL (ref 11.7–15.5)
Lymphs Abs: 2444 cells/uL (ref 850–3900)
MCH: 26.9 pg — ABNORMAL LOW (ref 27.0–33.0)
MCHC: 32.3 g/dL (ref 32.0–36.0)
MCV: 83.1 fL (ref 80.0–100.0)
MPV: 12.1 fL (ref 7.5–12.5)
Monocytes Relative: 5.9 %
Neutro Abs: 6434 cells/uL (ref 1500–7800)
Neutrophils Relative %: 63.7 %
Platelets: 281 10*3/uL (ref 140–400)
RBC: 4.84 10*6/uL (ref 3.80–5.10)
RDW: 13.5 % (ref 11.0–15.0)
Total Lymphocyte: 24.2 %
WBC: 10.1 10*3/uL (ref 3.8–10.8)

## 2018-11-17 LAB — COMPLETE METABOLIC PANEL WITH GFR
AG Ratio: 1.4 (calc) (ref 1.0–2.5)
ALT: 14 U/L (ref 6–29)
AST: 22 U/L (ref 10–35)
Albumin: 4.2 g/dL (ref 3.6–5.1)
Alkaline phosphatase (APISO): 166 U/L — ABNORMAL HIGH (ref 37–153)
BUN: 18 mg/dL (ref 7–25)
CO2: 21 mmol/L (ref 20–32)
Calcium: 9.3 mg/dL (ref 8.6–10.4)
Chloride: 105 mmol/L (ref 98–110)
Creat: 0.83 mg/dL (ref 0.60–0.93)
GFR, Est African American: 82 mL/min/{1.73_m2} (ref >=60)
GFR, Est Non African American: 70 mL/min/{1.73_m2} (ref >=60)
Globulin: 2.9 g/dL (calc) (ref 1.9–3.7)
Glucose, Bld: 87 mg/dL (ref 65–99)
Potassium: 4.5 mmol/L (ref 3.5–5.3)
Sodium: 137 mmol/L (ref 135–146)
Total Bilirubin: 0.5 mg/dL (ref 0.2–1.2)
Total Protein: 7.1 g/dL (ref 6.1–8.1)

## 2018-11-17 LAB — PARATHYROID HORMONE, INTACT (NO CA): PTH: 94 pg/mL — ABNORMAL HIGH (ref 14–64)

## 2018-11-17 LAB — VITAMIN B12: Vitamin B-12: 640 pg/mL (ref 200–1100)

## 2018-11-17 LAB — VITAMIN D 25 HYDROXY (VIT D DEFICIENCY, FRACTURES): Vit D, 25-Hydroxy: 25 ng/mL — ABNORMAL LOW (ref 30–100)

## 2018-11-17 LAB — URIC ACID: Uric Acid, Serum: 7.3 mg/dL — ABNORMAL HIGH (ref 2.5–7.0)

## 2018-11-19 ENCOUNTER — Other Ambulatory Visit: Payer: Self-pay | Admitting: Family Medicine

## 2018-11-19 ENCOUNTER — Telehealth: Payer: Self-pay | Admitting: Family Medicine

## 2018-11-19 DIAGNOSIS — I10 Essential (primary) hypertension: Secondary | ICD-10-CM

## 2018-11-19 MED ORDER — OLMESARTAN MEDOXOMIL 40 MG PO TABS: 40.0000 mg | ORAL_TABLET | Freq: Every day | ORAL | 0 refills | Status: DC

## 2018-11-19 NOTE — Telephone Encounter (Signed)
Patient requesting call back from CMA to discuss if she should still be taking this medication.

## 2018-11-19 NOTE — Telephone Encounter (Signed)
Pt called stating she is very worried about her blood pressure medication. Attempted to get her in contact with someone and nobody was available. She is very frantic and very concerned. Pt is requesting this get taken care of before the weekend. Please advise.

## 2018-11-21 ENCOUNTER — Other Ambulatory Visit: Payer: Self-pay | Admitting: Family Medicine

## 2018-11-21 DIAGNOSIS — I10 Essential (primary) hypertension: Secondary | ICD-10-CM

## 2018-11-25 ENCOUNTER — Ambulatory Visit: Payer: Medicare Other | Admitting: General Surgery

## 2019-01-05 ENCOUNTER — Ambulatory Visit: Payer: Medicare Other | Admitting: Surgery

## 2019-01-14 ENCOUNTER — Other Ambulatory Visit: Payer: Self-pay | Admitting: Family Medicine

## 2019-01-14 DIAGNOSIS — I10 Essential (primary) hypertension: Secondary | ICD-10-CM

## 2019-01-14 NOTE — Telephone Encounter (Incomplete)
Medication Refill - Medication: hydrALAZINE (APRESOLINE) 25 MG tablet    Has the patient contacted their pharmacy? Yes.   (Agent: If no, request that the patient contact the pharmacy for the refill.) (Agent: If yes, when and what did the pharmacy advise?)  Preferred Pharmacy (with phone number or street name):  [facility] [id number], [place] [phone] (Phone) [phone] (Fax)     Agent: Please be advised that RX refills may take up to 3 business days. We ask that you follow-up with your pharmacy.

## 2019-01-17 ENCOUNTER — Other Ambulatory Visit: Payer: Self-pay | Admitting: Family Medicine

## 2019-01-17 DIAGNOSIS — I10 Essential (primary) hypertension: Secondary | ICD-10-CM

## 2019-01-17 MED ORDER — HYDRALAZINE HCL 25 MG PO TABS: 25.0000 mg | ORAL_TABLET | Freq: Three times a day (TID) | ORAL | 0 refills | Status: DC

## 2019-01-17 NOTE — Telephone Encounter (Signed)
Requested medication (s) are due for refill today: yes  Requested medication (s) are on the active medication list: yes  Last refill:  01/17/2019  Future visit scheduled: yes  Notes to clinic:  Requesting 90 day supply.   Requested Prescriptions  Pending Prescriptions Disp Refills   hydrALAZINE (APRESOLINE) 25 MG tablet [Pharmacy Med Name: HYDRALAZINE  25MG  TABLETS(ORANGE)] 270 tablet     Sig: TAKE 1 TABLET BY MOUTH THREE TIMES DAILY. IF BLOOD PRESSURE ABOVE 150/90     Cardiovascular:  Vasodilators Failed - 01/17/2019  1:24 PM      Failed - Last BP in normal range    BP Readings from Last 1 Encounters:  11/16/18 (!) 158/100         Passed - HCT in normal range and within 360 days    HCT  Date Value Ref Range Status  11/16/2018 40.2 35.0 - 45.0 % Final   Hematocrit  Date Value Ref Range Status  01/25/2016 39.4 34.0 - 46.6 % Final         Passed - HGB in normal range and within 360 days    Hemoglobin  Date Value Ref Range Status  11/16/2018 13.0 11.7 - 15.5 g/dL Final  01/25/2016 13.5 11.1 - 15.9 g/dL Final         Passed - RBC in normal range and within 360 days    RBC  Date Value Ref Range Status  11/16/2018 4.84 3.80 - 5.10 Million/uL Final         Passed - WBC in normal range and within 360 days    WBC  Date Value Ref Range Status  11/16/2018 10.1 3.8 - 10.8 Thousand/uL Final         Passed - PLT in normal range and within 360 days    Platelets  Date Value Ref Range Status  11/16/2018 281 140 - 400 Thousand/uL Final  01/25/2016 253 150 - 379 x10E3/uL Final         Passed - Valid encounter within last 12 months    Recent Outpatient Visits          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   [facility] Steele Sizer, MD   6 months ago GAD (generalized anxiety disorder)   [facility] Steele Sizer, MD   8 months ago Hypertension, benign   [facility] Fli[facility], Drue Stager, MD   10 months  ago X-linked hypophosphatemic rickets   [facility] Steele Sizer, MD   1 year ago Hypertension, benign   [facility] Steele Sizer, MD      Future Appointments            In 1 month Ancil Boozer, Drue Stager, MD [facility], [place]   In 10 months  [facility]

## 2019-01-20 ENCOUNTER — Encounter: Payer: Self-pay | Admitting: *Deleted

## 2019-02-16 ENCOUNTER — Other Ambulatory Visit: Payer: Self-pay

## 2019-02-16 ENCOUNTER — Encounter: Payer: Self-pay | Admitting: Family Medicine

## 2019-02-16 ENCOUNTER — Ambulatory Visit (INDEPENDENT_AMBULATORY_CARE_PROVIDER_SITE_OTHER): Payer: Medicare Other | Admitting: Family Medicine

## 2019-02-16 VITALS — BP 114/72 | HR 78 | Temp 97.1°F | Resp 16 | Ht <= 58 in | Wt 129.6 lb

## 2019-02-16 DIAGNOSIS — E781 Pure hyperglyceridemia: Secondary | ICD-10-CM | POA: Diagnosis not present

## 2019-02-16 DIAGNOSIS — M9080 Osteopathy in diseases classified elsewhere, unspecified site: Secondary | ICD-10-CM

## 2019-02-16 DIAGNOSIS — M109 Gout, unspecified: Secondary | ICD-10-CM

## 2019-02-16 DIAGNOSIS — R2681 Unsteadiness on feet: Secondary | ICD-10-CM

## 2019-02-16 DIAGNOSIS — Z23 Encounter for immunization: Secondary | ICD-10-CM | POA: Diagnosis not present

## 2019-02-16 DIAGNOSIS — I10 Essential (primary) hypertension: Secondary | ICD-10-CM | POA: Diagnosis not present

## 2019-02-16 DIAGNOSIS — E538 Deficiency of other specified B group vitamins: Secondary | ICD-10-CM

## 2019-02-16 DIAGNOSIS — E559 Vitamin D deficiency, unspecified: Secondary | ICD-10-CM

## 2019-02-16 DIAGNOSIS — E539 Vitamin B deficiency, unspecified: Secondary | ICD-10-CM

## 2019-02-16 DIAGNOSIS — F411 Generalized anxiety disorder: Secondary | ICD-10-CM

## 2019-02-16 DIAGNOSIS — E785 Hyperlipidemia, unspecified: Secondary | ICD-10-CM

## 2019-02-16 MED ORDER — NEBIVOLOL HCL 10 MG PO TABS: 10.0000 mg | ORAL_TABLET | Freq: Every day | ORAL | 1 refills | Status: DC

## 2019-02-16 MED ORDER — DULOXETINE HCL 60 MG PO CPEP: 60.0000 mg | ORAL_CAPSULE | Freq: Every day | ORAL | 1 refills | Status: DC

## 2019-02-16 MED ORDER — OLMESARTAN MEDOXOMIL 40 MG PO TABS: 40.0000 mg | ORAL_TABLET | Freq: Every day | ORAL | 1 refills | Status: DC

## 2019-02-16 MED ORDER — BUSPIRONE HCL 5 MG PO TABS: 5.0000 mg | ORAL_TABLET | Freq: Three times a day (TID) | ORAL | 1 refills | Status: DC | PRN

## 2019-02-16 NOTE — Progress Notes (Signed)
Name: Felicia Robinson   MRN: [id number]    DOB: 07-08-1945   Date:02/16/2019       Progress Note  Subjective  Chief Complaint  Chief Complaint  Patient presents with  . Medication Refill  . Hypertension  . GAD  . X-linked hypophosphatemia  . Gout    Has not had a flair up in a year     HPI  HTN: she has hypertension, bp at homehas been controlled since started on Buspar TID, she states when skip dose in the afternoon bp spikes.  She is taking Benicar 40 mg and Bystolic 10 mg, only taking Hydralazine 25 prn only   [id number] on Duloxetine Nov 2018,taking 60 mg daily ,we added buspar back in Nov 2019 but only taking it prn, she feels like it helps with her bp, and she  is doing well on Buspar TID.  Brother is doing better, he is at home but still worries about him, thinking about spending a prolonged time at the beach now. Husband cannot drive because of loss of vision and she is worried he is developing dementia.  Dyslipidemia: she needs a refill of Lovaza, no side effects at this time, discussed importance of returning fasting for labs   X-linked hypophosphatemia: she is seeing Dr. Delane Ginger, and took [place] and was doing well on medication, however medication was stopped last Fall after stress fractures had healed. She states having more joint pains, she states since she stopped medication she has been using a cane.   GAD: taking alprazolam seldom, taking duloxetine and buspar prn. She is still very stressed but doing well on buspar TID  Gout: She has not been taking colcrys prn, she had to stop Allopurinol because it caused diarrhea.Reviewed last level and explained elevated she does not want to try any other medications at this time, last flare over one year ago   Breast Cancer left side: dx in 2013, she finished Tamoxifen,completed5 years Spring 2018. She still follows up with Dr. Fleet Contras.Last mammogram 10/2018. Explained Dr. Fleet Contras is still practicing [place]  town  Burning feet : she has intermittent burning sensation on both of her feet, not daily, discussed options, gabapentin, lyrica and duloxetine.She has been usingaspar cream with lidocaineandalso taking duloxetine and has helped some with symptoms. Still happens weekly but has been doing well lately   History of stress fracture of femurs and right hip, seen by Dr. Rudene Christians on cane only , stable    Patient Active Problem List   Diagnosis Date Noted  . Stress fracture of right femur with delayed healing 04/27/2017  . Osteoarthritis, knee 07/26/2015  . Other bilateral secondary osteoarthritis of hip 07/26/2015  . Hypertension, benign 01/29/2015  . Controlled gout 01/29/2015  . X-linked hypophosphatemic rickets 01/29/2015  . GAD (generalized anxiety disorder) 01/29/2015  . Gait instability 01/29/2015  . Recurrent falls 01/29/2015  . Acquired scoliosis 01/29/2015  . History of breast cancer in female 09/08/2012  . H/O rickets 09/08/2012  . History of shingles 12/06/2011    Past Surgical History:  Procedure Laterality Date  . BREAST SURGERY Left 09/30/2011   Left simple mastectomy with sentinel node biopsy  . CATARACT EXTRACTION, BILATERAL Bilateral 04/2015   Dr. Tommy Rainwater in [place]  . COLONOSCOPY  2012  . DILATION AND CURETTAGE OF UTERUS  1982  . MASTECTOMY Left 2013   with SN biopsy  . TONSILLECTOMY  1953  . UPPER GI ENDOSCOPY  2012    Family History  Problem Relation Age of  Onset  . Lymphoma Brother 99       Hodgkin's Lymphoma  . Cancer Brother        non hodgkins lymphoma   . Heart disease Brother   . Cancer Maternal Uncle 70       cancer of the esophagus  . Alzheimer's disease Mother   . Stroke Father   . Breast cancer Neg Hx     Social History   Socioeconomic History  . Marital status: Married    Spouse name: Sonny  . Number of children: 1  . Years of education: some college  . Highest education level: 12th grade  Occupational History  . Occupation:  Retired  Scientific laboratory technician  . Financial resource strain: Not hard at all  . Food insecurity    Worry: Never true    Inability: Never true  . Transportation needs    Medical: No    Non-medical: No  Tobacco Use  . Smoking status: Former Smoker    Packs/day: 0.50    Years: 30.00    Pack years: 15.00    Types: Cigarettes    Quit date: 04/21/2006    Years since quitting: 12.8  . Smokeless tobacco: Never Used  Substance and Sexual Activity  . Alcohol use: Yes    Alcohol/week: 1.0 standard drinks    Types: 1 Glasses of wine per week  . Drug use: No  . Sexual activity: Not Currently  Lifestyle  . Physical activity    Days per week: 0 days    Minutes per session: 0 min  . Stress: Rather much  Relationships  . Social connections    Talks on phone: More than three times a week    Gets together: More than three times a week    Attends religious service: Patient refused    Active member of club or organization: Patient refused    Attends meetings of clubs or organizations: Patient refused    Relationship status: Married  . Intimate partner violence    Fear of current or ex partner: No    Emotionally abused: No    Physically abused: No    Forced sexual activity: No  Other Topics Concern  . Not on file  Social History Narrative  . Not on file     Current Outpatient Medications:  .  ALPRAZolam (XANAX) 0.5 MG tablet, Take 0.5-1 tablets (0.25-0.5 mg total) by mouth at bedtime as needed. for anxiety, Disp: 30 tablet, Rfl: 0 .  aspirin 81 MG chewable tablet, Chew 81 mg by mouth daily., Disp: , Rfl:  .  busPIRone (BUSPAR) 5 MG tablet, Take 1 tablet (5 mg total) by mouth 3 (three) times daily as needed., Disp: 270 tablet, Rfl: 0 .  cholecalciferol (VITAMIN D3) 25 MCG (1000 UT) tablet, Take 1,000 Units by mouth daily., Disp: , Rfl:  .  colchicine 0.6 MG tablet, Take 2 tablets (1.2 mg total) by mouth daily as needed. Max 1.8mg Bailey Mech, Disp: 30 tablet, Rfl: 1 .  Cyanocobalamin (B-12) 1000 MCG  SUBL, Place 1 tablet under the tongue 3 (three) times a week., Disp: 30 each, Rfl: 2 .  DULoxetine (CYMBALTA) 60 MG capsule, Take 1 capsule (60 mg total) by mouth daily., Disp: 90 capsule, Rfl: 1 .  hydrALAZINE (APRESOLINE) 25 MG tablet, Take 1 tablet (25 mg total) by mouth 3 (three) times daily. If bp above 150/90, Disp: 90 tablet, Rfl: 0 .  nebivolol (BYSTOLIC) 10 MG tablet, Take 1 tablet (10 mg total) by mouth daily.,  Disp: 90 tablet, Rfl: 0 .  olmesartan (BENICAR) 40 MG tablet, Take 1 tablet (40 mg total) by mouth daily., Disp: 90 tablet, Rfl: 0 .  omega-3 acid ethyl esters (LOVAZA) 1 g capsule, Take 1 capsule (1 g total) by mouth 2 (two) times daily., Disp: 180 capsule, Rfl: 1  Allergies  Allergen Reactions  . Allopurinol     diarrhea  . Codeine Nausea And Vomiting  . Other     Pain medications Altered mental status  . Shellfish Allergy Diarrhea and Nausea And Vomiting    Just when eating clams Just when eating clams Just when eating clams     I personally reviewed active problem list, medication list, allergies, family history, social history, health maintenance with the patient/caregiver today.   ROS  Constitutional: Negative for fever or weight change.  Respiratory: Negative for cough and shortness of breath.   Cardiovascular: Negative for chest pain or palpitations.  Gastrointestinal: Negative for abdominal pain, no bowel changes.  Musculoskeletal: Positive for gait problem or joint swelling.  Skin: Negative for rash.  Neurological: Negative for dizziness or headache.  No other specific complaints in a complete review of systems (except as listed in HPI above).  Objective  Vitals:   02/16/19 1350  BP: 114/72  Pulse: 78  Resp: 16  Temp: (!) 97.1 F (36.2 C)  TempSrc: Temporal  SpO2: 98%  Weight: 129 lb 9.6 oz (58.8 kg)  Height: 4\' 7"  (1.397 m)    Body mass index is 30.12 kg/m.  Physical Exam  Constitutional: Patient appears well-nourished. Obese  No  distress.  HEENT: head atraumatic, normocephalic, pupils equal and reactive to light Cardiovascular: Normal rate, regular rhythm and normal heart sounds.  No murmur heard. No BLE edema. Pulmonary/Chest: Effort normal and breath sounds normal. No respiratory distress. Abdominal: Soft.  There is no tenderness. Muscular Skeletal: petite , uses a cane to assist with ambulation Psychiatric: Patient has a normal mood and affect. behavior is normal. Judgment and thought content normal.   PHQ2/9: Depression screen [facility] 2/9 11/16/2018 07/19/2018 11/10/2017 10/15/2016 06/03/2016  Decreased Interest 1 1 0 0 0  Down, Depressed, Hopeless 1 1 0 0 0  PHQ - 2 Score 2 2 0 0 0  Altered sleeping 1 0 0 - -  Tired, decreased energy 1 1 0 - -  Change in appetite 0 0 0 - -  Feeling bad or failure about yourself  0 0 0 - -  Trouble concentrating 1 0 0 - -  Moving slowly or fidgety/restless 1 1 0 - -  Suicidal thoughts 0 0 0 - -  PHQ-9 Score 6 4 0 - -  Difficult doing work/chores Not difficult at all Somewhat difficult Not difficult at all - -    phq 9 is positive, under more stress with brother still very sick with infected cardiac valve, husband will have gallbladder surgery    Fall Risk: Fall Risk  11/16/2018 07/19/2018 05/19/2018 03/16/2018 11/10/2017  Falls in the past year? 1 0 0 0 Yes  Number falls in past yr: 1 0 0 - 1  Injury with Fall? 1 0 0 - Yes  Risk Factor Category  - - - - High Fall Risk  Risk for fall due to : Impaired balance/gait;History of fall(s) - - - Impaired vision;History of fall(s);Impaired balance/gait  Risk for fall due to: Comment - - - - wears eyeglasses; ambulates with cane  Follow up Falls prevention discussed - - - Falls evaluation completed;Education provided;Falls  prevention discussed     Assessment & Plan  1. Need for immunization against influenza  - Flu Vaccine QUAD High Dose(Fluad)  2. Hypertriglyceridemia  Needs to have fasting labs next visit   3. Hypertension,  benign  - olmesartan (BENICAR) 40 MG tablet; Take 1 tablet (40 mg total) by mouth daily.  Dispense: 90 tablet; Refill: 1 - nebivolol (BYSTOLIC) 10 MG tablet; Take 1 tablet (10 mg total) by mouth daily.  Dispense: 90 tablet; Refill: 1  4. X-linked hypophosphatemic rickets   5. Controlled gout  Does not want to take daily medication   6. Dyslipidemia   7. B12 deficiency  Taking supplementation   8. Gait instability  Using a cane  9. GAD (generalized anxiety disorder)  - DULoxetine (CYMBALTA) 60 MG capsule; Take 1 capsule (60 mg total) by mouth daily.  Dispense: 90 capsule; Refill: 1 - busPIRone (BUSPAR) 5 MG tablet; Take 1 tablet (5 mg total) by mouth 3 (three) times daily as needed.  Dispense: 270 tablet; Refill: 1  10. Vitamin D deficiency  Taking supplements  11. Burning feet syndrome  - DULoxetine (CYMBALTA) 60 MG capsule; Take 1 capsule (60 mg total) by mouth daily.  Dispense: 90 capsule; Refill: 1

## 2019-03-10 IMAGING — MG MM DIGITAL SCREENING UNILAT*R* W/ TOMO W/ CAD
4 series · 4 of 12 positions shown · non-contrast
Comparison: Previous exam(s).

CLINICAL DATA: Screening.

EXAM:
DIGITAL SCREENING UNILATERAL RIGHT MAMMOGRAM WITH CAD AND TOMO

[R MLO synth-2D]
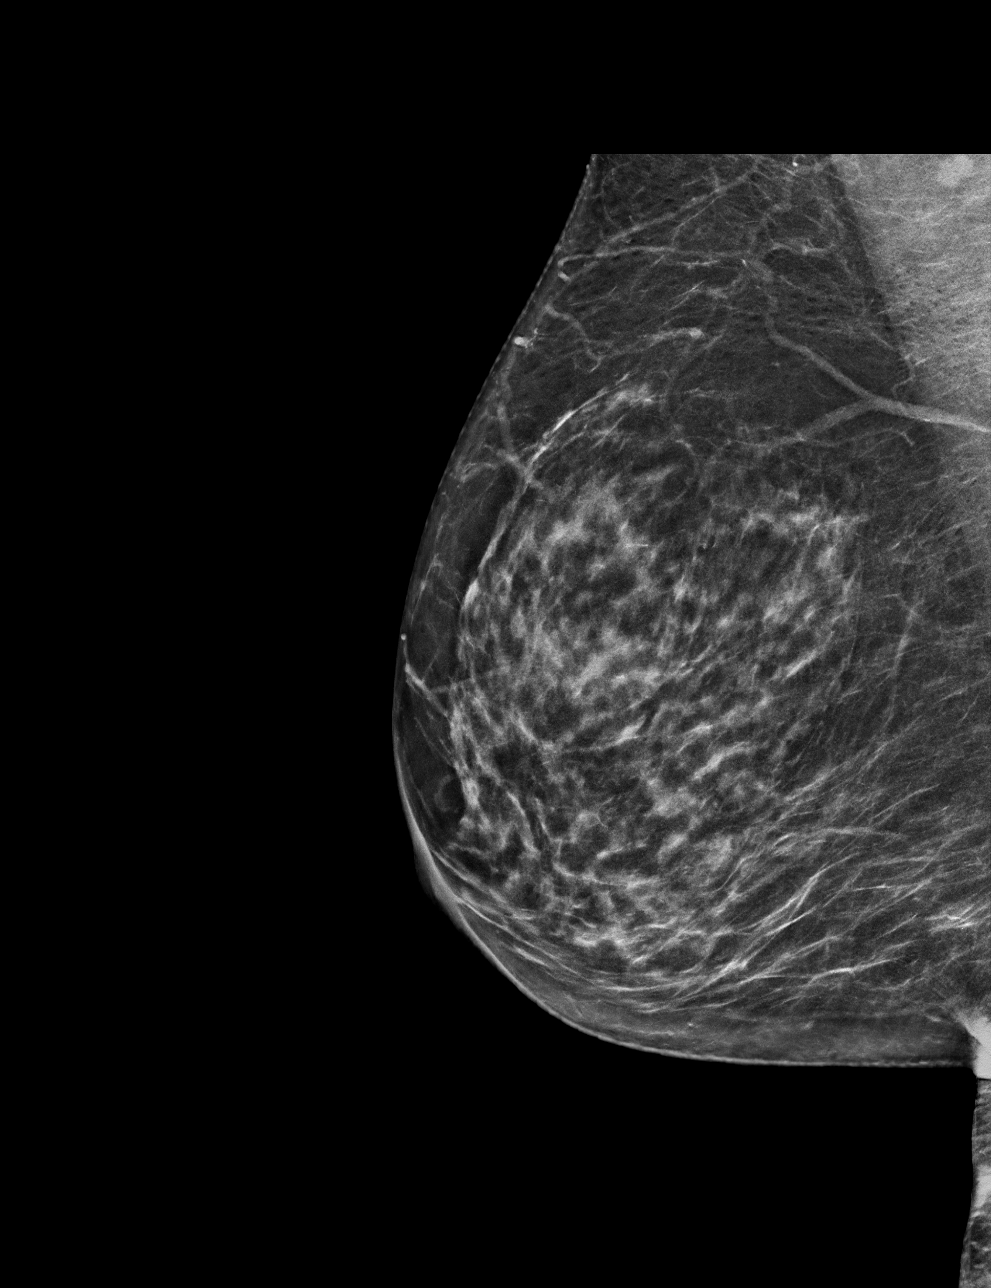

[R CC synth-2D]
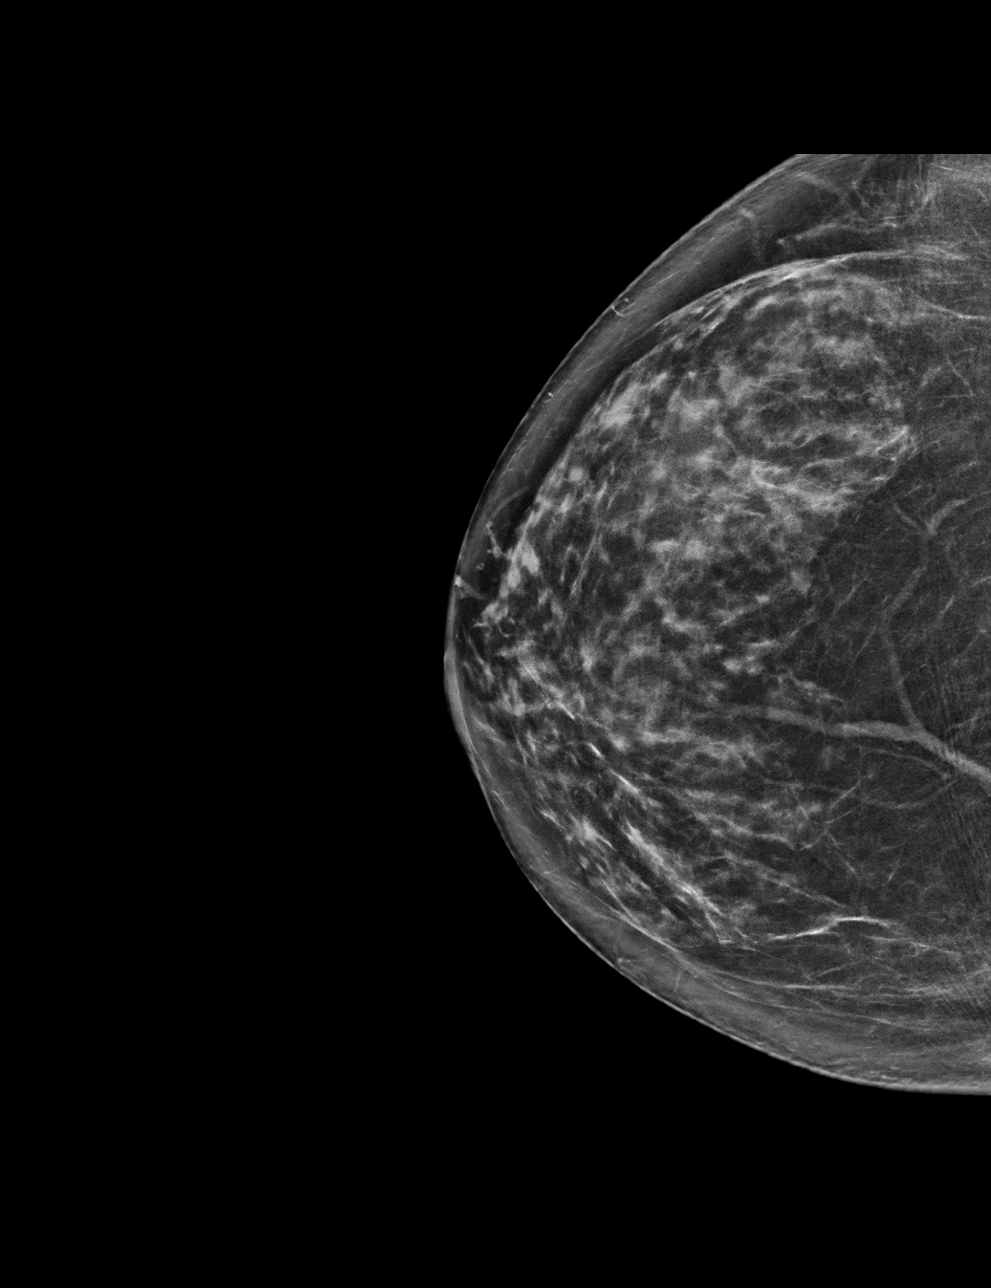

[R MLO tomo · tomo slice 32/63.0]
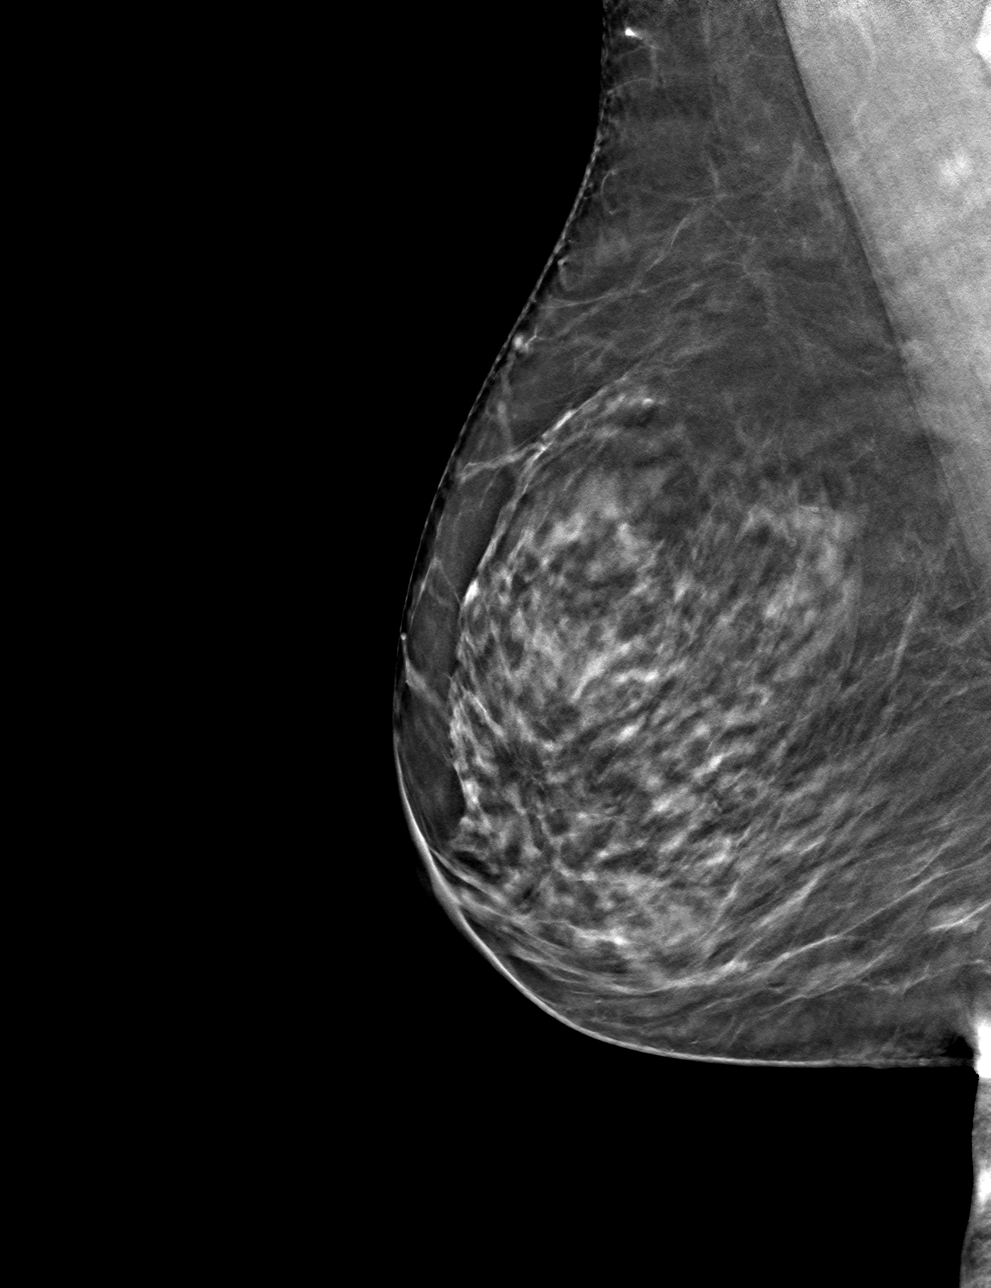

[R CC tomo · tomo slice 33/65.0]
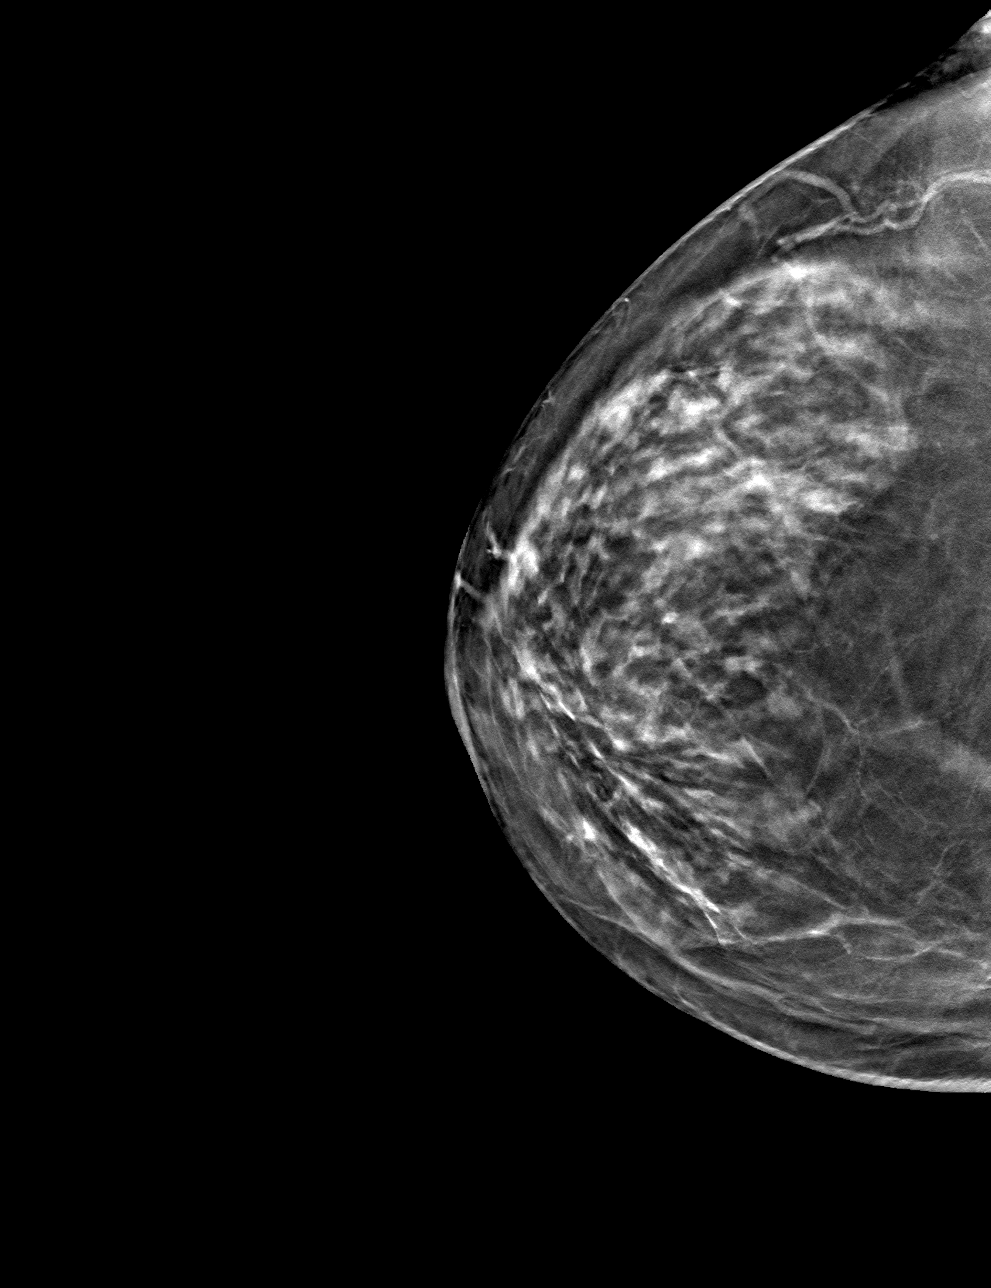

[4 of 12 positions shown; findings below may reference images not displayed]

ACR Breast Density Category c: The breast tissue is heterogeneously
dense, which may obscure small masses.
FINDINGS: There are no findings suspicious for malignancy. Images were
processed with CAD.
IMPRESSION: No mammographic evidence of malignancy. A result letter of this
screening mammogram will be mailed directly to the patient.

RECOMMENDATION:
Screening mammogram in one year. (Code:[id number]-[id number])

BI-RADS CATEGORY  1: Negative.

## 2019-05-03 ENCOUNTER — Other Ambulatory Visit: Payer: Self-pay | Admitting: Family Medicine

## 2019-05-03 DIAGNOSIS — E785 Hyperlipidemia, unspecified: Secondary | ICD-10-CM

## 2019-06-06 DIAGNOSIS — M166 Other bilateral secondary osteoarthritis of hip: Secondary | ICD-10-CM | POA: Diagnosis not present

## 2019-06-11 ENCOUNTER — Encounter (HOSPITAL_COMMUNITY): Payer: Self-pay | Admitting: Emergency Medicine

## 2019-06-11 ENCOUNTER — Other Ambulatory Visit: Payer: Self-pay

## 2019-06-11 ENCOUNTER — Emergency Department (HOSPITAL_COMMUNITY): Payer: Medicare PPO

## 2019-06-11 ENCOUNTER — Emergency Department (HOSPITAL_COMMUNITY)
Admission: EM | Admit: 2019-06-11 | Discharge: 2019-06-11 | Disposition: A | Discharge status: Home / Self Care | Payer: Medicare PPO | Attending: Emergency Medicine | Admitting: Emergency Medicine

## 2019-06-11 DIAGNOSIS — R29810 Facial weakness: Secondary | ICD-10-CM | POA: Diagnosis not present

## 2019-06-11 DIAGNOSIS — R0902 Hypoxemia: Secondary | ICD-10-CM | POA: Diagnosis not present

## 2019-06-11 DIAGNOSIS — Z87891 Personal history of nicotine dependence: Secondary | ICD-10-CM | POA: Diagnosis not present

## 2019-06-11 DIAGNOSIS — R410 Disorientation, unspecified: Secondary | ICD-10-CM | POA: Diagnosis not present

## 2019-06-11 DIAGNOSIS — I10 Essential (primary) hypertension: Secondary | ICD-10-CM

## 2019-06-11 DIAGNOSIS — Z7982 Long term (current) use of aspirin: Secondary | ICD-10-CM | POA: Insufficient documentation

## 2019-06-11 DIAGNOSIS — G459 Transient cerebral ischemic attack, unspecified: Secondary | ICD-10-CM | POA: Diagnosis not present

## 2019-06-11 DIAGNOSIS — R55 Syncope and collapse: Secondary | ICD-10-CM

## 2019-06-11 DIAGNOSIS — Z79899 Other long term (current) drug therapy: Secondary | ICD-10-CM | POA: Insufficient documentation

## 2019-06-11 DIAGNOSIS — W19XXXA Unspecified fall, initial encounter: Secondary | ICD-10-CM | POA: Diagnosis not present

## 2019-06-11 DIAGNOSIS — Z853 Personal history of malignant neoplasm of breast: Secondary | ICD-10-CM | POA: Insufficient documentation

## 2019-06-11 LAB — DIFFERENTIAL
Abs Immature Granulocytes: 0.04 10*3/uL (ref 0.00–0.07)
Basophils Absolute: 0.1 10*3/uL (ref 0.0–0.1)
Basophils Relative: 1 %
Eosinophils Absolute: 0.5 10*3/uL (ref 0.0–0.5)
Eosinophils Relative: 5 %
Immature Granulocytes: 0 %
Lymphocytes Relative: 17 %
Lymphs Abs: 1.7 10*3/uL (ref 0.7–4.0)
Monocytes Absolute: 0.6 10*3/uL (ref 0.1–1.0)
Monocytes Relative: 6 %
Neutro Abs: 7.3 10*3/uL (ref 1.7–7.7)
Neutrophils Relative %: 71 %

## 2019-06-11 LAB — CBC
HCT: 43.1 % (ref 36.0–46.0)
Hemoglobin: 12.3 g/dL (ref 12.0–15.0)
MCH: 24.1 pg — ABNORMAL LOW (ref 26.0–34.0)
MCHC: 28.5 g/dL — ABNORMAL LOW (ref 30.0–36.0)
MCV: 84.5 fL (ref 80.0–100.0)
Platelets: 269 10*3/uL (ref 150–400)
RBC: 5.1 MIL/uL (ref 3.87–5.11)
RDW: 15.8 % — ABNORMAL HIGH (ref 11.5–15.5)
WBC: 10.1 10*3/uL (ref 4.0–10.5)
nRBC: 0 % (ref 0.0–0.2)

## 2019-06-11 LAB — BASIC METABOLIC PANEL
Anion gap: 9 (ref 5–15)
BUN: 14 mg/dL (ref 8–23)
CO2: 26 mmol/L (ref 22–32)
Calcium: 8.9 mg/dL (ref 8.9–10.3)
Chloride: 105 mmol/L (ref 98–111)
Creatinine, Ser: 0.95 mg/dL (ref 0.44–1.00)
GFR calc Af Amer: >60 mL/min (ref >60)
GFR calc non Af Amer: 60 mL/min — ABNORMAL LOW (ref >60)
Glucose, Bld: 99 mg/dL (ref 70–99)
Potassium: 3.5 mmol/L (ref 3.5–5.1)
Sodium: 140 mmol/L (ref 135–145)

## 2019-06-11 LAB — APTT: aPTT: 25 seconds (ref 24–36)

## 2019-06-11 LAB — PROTIME-INR
INR: 1 (ref 0.8–1.2)
Prothrombin Time: 13 seconds (ref 11.4–15.2)

## 2019-06-11 MED ORDER — HYDRALAZINE HCL 25 MG PO TABS
25.0000 mg | ORAL_TABLET | Freq: Once | ORAL | Status: AC
  Administered 2019-06-11: 25 mg via ORAL
  Filled 2019-06-11: qty 1

## 2019-06-11 MED ORDER — SODIUM CHLORIDE 0.9% FLUSH
3.0000 mL | Freq: Once | INTRAVENOUS | Status: AC
  Administered 2019-06-11: 3 mL via INTRAVENOUS

## 2019-06-11 MED ORDER — SODIUM CHLORIDE 0.9% FLUSH: 3.0000 mL | Freq: Once | INTRAVENOUS | Status: DC

## 2019-06-11 NOTE — ED Provider Notes (Signed)
[facility] EMERGENCY DEPARTMENT Provider Note   CSN: [id number] Arrival date & time: 06/11/19  1618     History Chief Complaint  Patient presents with  . syncopal episode    Felicia Robinson is a 74 y.o. female.  Patient has a history of rickets.  Patient brought in by [facility].  Patient had an event in her bathroom while she was cleaning around the toilet.  Patient states that she did not fall she did not hit her head.  But apparently had a spell which according to her husband who has dementia may have lasted 2 minutes at most.  Per fire department patient had left-sided facial droop when EMS got there there was no facial droop arm or leg weakness.  Patient was confused a little bit to date and events and her birthday.  Patient quickly became oriented.  Upon arrival here no neuro deficits.  Patient has a history of hypertension.  Is on hypertensive meds.  She had her morning meds but has not had her other meds throughout the day upon arrival blood pressure was 136/87.  Patient describes no complaints.  She feels fine.  Felt fine yesterday felt fine earlier today.  She does have a normal aches and pains from the rickets and she is followed by [facility] for that.        Past Medical History:  Diagnosis Date  . Breast cancer ([place]) 2013   left breast  . Cancer ([place]) 2013   L breast  . Disorders of phosphorus metabolism    rickets  . Fibroadenosis of breast   . Gout 2013  . Hypertension 2009  . Malignant neoplasm of upper-outer quadrant of female breast ([place]) 09/30/2011   7 mm low-grade invasive mammary carcinoma with focal DCIS. ER 90%, PR 1%, HER-2/neu not overexpressing.  . Osteomalacia   . Shingles 2013    Patient Active Problem List   Diagnosis Date Noted  . Stress fracture of right femur with delayed healing 04/27/2017  . Osteoarthritis, knee 07/26/2015  . Other bilateral secondary osteoarthritis of hip 07/26/2015  . Hypertension, benign  01/29/2015  . Controlled gout 01/29/2015  . X-linked hypophosphatemic rickets 01/29/2015  . GAD (generalized anxiety disorder) 01/29/2015  . Gait instability 01/29/2015  . Recurrent falls 01/29/2015  . Acquired scoliosis 01/29/2015  . History of breast cancer in female 09/08/2012  . H/O rickets 09/08/2012  . History of shingles 12/06/2011    Past Surgical History:  Procedure Laterality Date  . BREAST SURGERY Left 09/30/2011   Left simple mastectomy with sentinel node biopsy  . CATARACT EXTRACTION, BILATERAL Bilateral 04/2015   Dr. Tommy Rainwater in [place]  . COLONOSCOPY  2012  . DILATION AND CURETTAGE OF UTERUS  1982  . MASTECTOMY Left 2013   with SN biopsy  . TONSILLECTOMY  1953  . UPPER GI ENDOSCOPY  2012     OB History    Gravida  1   Para  1   Term      Preterm      AB      Living  1     SAB      TAB      Ectopic      Multiple      Live Births           Obstetric Comments  Menstrual : 13        Family History  Problem Relation Age of Onset  . Lymphoma Brother 36  Hodgkin's Lymphoma  . Cancer Brother        non hodgkins lymphoma   . Heart disease Brother   . Cancer Maternal Uncle 70       cancer of the esophagus  . Alzheimer's disease Mother   . Stroke Father   . Breast cancer Neg Hx     Social History   Tobacco Use  . Smoking status: Former Smoker    Packs/day: 0.50    Years: 30.00    Pack years: 15.00    Types: Cigarettes    Quit date: 04/21/2006    Years since quitting: 13.1  . Smokeless tobacco: Never Used  Substance Use Topics  . Alcohol use: Yes    Alcohol/week: 1.0 standard drinks    Types: 1 Glasses of wine per week  . Drug use: No    Home Medications Prior to Admission medications   Medication Sig Start Date End Date Taking? Authorizing Provider  omega-3 acid ethyl esters (LOVAZA) 1 g capsule TAKE 1 CAPSULES BY MOUTH TWICE DAILY 05/03/19   Ancil Boozer, Drue Stager, MD  ALPRAZolam Duanne Moron) 0.5 MG tablet Take 0.5-1 tablets  (0.25-0.5 mg total) by mouth at bedtime as needed. for anxiety 11/16/18   Steele Sizer, MD  aspirin 81 MG chewable tablet Chew 81 mg by mouth daily.    [provider]  busPIRone (BUSPAR) 5 MG tablet Take 1 tablet (5 mg total) by mouth 3 (three) times daily as needed. 02/16/19   Steele Sizer, MD  cholecalciferol (VITAMIN D3) 25 MCG (1000 UT) tablet Take 1,000 Units by mouth daily.    [provider]  colchicine 0.6 MG tablet Take 2 tablets (1.2 mg total) by mouth daily as needed. Max 1.48m/24hrs 06/28/18   SSteele Sizer MD  Cyanocobalamin (B-12) 1000 MCG SUBL Place 1 tablet under the tongue 3 (three) times a week. 11/11/17   SSteele Sizer MD  DULoxetine (CYMBALTA) 60 MG capsule Take 1 capsule (60 mg total) by mouth daily. 02/16/19   SSteele Sizer MD  hydrALAZINE (APRESOLINE) 25 MG tablet Take 1 tablet (25 mg total) by mouth 3 (three) times daily. If bp above 150/90 01/17/19   SSteele Sizer MD  nebivolol (BYSTOLIC) 10 MG tablet Take 1 tablet (10 mg total) by mouth daily. 02/16/19   SSteele Sizer MD  olmesartan (BENICAR) 40 MG tablet Take 1 tablet (40 mg total) by mouth daily. 02/16/19   SSteele Sizer MD    Allergies    Allopurinol, Codeine, Other, and Shellfish allergy  Review of Systems   Review of Systems  Constitutional: Negative for chills and fever.  HENT: Negative for congestion, rhinorrhea and sore throat.   Eyes: Negative for visual disturbance.  Respiratory: Negative for cough and shortness of breath.   Cardiovascular: Negative for chest pain and leg swelling.  Gastrointestinal: Negative for abdominal pain, diarrhea, nausea and vomiting.  Genitourinary: Negative for dysuria.  Musculoskeletal: Negative for back pain and neck pain.  Skin: Negative for rash.  Neurological: Negative for dizziness, light-headedness and headaches.  Hematological: Does not bruise/bleed easily.  Psychiatric/Behavioral: Positive for confusion.    Physical  Exam Updated Vital Signs BP (!) 181/95   Pulse 90   Temp 98.2 F (36.8 C) (Oral)   Resp 18   SpO2 93%   Physical Exam Vitals and nursing note reviewed.  Constitutional:      General: She is not in acute distress.    Appearance: She is well-developed.  HENT:     Head: Normocephalic and atraumatic.  Eyes:     Extraocular Movements: Extraocular movements intact.     Conjunctiva/sclera: Conjunctivae normal.     Pupils: Pupils are equal, round, and reactive to light.  Cardiovascular:     Rate and Rhythm: Normal rate and regular rhythm.     Heart sounds: No murmur.  Pulmonary:     Effort: Pulmonary effort is normal. No respiratory distress.     Breath sounds: Normal breath sounds.  Abdominal:     Palpations: Abdomen is soft.     Tenderness: There is no abdominal tenderness.  Musculoskeletal:        General: No tenderness or signs of injury. Normal range of motion.     Cervical back: Normal range of motion and neck supple.     Right lower leg: No edema.     Left lower leg: No edema.  Skin:    General: Skin is warm and dry.     Capillary Refill: Capillary refill takes less than 2 seconds.  Neurological:     General: No focal deficit present.     Mental Status: She is alert and oriented to person, place, and time.     Cranial Nerves: No cranial nerve deficit.     Sensory: No sensory deficit.     Motor: No weakness.     ED Results / Procedures / Treatments   Labs (all labs ordered are listed, but only abnormal results are displayed) Labs Reviewed  BASIC METABOLIC PANEL - Abnormal; Notable for the following components:      Result Value   GFR calc non Af Amer 60 (*)    All other components within normal limits  CBC - Abnormal; Notable for the following components:   MCH 24.1 (*)    MCHC 28.5 (*)    RDW 15.8 (*)    All other components within normal limits  PROTIME-INR  APTT  DIFFERENTIAL  URINALYSIS, ROUTINE W REFLEX MICROSCOPIC    EKG EKG  Interpretation  Date/Time:  Saturday June 11 2019 16:33:51 EST Ventricular Rate:  74 PR Interval:  142 QRS Duration: 76 QT Interval:  390 QTC Calculation: 432 R Axis:   24 Text Interpretation: Normal sinus rhythm ST & T wave abnormality, consider inferolateral ischemia Abnormal ECG Artifact No significant change since last tracing Confirmed by Fredia Sorrow [phone]) on 06/11/2019 7:46:48 PM   Radiology CT HEAD WO CONTRAST  Result Date: 06/11/2019 CLINICAL DATA:  Transient ischemic attack EXAM: CT HEAD WITHOUT CONTRAST TECHNIQUE: Contiguous axial images were obtained from the base of the skull through the vertex without intravenous contrast. COMPARISON:  None. FINDINGS: Brain: There is no mass, hemorrhage or extra-axial collection. The size and configuration of the ventricles and extra-axial CSF spaces are normal. There is hypoattenuation of the white matter, most commonly indicating chronic small vessel disease. Vascular: No abnormal hyperdensity of the major intracranial arteries or dural venous sinuses. No intracranial atherosclerosis. Skull: The visualized skull base, calvarium and extracranial soft tissues are normal. Sinuses/Orbits: No fluid levels or advanced mucosal thickening of the visualized paranasal sinuses. No mastoid or middle ear effusion. The orbits are normal. IMPRESSION: Chronic small vessel disease without acute intracranial abnormality. Electronically Signed   By: Ulyses Jarred M.D.   On: 06/11/2019 18:05    Procedures Procedures (including critical care time)  Medications Ordered in ED Medications  sodium chloride flush (NS) 0.9 % injection 3 mL (0 mLs Intravenous Hold 06/11/19 1701)  sodium chloride flush (NS) 0.9 % injection 3 mL (3 mLs Intravenous Given  06/11/19 1701)  hydrALAZINE (APRESOLINE) tablet 25 mg (25 mg Oral Given 06/11/19 2017)    ED Course  I have reviewed the triage vital signs and the nursing notes.  Pertinent labs & imaging results that were  available during my care of the patient were reviewed by me and considered in my medical decision making (see chart for details).    MDM Rules/Calculators/A&P                      Patient upon arrival here without any complaints or any significant findings.  Sounds as if patient did not have a syncopal episode since she did not truly pass out and did not fall.  Patient did have a period of some confusion.  Fire department reported left facial droop but EMS reported no droop and no focal findings.  Reported some confusion.  Work-up here without any significant abnormalities other than hypertension.  Patient's blood pressure has increased to like 170/97.  Patient without headache without chest pain without any focal neuro deficits no strokelike symptoms now.  No shortness of breath.  No congestion no flulike illness.  Oxygen saturation here is 96% on room air.  Patient with elevated blood pressure given her dose of hydralazine that she normally takes three times a day.  Will have patient keep a blood pressure log and follow back up with her primary care doctor she feels as if it has been running high a little bit lately.  Cardiac monitoring here without any acute abnormalities EKG without any acute changes.  It was sinus rhythm.  Patient wants to go home.  Feel patient probably stable for home.  Not clear exactly what occurred at home appears not to be a syncopal episode.  Possibly could have been a TIA.  Head CT here negative.  Patient completely asymptomatic currently.  Feel that probably was not a stroke event.  The only thing suggestive that would have been the facial droop.  Is not clear whether that was truly present.  Patient's husband has some dementia.  Her son was nearby.  Did apparently notice any of the facial droop.  We will discharge patient home keeping a blood pressure log.  Following up with her primary care doctor and returning for any new or worse symptoms.      Final Clinical  Impression(s) / ED Diagnoses Final diagnoses:  Near syncope  Essential hypertension    Rx / DC Orders ED Discharge Orders    None       Fredia Sorrow, MD 06/11/19 2029

## 2019-06-11 NOTE — ED Notes (Signed)
Patient verbalizes understanding of discharge instructions. Opportunity for questioning and answers were provided. Armband removed by staff, pt discharged from ED to home with son 

## 2019-06-11 NOTE — ED Notes (Addendum)
Pt able to ambulate with walker to restroom without assistance. Uses a cane and walker at home. Urine collection contaminated by stool, will attempt recollect

## 2019-06-11 NOTE — ED Triage Notes (Addendum)
Pt to triage via [facility].  They were called out for fall in bathroom.  Pt was sitting on walker seat when they arrived and fire had assisted her up from bathroom floor.  Pt denies falling and states that she is unsure what happened but remembers feeling dizzy prior to event.  Denies injury/pain.  Per fire dept, pt had L sided facial droop.  EMS reports no facial droop or arm/leg weakness on their arrival but that pt was confused to date, events, and her birthday.  Pt alert and oriented at this time with no neuro deficits noted on triage exam.

## 2019-06-11 NOTE — ED Notes (Signed)
Pt son contacted, updated triage history:pt was leaned over the commode when found by husband, unknown if she had fallen. Son reports that when he arrived, the pt was awake but not responding verbally, appeared not to be able to see him. After several minutes began to respond but was disoriented to situation and time.   At this time, pt denies pain to extremities or head. See flow sheets for [facility] (0)

## 2019-06-11 NOTE — Discharge Instructions (Addendum)
Start a daily blood pressure log.  Check it each day somewhere around 2:00 in the afternoon.  And then follow-up with your primary care doctor.  Take your blood pressure medicine as prescribed.  Return for any new or worse symptoms similar to what happened today.  Return for any true passing out.  Return for headache any strokelike symptoms any shortness of breath any chest pain.

## 2019-06-14 ENCOUNTER — Other Ambulatory Visit: Payer: Self-pay

## 2019-06-14 ENCOUNTER — Encounter: Payer: Self-pay | Admitting: Family Medicine

## 2019-06-14 ENCOUNTER — Ambulatory Visit: Payer: Medicare Other | Admitting: Family Medicine

## 2019-06-14 VITALS — BP 120/70 | HR 71 | Temp 97.6°F | Resp 16 | Ht <= 58 in | Wt 130.0 lb

## 2019-06-14 DIAGNOSIS — Z8673 Personal history of transient ischemic attack (TIA), and cerebral infarction without residual deficits: Secondary | ICD-10-CM | POA: Diagnosis not present

## 2019-06-14 DIAGNOSIS — Z87898 Personal history of other specified conditions: Secondary | ICD-10-CM

## 2019-06-14 DIAGNOSIS — E785 Hyperlipidemia, unspecified: Secondary | ICD-10-CM | POA: Diagnosis not present

## 2019-06-14 MED ORDER — ROSUVASTATIN CALCIUM 5 MG PO TABS: 5.0000 mg | ORAL_TABLET | Freq: Every day | ORAL | 0 refills | Status: DC

## 2019-06-14 NOTE — Patient Instructions (Signed)
Vaso-vagal response

## 2019-06-14 NOTE — Progress Notes (Signed)
Name: Felicia Robinson   MRN: [id number]    DOB: 1945/10/14   Date:06/14/2019       Progress Note  Subjective  Chief Complaint  Chief Complaint  Patient presents with  . ER Follow Up  . Cerebrovascular Accident    HPI  EC follow up : she was cleaning her bathroom, she was on all fours, she states she felt weird called her husband that was in another room but the time he arrived she was laying on the floor and he called their son and called 15. She recalls hearing some voices and waking up in the ambulance. She denies bowel or bladder incontinence. While reviewing her records from [facility] I told her that fire department observed a facial droop but not EMS, also normal neuro when arrived to [facility] with normal bp 136/87.  She was able to answer to their questions , follow commands. Labs were normal negative CT, she states felt very drained and tired about 48 hours after the incident but feeling back to normal now. She is wiling to start statin therapy, we will ask her to come back in about one month to recheck levels  Dysthymia: she has been frustrated with COVID also she is always in pain and gets frustrated about not being able to do as much as she used to do, she is going to see Endocrinologist and is getting ready to discuss going back on rickets medications  Patient Active Problem List   Diagnosis Date Noted  . Stress fracture of right femur with delayed healing 04/27/2017  . Osteoarthritis, knee 07/26/2015  . Other bilateral secondary osteoarthritis of hip 07/26/2015  . Hypertension, benign 01/29/2015  . Controlled gout 01/29/2015  . X-linked hypophosphatemic rickets 01/29/2015  . GAD (generalized anxiety disorder) 01/29/2015  . Gait instability 01/29/2015  . Recurrent falls 01/29/2015  . Acquired scoliosis 01/29/2015  . History of breast cancer in female 09/08/2012  . H/O rickets 09/08/2012  . History of shingles 12/06/2011    Past Surgical History:  Procedure Laterality Date  . BREAST  SURGERY Left 09/30/2011   Left simple mastectomy with sentinel node biopsy  . CATARACT EXTRACTION, BILATERAL Bilateral 04/2015   Dr. Tommy Rainwater in [place]  . COLONOSCOPY  2012  . DILATION AND CURETTAGE OF UTERUS  1982  . MASTECTOMY Left 2013   with SN biopsy  . TONSILLECTOMY  1953  . UPPER GI ENDOSCOPY  2012    Family History  Problem Relation Age of Onset  . Lymphoma Brother 23       Hodgkin's Lymphoma  . Cancer Brother        non hodgkins lymphoma   . Heart disease Brother   . Cancer Maternal Uncle 70       cancer of the esophagus  . Alzheimer's disease Mother   . Stroke Father   . Breast cancer Neg Hx     Social History   Tobacco Use  . Smoking status: Former Smoker    Packs/day: 0.50    Years: 30.00    Pack years: 15.00    Types: Cigarettes    Quit date: 04/21/2006    Years since quitting: 13.1  . Smokeless tobacco: Never Used  Substance Use Topics  . Alcohol use: Yes    Alcohol/week: 1.0 standard drinks    Types: 1 Glasses of wine per week  . Drug use: No     Current Outpatient Medications:  .  ALPRAZolam (XANAX) 0.5 MG tablet, Take 0.5-1 tablets (0.25-0.5 mg  total) by mouth at bedtime as needed. for anxiety, Disp: 30 tablet, Rfl: 0 .  aspirin 81 MG chewable tablet, Chew 81 mg by mouth daily., Disp: , Rfl:  .  busPIRone (BUSPAR) 5 MG tablet, Take 1 tablet (5 mg total) by mouth 3 (three) times daily as needed., Disp: 270 tablet, Rfl: 1 .  cholecalciferol (VITAMIN D3) 25 MCG (1000 UT) tablet, Take 1,000 Units by mouth daily., Disp: , Rfl:  .  colchicine 0.6 MG tablet, Take 2 tablets (1.2 mg total) by mouth daily as needed. Max 1.8mg /24hrs, Disp: 30 tablet, Rfl: 1 .  Cyanocobalamin (B-12) 1000 MCG SUBL, Place 1 tablet under the tongue 3 (three) times a week., Disp: 30 each, Rfl: 2 .  DULoxetine (CYMBALTA) 60 MG capsule, Take 1 capsule (60 mg total) by mouth daily., Disp: 90 capsule, Rfl: 1 .  hydrALAZINE (APRESOLINE) 25 MG tablet, Take 1 tablet (25 mg total) by  mouth 3 (three) times daily. If bp above 150/90, Disp: 90 tablet, Rfl: 0 .  nebivolol (BYSTOLIC) 10 MG tablet, Take 1 tablet (10 mg total) by mouth daily., Disp: 90 tablet, Rfl: 1 .  olmesartan (BENICAR) 40 MG tablet, Take 1 tablet (40 mg total) by mouth daily., Disp: 90 tablet, Rfl: 1 .  omega-3 acid ethyl esters (LOVAZA) 1 g capsule, TAKE 1 CAPSULES BY MOUTH TWICE DAILY, Disp: 180 capsule, Rfl: 1 .  rosuvastatin (CRESTOR) 5 MG tablet, Take 1 tablet (5 mg total) by mouth daily., Disp: 90 tablet, Rfl: 0  Allergies  Allergen Reactions  . Allopurinol     diarrhea  . Codeine Nausea And Vomiting  . Other     Pain medications Altered mental status  . Shellfish Allergy Diarrhea and Nausea And Vomiting    Just when eating clams Just when eating clams Just when eating clams     I personally reviewed active problem list, medication list, allergies, family history, social history, health maintenance with the patient/caregiver today.   ROS  Constitutional: Negative for fever or weight change.  Respiratory: Negative for cough and shortness of breath.   Cardiovascular: Negative for chest pain or palpitations.  Gastrointestinal: Negative for abdominal pain, no bowel changes.  Musculoskeletal: Positive  for gait problem and  joint swelling.  Skin: Negative for rash.  Neurological: Negative for dizziness or headache.  No other specific complaints in a complete review of systems (except as listed in HPI above).  Objective  Vitals:   06/14/19 1140  BP: 120/70  Pulse: 71  Resp: 16  Temp: 97.6 F (36.4 C)  TempSrc: Temporal  SpO2: 98%  Weight: 130 lb (59 kg)  Height: 4\' 7"  (1.397 m)    Body mass index is 30.21 kg/m.  Physical Exam  Constitutional: Patient appears well-developed and well-nourished. Obese  No distress.  HEENT: head atraumatic, normocephalic, pupils equal and reactive to light Cardiovascular: Normal rate, regular rhythm and normal heart sounds.  No murmur heard. No  BLE edema. Pulmonary/Chest: Effort normal and breath sounds normal. No respiratory distress. Abdominal: Soft.  There is no tenderness. Psychiatric: Patient has a normal mood and affect. behavior is normal. Judgment and thought content normal.   Recent Results (from the past 2160 hour(s))  Basic metabolic panel     Status: Abnormal   Collection Time: 06/11/19  4:31 PM  Result Value Ref Range   Sodium 140 135 - 145 mmol/L   Potassium 3.5 3.5 - 5.1 mmol/L   Chloride 105 98 - 111 mmol/L   CO2 26  22 - 32 mmol/L   Glucose, Bld 99 70 - 99 mg/dL   BUN 14 8 - 23 mg/dL   Creatinine, Ser 0.95 0.44 - 1.00 mg/dL   Calcium 8.9 8.9 - 10.3 mg/dL   GFR calc non Af Amer 60 (L) >60 mL/min   GFR calc Af Amer >60 >60 mL/min   Anion gap 9 5 - 15    Comment: Performed at [facility] [address], [place], [place] [address]  CBC     Status: Abnormal   Collection Time: 06/11/19  4:31 PM  Result Value Ref Range   WBC 10.1 4.0 - 10.5 K/uL   RBC 5.10 3.87 - 5.11 MIL/uL   Hemoglobin 12.3 12.0 - 15.0 g/dL   HCT 43.1 36.0 - 46.0 %   MCV 84.5 80.0 - 100.0 fL   MCH 24.1 (L) 26.0 - 34.0 pg   MCHC 28.5 (L) 30.0 - 36.0 g/dL   RDW 15.8 (H) 11.5 - 15.5 %   Platelets 269 150 - 400 K/uL   nRBC 0.0 0.0 - 0.2 %    Comment: Performed at [facility] [address] [place], [place] [address]  Differential     Status: None   Collection Time: 06/11/19  4:31 PM  Result Value Ref Range   Neutrophils Relative % 71 %   Neutro Abs 7.3 1.7 - 7.7 K/uL   Lymphocytes Relative 17 %   Lymphs Abs 1.7 0.7 - 4.0 K/uL   Monocytes Relative 6 %   Monocytes Absolute 0.6 0.1 - 1.0 K/uL   Eosinophils Relative 5 %   Eosinophils Absolute 0.5 0.0 - 0.5 K/uL   Basophils Relative 1 %   Basophils Absolute 0.1 0.0 - 0.1 K/uL   Immature Granulocytes 0 %   Abs Immature Granulocytes 0.04 0.00 - 0.07 K/uL    Comment: Performed at [facility] [address], [place], [place] [address]  Protime-INR     Status: None    Collection Time: 06/11/19  4:50 PM  Result Value Ref Range   Prothrombin Time 13.0 11.4 - 15.2 seconds   INR 1.0 0.8 - 1.2    Comment: (NOTE) INR goal varies based on device and disease states. Performed at [facility], [place] [address], [place], [place] [address]   APTT     Status: None   Collection Time: 06/11/19  4:50 PM  Result Value Ref Range   aPTT 25 24 - 36 seconds    Comment: Performed at [facility] [address] [place],  [address]     PHQ2/9: Depression screen [facility] 2/9 06/14/2019 02/16/2019 11/16/2018 07/19/2018 11/10/2017  Decreased Interest 1 1 1 1  0  Down, Depressed, Hopeless 1 1 1 1  0  PHQ - 2 Score 2 2 2 2  0  Altered sleeping 0 0 1 0 0  Tired, decreased energy 1 1 1 1  0  Change in appetite 0 0 0 0 0  Feeling bad or failure about yourself  1 0 0 0 0  Trouble concentrating 0 0 1 0 0  Moving slowly or fidgety/restless 0 1 1 1  0  Suicidal thoughts - 0 0 0 0  PHQ-9 Score 4 4 6 4  0  Difficult doing work/chores Somewhat difficult Somewhat difficult Not difficult at all Somewhat difficult Not difficult at all    phq 9 is positive   Fall Risk: Fall Risk  06/14/2019 02/16/2019 11/16/2018 07/19/2018 05/19/2018  Falls in  the past year? 1 0 1 0 0  Number falls in past yr: 1 0 1 0 0  Injury with Fall? 1 0 1 0 0  Risk Factor Category  - - - - -  Risk for fall due to : Impaired balance/gait - Impaired balance/gait;History of fall(s) - -  Risk for fall due to: Comment - - - - -  Follow up - - Falls prevention discussed - -     Functional Status Survey: Is the patient deaf or have difficulty hearing?: No Does the patient have difficulty seeing, even when wearing glasses/contacts?: No Does the patient have difficulty concentrating, remembering, or making decisions?: No Does the patient have difficulty walking or climbing stairs?: Yes Does the patient have difficulty dressing or bathing?: No Does the patient have difficulty doing errands alone such as  visiting a doctor's office or shopping?: No    Assessment & Plan  1. History of syncope  It may have bene vaso-vagal syndrome   2. History of TIA (transient ischemic attack)  - COMPLETE METABOLIC PANEL WITH GFR - rosuvastatin (CRESTOR) 5 MG tablet; Take 1 tablet (5 mg total) by mouth daily.  Dispense: 90 tablet; Refill: 0  3. Dyslipidemia  - Lipid panel - COMPLETE METABOLIC PANEL WITH GFR - rosuvastatin (CRESTOR) 5 MG tablet; Take 1 tablet (5 mg total) by mouth daily.  Dispense: 90 tablet; Refill: 0 There are no diagnoses linked to this encounter.

## 2019-06-15 DIAGNOSIS — M84351G Stress fracture, right femur, subsequent encounter for fracture with delayed healing: Secondary | ICD-10-CM | POA: Diagnosis not present

## 2019-08-15 ENCOUNTER — Telehealth: Payer: Self-pay

## 2019-08-15 NOTE — Telephone Encounter (Signed)
Copied from [place] [phone]. Topic: General - Other >> Aug 12, 2019  2:11 PM Celene Kras wrote: Reason for CRM: Pt called stating that she had her husband drop the paperwork for her handicap placard. Pt states that she needs it by next Wednesday. Please advise.

## 2019-08-17 ENCOUNTER — Encounter: Payer: Self-pay | Admitting: Family Medicine

## 2019-08-17 ENCOUNTER — Ambulatory Visit: Payer: Medicare Other | Admitting: Family Medicine

## 2019-08-17 ENCOUNTER — Other Ambulatory Visit: Payer: Self-pay

## 2019-08-17 VITALS — BP 154/86 | HR 76 | Temp 97.6°F | Resp 16 | Ht <= 58 in | Wt 125.1 lb

## 2019-08-17 DIAGNOSIS — Z1211 Encounter for screening for malignant neoplasm of colon: Secondary | ICD-10-CM | POA: Diagnosis not present

## 2019-08-17 DIAGNOSIS — Z8673 Personal history of transient ischemic attack (TIA), and cerebral infarction without residual deficits: Secondary | ICD-10-CM | POA: Diagnosis not present

## 2019-08-17 DIAGNOSIS — E785 Hyperlipidemia, unspecified: Secondary | ICD-10-CM

## 2019-08-17 DIAGNOSIS — F411 Generalized anxiety disorder: Secondary | ICD-10-CM | POA: Diagnosis not present

## 2019-08-17 DIAGNOSIS — I10 Essential (primary) hypertension: Secondary | ICD-10-CM | POA: Diagnosis not present

## 2019-08-17 DIAGNOSIS — E539 Vitamin B deficiency, unspecified: Secondary | ICD-10-CM

## 2019-08-17 LAB — LIPID PANEL
Cholesterol: 163 mg/dL (ref <200)
HDL: 52 mg/dL (ref >=50)
LDL Cholesterol (Calc): 87 mg/dL (calc)
Non-HDL Cholesterol (Calc): 111 mg/dL (calc) (ref <130)
Total CHOL/HDL Ratio: 3.1 (calc) (ref <5.0)
Triglycerides: 141 mg/dL (ref <150)

## 2019-08-17 LAB — COMPLETE METABOLIC PANEL WITH GFR
AG Ratio: 1.6 (calc) (ref 1.0–2.5)
ALT: 15 U/L (ref 6–29)
AST: 19 U/L (ref 10–35)
Albumin: 4.3 g/dL (ref 3.6–5.1)
Alkaline phosphatase (APISO): 214 U/L — ABNORMAL HIGH (ref 37–153)
BUN/Creatinine Ratio: 19 (calc) (ref 6–22)
BUN: 19 mg/dL (ref 7–25)
CO2: 27 mmol/L (ref 20–32)
Calcium: 9.2 mg/dL (ref 8.6–10.4)
Chloride: 104 mmol/L (ref 98–110)
Creat: 1 mg/dL — ABNORMAL HIGH (ref 0.60–0.93)
GFR, Est African American: 65 mL/min/{1.73_m2} (ref >=60)
GFR, Est Non African American: 56 mL/min/{1.73_m2} — ABNORMAL LOW (ref >=60)
Globulin: 2.7 g/dL (calc) (ref 1.9–3.7)
Glucose, Bld: 90 mg/dL (ref 65–99)
Potassium: 4.6 mmol/L (ref 3.5–5.3)
Sodium: 138 mmol/L (ref 135–146)
Total Bilirubin: 0.5 mg/dL (ref 0.2–1.2)
Total Protein: 7 g/dL (ref 6.1–8.1)

## 2019-08-17 MED ORDER — BUSPIRONE HCL 5 MG PO TABS: 5.0000 mg | ORAL_TABLET | Freq: Three times a day (TID) | ORAL | 1 refills | Status: DC | PRN

## 2019-08-17 MED ORDER — ROSUVASTATIN CALCIUM 5 MG PO TABS: 5.0000 mg | ORAL_TABLET | Freq: Every day | ORAL | 1 refills | Status: DC

## 2019-08-17 MED ORDER — OLMESARTAN MEDOXOMIL 40 MG PO TABS: 40.0000 mg | ORAL_TABLET | Freq: Every day | ORAL | 1 refills | Status: DC

## 2019-08-17 MED ORDER — DULOXETINE HCL 60 MG PO CPEP: 60.0000 mg | ORAL_CAPSULE | Freq: Every day | ORAL | 1 refills | Status: DC

## 2019-08-17 MED ORDER — NEBIVOLOL HCL 20 MG PO TABS: 20.0000 mg | ORAL_TABLET | Freq: Every day | ORAL | 1 refills | Status: DC

## 2019-08-17 NOTE — Progress Notes (Signed)
Name: Felicia Robinson   MRN: [id number]    DOB: 11-10-1945   Date:08/17/2019       Progress Note  Subjective  Chief Complaint  Chief Complaint  Patient presents with  . Medication Refill  . Hypertension  . GAD  . Dyslipidemia  . X-linked hypophosphatemia  . Gout  . Gastroesophageal Reflux    HPI   HTN: she has hypertension, bp at homehas been higher than usual at home.  She is taking Benicar 40 mg and Bystolic 10 mg, only taking Hydralazine 25 prn only . She states she is constantly in pain and is looking forward to getting on Cryvista again, so she can more active . Dr. Audelia Hives gave her diclofenac for pain, but not taking it daliy.   [id number] on Duloxetine Nov 2018,taking 60 mg daily ,we added buspar back in Nov2019 but only taking it prnBrother is okay now, getting ready to sale their beach home  Husband cannot drive because of loss of vision and she is worried he is developing dementia. She has been a little depressed lately, she feels the pain is worse   Dyslipidemia: she is on Lovaza, no side effects at this time  X-linked hypophosphatemia: she is seeing Dr. Delane Ginger, andtookCryvistaand was doing well on medication, however medication was stopped last Fall 2019 after stress fractures had healed. She states having more joint pains, she states since she stopped medication she has been using a cane, having difficulty driving because of the pain, but is getting ready to get re-started on Cryvista .   Gout: She has not been taking colcrys prn, she had to stop Allopurinol because it caused diarrhea.Reviewed last level and explained elevated she does not want to try any other medications at this time, last flare over one year ago   Breast Cancer left side: dx in 2013, she finished Tamoxifen,completed5 years Spring 2018. She still follows up with Dr. Fleet Contras.Last mammogram07/2020. She will contact Dr. Fleet Contras for follow up  Burning feet : she has intermittent  burning sensation on both of her feet, not daily, discussed options, gabapentin, lyrica and duloxetine.She has been usingaspar cream with lidocaineandalso taking duloxetine and is stable, doing better.   History of stress fracture of femurs and right hip, seen by Dr. Rudene Christians this past winter after a fall.    Patient Active Problem List   Diagnosis Date Noted  . Stress fracture of right femur with delayed healing 04/27/2017  . Osteoarthritis, knee 07/26/2015  . Other bilateral secondary osteoarthritis of hip 07/26/2015  . Hypertension, benign 01/29/2015  . Controlled gout 01/29/2015  . X-linked hypophosphatemic rickets 01/29/2015  . GAD (generalized anxiety disorder) 01/29/2015  . Gait instability 01/29/2015  . Recurrent falls 01/29/2015  . Acquired scoliosis 01/29/2015  . History of breast cancer in female 09/08/2012  . H/O rickets 09/08/2012  . History of shingles 12/06/2011    Past Surgical History:  Procedure Laterality Date  . BREAST SURGERY Left 09/30/2011   Left simple mastectomy with sentinel node biopsy  . CATARACT EXTRACTION, BILATERAL Bilateral 04/2015   Dr. Tommy Rainwater in [place]  . COLONOSCOPY  2012  . DILATION AND CURETTAGE OF UTERUS  1982  . MASTECTOMY Left 2013   with SN biopsy  . TONSILLECTOMY  1953  . UPPER GI ENDOSCOPY  2012    Family History  Problem Relation Age of Onset  . Lymphoma Brother 51       Hodgkin's Lymphoma  . Cancer Brother  non hodgkins lymphoma   . Heart disease Brother   . Cancer Maternal Uncle 70       cancer of the esophagus  . Alzheimer's disease Mother   . Stroke Father   . Breast cancer Neg Hx     Social History   Tobacco Use  . Smoking status: Former Smoker    Packs/day: 0.50    Years: 30.00    Pack years: 15.00    Types: Cigarettes    Quit date: 04/21/2006    Years since quitting: 13.3  . Smokeless tobacco: Never Used  Substance Use Topics  . Alcohol use: Yes    Alcohol/week: 1.0 standard drinks    Types: 1  Glasses of wine per week     Current Outpatient Medications:  .  aspirin 81 MG chewable tablet, Chew 81 mg by mouth daily., Disp: , Rfl:  .  busPIRone (BUSPAR) 5 MG tablet, Take 1 tablet (5 mg total) by mouth 3 (three) times daily as needed., Disp: 270 tablet, Rfl: 1 .  cholecalciferol (VITAMIN D3) 25 MCG (1000 UT) tablet, Take 1,000 Units by mouth daily., Disp: , Rfl:  .  colchicine 0.6 MG tablet, Take 2 tablets (1.2 mg total) by mouth daily as needed. Max 1.8mg Bailey Mech, Disp: 30 tablet, Rfl: 1 .  Cyanocobalamin (B-12) 1000 MCG SUBL, Place 1 tablet under the tongue 3 (three) times a week., Disp: 30 each, Rfl: 2 .  DULoxetine (CYMBALTA) 60 MG capsule, Take 1 capsule (60 mg total) by mouth daily., Disp: 90 capsule, Rfl: 1 .  hydrALAZINE (APRESOLINE) 25 MG tablet, Take 1 tablet (25 mg total) by mouth 3 (three) times daily. If bp above 150/90, Disp: 90 tablet, Rfl: 0 .  nebivolol (BYSTOLIC) 10 MG tablet, Take 1 tablet (10 mg total) by mouth daily., Disp: 90 tablet, Rfl: 1 .  olmesartan (BENICAR) 40 MG tablet, Take 1 tablet (40 mg total) by mouth daily., Disp: 90 tablet, Rfl: 1 .  omega-3 acid ethyl esters (LOVAZA) 1 g capsule, TAKE 1 CAPSULES BY MOUTH TWICE DAILY, Disp: 180 capsule, Rfl: 1 .  rosuvastatin (CRESTOR) 5 MG tablet, Take 1 tablet (5 mg total) by mouth daily., Disp: 90 tablet, Rfl: 0 .  ALPRAZolam (XANAX) 0.5 MG tablet, Take 0.5-1 tablets (0.25-0.5 mg total) by mouth at bedtime as needed. for anxiety (Patient not taking: Reported on 08/17/2019), Disp: 30 tablet, Rfl: 0 .  Burosumab-twza (CRYSVITA) 30 MG/ML SOLN, Inject into the muscle., Disp: , Rfl:  .  diclofenac (VOLTAREN) 50 MG EC tablet, Take by mouth., Disp: , Rfl:   Allergies  Allergen Reactions  . Allopurinol     diarrhea  . Codeine Nausea And Vomiting  . Other     Pain medications Altered mental status  . Shellfish Allergy Diarrhea and Nausea And Vomiting    Just when eating clams Just when eating clams Just when eating  clams     I personally reviewed active problem list, medication list, allergies, family history, social history, health maintenance with the patient/caregiver today.   ROS  Constitutional: Negative for fever or weight change.  Respiratory: Negative for cough and shortness of breath.   Cardiovascular: Negative for chest pain or palpitations.  Gastrointestinal: Negative for abdominal pain, no bowel changes.  Musculoskeletal: Positive  for gait problem but no  joint swelling.  Skin: Negative for rash.  Neurological: Negative for dizziness or headache.  No other specific complaints in a complete review of systems (except as listed in HPI above).  Objective  Vitals:   08/17/19 0958  BP: (!) 154/86  Pulse: 76  Resp: 16  Temp: 97.6 F (36.4 C)  TempSrc: Temporal  SpO2: 95%  Weight: 125 lb 1.6 oz (56.7 kg)  Height: 4\' 7"  (1.397 m)    Body mass index is 29.08 kg/m.  Physical Exam  Constitutional: Patient appears well-developed and well-nourished.  No distress.  HEENT: head atraumatic, normocephalic, pupils equal and reactive to light Cardiovascular: Normal rate, regular rhythm and normal heart sounds.  No murmur heard. No BLE edema. Pulmonary/Chest: Effort normal and breath sounds normal. No respiratory distress. Abdominal: Soft.  There is no tenderness. Muscular skeletal: leg length discrepancy, using a cane  Psychiatric: Patient has a normal mood and affect. behavior is normal. Judgment and thought content normal.  Recent Results (from the past 2160 hour(s))  Basic metabolic panel     Status: Abnormal   Collection Time: 06/11/19  4:31 PM  Result Value Ref Range   Sodium 140 135 - 145 mmol/L   Potassium 3.5 3.5 - 5.1 mmol/L   Chloride 105 98 - 111 mmol/L   CO2 26 22 - 32 mmol/L   Glucose, Bld 99 70 - 99 mg/dL   BUN 14 8 - 23 mg/dL   Creatinine, Ser 0.95 0.44 - 1.00 mg/dL   Calcium 8.9 8.9 - 10.3 mg/dL   GFR calc non Af Amer 60 (L) >60 mL/min   GFR calc Af Amer >60  >60 mL/min   Anion gap 9 5 - 15    Comment: Performed at [facility] [address], [place], [place] [address]  CBC     Status: Abnormal   Collection Time: 06/11/19  4:31 PM  Result Value Ref Range   WBC 10.1 4.0 - 10.5 K/uL   RBC 5.10 3.87 - 5.11 MIL/uL   Hemoglobin 12.3 12.0 - 15.0 g/dL   HCT 43.1 36.0 - 46.0 %   MCV 84.5 80.0 - 100.0 fL   MCH 24.1 (L) 26.0 - 34.0 pg   MCHC 28.5 (L) 30.0 - 36.0 g/dL   RDW 15.8 (H) 11.5 - 15.5 %   Platelets 269 150 - 400 K/uL   nRBC 0.0 0.0 - 0.2 %    Comment: Performed at [facility] [address], [place], [place] [address]  Differential     Status: None   Collection Time: 06/11/19  4:31 PM  Result Value Ref Range   Neutrophils Relative % 71 %   Neutro Abs 7.3 1.7 - 7.7 K/uL   Lymphocytes Relative 17 %   Lymphs Abs 1.7 0.7 - 4.0 K/uL   Monocytes Relative 6 %   Monocytes Absolute 0.6 0.1 - 1.0 K/uL   Eosinophils Relative 5 %   Eosinophils Absolute 0.5 0.0 - 0.5 K/uL   Basophils Relative 1 %   Basophils Absolute 0.1 0.0 - 0.1 K/uL   Immature Granulocytes 0 %   Abs Immature Granulocytes 0.04 0.00 - 0.07 K/uL    Comment: Performed at [facility] [address] [place], [place] [address]  Protime-INR     Status: None   Collection Time: 06/11/19  4:50 PM  Result Value Ref Range   Prothrombin Time 13.0 11.4 - 15.2 seconds   INR 1.0 0.8 - 1.2    Comment: (NOTE) INR goal varies based on device and disease states. Performed at [facility], [place] [address] [place], [place] [address]   APTT     Status: None  Collection Time: 06/11/19  4:50 PM  Result Value Ref Range   aPTT 25 24 - 36 seconds    Comment: Performed at [facility], [place] [address] [place], [place] [address]     PHQ2/9: Depression screen [facility] 2/9 06/14/2019 02/16/2019 11/16/2018 07/19/2018 11/10/2017  Decreased Interest 1 1 1 1  0  Down, Depressed, Hopeless 1 1 1 1  0  PHQ - 2 Score 2 2 2 2  0  Altered sleeping 0 0 1 0 0  Tired, decreased  energy 1 1 1 1  0  Change in appetite 0 0 0 0 0  Feeling bad or failure about yourself  1 0 0 0 0  Trouble concentrating 0 0 1 0 0  Moving slowly or fidgety/restless 0 1 1 1  0  Suicidal thoughts - 0 0 0 0  PHQ-9 Score 4 4 6 4  0  Difficult doing work/chores Somewhat difficult Somewhat difficult Not difficult at all Somewhat difficult Not difficult at all    phq 9 is positive   Fall Risk: Fall Risk  08/17/2019 06/14/2019 02/16/2019 11/16/2018 07/19/2018  Falls in the past year? 1 1 0 1 0  Number falls in past yr: 1 1 0 1 0  Comment 10 or 11 - - - -  Injury with Fall? 1 1 0 1 0  Risk Factor Category  - - - - -  Risk for fall due to : History of fall(s);Impaired balance/gait Impaired balance/gait - Impaired balance/gait;History of fall(s) -  Risk for fall due to: Comment - - - - -  Follow up - - - Falls prevention discussed -     Functional Status Survey: Is the patient deaf or have difficulty hearing?: No Does the patient have difficulty seeing, even when wearing glasses/contacts?: No Does the patient have difficulty concentrating, remembering, or making decisions?: No Does the patient have difficulty walking or climbing stairs?: Yes Does the patient have difficulty dressing or bathing?: No Does the patient have difficulty doing errands alone such as visiting a doctor's office or shopping?: No   Assessment & Plan  1. Hypertension, benign  - olmesartan (BENICAR) 40 MG tablet; Take 1 tablet (40 mg total) by mouth daily.  Dispense: 90 tablet; Refill: 1 - Nebivolol HCl (BYSTOLIC) 20 MG TABS; Take 1 tablet (20 mg total) by mouth daily.  Dispense: 90 tablet; Refill: 1  2. History of TIA (transient ischemic attack)  - rosuvastatin (CRESTOR) 5 MG tablet; Take 1 tablet (5 mg total) by mouth daily.  Dispense: 90 tablet; Refill: 1  3. Dyslipidemia  - rosuvastatin (CRESTOR) 5 MG tablet; Take 1 tablet (5 mg total) by mouth daily.  Dispense: 90 tablet; Refill: 1  4. GAD (generalized  anxiety disorder)  - DULoxetine (CYMBALTA) 60 MG capsule; Take 1 capsule (60 mg total) by mouth daily.  Dispense: 90 capsule; Refill: 1 - busPIRone (BUSPAR) 5 MG tablet; Take 1 tablet (5 mg total) by mouth 3 (three) times daily as needed.  Dispense: 270 tablet; Refill: 1  5. Burning feet syndrome  - DULoxetine (CYMBALTA) 60 MG capsule; Take 1 capsule (60 mg total) by mouth daily.  Dispense: 90 capsule; Refill: 1  6. Colon cancer screening  - Cologuard.

## 2019-09-13 ENCOUNTER — Other Ambulatory Visit: Payer: Self-pay | Admitting: Family Medicine

## 2019-09-13 ENCOUNTER — Telehealth: Payer: Self-pay | Admitting: Family Medicine

## 2019-09-13 DIAGNOSIS — M109 Gout, unspecified: Secondary | ICD-10-CM

## 2019-09-13 MED ORDER — COLCHICINE 0.6 MG PO TABS: 1.2000 mg | ORAL_TABLET | Freq: Every day | ORAL | 1 refills | Status: DC | PRN

## 2019-09-13 NOTE — Telephone Encounter (Signed)
Patient is calling again regarding her gout flareup.  Patient would like some medication asap.

## 2019-09-13 NOTE — Telephone Encounter (Signed)
Patient calling back about this request. Requesting call back.  

## 2019-09-13 NOTE — Telephone Encounter (Signed)
Requested medication (s) are due for refill today: yes  Requested medication (s) are on the active medication list: yes  Last refill:  06/29/18  #30  1 refill  Future visit scheduled:yes  Notes to clinic:  Creat elevated 08/17/19    Requested Prescriptions  Pending Prescriptions Disp Refills   colchicine 0.6 MG tablet [Pharmacy Med Name: COLCHICINE 0.6MG  TABLETS] 30 tablet 1    Sig: TAKE 2 TABLETS(1.2 MG) BY MOUTH DAILY AS NEEDED. MAX 1.8 MG/ 24 HOURS      Endocrinology:  Gout Agents Failed - 09/13/2019 11:39 AM      Failed - Uric Acid in normal range and within 360 days    Uric Acid, Serum  Date Value Ref Range Status  11/16/2018 7.3 (H) 2.5 - 7.0 mg/dL Final    Comment:    Therapeutic target for gout patients: <6.0 mg/dL .    Uric Acid  Date Value Ref Range Status  01/25/2016 8.7 (H) 2.5 - 7.1 mg/dL Final    Comment:               Therapeutic target for gout patients: <6.0          Failed - Cr in normal range and within 360 days    Creat  Date Value Ref Range Status  08/17/2019 1.00 (H) 0.60 - 0.93 mg/dL Final    Comment:    For patients >36 years of age, the reference limit for Creatinine is approximately 13% higher for people identified as African-American. .    Creatinine, Urine  Date Value Ref Range Status  03/16/2018 62 20 - 275 mg/dL Final          Passed - Valid encounter within last 12 months    Recent Outpatient Visits           3 weeks ago Colon cancer screening   [facility] Steele Sizer, MD   3 months ago History of TIA (transient ischemic attack)   [facility] Steele Sizer, MD   6 months ago Hypertension, benign   [facility] Steele Sizer, MD   10 months ago X-linked hypophosphatemic rickets   [facility] Steele Sizer, MD   1 year ago GAD (generalized anxiety disorder)   [facility] Steele Sizer, MD       Future  Appointments             In 2 months  [facility], [place]   In 5 months Steele Sizer, MD [facility]

## 2019-09-13 NOTE — Telephone Encounter (Signed)
Patient is calling again regarding her gout flareup.  Patient would like some medication asap.  Please advise and call patient to give her an update.

## 2019-09-13 NOTE — Telephone Encounter (Signed)
No appointments available and she was recently seen. Can she get something for Gout flare

## 2019-09-14 ENCOUNTER — Telehealth: Payer: Self-pay

## 2019-09-14 NOTE — Telephone Encounter (Signed)
Pt states she got a call from office and did not get to answer. Pt aware med switched to [place]. Pt states she is not hurting quite as bad as she was yesterday.  Pt concerned she may need to go on a maintenance of this med, or have refills on hand.  But she will discuss at next visit.

## 2019-09-14 NOTE — Telephone Encounter (Signed)
Please see if you can get her in for a virtual appointment

## 2019-09-14 NOTE — Telephone Encounter (Signed)
lvm to inform pt that prescription has been sent to the pharmacy

## 2019-09-14 NOTE — Telephone Encounter (Signed)
Patient's pharmacy [facility]) reports that the Colchicine 0.6MG  Tab that were prescribed is not covered by her insurance plan. They prefer Mitigare and are requesting it be prescribed at this time. Thank you in advance.

## 2019-09-16 ENCOUNTER — Other Ambulatory Visit: Payer: Self-pay

## 2019-09-16 DIAGNOSIS — M109 Gout, unspecified: Secondary | ICD-10-CM

## 2019-09-16 MED ORDER — ALLOPURINOL 100 MG PO TABS: 100.0000 mg | ORAL_TABLET | Freq: Two times a day (BID) | ORAL | 1 refills | Status: DC

## 2019-09-16 NOTE — Telephone Encounter (Signed)
Copied from [place] [phone]. Topic: General - Other >> Sep 15, 2019  1:58 PM Mcneil, Ja-Kwan wrote: Reason for CRM: Pt stated she is not able to take the medication that she was prescribed and requests that a Rx for allopurinol (ZYLOPRIM) 100 MG tablet be sent to her pharmacy. Pt also requests a referral to [facility] fax# [phone]

## 2019-09-28 ENCOUNTER — Emergency Department: Payer: Medicare PPO

## 2019-09-28 ENCOUNTER — Encounter: Payer: Self-pay | Admitting: Emergency Medicine

## 2019-09-28 ENCOUNTER — Emergency Department
Admission: EM | Admit: 2019-09-28 | Discharge: 2019-09-28 | Disposition: A | Discharge status: Home / Self Care | Payer: Medicare PPO | Attending: Emergency Medicine | Admitting: Emergency Medicine

## 2019-09-28 ENCOUNTER — Other Ambulatory Visit: Payer: Self-pay

## 2019-09-28 DIAGNOSIS — S99922A Unspecified injury of left foot, initial encounter: Secondary | ICD-10-CM | POA: Diagnosis present

## 2019-09-28 DIAGNOSIS — S82255A Nondisplaced comminuted fracture of shaft of left tibia, initial encounter for closed fracture: Secondary | ICD-10-CM | POA: Diagnosis not present

## 2019-09-28 DIAGNOSIS — Z7982 Long term (current) use of aspirin: Secondary | ICD-10-CM | POA: Insufficient documentation

## 2019-09-28 DIAGNOSIS — Y939 Activity, unspecified: Secondary | ICD-10-CM | POA: Insufficient documentation

## 2019-09-28 DIAGNOSIS — I10 Essential (primary) hypertension: Secondary | ICD-10-CM | POA: Insufficient documentation

## 2019-09-28 DIAGNOSIS — Y929 Unspecified place or not applicable: Secondary | ICD-10-CM | POA: Insufficient documentation

## 2019-09-28 DIAGNOSIS — W06XXXA Fall from bed, initial encounter: Secondary | ICD-10-CM | POA: Insufficient documentation

## 2019-09-28 DIAGNOSIS — R52 Pain, unspecified: Secondary | ICD-10-CM | POA: Diagnosis not present

## 2019-09-28 DIAGNOSIS — Z87891 Personal history of nicotine dependence: Secondary | ICD-10-CM | POA: Insufficient documentation

## 2019-09-28 DIAGNOSIS — R600 Localized edema: Secondary | ICD-10-CM | POA: Diagnosis not present

## 2019-09-28 DIAGNOSIS — Y999 Unspecified external cause status: Secondary | ICD-10-CM | POA: Diagnosis not present

## 2019-09-28 DIAGNOSIS — Z79899 Other long term (current) drug therapy: Secondary | ICD-10-CM | POA: Insufficient documentation

## 2019-09-28 DIAGNOSIS — R609 Edema, unspecified: Secondary | ICD-10-CM | POA: Diagnosis not present

## 2019-09-28 DIAGNOSIS — M79672 Pain in left foot: Secondary | ICD-10-CM | POA: Diagnosis present

## 2019-09-28 DIAGNOSIS — S82225A Nondisplaced transverse fracture of shaft of left tibia, initial encounter for closed fracture: Secondary | ICD-10-CM | POA: Diagnosis not present

## 2019-09-28 LAB — CBC WITH DIFFERENTIAL/PLATELET
Abs Immature Granulocytes: 0.03 10*3/uL (ref 0.00–0.07)
Basophils Absolute: 0.1 10*3/uL (ref 0.0–0.1)
Basophils Relative: 1 %
Eosinophils Absolute: 0.5 10*3/uL (ref 0.0–0.5)
Eosinophils Relative: 6 %
HCT: 38.1 % (ref 36.0–46.0)
Hemoglobin: 11.5 g/dL — ABNORMAL LOW (ref 12.0–15.0)
Immature Granulocytes: 0 %
Lymphocytes Relative: 20 %
Lymphs Abs: 1.8 10*3/uL (ref 0.7–4.0)
MCH: 24.5 pg — ABNORMAL LOW (ref 26.0–34.0)
MCHC: 30.2 g/dL (ref 30.0–36.0)
MCV: 81.2 fL (ref 80.0–100.0)
Monocytes Absolute: 0.4 10*3/uL (ref 0.1–1.0)
Monocytes Relative: 5 %
Neutro Abs: 6.2 10*3/uL (ref 1.7–7.7)
Neutrophils Relative %: 68 %
Platelets: 301 10*3/uL (ref 150–400)
RBC: 4.69 MIL/uL (ref 3.87–5.11)
RDW: 15.2 % (ref 11.5–15.5)
WBC: 9.1 10*3/uL (ref 4.0–10.5)
nRBC: 0 % (ref 0.0–0.2)

## 2019-09-28 LAB — BASIC METABOLIC PANEL
Anion gap: 10 (ref 5–15)
BUN: 20 mg/dL (ref 8–23)
CO2: 27 mmol/L (ref 22–32)
Calcium: 9.5 mg/dL (ref 8.9–10.3)
Chloride: 103 mmol/L (ref 98–111)
Creatinine, Ser: 0.91 mg/dL (ref 0.44–1.00)
GFR calc Af Amer: >60 mL/min (ref >60)
GFR calc non Af Amer: >60 mL/min (ref >60)
Glucose, Bld: 107 mg/dL — ABNORMAL HIGH (ref 70–99)
Potassium: 4.5 mmol/L (ref 3.5–5.1)
Sodium: 140 mmol/L (ref 135–145)

## 2019-09-28 LAB — URIC ACID: Uric Acid, Serum: 3.9 mg/dL (ref 2.5–7.1)

## 2019-09-28 LAB — SEDIMENTATION RATE: Sed Rate: 103 mm/hr — ABNORMAL HIGH (ref 0–30)

## 2019-09-28 MED ORDER — TRAMADOL HCL 50 MG PO TABS
50.0000 mg | ORAL_TABLET | Freq: Once | ORAL | Status: AC
  Administered 2019-09-28: 50 mg via ORAL
  Filled 2019-09-28: qty 1

## 2019-09-28 MED ORDER — TRAMADOL HCL 50 MG PO TABS: 50.0000 mg | ORAL_TABLET | Freq: Two times a day (BID) | ORAL | 0 refills | Status: DC | PRN

## 2019-09-28 NOTE — ED Notes (Signed)
Pt asking this tech to speak with the MD regarding someone "coming to take care of me at my house" pt son demanding that they see someone about someone coming to her house to take care of her because she isn't "able to care for herself. Randel Pigg, PA notified

## 2019-09-28 NOTE — Discharge Instructions (Signed)
Follow discharge care instruction.  Wear splint and ambulate with crutches until evaluation by orthopedics.  Call in the morning and tell them you a follow-up in emergency room.

## 2019-09-28 NOTE — ED Notes (Signed)
Pt placed on bedpan by this tech and Angie RT, pt getting xray at this time at bedside

## 2019-09-28 NOTE — ED Triage Notes (Signed)
Patient presents to the ED via EMS from home.  Patient is complaining of left foot pain.  Patient has history of gout to left foot and told EMS, this feels similar but yesterday patient slid out of bed and landed on left foot and at that point, foot pain increased.  Patient is in no obvious distress at this time.

## 2019-09-28 NOTE — ED Notes (Signed)
Pt placed on bedpan by this Probation officer

## 2019-09-28 NOTE — ED Notes (Signed)
See triage note  Presents with left foot pain  States she fell out of bed yesterday  And landed on left foot  But also has a hx of gout

## 2019-09-28 NOTE — ED Provider Notes (Addendum)
[facility] Emergency Department Provider Note   ____________________________________________   First MD Initiated Contact with Patient 09/28/19 1303     (approximate)  I have reviewed the triage vital signs and the nursing notes.   HISTORY  Chief Complaint Foot Pain    HPI Felicia Robinson is a 74 y.o. female patient presents with left foot pain and edema.  Patient has a history of gout and states this feels similar.  Patient was concerned because she slid out of bed yesterday landing on her left foot at which time her pain increased.  Patient is unable to bear weight secondary to pain.  Patient rates her pain as a 6 out of 10.  Patient scribed pain is "achy".  No palliative measure for complaint.         Past Medical History:  Diagnosis Date  . Breast cancer ([place]) 2013   left breast  . Cancer ([place]) 2013   L breast  . Disorders of phosphorus metabolism    rickets  . Fibroadenosis of breast   . Gout 2013  . Hypertension 2009  . Malignant neoplasm of upper-outer quadrant of female breast ([place]) 09/30/2011   7 mm low-grade invasive mammary carcinoma with focal DCIS. ER 90%, PR 1%, HER-2/neu not overexpressing.  . Osteomalacia   . Shingles 2013    Patient Active Problem List   Diagnosis Date Noted  . Stress fracture of right femur with delayed healing 04/27/2017  . Osteoarthritis, knee 07/26/2015  . Other bilateral secondary osteoarthritis of hip 07/26/2015  . Hypertension, benign 01/29/2015  . Controlled gout 01/29/2015  . X-linked hypophosphatemic rickets 01/29/2015  . GAD (generalized anxiety disorder) 01/29/2015  . Gait instability 01/29/2015  . Recurrent falls 01/29/2015  . Acquired scoliosis 01/29/2015  . History of breast cancer in female 09/08/2012  . H/O rickets 09/08/2012  . History of shingles 12/06/2011    Past Surgical History:  Procedure Laterality Date  . BREAST SURGERY Left 09/30/2011   Left simple mastectomy with  sentinel node biopsy  . CATARACT EXTRACTION, BILATERAL Bilateral 04/2015   Dr. Tommy Rainwater in [place]  . COLONOSCOPY  2012  . DILATION AND CURETTAGE OF UTERUS  1982  . MASTECTOMY Left 2013   with SN biopsy  . TONSILLECTOMY  1953  . UPPER GI ENDOSCOPY  2012    Prior to Admission medications   Medication Sig Start Date End Date Taking? Authorizing Provider  allopurinol (ZYLOPRIM) 100 MG tablet Take 1 tablet (100 mg total) by mouth 2 (two) times daily. 09/16/19   Steele Sizer, MD  aspirin 81 MG chewable tablet Chew 81 mg by mouth daily.    [provider]  Burosumab-twza (CRYSVITA) 30 MG/ML SOLN Inject into the muscle. 06/15/19   [provider]  busPIRone (BUSPAR) 5 MG tablet Take 1 tablet (5 mg total) by mouth 3 (three) times daily as needed. 08/17/19   Steele Sizer, MD  cholecalciferol (VITAMIN D3) 25 MCG (1000 UT) tablet Take 1,000 Units by mouth daily.    [provider]  colchicine 0.6 MG tablet Take 2 tablets (1.2 mg total) by mouth daily as needed. Max 1.'8mg'$ Bailey Mech 09/13/19   Steele Sizer, MD  Cyanocobalamin (B-12) 1000 MCG SUBL Place 1 tablet under the tongue 3 (three) times a week. 11/11/17   Steele Sizer, MD  diclofenac (VOLTAREN) 50 MG EC tablet Take by mouth. 08/08/19 08/07/20  [provider]  DULoxetine (CYMBALTA) 60 MG capsule Take 1 capsule (60 mg total) by mouth daily.  08/17/19   Steele Sizer, MD  hydrALAZINE (APRESOLINE) 25 MG tablet Take 1 tablet (25 mg total) by mouth 3 (three) times daily. If bp above 150/90 01/17/19   Steele Sizer, MD  Nebivolol HCl (BYSTOLIC) 20 MG TABS Take 1 tablet (20 mg total) by mouth daily. 08/17/19   Steele Sizer, MD  olmesartan (BENICAR) 40 MG tablet Take 1 tablet (40 mg total) by mouth daily. 08/17/19   Steele Sizer, MD  omega-3 acid ethyl esters (LOVAZA) 1 g capsule TAKE 1 CAPSULES BY MOUTH TWICE DAILY 05/03/19   Ancil Boozer, Drue Stager, MD  rosuvastatin (CRESTOR) 5 MG tablet Take 1 tablet (5 mg total) by  mouth daily. 08/17/19   Steele Sizer, MD  traMADol (ULTRAM) 50 MG tablet Take 1 tablet (50 mg total) by mouth every 12 (twelve) hours as needed. 09/28/19   Sable Feil, PA-C    Allergies Allopurinol, Codeine, Other, and Shellfish allergy  Family History  Problem Relation Age of Onset  . Lymphoma Brother 91       Hodgkin's Lymphoma  . Cancer Brother        non hodgkins lymphoma   . Heart disease Brother   . Cancer Maternal Uncle 70       cancer of the esophagus  . Alzheimer's disease Mother   . Stroke Father   . Breast cancer Neg Hx     Social History Social History   Tobacco Use  . Smoking status: Former Smoker    Packs/day: 0.50    Years: 30.00    Pack years: 15.00    Types: Cigarettes    Quit date: 04/21/2006    Years since quitting: 13.4  . Smokeless tobacco: Never Used  Substance Use Topics  . Alcohol use: Yes    Alcohol/week: 1.0 standard drinks    Types: 1 Glasses of wine per week  . Drug use: No    Review of Systems Constitutional: No fever/chills Eyes: No visual changes. ENT: No sore throat. Cardiovascular: Denies chest pain. Respiratory: Denies shortness of breath. Gastrointestinal: No abdominal pain.  No nausea, no vomiting.  No diarrhea.  No constipation. Genitourinary: Negative for dysuria. Musculoskeletal: Left foot and ankle pain. Skin: Negative for rash. Neurological: Negative for headaches, focal weakness or numbness. Endocrine:  Gout, hyperlipidemia, hypertension.   Allergic/Immunilogical: Allopurinol, codeine, and shellfish allergies. ____________________________________________   PHYSICAL EXAM:  VITAL SIGNS: ED Triage Vitals  Enc Vitals Group     BP 09/28/19 1303 (!) 202/103     Pulse Rate 09/28/19 1303 62     Resp 09/28/19 1303 16     Temp 09/28/19 1303 98.8 F (37.1 C)     Temp Source 09/28/19 1303 Oral     SpO2 09/28/19 1303 96 %     Weight 09/28/19 1304 120 lb (54.4 kg)     Height 09/28/19 1304 '4\' 7"'$  (1.397 m)     Head  Circumference --      Peak Flow --      Pain Score 09/28/19 1304 6     Pain Loc --      Pain Edu? --      Excl. in [place]? --     Constitutional: Alert and oriented. Well appearing and in no acute distress. Cardiovascular: Normal rate, regular rhythm. Grossly normal heart sounds.  Good peripheral circulation.  Elevated blood pressure. Respiratory: Normal respiratory effort.  No retractions. Lungs CTAB. Musculoskeletal: No obvious deformity to the left ankle/foot.  Moderate edema.   Neurologic:  Normal speech and  language. No gross focal neurologic deficits are appreciated. No gait instability. Skin:  Skin is warm, dry and intact. No rash noted. Psychiatric: Mood and affect are normal. Speech and behavior are normal.  ____________________________________________   LABS (all labs ordered are listed, but only abnormal results are displayed)  Labs Reviewed  BASIC METABOLIC PANEL - Abnormal; Notable for the following components:      Result Value   Glucose, Bld 107 (*)    All other components within normal limits  CBC WITH DIFFERENTIAL/PLATELET - Abnormal; Notable for the following components:   Hemoglobin 11.5 (*)    MCH 24.5 (*)    All other components within normal limits  SEDIMENTATION RATE - Abnormal; Notable for the following components:   Sed Rate 103 (*)    All other components within normal limits  URIC ACID   ____________________________________________  EKG   ____________________________________________  RADIOLOGY  ED MD interpretation:    Official radiology report(s): DG Ankle Complete Left  Result Date: 09/28/2019 CLINICAL DATA:  Slipped out of bed and landed on LEFT foot, pain and edema, history gout, breast cancer EXAM: LEFT ANKLE COMPLETE - 3+ VIEW COMPARISON:  None FINDINGS: Diffuse soft tissue swelling of the lower LEFT leg. Osseous demineralization. Joint spaces preserved. Nondisplaced transverse distal LEFT tibial diaphyseal fracture. No additional fracture,  dislocation, or bone destruction. Scattered spur formation at calcaneus and at intertarsal joints. IMPRESSION: Nondisplaced transverse fracture of distal LEFT tibial diaphysis. Electronically Signed   By: Lavonia Dana M.D.   On: 09/28/2019 15:44   DG Foot Complete Left  Result Date: 09/28/2019 CLINICAL DATA:  Slip, pain, edema, unable to bear weight EXAM: LEFT FOOT - COMPLETE 3+ VIEW COMPARISON:  None. FINDINGS: Osteopenia. No displaced fracture or dislocation of the left foot. There is a partially imaged fracture deformity of the distal left tibia seen on lateral view only. There is unusual arthrosis throughout the foot and ankle, involving the metacarpophalangeal joints, midfoot, and ankle mortise. Diffuse soft tissue edema about the forefoot. IMPRESSION: 1. No displaced fracture or dislocation of the left foot. 2. There is a partially imaged fracture deformity of the distal left tibia seen on lateral view only; consider dedicated radiograph. 3. There is unusual arthrosis throughout the foot and ankle, involving the metacarpophalangeal joints, midfoot, and ankle mortise, of uncertain significance, possibly neuropathic or inflammatory. 4. Osteopenia. 5. Diffuse soft tissue edema about the forefoot. Electronically Signed   By: Eddie Candle M.D.   On: 09/28/2019 14:39    ____________________________________________   PROCEDURES  Procedure(s) performed (including Critical Care):  Procedures   ____________________________________________   INITIAL IMPRESSION / ASSESSMENT AND PLAN / ED COURSE  As part of my medical decision making, I reviewed the following data within the [place]     Patient presents with left foot pain and edema secondary to a slip and incident get out of bed.  Discussed x-ray findings with patient's showing a distal transverse nondisplaced fracture of the left tibia.  Patient was splinted and given crutches to assist with ambulation.  Patient advised  nonweightbearing until evaluation by orthopedics.  Patient advised to call in the morning to schedule appointment.  Continue previous medication.  May add tramadol as needed for acute pain every 12 hours.   Felicia Robinson was evaluated in Emergency Department on 09/28/2019 for the symptoms described in the history of present illness. She was evaluated in the context of the global COVID-19 pandemic, which necessitated consideration that the patient might be  at risk for infection with the SARS-CoV-2 virus that causes COVID-19. Institutional protocols and algorithms that pertain to the evaluation of patients at risk for COVID-19 are in a state of rapid change based on information released by regulatory bodies including the CDC and federal and state organizations. These policies and algorithms were followed during the patient's care in the ED.       ____________________________________________   FINAL CLINICAL IMPRESSION(S) / ED DIAGNOSES  Final diagnoses:  Nondisplaced comminuted fracture of shaft of left tibia, initial encounter for closed fracture     ED Discharge Orders         Ordered    traMADol (ULTRAM) 50 MG tablet  Every 12 hours PRN     09/28/19 1559           Note:  This document was prepared using Dragon voice recognition software and may include unintentional dictation errors.    Sable Feil, PA-C 09/28/19 1606    Sable Feil, PA-C 09/28/19 1607    Nena Polio, MD 09/28/19 [phone]

## 2019-10-03 ENCOUNTER — Other Ambulatory Visit: Payer: Self-pay | Admitting: Family Medicine

## 2019-10-03 DIAGNOSIS — Z8673 Personal history of transient ischemic attack (TIA), and cerebral infarction without residual deficits: Secondary | ICD-10-CM

## 2019-10-03 DIAGNOSIS — E785 Hyperlipidemia, unspecified: Secondary | ICD-10-CM

## 2019-10-04 ENCOUNTER — Other Ambulatory Visit: Payer: Self-pay | Admitting: General Surgery

## 2019-10-04 DIAGNOSIS — Z853 Personal history of malignant neoplasm of breast: Secondary | ICD-10-CM

## 2019-10-05 DIAGNOSIS — S82302A Unspecified fracture of lower end of left tibia, initial encounter for closed fracture: Secondary | ICD-10-CM | POA: Diagnosis not present

## 2019-10-05 DIAGNOSIS — W06XXXA Fall from bed, initial encounter: Secondary | ICD-10-CM | POA: Diagnosis not present

## 2019-10-07 ENCOUNTER — Telehealth: Payer: Self-pay

## 2019-10-07 NOTE — Telephone Encounter (Signed)
Copied from [place] [id number]. Topic: Referral - Request for Referral >> Oct 07, 2019 10:52 AM Erick Blinks wrote: Pt's son called requesting a referral for rehab. Pt broke her tibia over a week ago and is in a cast. Please advise  Best contact: [place]

## 2019-10-08 ENCOUNTER — Other Ambulatory Visit: Payer: Self-pay | Admitting: Family Medicine

## 2019-10-08 DIAGNOSIS — E785 Hyperlipidemia, unspecified: Secondary | ICD-10-CM

## 2019-10-08 NOTE — Telephone Encounter (Signed)
Requested Prescriptions  Pending Prescriptions Disp Refills  . omega-3 acid ethyl esters (LOVAZA) 1 g capsule [Pharmacy Med Name: OMEGA-3-ACID 1GM CAPSULES (RX)] 180 capsule 1    Sig: TAKE 1 CAPSULES BY MOUTH TWICE DAILY     Endocrinology:  Nutritional Agents Passed - 10/08/2019  3:23 PM      Passed - Valid encounter within last 12 months    Recent Outpatient Visits          1 month ago Colon cancer screening   [facility] Steele Sizer, MD   3 months ago History of TIA (transient ischemic attack)   [facility] Steele Sizer, MD   7 months ago Hypertension, benign   [facility] Steele Sizer, MD   10 months ago X-linked hypophosphatemic rickets   [facility] Steele Sizer, MD   1 year ago GAD (generalized anxiety disorder)   [facility] Steele Sizer, MD      Future Appointments            In 1 month  [facility], [place]   In 4 months Steele Sizer, MD [facility], [facility]

## 2019-10-10 NOTE — Telephone Encounter (Signed)
Spoke to [place]. He said he called and tried to get a referral through Steele they said it has to come from PCP unless they use [facility]. He will make a virtual appointment with you for a referral. Call was transferred to front for scheduling.

## 2019-10-11 ENCOUNTER — Ambulatory Visit: Payer: Self-pay | Admitting: *Deleted

## 2019-10-11 ENCOUNTER — Encounter: Payer: Self-pay | Admitting: Family Medicine

## 2019-10-11 ENCOUNTER — Telehealth (INDEPENDENT_AMBULATORY_CARE_PROVIDER_SITE_OTHER): Payer: Medicare PPO | Admitting: Family Medicine

## 2019-10-11 VITALS — Ht <= 58 in | Wt 120.0 lb

## 2019-10-11 DIAGNOSIS — Z789 Other specified health status: Secondary | ICD-10-CM | POA: Diagnosis not present

## 2019-10-11 DIAGNOSIS — Z7409 Other reduced mobility: Secondary | ICD-10-CM

## 2019-10-11 DIAGNOSIS — S82225A Nondisplaced transverse fracture of shaft of left tibia, initial encounter for closed fracture: Secondary | ICD-10-CM | POA: Diagnosis not present

## 2019-10-11 NOTE — Chronic Care Management (AMB) (Signed)
   Chronic Care Management   Unsuccessful Call Note 10/11/2019 Name: Felicia Robinson MRN: [id number] DOB: 1945/05/02  Patient  is a 74 year old female who sees Delsa Grana, PA-C for primary care. Delsa Grana, PA-C asked the CCM team to consult the patient for Level of Care Concerns. Referral was placed on 10/11/19. Patient's last office visit was 10/10/19.     This social worker was unable to reach patient via telephone today for follow up care. I have left HIPAA compliant voicemail asking patient to return my call. (unsuccessful outreach #1).   Plan: Will follow-up within 7 business days via telephone.     Elliot Gurney, [place] Administrator, [facility] Care Management [phone]

## 2019-10-11 NOTE — Progress Notes (Signed)
Name: Felicia Robinson   MRN: [id number]    DOB: April 05, 1946   Date:10/11/2019       Progress Note  Subjective:    Chief Complaint  Chief Complaint  Patient presents with  . broke leg    left Tibia, Son wants urgent home placement, husband and son unable to take care of her and she needs assistance with daily ADL's  . Referral    Urgent placed with social worker to help get patient placed.  Told son this is a process, med list updated to current meds    I connected with  JEANEANE ADAMEC  on 10/11/19 at 11:40 AM EDT by a video enabled telemedicine application and verified that I am speaking with the correct person using two identifiers.  I discussed the limitations of evaluation and management by telemedicine and the availability of in person appointments. The patient expressed understanding and agreed to proceed. Staff also discussed with the patient that there may be a patient responsible charge related to this service. Patient Location:  home Provider Location:  [facility]   Additional Individuals present: son Leroy Sea  HPI Patient is a 74 year old female patient of Dr. Ancil Boozer, present today with concerns of difficulty at home after breaking her leg about 2 weeks ago.  She fell out of bed and sustained a left tibial fracture, was seen at [facility], did have follow-up with Sampson Si, Dr. Marry Guan and [place].  She is in a long-leg cast, is seated in a wheelchair during the virtual encounter, states that she has trouble getting in and out of bed, having trouble bathing.  The patient lives at home alone with her husband who has difficulty helping her.  And the only other family nearby is their son Leroy Sea who is able to help sometimes but he is "stretched" and they want "placement" right away (ie: like today want to talk to someone). They have seen orthopedic once, last week, they have follow-up in about 2 to 3 weeks, she is expected to be in a cast for around 10 weeks with the possibility of changing from the  long-leg cast to a boot or lower leg cast.    I have reviewed the chart there was a home health order sent in by the Ortho PA on 10/06/2019 - to [facility] Phone call last week Friday to Dr. Ancil Boozer asking about a referral for rehab, Dr. Ancil Boozer responded that pt should inquire from Ortho or had to do a virtual visit.    Patient Active Problem List   Diagnosis Date Noted  . Stress fracture of right femur with delayed healing 04/27/2017  . Osteoarthritis, knee 07/26/2015  . Other bilateral secondary osteoarthritis of hip 07/26/2015  . Hypertension, benign 01/29/2015  . Controlled gout 01/29/2015  . X-linked hypophosphatemic rickets 01/29/2015  . GAD (generalized anxiety disorder) 01/29/2015  . Gait instability 01/29/2015  . Recurrent falls 01/29/2015  . Acquired scoliosis 01/29/2015  . History of breast cancer in female 09/08/2012  . H/O rickets 09/08/2012  . History of shingles 12/06/2011    Social History   Tobacco Use  . Smoking status: Former Smoker    Packs/day: 0.50    Years: 30.00    Pack years: 15.00    Types: Cigarettes    Quit date: 04/21/2006    Years since quitting: 13.4  . Smokeless tobacco: Never Used  Substance Use Topics  . Alcohol use: Yes    Alcohol/week: 1.0 standard drink    Types: 1 Glasses of  wine per week     Current Outpatient Medications:  .  allopurinol (ZYLOPRIM) 100 MG tablet, Take 1 tablet (100 mg total) by mouth 2 (two) times daily., Disp: 180 tablet, Rfl: 1 .  ALPRAZolam (XANAX) 0.25 MG tablet, Take 0.25 mg by mouth 2 (two) times daily as needed for anxiety., Disp: , Rfl:  .  aspirin 81 MG chewable tablet, Chew 81 mg by mouth daily., Disp: , Rfl:  .  busPIRone (BUSPAR) 5 MG tablet, Take 1 tablet (5 mg total) by mouth 3 (three) times daily as needed., Disp: 270 tablet, Rfl: 1 .  calcitRIOL (ROCALTROL) 0.25 MCG capsule, Take 0.25 capsules by mouth 2 (two) times daily., Disp: , Rfl:  .  Cyanocobalamin (B-12) 1000 MCG SUBL, Place 1 tablet under the  tongue 3 (three) times a week., Disp: 30 each, Rfl: 2 .  DULoxetine (CYMBALTA) 60 MG capsule, Take 1 capsule (60 mg total) by mouth daily., Disp: 90 capsule, Rfl: 1 .  ibuprofen (ADVIL) 200 MG tablet, Take 200 mg by mouth every 6 (six) hours as needed., Disp: , Rfl:  .  K Phos Mono-Sod Phos Di & Mono (K-PHOS-NEUTRAL) [phone] MG TABS, Take 250 mg by mouth daily. , Disp: , Rfl:  .  Nebivolol HCl (BYSTOLIC) 20 MG TABS, Take 1 tablet (20 mg total) by mouth daily., Disp: 90 tablet, Rfl: 1 .  olmesartan (BENICAR) 40 MG tablet, Take 1 tablet (40 mg total) by mouth daily., Disp: 90 tablet, Rfl: 1 .  omega-3 acid ethyl esters (LOVAZA) 1 g capsule, TAKE 1 CAPSULES BY MOUTH TWICE DAILY, Disp: 180 capsule, Rfl: 1 .  rosuvastatin (CRESTOR) 5 MG tablet, Take 1 tablet (5 mg total) by mouth daily., Disp: 90 tablet, Rfl: 1  Allergies  Allergen Reactions  . Allopurinol     diarrhea  . Codeine Nausea And Vomiting  . Other     Pain medications Altered mental status  . Shellfish Allergy Diarrhea and Nausea And Vomiting    Just when eating clams Just when eating clams Just when eating clams     I personally reviewed active problem list, medication list, allergies, family history, social history, health maintenance, notes from last encounter, lab results, imaging with the patient/caregiver today.   Review of Systems  10 Systems reviewed and are negative for acute change except as noted in the HPI.   Objective:   Virtual encounter, vitals limited, only able to obtain the following Today's Vitals   10/11/19 1145 10/11/19 1146  Weight: 120 lb (54.4 kg)   Height: 4\' 7"  (1.397 m)   PainSc:  3    Body mass index is 27.89 kg/m. Nursing Note and Vital Signs reviewed.  Physical Exam Vitals and nursing note reviewed.  Constitutional:      General: She is not in acute distress.    Appearance: She is not ill-appearing, toxic-appearing or diaphoretic.  Musculoskeletal:     Comments: Left leg cast  from foot to thigh  Neurological:     Mental Status: She is alert.     Comments: Seated in [facility]     PE limited by telephone encounter  No results found for this or any previous visit (from the past 72 hour(s)).  Assessment and Plan:     ICD-10-CM   1. Closed nondisplaced transverse fracture of shaft of left tibia, initial encounter  S82.225A Ambulatory referral to Chronic Care Management Services  2. Impaired mobility and ADLs  Z74.09 Ambulatory referral to Chronic Care Management Services  Z78.9    temporary - ER visit dx with left tibia fx, outpt ortho [facility], pt in long leg cast, elderly husband and no other help at home, difficulty with ADLs      In speaking with the patient and with her son they were sounding frustrated today.  They were told that the social worker would call them today, I tried to explain that they had to have a visit with a provider in order to document what has happened and what help they need, then the referral could go to [place] had offered [facility] referral to have PT/OT or nursing come to them to do an assessment, and would send CCM referral through.  I explained that for temporary needs, w/o a hospitalization or surgery or a previous transition to nursing home or rehab facility it is very difficult to be medically qualified and have insurance pay for rehab or any type of 24 hour care.  I explained the SW could help connect them with resources locally that may be available outside of their insurance coverage.  I explained that the referral will be placed urgently but that usually means a 1-2 d turn around before they would be contacted, and this upset them.    - I discussed the assessment and treatment plan with the patient. The patient was provided an opportunity to ask questions and all were answered. The patient agreed with the plan and demonstrated an understanding of the instructions.  I provided 30 minutes of non-face-to-face time during this  encounter.  Delsa Grana, PA-C 10/11/19 12:10 PM

## 2019-10-12 ENCOUNTER — Ambulatory Visit: Payer: Self-pay | Admitting: *Deleted

## 2019-10-12 NOTE — Chronic Care Management (AMB) (Signed)
  Chronic Care Management   Social Work Note  10/12/2019 Name: Felicia Robinson MRN: [id number] DOB: [birth year]  Felicia Robinson is a 74 y.o. year old female who sees Steele Sizer, MD for primary care. The CCM team was consulted for assistance with Level of Care Concerns.   Return phone call from patient, however the signal was lost before this social worker was able to obtain information needed. This Education officer, museum called patient back and got her voicemail. Voicemail message left requesting a return call.  SDOH (Social Determinants of Health) assessments performed: No     Outpatient Encounter Medications as of 10/12/2019  Medication Sig  . allopurinol (ZYLOPRIM) 100 MG tablet Take 1 tablet (100 mg total) by mouth 2 (two) times daily.  Marland Kitchen ALPRAZolam (XANAX) 0.25 MG tablet Take 0.25 mg by mouth 2 (two) times daily as needed for anxiety.  Marland Kitchen aspirin 81 MG chewable tablet Chew 81 mg by mouth daily.  . busPIRone (BUSPAR) 5 MG tablet Take 1 tablet (5 mg total) by mouth 3 (three) times daily as needed.  . calcitRIOL (ROCALTROL) 0.25 MCG capsule Take 0.25 capsules by mouth 2 (two) times daily.  . Cyanocobalamin (B-12) 1000 MCG SUBL Place 1 tablet under the tongue 3 (three) times a week.  . DULoxetine (CYMBALTA) 60 MG capsule Take 1 capsule (60 mg total) by mouth daily.  Marland Kitchen ibuprofen (ADVIL) 200 MG tablet Take 200 mg by mouth every 6 (six) hours as needed.  . K Phos Mono-Sod Phos Di & Mono (K-PHOS-NEUTRAL) [phone] MG TABS Take 250 mg by mouth daily.   . Nebivolol HCl (BYSTOLIC) 20 MG TABS Take 1 tablet (20 mg total) by mouth daily.  Marland Kitchen olmesartan (BENICAR) 40 MG tablet Take 1 tablet (40 mg total) by mouth daily.  Marland Kitchen omega-3 acid ethyl esters (LOVAZA) 1 g capsule TAKE 1 CAPSULES BY MOUTH TWICE DAILY  . rosuvastatin (CRESTOR) 5 MG tablet Take 1 tablet (5 mg total) by mouth daily.   No facility-administered encounter medications on file as of 10/12/2019.    Goals Addressed   None     Follow Up  Plan: SW will follow up with patient by phone over the next 7-10 business days  [place], [place] Worker  [facility] Care Management [phone]

## 2019-10-12 NOTE — Chronic Care Management (AMB) (Signed)
   Chronic Care Management   Unsuccessful Call Note 10/12/2019 Name: Felicia Robinson MRN: [id number] DOB: 22-Oct-1945  Patient is a 74 year old female who sees Delsa Grana, PA-C for primary care. Delsa Grana, PA-C asked the CCM team to consult the patient for Level of Care Concerns.  Referral was placed on 10/11/19. Patient's last office visit was 10/11/19.     This social worker was unable to reach patient via telephone today for initial call. I have left HIPAA compliant voicemail asking patient to return my call. (unsuccessful outreach #[phone]).   Plan: Will follow-up within 7 business days via telephone.     Elliot Gurney, [place] Administrator, [facility] [phone]

## 2019-10-13 ENCOUNTER — Ambulatory Visit: Payer: Medicare PPO | Admitting: Family Medicine

## 2019-10-14 ENCOUNTER — Ambulatory Visit: Payer: Self-pay | Admitting: *Deleted

## 2019-10-14 ENCOUNTER — Encounter: Payer: Self-pay | Admitting: *Deleted

## 2019-10-14 NOTE — Progress Notes (Signed)
This encounter was created in error - please disregard.

## 2019-10-14 NOTE — Chronic Care Management (AMB) (Deleted)
  Chronic Care Management   Social Work Note  10/14/2019 Name: Felicia Robinson MRN: [id number] DOB: 11-Dec-1945  Felicia Robinson is a 74 y.o. year old female who sees Steele Sizer, MD for primary care. The CCM team was consulted for assistance with {CCM SW CONSULT REASONS:22330}.   SDOH (Social Determinants of Health) assessments performed: {yes/no:20286}     Outpatient Encounter Medications as of 10/14/2019  Medication Sig  . allopurinol (ZYLOPRIM) 100 MG tablet Take 1 tablet (100 mg total) by mouth 2 (two) times daily.  Marland Kitchen ALPRAZolam (XANAX) 0.25 MG tablet Take 0.25 mg by mouth 2 (two) times daily as needed for anxiety.  Marland Kitchen aspirin 81 MG chewable tablet Chew 81 mg by mouth daily.  . busPIRone (BUSPAR) 5 MG tablet Take 1 tablet (5 mg total) by mouth 3 (three) times daily as needed.  . calcitRIOL (ROCALTROL) 0.25 MCG capsule Take 0.25 capsules by mouth 2 (two) times daily.  . Cyanocobalamin (B-12) 1000 MCG SUBL Place 1 tablet under the tongue 3 (three) times a week.  . DULoxetine (CYMBALTA) 60 MG capsule Take 1 capsule (60 mg total) by mouth daily.  Marland Kitchen ibuprofen (ADVIL) 200 MG tablet Take 200 mg by mouth every 6 (six) hours as needed.  . K Phos Mono-Sod Phos Di & Mono (K-PHOS-NEUTRAL) [phone] MG TABS Take 250 mg by mouth daily.   . Nebivolol HCl (BYSTOLIC) 20 MG TABS Take 1 tablet (20 mg total) by mouth daily.  Marland Kitchen olmesartan (BENICAR) 40 MG tablet Take 1 tablet (40 mg total) by mouth daily.  Marland Kitchen omega-3 acid ethyl esters (LOVAZA) 1 g capsule TAKE 1 CAPSULES BY MOUTH TWICE DAILY  . rosuvastatin (CRESTOR) 5 MG tablet Take 1 tablet (5 mg total) by mouth daily.   No facility-administered encounter medications on file as of 10/14/2019.    Goals Addressed   None     Follow Up Plan: {CCM SW FOLLOW UP PLAN:22232}  SIGNATURE

## 2019-10-15 NOTE — Chronic Care Management (AMB) (Signed)
  Chronic Care Management   Social Work Note  10/15/2019 Name: Felicia Robinson MRN: [id number] DOB: 1945/08/26  Felicia Robinson is a 75 y.o. year old female who sees Steele Sizer, MD for primary care. The CCM team was consulted for assistance with Community Resources  to assist with ADL's.  Phone call to patient to introduce CCM services and to assess for community resource needs. Patient declined CCM services stating that her orthopedic doctor's office has arranged home health services for her and that someone would be out tomorrow. Per patient, she has no  needs at this time.  SDOH (Social Determinants of Health) assessments performed: No     Outpatient Encounter Medications as of 10/14/2019  Medication Sig  . allopurinol (ZYLOPRIM) 100 MG tablet Take 1 tablet (100 mg total) by mouth 2 (two) times daily.  Marland Kitchen ALPRAZolam (XANAX) 0.25 MG tablet Take 0.25 mg by mouth 2 (two) times daily as needed for anxiety.  Marland Kitchen aspirin 81 MG chewable tablet Chew 81 mg by mouth daily.  . busPIRone (BUSPAR) 5 MG tablet Take 1 tablet (5 mg total) by mouth 3 (three) times daily as needed.  . calcitRIOL (ROCALTROL) 0.25 MCG capsule Take 0.25 capsules by mouth 2 (two) times daily.  . Cyanocobalamin (B-12) 1000 MCG SUBL Place 1 tablet under the tongue 3 (three) times a week.  . DULoxetine (CYMBALTA) 60 MG capsule Take 1 capsule (60 mg total) by mouth daily.  Marland Kitchen ibuprofen (ADVIL) 200 MG tablet Take 200 mg by mouth every 6 (six) hours as needed.  . K Phos Mono-Sod Phos Di & Mono (K-PHOS-NEUTRAL) [phone] MG TABS Take 250 mg by mouth daily.   . Nebivolol HCl (BYSTOLIC) 20 MG TABS Take 1 tablet (20 mg total) by mouth daily.  Marland Kitchen olmesartan (BENICAR) 40 MG tablet Take 1 tablet (40 mg total) by mouth daily.  Marland Kitchen omega-3 acid ethyl esters (LOVAZA) 1 g capsule TAKE 1 CAPSULES BY MOUTH TWICE DAILY  . rosuvastatin (CRESTOR) 5 MG tablet Take 1 tablet (5 mg total) by mouth daily.   No facility-administered encounter medications  on file as of 10/14/2019.    Goals Addressed   None     Follow Up Plan: Client will call this social worker with any additional needs that may arise in the future  [place], [place] Worker  [facility] [phone]

## 2019-10-18 DIAGNOSIS — M419 Scoliosis, unspecified: Secondary | ICD-10-CM | POA: Diagnosis not present

## 2019-10-18 DIAGNOSIS — E550 Rickets, active: Secondary | ICD-10-CM | POA: Diagnosis not present

## 2019-10-18 DIAGNOSIS — M1991 Primary osteoarthritis, unspecified site: Secondary | ICD-10-CM | POA: Diagnosis not present

## 2019-10-18 DIAGNOSIS — S82302D Unspecified fracture of lower end of left tibia, subsequent encounter for closed fracture with routine healing: Secondary | ICD-10-CM | POA: Diagnosis not present

## 2019-10-18 DIAGNOSIS — Z87891 Personal history of nicotine dependence: Secondary | ICD-10-CM | POA: Diagnosis not present

## 2019-10-18 DIAGNOSIS — M109 Gout, unspecified: Secondary | ICD-10-CM | POA: Diagnosis not present

## 2019-10-18 DIAGNOSIS — Z853 Personal history of malignant neoplasm of breast: Secondary | ICD-10-CM | POA: Diagnosis not present

## 2019-10-18 DIAGNOSIS — Z993 Dependence on wheelchair: Secondary | ICD-10-CM | POA: Diagnosis not present

## 2019-10-18 DIAGNOSIS — I10 Essential (primary) hypertension: Secondary | ICD-10-CM | POA: Diagnosis not present

## 2019-10-20 DIAGNOSIS — M1991 Primary osteoarthritis, unspecified site: Secondary | ICD-10-CM | POA: Diagnosis not present

## 2019-10-20 DIAGNOSIS — Z853 Personal history of malignant neoplasm of breast: Secondary | ICD-10-CM | POA: Diagnosis not present

## 2019-10-20 DIAGNOSIS — M109 Gout, unspecified: Secondary | ICD-10-CM | POA: Diagnosis not present

## 2019-10-20 DIAGNOSIS — Z993 Dependence on wheelchair: Secondary | ICD-10-CM | POA: Diagnosis not present

## 2019-10-20 DIAGNOSIS — E550 Rickets, active: Secondary | ICD-10-CM | POA: Diagnosis not present

## 2019-10-20 DIAGNOSIS — S82302D Unspecified fracture of lower end of left tibia, subsequent encounter for closed fracture with routine healing: Secondary | ICD-10-CM | POA: Diagnosis not present

## 2019-10-20 DIAGNOSIS — M419 Scoliosis, unspecified: Secondary | ICD-10-CM | POA: Diagnosis not present

## 2019-10-20 DIAGNOSIS — Z87891 Personal history of nicotine dependence: Secondary | ICD-10-CM | POA: Diagnosis not present

## 2019-10-20 DIAGNOSIS — I10 Essential (primary) hypertension: Secondary | ICD-10-CM | POA: Diagnosis not present

## 2019-10-25 DIAGNOSIS — Z853 Personal history of malignant neoplasm of breast: Secondary | ICD-10-CM | POA: Diagnosis not present

## 2019-10-25 DIAGNOSIS — M419 Scoliosis, unspecified: Secondary | ICD-10-CM | POA: Diagnosis not present

## 2019-10-25 DIAGNOSIS — Z87891 Personal history of nicotine dependence: Secondary | ICD-10-CM | POA: Diagnosis not present

## 2019-10-25 DIAGNOSIS — Z993 Dependence on wheelchair: Secondary | ICD-10-CM | POA: Diagnosis not present

## 2019-10-25 DIAGNOSIS — M109 Gout, unspecified: Secondary | ICD-10-CM | POA: Diagnosis not present

## 2019-10-25 DIAGNOSIS — S82302D Unspecified fracture of lower end of left tibia, subsequent encounter for closed fracture with routine healing: Secondary | ICD-10-CM | POA: Diagnosis not present

## 2019-10-25 DIAGNOSIS — I10 Essential (primary) hypertension: Secondary | ICD-10-CM | POA: Diagnosis not present

## 2019-10-25 DIAGNOSIS — E550 Rickets, active: Secondary | ICD-10-CM | POA: Diagnosis not present

## 2019-10-25 DIAGNOSIS — M1991 Primary osteoarthritis, unspecified site: Secondary | ICD-10-CM | POA: Diagnosis not present

## 2019-10-27 DIAGNOSIS — Z87891 Personal history of nicotine dependence: Secondary | ICD-10-CM | POA: Diagnosis not present

## 2019-10-27 DIAGNOSIS — M419 Scoliosis, unspecified: Secondary | ICD-10-CM | POA: Diagnosis not present

## 2019-10-27 DIAGNOSIS — I10 Essential (primary) hypertension: Secondary | ICD-10-CM | POA: Diagnosis not present

## 2019-10-27 DIAGNOSIS — S82302D Unspecified fracture of lower end of left tibia, subsequent encounter for closed fracture with routine healing: Secondary | ICD-10-CM | POA: Diagnosis not present

## 2019-10-27 DIAGNOSIS — Z853 Personal history of malignant neoplasm of breast: Secondary | ICD-10-CM | POA: Diagnosis not present

## 2019-10-27 DIAGNOSIS — E550 Rickets, active: Secondary | ICD-10-CM | POA: Diagnosis not present

## 2019-10-27 DIAGNOSIS — Z993 Dependence on wheelchair: Secondary | ICD-10-CM | POA: Diagnosis not present

## 2019-10-27 DIAGNOSIS — M1991 Primary osteoarthritis, unspecified site: Secondary | ICD-10-CM | POA: Diagnosis not present

## 2019-10-27 DIAGNOSIS — M109 Gout, unspecified: Secondary | ICD-10-CM | POA: Diagnosis not present

## 2019-10-28 DIAGNOSIS — M109 Gout, unspecified: Secondary | ICD-10-CM | POA: Diagnosis not present

## 2019-10-28 DIAGNOSIS — S82302D Unspecified fracture of lower end of left tibia, subsequent encounter for closed fracture with routine healing: Secondary | ICD-10-CM | POA: Diagnosis not present

## 2019-10-28 DIAGNOSIS — M419 Scoliosis, unspecified: Secondary | ICD-10-CM | POA: Diagnosis not present

## 2019-10-28 DIAGNOSIS — E550 Rickets, active: Secondary | ICD-10-CM | POA: Diagnosis not present

## 2019-10-28 DIAGNOSIS — Z993 Dependence on wheelchair: Secondary | ICD-10-CM | POA: Diagnosis not present

## 2019-10-28 DIAGNOSIS — M1991 Primary osteoarthritis, unspecified site: Secondary | ICD-10-CM | POA: Diagnosis not present

## 2019-10-28 DIAGNOSIS — I10 Essential (primary) hypertension: Secondary | ICD-10-CM | POA: Diagnosis not present

## 2019-10-28 DIAGNOSIS — Z853 Personal history of malignant neoplasm of breast: Secondary | ICD-10-CM | POA: Diagnosis not present

## 2019-10-28 DIAGNOSIS — Z87891 Personal history of nicotine dependence: Secondary | ICD-10-CM | POA: Diagnosis not present

## 2019-11-01 DIAGNOSIS — Z87891 Personal history of nicotine dependence: Secondary | ICD-10-CM | POA: Diagnosis not present

## 2019-11-01 DIAGNOSIS — I10 Essential (primary) hypertension: Secondary | ICD-10-CM | POA: Diagnosis not present

## 2019-11-01 DIAGNOSIS — E550 Rickets, active: Secondary | ICD-10-CM | POA: Diagnosis not present

## 2019-11-01 DIAGNOSIS — S82302D Unspecified fracture of lower end of left tibia, subsequent encounter for closed fracture with routine healing: Secondary | ICD-10-CM | POA: Diagnosis not present

## 2019-11-01 DIAGNOSIS — M1991 Primary osteoarthritis, unspecified site: Secondary | ICD-10-CM | POA: Diagnosis not present

## 2019-11-01 DIAGNOSIS — Z853 Personal history of malignant neoplasm of breast: Secondary | ICD-10-CM | POA: Diagnosis not present

## 2019-11-01 DIAGNOSIS — M419 Scoliosis, unspecified: Secondary | ICD-10-CM | POA: Diagnosis not present

## 2019-11-01 DIAGNOSIS — Z993 Dependence on wheelchair: Secondary | ICD-10-CM | POA: Diagnosis not present

## 2019-11-01 DIAGNOSIS — M109 Gout, unspecified: Secondary | ICD-10-CM | POA: Diagnosis not present

## 2019-11-03 DIAGNOSIS — Z993 Dependence on wheelchair: Secondary | ICD-10-CM | POA: Diagnosis not present

## 2019-11-03 DIAGNOSIS — M419 Scoliosis, unspecified: Secondary | ICD-10-CM | POA: Diagnosis not present

## 2019-11-03 DIAGNOSIS — Z853 Personal history of malignant neoplasm of breast: Secondary | ICD-10-CM | POA: Diagnosis not present

## 2019-11-03 DIAGNOSIS — I10 Essential (primary) hypertension: Secondary | ICD-10-CM | POA: Diagnosis not present

## 2019-11-03 DIAGNOSIS — E550 Rickets, active: Secondary | ICD-10-CM | POA: Diagnosis not present

## 2019-11-03 DIAGNOSIS — M109 Gout, unspecified: Secondary | ICD-10-CM | POA: Diagnosis not present

## 2019-11-03 DIAGNOSIS — Z87891 Personal history of nicotine dependence: Secondary | ICD-10-CM | POA: Diagnosis not present

## 2019-11-03 DIAGNOSIS — S82302D Unspecified fracture of lower end of left tibia, subsequent encounter for closed fracture with routine healing: Secondary | ICD-10-CM | POA: Diagnosis not present

## 2019-11-03 DIAGNOSIS — M1991 Primary osteoarthritis, unspecified site: Secondary | ICD-10-CM | POA: Diagnosis not present

## 2019-11-07 ENCOUNTER — Other Ambulatory Visit: Payer: Self-pay

## 2019-11-07 ENCOUNTER — Ambulatory Visit: Payer: Medicare PPO | Admitting: Family Medicine

## 2019-11-07 ENCOUNTER — Encounter: Payer: Self-pay | Admitting: Family Medicine

## 2019-11-07 VITALS — BP 136/90 | HR 57 | Temp 97.7°F | Ht <= 58 in

## 2019-11-07 DIAGNOSIS — N3090 Cystitis, unspecified without hematuria: Secondary | ICD-10-CM

## 2019-11-07 DIAGNOSIS — R3915 Urgency of urination: Secondary | ICD-10-CM | POA: Diagnosis not present

## 2019-11-07 LAB — POC URINALSYSI DIPSTICK (AUTOMATED)
Blood, UA: NEGATIVE
Glucose, UA: NEGATIVE
Ketones, UA: NEGATIVE
Nitrite, UA: NEGATIVE
Protein, UA: POSITIVE — AB
Spec Grav, UA: 1.025 (ref 1.010–1.025)
Urobilinogen, UA: 0.2 E.U./dL
pH, UA: 5.5 (ref 5.0–8.0)

## 2019-11-07 MED ORDER — SULFAMETHOXAZOLE-TRIMETHOPRIM 800-160 MG PO TABS: 1.0000 | ORAL_TABLET | Freq: Two times a day (BID) | ORAL | 0 refills | Status: AC

## 2019-11-07 NOTE — Patient Instructions (Signed)
Good to see you today  Please follow up if you have any fever/ chills, abdominal pain, nausea/ vomiting.   Look at [employer]- gentle chair yoga

## 2019-11-07 NOTE — Progress Notes (Signed)
Subjective:    Patient ID: Felicia Robinson, female    DOB: [birth year], 74 y.o.   MRN: [id number]  HPI Chief Complaint  Patient presents with  . Urinary Tract Infection    Urinary frequency, urgency, dysuria started recently.   This is a 74 yo female, patient of Dr. Ancil Boozer, show presents today with above cc.  Has appointment to establish care upcoming. She has recent fibula fracture and has left cast to thigh. In wheeschair.   Last CPE- AWV- 11/16/2018 Mammo- 10/28/2018 Pap- aged out Colonoscopy- 07/18/2010 Tdap- 09/2009 Flu- annual  Patient with 7-10 days of intermittent lower abdominal pain, history of incontinence, recent urgency. Urine hesitancy. No hematuria. Drinks less than 6 glasses of water daily. Wears incontinence pads and/ or briefs. Changes pad 3x/ day. Has never seen urologist. No nausea/ vomiting/ fever.  She currently has left leg cast for tibial fracture. Hoping to get removed in next week or so. Non weight bearing.    Review of Systems Per HPI    Objective:   Physical Exam Physical Exam  Constitutional: She is oriented to person, place, and time. She appears well-developed and well-nourished. No distress.  HENT:  Head: Normocephalic and atraumatic.  Cardiovascular: Normal rate, regular rhythm and normal heart sounds.   Pulmonary/Chest: Effort normal and breath sounds normal.  Abdominal: Soft. She exhibits no distension. There is no tenderness. There is no rebound, no guarding and no CVA tenderness.  Neurological: She is alert and oriented to person, place, and time.  Skin: Skin is warm and dry. She is not diaphoretic.  Psychiatric: She has a normal mood and affect. Her behavior is normal. Judgment and thought content normal.  Vitals reviewed.   BP 136/90 (BP Location: Right Arm, Patient Position: Sitting, Cuff Size: Normal)   Pulse (!) 57   Temp 97.7 F (36.5 C)   Ht 4\' 7"  (1.397 m)   SpO2 97%   BMI 27.89 kg/m  Results for orders placed or performed in  visit on 11/07/19  POCT Urinalysis Dipstick (Automated)  Result Value Ref Range   Color, UA yellow    Clarity, UA cloudy    Glucose, UA Negative Negative   Bilirubin, UA 1+    Ketones, UA negative    Spec Grav, UA 1.025 1.010 - 1.025   Blood, UA negative    pH, UA 5.5 5.0 - 8.0   Protein, UA Positive (A) Negative   Urobilinogen, UA 0.2 0.2 or 1.0 E.U./dL   Nitrite, UA negative    Leukocytes, UA Moderate (2+) (A) Negative   Depression screen [facility] 2/9 10/11/2019 08/17/2019 06/14/2019 02/16/2019 11/16/2018  Decreased Interest 1 1 1 1 1   Down, Depressed, Hopeless 1 1 1 1 1   PHQ - 2 Score 2 2 2 2 2   Altered sleeping 3 2 0 0 1  Tired, decreased energy 3 2 1 1 1   Change in appetite 0 1 0 0 0  Feeling bad or failure about yourself  1 1 1  0 0  Trouble concentrating 0 1 0 0 1  Moving slowly or fidgety/restless 0 0 0 1 1  Suicidal thoughts 0 0 - 0 0  PHQ-9 Score 9 9 4 4 6   Difficult doing work/chores Somewhat difficult Somewhat difficult Somewhat difficult Somewhat difficult Not difficult at all       Assessment & Plan:  1. Urinary urgency - POCT Urinalysis Dipstick (Automated)  2. Cystitis - Provided written and verbal information regarding diagnosis and treatment. - sulfamethoxazole-trimethoprim (BACTRIM  DS) 800-160 MG tablet; Take 1 tablet by mouth 2 (two) times daily for 3 days.  Dispense: 6 tablet; Refill: 0 - Urine Culture  This visit occurred during the SARS-CoV-2 public health emergency.  Safety protocols were in place, including screening questions prior to the visit, additional usage of staff PPE, and extensive cleaning of exam room while observing appropriate contact time as indicated for disinfecting solutions.    Clarene Reamer, FNP-BC  [facility] at [facility], [place] [facility]  11/07/2019 4:19 PM

## 2019-11-08 ENCOUNTER — Encounter: Payer: Self-pay | Admitting: Family Medicine

## 2019-11-08 DIAGNOSIS — S82302D Unspecified fracture of lower end of left tibia, subsequent encounter for closed fracture with routine healing: Secondary | ICD-10-CM | POA: Diagnosis not present

## 2019-11-08 DIAGNOSIS — Z993 Dependence on wheelchair: Secondary | ICD-10-CM | POA: Diagnosis not present

## 2019-11-08 DIAGNOSIS — M109 Gout, unspecified: Secondary | ICD-10-CM | POA: Diagnosis not present

## 2019-11-08 DIAGNOSIS — E550 Rickets, active: Secondary | ICD-10-CM | POA: Diagnosis not present

## 2019-11-08 DIAGNOSIS — M419 Scoliosis, unspecified: Secondary | ICD-10-CM | POA: Diagnosis not present

## 2019-11-08 DIAGNOSIS — Z853 Personal history of malignant neoplasm of breast: Secondary | ICD-10-CM | POA: Diagnosis not present

## 2019-11-08 DIAGNOSIS — Z87891 Personal history of nicotine dependence: Secondary | ICD-10-CM | POA: Diagnosis not present

## 2019-11-08 DIAGNOSIS — M1991 Primary osteoarthritis, unspecified site: Secondary | ICD-10-CM | POA: Diagnosis not present

## 2019-11-08 DIAGNOSIS — I10 Essential (primary) hypertension: Secondary | ICD-10-CM | POA: Diagnosis not present

## 2019-11-09 LAB — URINE CULTURE
MICRO NUMBER:: 10721291
SPECIMEN QUALITY:: ADEQUATE

## 2019-11-10 DIAGNOSIS — E550 Rickets, active: Secondary | ICD-10-CM | POA: Diagnosis not present

## 2019-11-10 DIAGNOSIS — M109 Gout, unspecified: Secondary | ICD-10-CM | POA: Diagnosis not present

## 2019-11-10 DIAGNOSIS — Z853 Personal history of malignant neoplasm of breast: Secondary | ICD-10-CM | POA: Diagnosis not present

## 2019-11-10 DIAGNOSIS — S82302D Unspecified fracture of lower end of left tibia, subsequent encounter for closed fracture with routine healing: Secondary | ICD-10-CM | POA: Diagnosis not present

## 2019-11-10 DIAGNOSIS — M419 Scoliosis, unspecified: Secondary | ICD-10-CM | POA: Diagnosis not present

## 2019-11-10 DIAGNOSIS — Z993 Dependence on wheelchair: Secondary | ICD-10-CM | POA: Diagnosis not present

## 2019-11-10 DIAGNOSIS — Z87891 Personal history of nicotine dependence: Secondary | ICD-10-CM | POA: Diagnosis not present

## 2019-11-10 DIAGNOSIS — I10 Essential (primary) hypertension: Secondary | ICD-10-CM | POA: Diagnosis not present

## 2019-11-10 DIAGNOSIS — M1991 Primary osteoarthritis, unspecified site: Secondary | ICD-10-CM | POA: Diagnosis not present

## 2019-11-11 DIAGNOSIS — S82235D Nondisplaced oblique fracture of shaft of left tibia, subsequent encounter for closed fracture with routine healing: Secondary | ICD-10-CM | POA: Diagnosis not present

## 2019-11-15 ENCOUNTER — Telehealth: Payer: Self-pay

## 2019-11-15 NOTE — Telephone Encounter (Signed)
Leroy Sea pts son (DPR signed) said that pt had appt with Glenda Chroman FNP on 11/07/19 with irritability and confusion; after finishing medication pt did not have irritability and confusion but pt restarted with irritability and confusion again today. Pt has never had fever, chills, abd pain, or N&V. Leroy Sea wants to know if can get medication sent to [facility] [address] [facility] or what needs to be done. Brad request cb on 11/16/19. UC & ED precautions given and Brad voiced understanding.

## 2019-11-16 DIAGNOSIS — Z993 Dependence on wheelchair: Secondary | ICD-10-CM | POA: Diagnosis not present

## 2019-11-16 DIAGNOSIS — M419 Scoliosis, unspecified: Secondary | ICD-10-CM | POA: Diagnosis not present

## 2019-11-16 DIAGNOSIS — Z87891 Personal history of nicotine dependence: Secondary | ICD-10-CM | POA: Diagnosis not present

## 2019-11-16 DIAGNOSIS — Z853 Personal history of malignant neoplasm of breast: Secondary | ICD-10-CM | POA: Diagnosis not present

## 2019-11-16 DIAGNOSIS — M1991 Primary osteoarthritis, unspecified site: Secondary | ICD-10-CM | POA: Diagnosis not present

## 2019-11-16 DIAGNOSIS — E550 Rickets, active: Secondary | ICD-10-CM | POA: Diagnosis not present

## 2019-11-16 DIAGNOSIS — M109 Gout, unspecified: Secondary | ICD-10-CM | POA: Diagnosis not present

## 2019-11-16 DIAGNOSIS — S82302D Unspecified fracture of lower end of left tibia, subsequent encounter for closed fracture with routine healing: Secondary | ICD-10-CM | POA: Diagnosis not present

## 2019-11-16 DIAGNOSIS — I10 Essential (primary) hypertension: Secondary | ICD-10-CM | POA: Diagnosis not present

## 2019-11-16 NOTE — Telephone Encounter (Addendum)
Patient's son Leroy Sea called the office to follow-up on the message that he had left yesterday. Leroy Sea was relayed the message sent by Tor Netters NP. Leroy Sea stated that his mom was given an antibiotic for 3 days. Leroy Sea stated that she seemed to be doing fine while on the antibiotic. Leroy Sea stated that his mom is back acting the way she was before she started the last antibiotic for the UTI. Patient's son stated that he thinks that she may have another UTI. Patient scheduled to see Dr. Diona Browner tomorrow 11/17/19 at 12:00. Leroy Sea was given ER/Urgent Care precautions and he verbalized understanding.Patient had a negative covid screening. Leroy Sea stated that he plans on coming with his mom for the appointment tomorrow.

## 2019-11-16 NOTE — Telephone Encounter (Signed)
Please call patient's son. I saw patient for an acute visit for UTI. The patient was seen alone. There was no mention of irritability or confusion. These symptoms are new to me. I recommend she be seen in the office this week. I am out of the office, please see if she can be scheduled with another provider.

## 2019-11-16 NOTE — Telephone Encounter (Signed)
Noted  

## 2019-11-17 ENCOUNTER — Ambulatory Visit: Payer: Medicare PPO | Admitting: Family Medicine

## 2019-11-17 ENCOUNTER — Ambulatory Visit: Payer: Medicare Other

## 2019-11-17 DIAGNOSIS — Z87891 Personal history of nicotine dependence: Secondary | ICD-10-CM | POA: Diagnosis not present

## 2019-11-17 DIAGNOSIS — Z853 Personal history of malignant neoplasm of breast: Secondary | ICD-10-CM | POA: Diagnosis not present

## 2019-11-17 DIAGNOSIS — E550 Rickets, active: Secondary | ICD-10-CM | POA: Diagnosis not present

## 2019-11-17 DIAGNOSIS — Z993 Dependence on wheelchair: Secondary | ICD-10-CM | POA: Diagnosis not present

## 2019-11-17 DIAGNOSIS — M419 Scoliosis, unspecified: Secondary | ICD-10-CM | POA: Diagnosis not present

## 2019-11-17 DIAGNOSIS — S82302D Unspecified fracture of lower end of left tibia, subsequent encounter for closed fracture with routine healing: Secondary | ICD-10-CM | POA: Diagnosis not present

## 2019-11-17 DIAGNOSIS — M109 Gout, unspecified: Secondary | ICD-10-CM | POA: Diagnosis not present

## 2019-11-17 DIAGNOSIS — M1991 Primary osteoarthritis, unspecified site: Secondary | ICD-10-CM | POA: Diagnosis not present

## 2019-11-17 DIAGNOSIS — I10 Essential (primary) hypertension: Secondary | ICD-10-CM | POA: Diagnosis not present

## 2019-11-17 NOTE — Telephone Encounter (Signed)
Noted  

## 2019-11-18 DIAGNOSIS — Z993 Dependence on wheelchair: Secondary | ICD-10-CM | POA: Diagnosis not present

## 2019-11-18 DIAGNOSIS — M419 Scoliosis, unspecified: Secondary | ICD-10-CM | POA: Diagnosis not present

## 2019-11-18 DIAGNOSIS — M109 Gout, unspecified: Secondary | ICD-10-CM | POA: Diagnosis not present

## 2019-11-18 DIAGNOSIS — Z87891 Personal history of nicotine dependence: Secondary | ICD-10-CM | POA: Diagnosis not present

## 2019-11-18 DIAGNOSIS — S82302D Unspecified fracture of lower end of left tibia, subsequent encounter for closed fracture with routine healing: Secondary | ICD-10-CM | POA: Diagnosis not present

## 2019-11-18 DIAGNOSIS — I10 Essential (primary) hypertension: Secondary | ICD-10-CM | POA: Diagnosis not present

## 2019-11-18 DIAGNOSIS — Z853 Personal history of malignant neoplasm of breast: Secondary | ICD-10-CM | POA: Diagnosis not present

## 2019-11-18 DIAGNOSIS — E550 Rickets, active: Secondary | ICD-10-CM | POA: Diagnosis not present

## 2019-11-18 DIAGNOSIS — M1991 Primary osteoarthritis, unspecified site: Secondary | ICD-10-CM | POA: Diagnosis not present

## 2019-11-20 DIAGNOSIS — S82302D Unspecified fracture of lower end of left tibia, subsequent encounter for closed fracture with routine healing: Secondary | ICD-10-CM | POA: Diagnosis not present

## 2019-11-22 DIAGNOSIS — E550 Rickets, active: Secondary | ICD-10-CM | POA: Diagnosis not present

## 2019-11-22 DIAGNOSIS — S82302D Unspecified fracture of lower end of left tibia, subsequent encounter for closed fracture with routine healing: Secondary | ICD-10-CM | POA: Diagnosis not present

## 2019-11-22 DIAGNOSIS — M419 Scoliosis, unspecified: Secondary | ICD-10-CM | POA: Diagnosis not present

## 2019-11-22 DIAGNOSIS — Z87891 Personal history of nicotine dependence: Secondary | ICD-10-CM | POA: Diagnosis not present

## 2019-11-22 DIAGNOSIS — M109 Gout, unspecified: Secondary | ICD-10-CM | POA: Diagnosis not present

## 2019-11-22 DIAGNOSIS — Z853 Personal history of malignant neoplasm of breast: Secondary | ICD-10-CM | POA: Diagnosis not present

## 2019-11-22 DIAGNOSIS — I10 Essential (primary) hypertension: Secondary | ICD-10-CM | POA: Diagnosis not present

## 2019-11-22 DIAGNOSIS — M1991 Primary osteoarthritis, unspecified site: Secondary | ICD-10-CM | POA: Diagnosis not present

## 2019-11-22 DIAGNOSIS — Z993 Dependence on wheelchair: Secondary | ICD-10-CM | POA: Diagnosis not present

## 2019-11-30 ENCOUNTER — Other Ambulatory Visit: Payer: Self-pay

## 2019-11-30 ENCOUNTER — Ambulatory Visit: Payer: Medicare PPO | Admitting: Family Medicine

## 2019-11-30 ENCOUNTER — Encounter: Payer: Self-pay | Admitting: Family Medicine

## 2019-11-30 VITALS — BP 154/94 | HR 60 | Ht <= 58 in

## 2019-11-30 DIAGNOSIS — S82142S Displaced bicondylar fracture of left tibia, sequela: Secondary | ICD-10-CM

## 2019-11-30 DIAGNOSIS — I10 Essential (primary) hypertension: Secondary | ICD-10-CM | POA: Diagnosis not present

## 2019-11-30 DIAGNOSIS — Z7689 Persons encountering health services in other specified circumstances: Secondary | ICD-10-CM

## 2019-11-30 NOTE — Progress Notes (Signed)
Subjective:    Patient ID: Felicia Robinson, female    DOB: October 20, 1945, 74 y.o.   MRN: [id number]  HPI This is a 74 yo female who presents today to establish care. Was seen last month for acute visit for UTI.   Last CPE- 11/16/2018 Mammo- 10/28/2018 Pap- no longer screening Colonoscopy- 07/18/2010 Tdap- 09/2009 Flu- annual Covid 19 vaccine- fully vaccinated Eye- regular Dental- regular Exercise-currently severely limited due to nonweightbearing status due to left leg in cast  She has been doing okay in general. Some ups and downs with her mood due to her immobility. She has follow-up next week and hopes to get her cast removed and be able to do some weightbearing.  History of hypertension. She reports that she has not taken her blood pressure medicine yet today.  Review of Systems She denies headache, visual change, chest pain, shortness of breath, leg swelling    Objective:   Physical Exam Physical Exam  Constitutional: Oriented to person, place, and time. Appears well-developed and well-nourished. Seated in wheelchair. Left lower leg with cast. HENT:  Head: Normocephalic and atraumatic.  Eyes: Conjunctivae are normal.  Neck: Normal range of motion. Neck supple.  Cardiovascular: Normal rate, regular rhythm and normal heart sounds.   Pulmonary/Chest: Effort normal and breath sounds normal.  Musculoskeletal: No lower extremity edema.   Neurological: Alert and oriented to person, place, and time.  Skin: Skin is warm and dry.  Psychiatric: Normal mood and affect. Behavior is normal. Judgment and thought content normal.  Vitals reviewed.    BP (!) 176/102   Pulse 60   Ht 4\' 7"  (1.397 m)   SpO2 97%   BMI 27.89 kg/m  Wt Readings from Last 3 Encounters:  10/11/19 120 lb (54.4 kg)  09/28/19 120 lb (54.4 kg)  08/17/19 125 lb 1.6 oz (56.7 kg)   BP Readings from Last 3 Encounters:  11/30/19 (!) 154/94  11/07/19 136/90  09/28/19 (!) 192/99        Assessment & Plan:  1.  Encounter to establish care -Reviewed available records from EMR as well as KP in data  2. Essential hypertension -Elevated today, improved with recheck. She has not had her medication yet today. -She has ability to check blood pressure at home, and will check twice a week and keep a log -  Patient Instructions  I recommend you get Shingrix vaccine at your pharmacy when able  Please increase your buspirone to twice a day  Please take your blood pressure twice a week and bring readings with you  Follow up in 3 months    3. Closed displaced bicondylar fracture of left tibia, sequela -Has struggled with some worsening of her mood due to immobility. She has only been taking her buspirone once a day and we discussed her increasing this to twice a day. -Hopefully will get her cast removed next week and be able to begin weightbearing -Discussed expectations of physical therapy and likelihood of severe deconditioning and leg weakness. -Encouraged her to increase her seated exercises in anticipation of increased mobility  -Follow-up in 3 months  This visit occurred during the SARS-CoV-2 public health emergency.  Safety protocols were in place, including screening questions prior to the visit, additional usage of staff PPE, and extensive cleaning of exam room while observing appropriate contact time as indicated for disinfecting solutions.      Clarene Reamer, FNP-BC  [facility] [place] [facility] at [facility], [place] [facility]  12/01/2019 11:16 AM

## 2019-11-30 NOTE — Patient Instructions (Addendum)
I recommend you get Shingrix vaccine at your pharmacy when able  Please increase your buspirone to twice a day  Please take your blood pressure twice a week and bring readings with you  Follow up in 3 months

## 2019-12-09 DIAGNOSIS — S82235D Nondisplaced oblique fracture of shaft of left tibia, subsequent encounter for closed fracture with routine healing: Secondary | ICD-10-CM | POA: Diagnosis not present

## 2019-12-15 DIAGNOSIS — S82302D Unspecified fracture of lower end of left tibia, subsequent encounter for closed fracture with routine healing: Secondary | ICD-10-CM | POA: Diagnosis not present

## 2019-12-15 DIAGNOSIS — Z87891 Personal history of nicotine dependence: Secondary | ICD-10-CM | POA: Diagnosis not present

## 2019-12-15 DIAGNOSIS — M109 Gout, unspecified: Secondary | ICD-10-CM | POA: Diagnosis not present

## 2019-12-15 DIAGNOSIS — M419 Scoliosis, unspecified: Secondary | ICD-10-CM | POA: Diagnosis not present

## 2019-12-15 DIAGNOSIS — Z853 Personal history of malignant neoplasm of breast: Secondary | ICD-10-CM | POA: Diagnosis not present

## 2019-12-15 DIAGNOSIS — I10 Essential (primary) hypertension: Secondary | ICD-10-CM | POA: Diagnosis not present

## 2019-12-15 DIAGNOSIS — M1991 Primary osteoarthritis, unspecified site: Secondary | ICD-10-CM | POA: Diagnosis not present

## 2019-12-15 DIAGNOSIS — Z993 Dependence on wheelchair: Secondary | ICD-10-CM | POA: Diagnosis not present

## 2019-12-15 DIAGNOSIS — E550 Rickets, active: Secondary | ICD-10-CM | POA: Diagnosis not present

## 2019-12-17 DIAGNOSIS — S82302D Unspecified fracture of lower end of left tibia, subsequent encounter for closed fracture with routine healing: Secondary | ICD-10-CM | POA: Diagnosis not present

## 2019-12-17 DIAGNOSIS — Z853 Personal history of malignant neoplasm of breast: Secondary | ICD-10-CM | POA: Diagnosis not present

## 2019-12-17 DIAGNOSIS — I10 Essential (primary) hypertension: Secondary | ICD-10-CM | POA: Diagnosis not present

## 2019-12-17 DIAGNOSIS — M109 Gout, unspecified: Secondary | ICD-10-CM | POA: Diagnosis not present

## 2019-12-17 DIAGNOSIS — Z87891 Personal history of nicotine dependence: Secondary | ICD-10-CM | POA: Diagnosis not present

## 2019-12-17 DIAGNOSIS — E550 Rickets, active: Secondary | ICD-10-CM | POA: Diagnosis not present

## 2019-12-17 DIAGNOSIS — M1991 Primary osteoarthritis, unspecified site: Secondary | ICD-10-CM | POA: Diagnosis not present

## 2019-12-17 DIAGNOSIS — Z993 Dependence on wheelchair: Secondary | ICD-10-CM | POA: Diagnosis not present

## 2019-12-17 DIAGNOSIS — M419 Scoliosis, unspecified: Secondary | ICD-10-CM | POA: Diagnosis not present

## 2019-12-22 DIAGNOSIS — E550 Rickets, active: Secondary | ICD-10-CM | POA: Diagnosis not present

## 2019-12-22 DIAGNOSIS — M419 Scoliosis, unspecified: Secondary | ICD-10-CM | POA: Diagnosis not present

## 2019-12-22 DIAGNOSIS — Z993 Dependence on wheelchair: Secondary | ICD-10-CM | POA: Diagnosis not present

## 2019-12-22 DIAGNOSIS — S82302D Unspecified fracture of lower end of left tibia, subsequent encounter for closed fracture with routine healing: Secondary | ICD-10-CM | POA: Diagnosis not present

## 2019-12-22 DIAGNOSIS — M109 Gout, unspecified: Secondary | ICD-10-CM | POA: Diagnosis not present

## 2019-12-22 DIAGNOSIS — I10 Essential (primary) hypertension: Secondary | ICD-10-CM | POA: Diagnosis not present

## 2019-12-22 DIAGNOSIS — M1991 Primary osteoarthritis, unspecified site: Secondary | ICD-10-CM | POA: Diagnosis not present

## 2019-12-22 DIAGNOSIS — Z87891 Personal history of nicotine dependence: Secondary | ICD-10-CM | POA: Diagnosis not present

## 2019-12-22 DIAGNOSIS — Z853 Personal history of malignant neoplasm of breast: Secondary | ICD-10-CM | POA: Diagnosis not present

## 2019-12-27 DIAGNOSIS — Z853 Personal history of malignant neoplasm of breast: Secondary | ICD-10-CM | POA: Diagnosis not present

## 2019-12-27 DIAGNOSIS — M109 Gout, unspecified: Secondary | ICD-10-CM | POA: Diagnosis not present

## 2019-12-27 DIAGNOSIS — Z87891 Personal history of nicotine dependence: Secondary | ICD-10-CM | POA: Diagnosis not present

## 2019-12-27 DIAGNOSIS — M1991 Primary osteoarthritis, unspecified site: Secondary | ICD-10-CM | POA: Diagnosis not present

## 2019-12-27 DIAGNOSIS — M419 Scoliosis, unspecified: Secondary | ICD-10-CM | POA: Diagnosis not present

## 2019-12-27 DIAGNOSIS — Z993 Dependence on wheelchair: Secondary | ICD-10-CM | POA: Diagnosis not present

## 2019-12-27 DIAGNOSIS — I10 Essential (primary) hypertension: Secondary | ICD-10-CM | POA: Diagnosis not present

## 2019-12-27 DIAGNOSIS — E550 Rickets, active: Secondary | ICD-10-CM | POA: Diagnosis not present

## 2019-12-27 DIAGNOSIS — S82302D Unspecified fracture of lower end of left tibia, subsequent encounter for closed fracture with routine healing: Secondary | ICD-10-CM | POA: Diagnosis not present

## 2019-12-29 DIAGNOSIS — S82302D Unspecified fracture of lower end of left tibia, subsequent encounter for closed fracture with routine healing: Secondary | ICD-10-CM | POA: Diagnosis not present

## 2019-12-29 DIAGNOSIS — E550 Rickets, active: Secondary | ICD-10-CM | POA: Diagnosis not present

## 2019-12-29 DIAGNOSIS — Z853 Personal history of malignant neoplasm of breast: Secondary | ICD-10-CM | POA: Diagnosis not present

## 2019-12-29 DIAGNOSIS — M109 Gout, unspecified: Secondary | ICD-10-CM | POA: Diagnosis not present

## 2019-12-29 DIAGNOSIS — M1991 Primary osteoarthritis, unspecified site: Secondary | ICD-10-CM | POA: Diagnosis not present

## 2019-12-29 DIAGNOSIS — Z993 Dependence on wheelchair: Secondary | ICD-10-CM | POA: Diagnosis not present

## 2019-12-29 DIAGNOSIS — Z87891 Personal history of nicotine dependence: Secondary | ICD-10-CM | POA: Diagnosis not present

## 2019-12-29 DIAGNOSIS — I10 Essential (primary) hypertension: Secondary | ICD-10-CM | POA: Diagnosis not present

## 2019-12-29 DIAGNOSIS — M419 Scoliosis, unspecified: Secondary | ICD-10-CM | POA: Diagnosis not present

## 2020-01-02 DIAGNOSIS — Z853 Personal history of malignant neoplasm of breast: Secondary | ICD-10-CM | POA: Diagnosis not present

## 2020-01-02 DIAGNOSIS — M1991 Primary osteoarthritis, unspecified site: Secondary | ICD-10-CM | POA: Diagnosis not present

## 2020-01-02 DIAGNOSIS — M109 Gout, unspecified: Secondary | ICD-10-CM | POA: Diagnosis not present

## 2020-01-02 DIAGNOSIS — E550 Rickets, active: Secondary | ICD-10-CM | POA: Diagnosis not present

## 2020-01-02 DIAGNOSIS — I10 Essential (primary) hypertension: Secondary | ICD-10-CM | POA: Diagnosis not present

## 2020-01-02 DIAGNOSIS — Z87891 Personal history of nicotine dependence: Secondary | ICD-10-CM | POA: Diagnosis not present

## 2020-01-02 DIAGNOSIS — S82302D Unspecified fracture of lower end of left tibia, subsequent encounter for closed fracture with routine healing: Secondary | ICD-10-CM | POA: Diagnosis not present

## 2020-01-02 DIAGNOSIS — M419 Scoliosis, unspecified: Secondary | ICD-10-CM | POA: Diagnosis not present

## 2020-01-02 DIAGNOSIS — Z993 Dependence on wheelchair: Secondary | ICD-10-CM | POA: Diagnosis not present

## 2020-01-04 DIAGNOSIS — E550 Rickets, active: Secondary | ICD-10-CM | POA: Diagnosis not present

## 2020-01-04 DIAGNOSIS — S82302D Unspecified fracture of lower end of left tibia, subsequent encounter for closed fracture with routine healing: Secondary | ICD-10-CM | POA: Diagnosis not present

## 2020-01-04 DIAGNOSIS — M1991 Primary osteoarthritis, unspecified site: Secondary | ICD-10-CM | POA: Diagnosis not present

## 2020-01-04 DIAGNOSIS — I10 Essential (primary) hypertension: Secondary | ICD-10-CM | POA: Diagnosis not present

## 2020-01-04 DIAGNOSIS — Z993 Dependence on wheelchair: Secondary | ICD-10-CM | POA: Diagnosis not present

## 2020-01-04 DIAGNOSIS — M419 Scoliosis, unspecified: Secondary | ICD-10-CM | POA: Diagnosis not present

## 2020-01-04 DIAGNOSIS — M109 Gout, unspecified: Secondary | ICD-10-CM | POA: Diagnosis not present

## 2020-01-04 DIAGNOSIS — Z853 Personal history of malignant neoplasm of breast: Secondary | ICD-10-CM | POA: Diagnosis not present

## 2020-01-04 DIAGNOSIS — Z87891 Personal history of nicotine dependence: Secondary | ICD-10-CM | POA: Diagnosis not present

## 2020-01-06 DIAGNOSIS — M1712 Unilateral primary osteoarthritis, left knee: Secondary | ICD-10-CM | POA: Diagnosis not present

## 2020-01-06 DIAGNOSIS — S82302D Unspecified fracture of lower end of left tibia, subsequent encounter for closed fracture with routine healing: Secondary | ICD-10-CM | POA: Diagnosis not present

## 2020-01-06 DIAGNOSIS — S82235D Nondisplaced oblique fracture of shaft of left tibia, subsequent encounter for closed fracture with routine healing: Secondary | ICD-10-CM | POA: Diagnosis not present

## 2020-01-10 DIAGNOSIS — S82302D Unspecified fracture of lower end of left tibia, subsequent encounter for closed fracture with routine healing: Secondary | ICD-10-CM | POA: Diagnosis not present

## 2020-01-10 DIAGNOSIS — Z87891 Personal history of nicotine dependence: Secondary | ICD-10-CM | POA: Diagnosis not present

## 2020-01-10 DIAGNOSIS — I10 Essential (primary) hypertension: Secondary | ICD-10-CM | POA: Diagnosis not present

## 2020-01-10 DIAGNOSIS — Z853 Personal history of malignant neoplasm of breast: Secondary | ICD-10-CM | POA: Diagnosis not present

## 2020-01-10 DIAGNOSIS — M1991 Primary osteoarthritis, unspecified site: Secondary | ICD-10-CM | POA: Diagnosis not present

## 2020-01-10 DIAGNOSIS — M419 Scoliosis, unspecified: Secondary | ICD-10-CM | POA: Diagnosis not present

## 2020-01-10 DIAGNOSIS — E550 Rickets, active: Secondary | ICD-10-CM | POA: Diagnosis not present

## 2020-01-10 DIAGNOSIS — Z993 Dependence on wheelchair: Secondary | ICD-10-CM | POA: Diagnosis not present

## 2020-01-10 DIAGNOSIS — M109 Gout, unspecified: Secondary | ICD-10-CM | POA: Diagnosis not present

## 2020-01-13 DIAGNOSIS — E550 Rickets, active: Secondary | ICD-10-CM | POA: Diagnosis not present

## 2020-01-13 DIAGNOSIS — Z87891 Personal history of nicotine dependence: Secondary | ICD-10-CM | POA: Diagnosis not present

## 2020-01-13 DIAGNOSIS — I10 Essential (primary) hypertension: Secondary | ICD-10-CM | POA: Diagnosis not present

## 2020-01-13 DIAGNOSIS — S82302D Unspecified fracture of lower end of left tibia, subsequent encounter for closed fracture with routine healing: Secondary | ICD-10-CM | POA: Diagnosis not present

## 2020-01-13 DIAGNOSIS — M109 Gout, unspecified: Secondary | ICD-10-CM | POA: Diagnosis not present

## 2020-01-13 DIAGNOSIS — M419 Scoliosis, unspecified: Secondary | ICD-10-CM | POA: Diagnosis not present

## 2020-01-13 DIAGNOSIS — Z853 Personal history of malignant neoplasm of breast: Secondary | ICD-10-CM | POA: Diagnosis not present

## 2020-01-13 DIAGNOSIS — Z993 Dependence on wheelchair: Secondary | ICD-10-CM | POA: Diagnosis not present

## 2020-01-13 DIAGNOSIS — M1991 Primary osteoarthritis, unspecified site: Secondary | ICD-10-CM | POA: Diagnosis not present

## 2020-01-16 DIAGNOSIS — Z993 Dependence on wheelchair: Secondary | ICD-10-CM | POA: Diagnosis not present

## 2020-01-16 DIAGNOSIS — M1991 Primary osteoarthritis, unspecified site: Secondary | ICD-10-CM | POA: Diagnosis not present

## 2020-01-16 DIAGNOSIS — S82302D Unspecified fracture of lower end of left tibia, subsequent encounter for closed fracture with routine healing: Secondary | ICD-10-CM | POA: Diagnosis not present

## 2020-01-16 DIAGNOSIS — M419 Scoliosis, unspecified: Secondary | ICD-10-CM | POA: Diagnosis not present

## 2020-01-16 DIAGNOSIS — Z853 Personal history of malignant neoplasm of breast: Secondary | ICD-10-CM | POA: Diagnosis not present

## 2020-01-16 DIAGNOSIS — E550 Rickets, active: Secondary | ICD-10-CM | POA: Diagnosis not present

## 2020-01-16 DIAGNOSIS — M109 Gout, unspecified: Secondary | ICD-10-CM | POA: Diagnosis not present

## 2020-01-16 DIAGNOSIS — Z87891 Personal history of nicotine dependence: Secondary | ICD-10-CM | POA: Diagnosis not present

## 2020-01-16 DIAGNOSIS — I10 Essential (primary) hypertension: Secondary | ICD-10-CM | POA: Diagnosis not present

## 2020-01-19 DIAGNOSIS — M419 Scoliosis, unspecified: Secondary | ICD-10-CM | POA: Diagnosis not present

## 2020-01-19 DIAGNOSIS — Z853 Personal history of malignant neoplasm of breast: Secondary | ICD-10-CM | POA: Diagnosis not present

## 2020-01-19 DIAGNOSIS — S82302D Unspecified fracture of lower end of left tibia, subsequent encounter for closed fracture with routine healing: Secondary | ICD-10-CM | POA: Diagnosis not present

## 2020-01-19 DIAGNOSIS — E550 Rickets, active: Secondary | ICD-10-CM | POA: Diagnosis not present

## 2020-01-19 DIAGNOSIS — M109 Gout, unspecified: Secondary | ICD-10-CM | POA: Diagnosis not present

## 2020-01-19 DIAGNOSIS — I10 Essential (primary) hypertension: Secondary | ICD-10-CM | POA: Diagnosis not present

## 2020-01-19 DIAGNOSIS — Z87891 Personal history of nicotine dependence: Secondary | ICD-10-CM | POA: Diagnosis not present

## 2020-01-19 DIAGNOSIS — M1991 Primary osteoarthritis, unspecified site: Secondary | ICD-10-CM | POA: Diagnosis not present

## 2020-01-19 DIAGNOSIS — Z993 Dependence on wheelchair: Secondary | ICD-10-CM | POA: Diagnosis not present

## 2020-01-24 DIAGNOSIS — S82302D Unspecified fracture of lower end of left tibia, subsequent encounter for closed fracture with routine healing: Secondary | ICD-10-CM | POA: Diagnosis not present

## 2020-01-24 DIAGNOSIS — M109 Gout, unspecified: Secondary | ICD-10-CM | POA: Diagnosis not present

## 2020-01-24 DIAGNOSIS — E550 Rickets, active: Secondary | ICD-10-CM | POA: Diagnosis not present

## 2020-01-24 DIAGNOSIS — Z993 Dependence on wheelchair: Secondary | ICD-10-CM | POA: Diagnosis not present

## 2020-01-24 DIAGNOSIS — M1991 Primary osteoarthritis, unspecified site: Secondary | ICD-10-CM | POA: Diagnosis not present

## 2020-01-24 DIAGNOSIS — Z853 Personal history of malignant neoplasm of breast: Secondary | ICD-10-CM | POA: Diagnosis not present

## 2020-01-24 DIAGNOSIS — I10 Essential (primary) hypertension: Secondary | ICD-10-CM | POA: Diagnosis not present

## 2020-01-24 DIAGNOSIS — Z87891 Personal history of nicotine dependence: Secondary | ICD-10-CM | POA: Diagnosis not present

## 2020-01-24 DIAGNOSIS — M419 Scoliosis, unspecified: Secondary | ICD-10-CM | POA: Diagnosis not present

## 2020-02-02 DIAGNOSIS — S82235D Nondisplaced oblique fracture of shaft of left tibia, subsequent encounter for closed fracture with routine healing: Secondary | ICD-10-CM | POA: Diagnosis not present

## 2020-02-03 DIAGNOSIS — Z87891 Personal history of nicotine dependence: Secondary | ICD-10-CM | POA: Diagnosis not present

## 2020-02-03 DIAGNOSIS — M419 Scoliosis, unspecified: Secondary | ICD-10-CM | POA: Diagnosis not present

## 2020-02-03 DIAGNOSIS — Z853 Personal history of malignant neoplasm of breast: Secondary | ICD-10-CM | POA: Diagnosis not present

## 2020-02-03 DIAGNOSIS — S82302D Unspecified fracture of lower end of left tibia, subsequent encounter for closed fracture with routine healing: Secondary | ICD-10-CM | POA: Diagnosis not present

## 2020-02-03 DIAGNOSIS — I10 Essential (primary) hypertension: Secondary | ICD-10-CM | POA: Diagnosis not present

## 2020-02-03 DIAGNOSIS — E550 Rickets, active: Secondary | ICD-10-CM | POA: Diagnosis not present

## 2020-02-03 DIAGNOSIS — Z993 Dependence on wheelchair: Secondary | ICD-10-CM | POA: Diagnosis not present

## 2020-02-03 DIAGNOSIS — M109 Gout, unspecified: Secondary | ICD-10-CM | POA: Diagnosis not present

## 2020-02-03 DIAGNOSIS — M1991 Primary osteoarthritis, unspecified site: Secondary | ICD-10-CM | POA: Diagnosis not present

## 2020-02-08 DIAGNOSIS — M419 Scoliosis, unspecified: Secondary | ICD-10-CM | POA: Diagnosis not present

## 2020-02-08 DIAGNOSIS — E550 Rickets, active: Secondary | ICD-10-CM | POA: Diagnosis not present

## 2020-02-08 DIAGNOSIS — M109 Gout, unspecified: Secondary | ICD-10-CM | POA: Diagnosis not present

## 2020-02-08 DIAGNOSIS — Z993 Dependence on wheelchair: Secondary | ICD-10-CM | POA: Diagnosis not present

## 2020-02-08 DIAGNOSIS — Z87891 Personal history of nicotine dependence: Secondary | ICD-10-CM | POA: Diagnosis not present

## 2020-02-08 DIAGNOSIS — S82302D Unspecified fracture of lower end of left tibia, subsequent encounter for closed fracture with routine healing: Secondary | ICD-10-CM | POA: Diagnosis not present

## 2020-02-08 DIAGNOSIS — Z853 Personal history of malignant neoplasm of breast: Secondary | ICD-10-CM | POA: Diagnosis not present

## 2020-02-08 DIAGNOSIS — I10 Essential (primary) hypertension: Secondary | ICD-10-CM | POA: Diagnosis not present

## 2020-02-08 DIAGNOSIS — M1991 Primary osteoarthritis, unspecified site: Secondary | ICD-10-CM | POA: Diagnosis not present

## 2020-02-14 DIAGNOSIS — S82302D Unspecified fracture of lower end of left tibia, subsequent encounter for closed fracture with routine healing: Secondary | ICD-10-CM | POA: Diagnosis not present

## 2020-02-14 DIAGNOSIS — I10 Essential (primary) hypertension: Secondary | ICD-10-CM | POA: Diagnosis not present

## 2020-02-14 DIAGNOSIS — E550 Rickets, active: Secondary | ICD-10-CM | POA: Diagnosis not present

## 2020-02-14 DIAGNOSIS — Z993 Dependence on wheelchair: Secondary | ICD-10-CM | POA: Diagnosis not present

## 2020-02-14 DIAGNOSIS — Z87891 Personal history of nicotine dependence: Secondary | ICD-10-CM | POA: Diagnosis not present

## 2020-02-14 DIAGNOSIS — Z853 Personal history of malignant neoplasm of breast: Secondary | ICD-10-CM | POA: Diagnosis not present

## 2020-02-14 DIAGNOSIS — M419 Scoliosis, unspecified: Secondary | ICD-10-CM | POA: Diagnosis not present

## 2020-02-14 DIAGNOSIS — M109 Gout, unspecified: Secondary | ICD-10-CM | POA: Diagnosis not present

## 2020-02-14 DIAGNOSIS — M1991 Primary osteoarthritis, unspecified site: Secondary | ICD-10-CM | POA: Diagnosis not present

## 2020-02-15 DIAGNOSIS — Z87891 Personal history of nicotine dependence: Secondary | ICD-10-CM | POA: Diagnosis not present

## 2020-02-15 DIAGNOSIS — M419 Scoliosis, unspecified: Secondary | ICD-10-CM | POA: Diagnosis not present

## 2020-02-15 DIAGNOSIS — I10 Essential (primary) hypertension: Secondary | ICD-10-CM | POA: Diagnosis not present

## 2020-02-15 DIAGNOSIS — M109 Gout, unspecified: Secondary | ICD-10-CM | POA: Diagnosis not present

## 2020-02-15 DIAGNOSIS — Z853 Personal history of malignant neoplasm of breast: Secondary | ICD-10-CM | POA: Diagnosis not present

## 2020-02-15 DIAGNOSIS — S82302D Unspecified fracture of lower end of left tibia, subsequent encounter for closed fracture with routine healing: Secondary | ICD-10-CM | POA: Diagnosis not present

## 2020-02-15 DIAGNOSIS — Z9181 History of falling: Secondary | ICD-10-CM | POA: Diagnosis not present

## 2020-02-15 DIAGNOSIS — M1991 Primary osteoarthritis, unspecified site: Secondary | ICD-10-CM | POA: Diagnosis not present

## 2020-02-15 DIAGNOSIS — E550 Rickets, active: Secondary | ICD-10-CM | POA: Diagnosis not present

## 2020-02-17 ENCOUNTER — Ambulatory Visit: Payer: Medicare PPO | Admitting: Family Medicine

## 2020-02-23 DIAGNOSIS — M1991 Primary osteoarthritis, unspecified site: Secondary | ICD-10-CM | POA: Diagnosis not present

## 2020-02-23 DIAGNOSIS — I10 Essential (primary) hypertension: Secondary | ICD-10-CM | POA: Diagnosis not present

## 2020-02-23 DIAGNOSIS — Z853 Personal history of malignant neoplasm of breast: Secondary | ICD-10-CM | POA: Diagnosis not present

## 2020-02-23 DIAGNOSIS — Z87891 Personal history of nicotine dependence: Secondary | ICD-10-CM | POA: Diagnosis not present

## 2020-02-23 DIAGNOSIS — M109 Gout, unspecified: Secondary | ICD-10-CM | POA: Diagnosis not present

## 2020-02-23 DIAGNOSIS — S82302D Unspecified fracture of lower end of left tibia, subsequent encounter for closed fracture with routine healing: Secondary | ICD-10-CM | POA: Diagnosis not present

## 2020-02-23 DIAGNOSIS — Z9181 History of falling: Secondary | ICD-10-CM | POA: Diagnosis not present

## 2020-02-23 DIAGNOSIS — M419 Scoliosis, unspecified: Secondary | ICD-10-CM | POA: Diagnosis not present

## 2020-02-23 DIAGNOSIS — E550 Rickets, active: Secondary | ICD-10-CM | POA: Diagnosis not present

## 2020-02-29 ENCOUNTER — Other Ambulatory Visit: Payer: Self-pay | Admitting: Family Medicine

## 2020-02-29 DIAGNOSIS — E550 Rickets, active: Secondary | ICD-10-CM | POA: Diagnosis not present

## 2020-02-29 DIAGNOSIS — I10 Essential (primary) hypertension: Secondary | ICD-10-CM

## 2020-02-29 DIAGNOSIS — Z87891 Personal history of nicotine dependence: Secondary | ICD-10-CM | POA: Diagnosis not present

## 2020-02-29 DIAGNOSIS — Z853 Personal history of malignant neoplasm of breast: Secondary | ICD-10-CM | POA: Diagnosis not present

## 2020-02-29 DIAGNOSIS — M1991 Primary osteoarthritis, unspecified site: Secondary | ICD-10-CM | POA: Diagnosis not present

## 2020-02-29 DIAGNOSIS — S82302D Unspecified fracture of lower end of left tibia, subsequent encounter for closed fracture with routine healing: Secondary | ICD-10-CM | POA: Diagnosis not present

## 2020-02-29 DIAGNOSIS — Z9181 History of falling: Secondary | ICD-10-CM | POA: Diagnosis not present

## 2020-02-29 DIAGNOSIS — M419 Scoliosis, unspecified: Secondary | ICD-10-CM | POA: Diagnosis not present

## 2020-02-29 DIAGNOSIS — M109 Gout, unspecified: Secondary | ICD-10-CM | POA: Diagnosis not present

## 2020-02-29 NOTE — Telephone Encounter (Signed)
Called and spoke to pt. She is no longer coming here for her care.

## 2020-02-29 NOTE — Telephone Encounter (Signed)
Requested medication (s) are due for refill today - yes  Requested medication (s) are on the active medication list -yes  Future visit scheduled -no  Last refill: 12/28/19  Notes to clinic: Patient has possibly changed PCP- sent for review - possibly no longer patient at practice   Requested Prescriptions  Pending Prescriptions Disp Refills   Nebivolol HCl 20 MG TABS [Pharmacy Med Name: NEBIVOLOL 20MG  TABLETS] 90 tablet 1    Sig: TAKE 1 TABLET(20 MG) BY MOUTH DAILY      Cardiovascular:  Beta Blockers Failed - 02/29/2020 10:07 AM      Failed - Last BP in normal range    BP Readings from Last 1 Encounters:  11/30/19 (!) 154/94          Passed - Last Heart Rate in normal range    Pulse Readings from Last 1 Encounters:  11/30/19 60          Passed - Valid encounter within last 6 months    Recent Outpatient Visits           4 months ago Closed nondisplaced transverse fracture of shaft of left tibia, initial encounter   [facility] Delsa Grana, PA-C   6 months ago Colon cancer screening   [facility] [place], Drue Stager, MD   8 months ago History of TIA (transient ischemic attack)   [facility] Steele Sizer, MD   1 year ago Hypertension, benign   [facility] Steele Sizer, MD   1 year ago X-linked hypophosphatemic rickets   [facility] Steele Sizer, MD       Future Appointments             In 2 days Elby Beck, [place] at [place], [facility]                Requested Prescriptions  Pending Prescriptions Disp Refills   Nebivolol HCl 20 MG TABS [Pharmacy Med Name: NEBIVOLOL 20MG  TABLETS] 90 tablet 1    Sig: TAKE 1 TABLET(20 MG) BY MOUTH DAILY      Cardiovascular:  Beta Blockers Failed - 02/29/2020 10:07 AM      Failed - Last BP in normal range    BP Readings from Last 1 Encounters:  11/30/19 (!) 154/94          Passed - Last Heart  Rate in normal range    Pulse Readings from Last 1 Encounters:  11/30/19 60          Passed - Valid encounter within last 6 months    Recent Outpatient Visits           4 months ago Closed nondisplaced transverse fracture of shaft of left tibia, initial encounter   [facility] Delsa Grana, PA-C   6 months ago Colon cancer screening   [facility] [place], Drue Stager, MD   8 months ago History of TIA (transient ischemic attack)   [facility] Steele Sizer, MD   1 year ago Hypertension, benign   [facility] Steele Sizer, MD   1 year ago X-linked hypophosphatemic rickets   [facility] Steele Sizer, MD       Future Appointments             In 2 days Elby Beck, [place] at [place], [facility]

## 2020-03-02 ENCOUNTER — Encounter: Payer: Self-pay | Admitting: Family Medicine

## 2020-03-02 ENCOUNTER — Other Ambulatory Visit: Payer: Self-pay

## 2020-03-02 ENCOUNTER — Ambulatory Visit: Payer: Medicare PPO | Admitting: Family Medicine

## 2020-03-02 VITALS — BP 140/80 | HR 68 | Temp 97.6°F | Ht <= 58 in | Wt 123.0 lb

## 2020-03-02 DIAGNOSIS — Z1231 Encounter for screening mammogram for malignant neoplasm of breast: Secondary | ICD-10-CM | POA: Diagnosis not present

## 2020-03-02 DIAGNOSIS — F411 Generalized anxiety disorder: Secondary | ICD-10-CM

## 2020-03-02 DIAGNOSIS — M84351G Stress fracture, right femur, subsequent encounter for fracture with delayed healing: Secondary | ICD-10-CM

## 2020-03-02 DIAGNOSIS — E2839 Other primary ovarian failure: Secondary | ICD-10-CM

## 2020-03-02 DIAGNOSIS — I10 Essential (primary) hypertension: Secondary | ICD-10-CM | POA: Diagnosis not present

## 2020-03-02 MED ORDER — OLMESARTAN MEDOXOMIL 40 MG PO TABS: 40.0000 mg | ORAL_TABLET | Freq: Every day | ORAL | 1 refills | Status: DC

## 2020-03-02 MED ORDER — NEBIVOLOL HCL 20 MG PO TABS: ORAL_TABLET | ORAL | 1 refills | Status: DC

## 2020-03-02 NOTE — Progress Notes (Signed)
Subjective:    Patient ID: Felicia Robinson, female    DOB: 04-Jan-1946, 74 y.o.   MRN: [id number]  HPI Chief Complaint  Patient presents with  . Hypertension    has been checking at home provided list for review today.    This is a 74 yo female who  has a past medical history of Breast cancer ([place]) (2013), Cancer ([place]) (2013), Disorders of phosphorus metabolism, Fibroadenosis of breast, Gout (2013), Hypertension (2009), Malignant neoplasm of upper-outer quadrant of female breast ([place]) (09/30/2011), Osteomalacia, and Shingles (2013). She presents today for follow up of HTN and anxiety.   HTN- home readings, 126-167/67-89, HR 55-68.   Anxiety- increased her buspirone to BID at last visit. This has helped. Short tempered with her husband.   Bilateral feet numbness/ burning- couple of years, some worse, every day, no history of DM, normal B12 last year.  Takes oral supplementation.    Review of Systems Denies headache, visual changes, chest pain, shortness of breath, abdominal pain, diarrhea/constipation, dysuria, hematuria    Objective:   Physical Exam Physical Exam  Constitutional: Oriented to person, place, and time. Appears well-developed and well-nourished.  HENT:  Head: Normocephalic and atraumatic.  Eyes: Conjunctivae are normal.  Neck: Normal range of motion. Neck supple.  Cardiovascular: Normal rate, regular rhythm and normal heart sounds.   Pulmonary/Chest: Effort normal and breath sounds normal.  Musculoskeletal: No lower extremity edema.   Neurological: Alert and oriented to person, place, and time.  Skin: Skin is warm and dry.  Psychiatric: Normal mood and affect. Behavior is normal. Judgment and thought content normal.  Vitals reviewed.    BP 140/80   Pulse 68   Temp 97.6 F (36.4 C) (Temporal)   Ht 4\' 7"  (1.397 m)   Wt 123 lb (55.8 kg)   SpO2 96%   BMI 28.59 kg/m  Wt Readings from Last 3 Encounters:  03/02/20 123 lb (55.8 kg)  10/11/19 120 lb (54.4 kg)   09/28/19 120 lb (54.4 kg)   BP Readings from Last 3 Encounters:  03/02/20 140/80  11/30/19 (!) 154/94  11/07/19 136/90       Assessment & Plan:  1. X-linked hypophosphatemic rickets - Burosumab-twza (CRYSVITA) 30 MG/ML SOLN; Inject 30 mg into the skin every 30 (thirty) days. (prescribed by endocrine) - [facility] Bone Density; Future  2. Estrogen deficiency - overdue, she will call to schedule with mammogram, given number for scheduling - [facility] Bone Density; Future  3. Essential hypertension - improved, acceptable home readings, continue current medications as below - Nebivolol HCl 20 MG TABS; TAKE 1 TABLET(20 MG) BY MOUTH DAILY  Dispense: 90 tablet; Refill: 1 - olmesartan (BENICAR) 40 MG tablet; Take 1 tablet (40 mg total) by mouth daily.  Dispense: 90 tablet; Refill: 1  4. Stress fracture of right femur with delayed healing -Significant improvement, she is finally able to weight-bear, continue physical therapy per orthopedics - Burosumab-twza (CRYSVITA) 30 MG/ML SOLN; Inject 30 mg into the skin every 30 (thirty) days. - DG Bone Density; Future  5. GAD (generalized anxiety disorder) -Doing better on buspirone 5 mg twice daily, she knows that she can increase to 3 times daily if needed -Encouraged self-care measures  6. Screening mammogram for breast cancer -Provided her number to call and schedule with bone density  -Follow-up in 6 months  This visit occurred during the SARS-CoV-2 public health emergency.  Safety protocols were in place, including screening questions prior to the visit, additional usage of staff PPE,  and extensive cleaning of exam room while observing appropriate contact time as indicated for disinfecting solutions.      Clarene Reamer, FNP-BC  [facility] [facility] at [facility], [place] [facility]  03/02/2020 3:35 PM

## 2020-03-02 NOTE — Patient Instructions (Addendum)
Good to see you today  [place]- [phone]  Follow up in 6 months   Influenza (Flu) Vaccine (Inactivated or Recombinant): What You Need to Know 1. Why get vaccinated? Influenza vaccine can prevent influenza (flu). Flu is a contagious disease that spreads around the [place] every year, usually between October and May. Anyone can get the flu, but it is more dangerous for some people. Infants and young children, people 73 years of age and older, pregnant women, and people with certain health conditions or a weakened immune system are at greatest risk of flu complications. Pneumonia, bronchitis, sinus infections and ear infections are examples of flu-related complications. If you have a medical condition, such as heart disease, cancer or diabetes, flu can make it worse. Flu can cause fever and chills, sore throat, muscle aches, fatigue, cough, headache, and runny or stuffy nose. Some people may have vomiting and diarrhea, though this is more common in children than adults. Each year thousands of people in the [place] die from flu, and many more are hospitalized. Flu vaccine prevents millions of illnesses and flu-related visits to the doctor each year. 2. Influenza vaccine CDC recommends everyone 18 months of age and older get vaccinated every flu season. Children 6 months through 56 years of age may need 2 doses during a single flu season. Everyone else needs only 1 dose each flu season. It takes about 2 weeks for protection to develop after vaccination. There are many flu viruses, and they are always changing. Each year a new flu vaccine is made to protect against three or four viruses that are likely to cause disease in the upcoming flu season. Even when the vaccine doesn't exactly match these viruses, it may still provide some protection. Influenza vaccine does not cause flu. Influenza vaccine may be given at the same time as other vaccines. 3. Talk with  your health care provider Tell your vaccine provider if the person getting the vaccine:  Has had an allergic reaction after a previous dose of influenza vaccine, or has any severe, life-threatening allergies.  Has ever had Guillain-Barr Syndrome (also called GBS). In some cases, your health care provider may decide to postpone influenza vaccination to a future visit. People with minor illnesses, such as a cold, may be vaccinated. People who are moderately or severely ill should usually wait until they recover before getting influenza vaccine. Your health care provider can give you more information. 4. Risks of a vaccine reaction  Soreness, redness, and swelling where shot is given, fever, muscle aches, and headache can happen after influenza vaccine.  There may be a very small increased risk of Guillain-Barr Syndrome (GBS) after inactivated influenza vaccine (the flu shot). Young children who get the flu shot along with pneumococcal vaccine (PCV13), and/or DTaP vaccine at the same time might be slightly more likely to have a seizure caused by fever. Tell your health care provider if a child who is getting flu vaccine has ever had a seizure. People sometimes faint after medical procedures, including vaccination. Tell your provider if you feel dizzy or have vision changes or ringing in the ears. As with any medicine, there is a very remote chance of a vaccine causing a severe allergic reaction, other serious injury, or death. 5. What if there is a serious problem? An allergic reaction could occur after the vaccinated person leaves the clinic. If you see signs of a severe allergic reaction (hives, swelling of the face and throat, difficulty breathing,  a fast heartbeat, dizziness, or weakness), call 9-1-1 and get the person to the nearest hospital. For other signs that concern you, call your health care provider. Adverse reactions should be reported to the Vaccine Adverse Event Reporting System  (VAERS). Your health care provider will usually file this report, or you can do it yourself. Visit the VAERS website at www.vaers.SamedayNews.es or call 671-775-1121.VAERS is only for reporting reactions, and VAERS staff do not give medical advice. 6. The National Vaccine Injury Compensation Program The [employer] Vaccine Injury Compensation Program (VICP) is a federal program that was created to compensate people who may have been injured by certain vaccines. Visit the VICP website at GoldCloset.com.ee or call 347-031-5734 to learn about the program and about filing a claim. There is a time limit to file a claim for compensation. 7. How can I learn more?  Ask your healthcare provider.  Call your local or state health department.  Contact the Centers for Disease Control and Prevention (CDC): ? Call (641) 236-6422 (1-800-CDC-INFO) or ? Visit CDC's https://gibson.com/ Vaccine Information Statement (Interim) Inactivated Influenza Vaccine (12/03/2017) This information is not intended to replace advice given to you by your health care provider. Make sure you discuss any questions you have with your health care provider. Document Revised: 07/27/2018 Document Reviewed: 12/07/2017 Elsevier Patient Education  [place].

## 2020-03-06 DIAGNOSIS — Z87891 Personal history of nicotine dependence: Secondary | ICD-10-CM | POA: Diagnosis not present

## 2020-03-06 DIAGNOSIS — I10 Essential (primary) hypertension: Secondary | ICD-10-CM | POA: Diagnosis not present

## 2020-03-06 DIAGNOSIS — M109 Gout, unspecified: Secondary | ICD-10-CM | POA: Diagnosis not present

## 2020-03-06 DIAGNOSIS — S82302D Unspecified fracture of lower end of left tibia, subsequent encounter for closed fracture with routine healing: Secondary | ICD-10-CM | POA: Diagnosis not present

## 2020-03-06 DIAGNOSIS — M419 Scoliosis, unspecified: Secondary | ICD-10-CM | POA: Diagnosis not present

## 2020-03-06 DIAGNOSIS — Z9181 History of falling: Secondary | ICD-10-CM | POA: Diagnosis not present

## 2020-03-06 DIAGNOSIS — M1991 Primary osteoarthritis, unspecified site: Secondary | ICD-10-CM | POA: Diagnosis not present

## 2020-03-06 DIAGNOSIS — E550 Rickets, active: Secondary | ICD-10-CM | POA: Diagnosis not present

## 2020-03-06 DIAGNOSIS — Z853 Personal history of malignant neoplasm of breast: Secondary | ICD-10-CM | POA: Diagnosis not present

## 2020-03-16 DIAGNOSIS — E550 Rickets, active: Secondary | ICD-10-CM | POA: Diagnosis not present

## 2020-03-16 DIAGNOSIS — I10 Essential (primary) hypertension: Secondary | ICD-10-CM | POA: Diagnosis not present

## 2020-03-16 DIAGNOSIS — M419 Scoliosis, unspecified: Secondary | ICD-10-CM | POA: Diagnosis not present

## 2020-03-16 DIAGNOSIS — Z9181 History of falling: Secondary | ICD-10-CM | POA: Diagnosis not present

## 2020-03-16 DIAGNOSIS — M1991 Primary osteoarthritis, unspecified site: Secondary | ICD-10-CM | POA: Diagnosis not present

## 2020-03-16 DIAGNOSIS — Z87891 Personal history of nicotine dependence: Secondary | ICD-10-CM | POA: Diagnosis not present

## 2020-03-16 DIAGNOSIS — Z853 Personal history of malignant neoplasm of breast: Secondary | ICD-10-CM | POA: Diagnosis not present

## 2020-03-16 DIAGNOSIS — S82302D Unspecified fracture of lower end of left tibia, subsequent encounter for closed fracture with routine healing: Secondary | ICD-10-CM | POA: Diagnosis not present

## 2020-03-16 DIAGNOSIS — M109 Gout, unspecified: Secondary | ICD-10-CM | POA: Diagnosis not present

## 2020-05-11 ENCOUNTER — Other Ambulatory Visit: Payer: Self-pay | Admitting: Family Medicine

## 2020-05-11 DIAGNOSIS — E785 Hyperlipidemia, unspecified: Secondary | ICD-10-CM

## 2020-05-11 DIAGNOSIS — Z8673 Personal history of transient ischemic attack (TIA), and cerebral infarction without residual deficits: Secondary | ICD-10-CM

## 2020-07-10 ENCOUNTER — Other Ambulatory Visit: Payer: Self-pay | Admitting: Family Medicine

## 2020-07-10 DIAGNOSIS — F411 Generalized anxiety disorder: Secondary | ICD-10-CM

## 2020-07-10 DIAGNOSIS — E785 Hyperlipidemia, unspecified: Secondary | ICD-10-CM

## 2020-07-10 DIAGNOSIS — Z8673 Personal history of transient ischemic attack (TIA), and cerebral infarction without residual deficits: Secondary | ICD-10-CM

## 2020-07-10 NOTE — Telephone Encounter (Signed)
Patient walked in to office.  Patient was seen by Jackelyn Poling.  Patient was supposed to see Jackelyn Poling in May for a follow up. Patient said she just has 4 pills left of Buspar.  Patient said she's under a lot of stress and she needs the refill.  Patient said she mainly takes 2 pills per day, but occasionally 3 pills.  Patient uses [facility] by [address] Opp[address]. Patient will be at the [facility] most of April and wanted Larene Beach to check to make sure she's not due for refills on any other medications before May.

## 2020-07-12 MED ORDER — BUSPIRONE HCL 5 MG PO TABS: 5.0000 mg | ORAL_TABLET | Freq: Three times a day (TID) | ORAL | 1 refills | Status: DC | PRN

## 2020-07-12 MED ORDER — ROSUVASTATIN CALCIUM 5 MG PO TABS: 5.0000 mg | ORAL_TABLET | Freq: Every day | ORAL | 1 refills | Status: DC

## 2020-07-12 NOTE — Telephone Encounter (Signed)
Name of Medication:  buspar and rosuvastatin Name of Pharmacy: [facility] Last Fill or Written Date and Quantity: 08/17/19 , #270, 1 RF, TID Last Office Visit and Type: 03/02/20 Next Office Visit and Type: was to have f/u with Jackelyn Poling in May

## 2020-07-12 NOTE — Telephone Encounter (Signed)
ERx 

## 2020-07-17 ENCOUNTER — Telehealth: Payer: Self-pay

## 2020-07-17 NOTE — Telephone Encounter (Signed)
Pt requesting status of refill request for Buspar 5 mg (sent to [facility] # 270 x 1 on 07/12/20 by Dr Darnell Level & Rosuvastatin 5 mg # 90 X 1 on 07/12/20 by Dr Darnell Level. Pt has not cked with pharmacy and will [facility] to verify ready for pick up. Pt also request other [facility] providers taking new pts; Carrie at front desk went over [facility] providers taking new pts with pt. Nothing further needed.

## 2020-08-23 ENCOUNTER — Other Ambulatory Visit: Payer: Self-pay | Admitting: Family Medicine

## 2020-08-23 DIAGNOSIS — I10 Essential (primary) hypertension: Secondary | ICD-10-CM

## 2020-08-23 NOTE — Telephone Encounter (Signed)
  LAST APPOINTMENT DATE: 07/17/2020   NEXT APPOINTMENT DATE:@Visit  date not found  MEDICATION: olmesartan  PH[facility]ACY: [facility] [address]  Let patient know to contact pharmacy at the end of the day to make sure medication is ready.  Please notify patient to allow 48-72 hours to process  Encourage patient to contact the pharmacy for refills or they can request refills through [place]:   LAST REFILL:  QTY:  REFILL DATE:    OTHER COMMENTS:    Okay for refill?  Please advise

## 2020-08-29 ENCOUNTER — Ambulatory Visit: Payer: Medicare PPO | Admitting: Family Medicine

## 2020-08-31 MED ORDER — OLMESARTAN MEDOXOMIL 40 MG PO TABS: 40.0000 mg | ORAL_TABLET | Freq: Every day | ORAL | 0 refills | Status: DC

## 2020-08-31 NOTE — Addendum Note (Signed)
Addended by: Lurlean Nanny on: 08/31/2020 10:22 AM   Modules accepted: Orders

## 2020-09-20 ENCOUNTER — Telehealth: Payer: Self-pay | Admitting: Family Medicine

## 2020-09-20 NOTE — Telephone Encounter (Signed)
LVM for pt to rtn my call to schedule AWV with [facility].  

## 2020-10-03 ENCOUNTER — Ambulatory Visit: Payer: Medicare PPO | Admitting: Adult Health

## 2020-10-08 ENCOUNTER — Other Ambulatory Visit: Payer: Self-pay | Admitting: General Surgery

## 2020-10-08 DIAGNOSIS — Z1231 Encounter for screening mammogram for malignant neoplasm of breast: Secondary | ICD-10-CM

## 2020-11-27 ENCOUNTER — Other Ambulatory Visit: Payer: Self-pay | Admitting: Family Medicine

## 2020-11-27 DIAGNOSIS — I10 Essential (primary) hypertension: Secondary | ICD-10-CM

## 2020-11-27 MED ORDER — OLMESARTAN MEDOXOMIL 40 MG PO TABS: 40.0000 mg | ORAL_TABLET | Freq: Every day | ORAL | 0 refills | Status: DC

## 2020-11-27 MED ORDER — NEBIVOLOL HCL 20 MG PO TABS: ORAL_TABLET | ORAL | 0 refills | Status: DC

## 2020-11-27 NOTE — Addendum Note (Signed)
Addended by: Carter Kitten on: 11/27/2020 12:16 PM   Modules accepted: Orders

## 2020-11-27 NOTE — Telephone Encounter (Deleted)
Last office visit 03/11/2020 with Glenda Chroman for HTN.  Last refilled Nebivolol 03/02/2020 for #90 with 1 refill.  Olmesartan 08/31/2020 for #90 with no refills  TOC appointment scheduled with Sharyn Lull Flinchum 01/03/2021.

## 2020-11-27 NOTE — Telephone Encounter (Signed)
Last refill 08/31/20 #90 Last office visit 03/02/20 Upcoming appointment 01/03/21 with Laverna Peace NP

## 2020-11-27 NOTE — Addendum Note (Signed)
Addended by: Emelia Salisbury C on: 11/27/2020 12:16 PM   Modules accepted: Orders

## 2020-11-27 NOTE — Telephone Encounter (Signed)
  Encourage patient to contact the pharmacy for refills or they can request refills through [place]:  Please schedule appointment if longer than 1 year  NEXT APPOINTMENT DATE: 01/03/21 @ [facility]  MEDICATION: Nebivolol HCl 20 MG TABS olmesartan (BENICAR) 40 MG tablet  Is the patient out of medication? yes  [facility]   Let patient know to contact pharmacy at the end of the day to make sure medication is ready.  Please notify patient to allow 48-72 hours to process  CLINICAL FILLS OUT ALL BELOW:   LAST REFILL:  QTY:  REFILL DATE:    OTHER COMMENTS:    Okay for refill?  Please advise

## 2020-12-06 ENCOUNTER — Encounter: Payer: Self-pay | Admitting: Adult Health

## 2020-12-12 ENCOUNTER — Telehealth: Payer: Self-pay | Admitting: Adult Health

## 2020-12-12 NOTE — Telephone Encounter (Signed)
Patient calling back in from receiving reschedule letter. Informed that Michelle's medical leave has been extended.   Patient did not want to reschedule and states she will return to her previous doctor's office.

## 2021-01-02 DIAGNOSIS — M84351G Stress fracture, right femur, subsequent encounter for fracture with delayed healing: Secondary | ICD-10-CM | POA: Diagnosis not present

## 2021-01-03 ENCOUNTER — Ambulatory Visit: Payer: Medicare PPO | Admitting: Adult Health

## 2021-01-21 ENCOUNTER — Other Ambulatory Visit: Payer: Self-pay | Admitting: Family

## 2021-01-21 ENCOUNTER — Other Ambulatory Visit: Payer: Self-pay | Admitting: Family Medicine

## 2021-01-21 DIAGNOSIS — Z8673 Personal history of transient ischemic attack (TIA), and cerebral infarction without residual deficits: Secondary | ICD-10-CM

## 2021-01-21 DIAGNOSIS — E785 Hyperlipidemia, unspecified: Secondary | ICD-10-CM

## 2021-01-21 NOTE — Telephone Encounter (Signed)
Pt of Tor Netters, NP Last OV 03-11-20 Has not established with new provider.

## 2021-02-02 ENCOUNTER — Encounter: Payer: Self-pay | Admitting: Nurse Practitioner

## 2021-02-02 DIAGNOSIS — E538 Deficiency of other specified B group vitamins: Secondary | ICD-10-CM | POA: Insufficient documentation

## 2021-02-02 DIAGNOSIS — E782 Mixed hyperlipidemia: Secondary | ICD-10-CM | POA: Insufficient documentation

## 2021-02-04 ENCOUNTER — Ambulatory Visit: Payer: Medicare PPO | Admitting: Nurse Practitioner

## 2021-02-04 ENCOUNTER — Other Ambulatory Visit: Payer: Self-pay

## 2021-02-04 ENCOUNTER — Encounter: Payer: Self-pay | Admitting: Nurse Practitioner

## 2021-02-04 VITALS — BP 160/80 | HR 58 | Temp 98.3°F | Ht <= 58 in | Wt 114.6 lb

## 2021-02-04 DIAGNOSIS — Z853 Personal history of malignant neoplasm of breast: Secondary | ICD-10-CM

## 2021-02-04 DIAGNOSIS — E782 Mixed hyperlipidemia: Secondary | ICD-10-CM | POA: Diagnosis not present

## 2021-02-04 DIAGNOSIS — E538 Deficiency of other specified B group vitamins: Secondary | ICD-10-CM

## 2021-02-04 DIAGNOSIS — Z23 Encounter for immunization: Secondary | ICD-10-CM

## 2021-02-04 DIAGNOSIS — I10 Essential (primary) hypertension: Secondary | ICD-10-CM

## 2021-02-04 DIAGNOSIS — F411 Generalized anxiety disorder: Secondary | ICD-10-CM

## 2021-02-04 DIAGNOSIS — Z1231 Encounter for screening mammogram for malignant neoplasm of breast: Secondary | ICD-10-CM

## 2021-02-04 DIAGNOSIS — D509 Iron deficiency anemia, unspecified: Secondary | ICD-10-CM

## 2021-02-04 DIAGNOSIS — M9080 Osteopathy in diseases classified elsewhere, unspecified site: Secondary | ICD-10-CM

## 2021-02-04 DIAGNOSIS — Z7689 Persons encountering health services in other specified circumstances: Secondary | ICD-10-CM

## 2021-02-04 MED ORDER — ROSUVASTATIN CALCIUM 5 MG PO TABS: ORAL_TABLET | ORAL | 1 refills | Status: DC

## 2021-02-04 MED ORDER — BUSPIRONE HCL 5 MG PO TABS: 5.0000 mg | ORAL_TABLET | Freq: Three times a day (TID) | ORAL | 1 refills | Status: DC | PRN

## 2021-02-04 MED ORDER — NEBIVOLOL HCL 20 MG PO TABS: ORAL_TABLET | ORAL | 0 refills | Status: DC

## 2021-02-04 MED ORDER — SERTRALINE HCL 25 MG PO TABS: 25.0000 mg | ORAL_TABLET | Freq: Every day | ORAL | 0 refills | Status: DC

## 2021-02-04 MED ORDER — OLMESARTAN MEDOXOMIL 40 MG PO TABS: 40.0000 mg | ORAL_TABLET | Freq: Every day | ORAL | 0 refills | Status: DC

## 2021-02-04 NOTE — Assessment & Plan Note (Signed)
Chronic, not controlled. She states that when she checks her blood pressure at home it runs 130s-140s/70s. She believes her exacerbated mood has played a role in her elevated blood pressure readings as well. Will start her on zoloft 25mg  daily. Encourage her to check her blood pressure at home daily and write it down along with following a low salt diet. Will continue her nebivolol 20mg  daily and olmesartan 40mg  daily with refills sent to the pharmacy. Check CMP, CBC, and TSH today. Follow up in 4-6 weeks.

## 2021-02-04 NOTE — Assessment & Plan Note (Signed)
Follows with endocrine and gets monthly injections of crysvita. Her vitamin D was low last month and she is taking an over the counter supplement as well. Her PTH is elevated and they are monitoring this as well. Continue collaboration and recommendations from endocrine.

## 2021-02-04 NOTE — Assessment & Plan Note (Signed)
History of left mastectomy. States she no longer needs follow up from oncology for this.

## 2021-02-04 NOTE — Assessment & Plan Note (Signed)
Chronic, taking crestor 5 mg daily. Refill sent to the pharmacy. Will check lipid panel today and adjust regimen as needed.

## 2021-02-04 NOTE — Assessment & Plan Note (Signed)
Chronic, taking OTC B12 supplements. Will check B12 level today and adjust regimen as needed.

## 2021-02-04 NOTE — Patient Instructions (Addendum)
Start zoloft 25mg  once a day (in the evening)  Continue checking your blood pressure every day  [facility]: [address], [place] [address]  Phone: [phone]

## 2021-02-04 NOTE — Progress Notes (Signed)
New Patient Office Visit  Subjective:  Patient ID: Felicia Robinson, female    DOB: [birth year]  Age: 75 y.o. MRN: [id number]  CC:  Chief Complaint  Patient presents with   New Patient (Initial Visit)   Hypertension    HPI Felicia Robinson presents for new patient visit to establish care.  Introduced to Designer, jewellery role and practice setting.  All questions answered.  Discussed provider/patient relationship and expectations.  HYPERTENSION  Hypertension status: exacerbated  Satisfied with current treatment? yes Duration of hypertension: chronic BP monitoring frequency:  daily - although cuff is getting older BP range: 130-140/70s BP medication side effects:  no Medication compliance: excellent compliance Previous BP meds: olmesartan, nebivolol Aspirin: yes Recurrent headaches: no Visual changes: no Palpitations: no Dyspnea: no Chest pain: no Lower extremity edema: no Dizzy/lightheaded: no  DEPRESSION  She has had history of anxiety and is taking buspar twice a day prn. Her husband recently passed away unexpectedly this summer and the last few months have been hard on her.  Mood status: exacerbated Satisfied with current treatment?: no Symptom severity: moderate  Duration of current treatment : years Side effects: no Medication compliance: excellent compliance Psychotherapy/counseling: no  Previous psychiatric medications: buspar, xanax Depressed mood: yes Anxious mood: yes Anhedonia: no Significant weight loss or gain: yes - weight loss Insomnia:  improving  hard to stay asleep Fatigue: no Feelings of worthlessness or guilt: no Impaired concentration/indecisiveness: no Suicidal ideations: no Hopelessness: no Crying spells: yes Depression screen [facility] 2/9 02/04/2021 10/11/2019 08/17/2019 06/14/2019 02/16/2019  Decreased Interest _0 Down, Depressed, Hopeless _1 PHQ - 2 Score _2 Altered sleeping _3 0 0  Tired, decreased energy _4 Change in appetite 0 0 1 0 0  Feeling bad or failure about yourself  0 _5 0  Trouble concentrating 1 0 1 0 0  Moving slowly or fidgety/restless 0 0 0 0 1  Suicidal thoughts 0 0 0 - 0  PHQ-9 Score _6 Difficult doing work/chores Not difficult at all Somewhat difficult Somewhat difficult Somewhat difficult Somewhat difficult  Some recent data might be hidden   GAD 7 : Generalized Anxiety Score 02/04/2021 08/17/2019 06/14/2019 02/16/2019  Nervous, Anxious, on Edge _7 Control/stop worrying 0 1 1 0  Worry too much - different things 2 1 0 0  Trouble relaxing 0 _8 Restless 0 0 0 0  Easily annoyed or irritable _9 0  Afraid - awful might happen 0 1 0 1  Total GAD 7 Score _10 Anxiety Difficulty Somewhat difficult Somewhat difficult - Not difficult at all    Past Medical History:  Diagnosis Date   Breast cancer ([place]) 2013   left breast   Cancer ([place]) 2013   L breast   Disorders of phosphorus metabolism    rickets   Fibroadenosis of breast    Gout 2013   Hypertension 2009   Malignant neoplasm of upper-outer quadrant of female breast ([place]) 09/30/2011   7 mm low-grade invasive mammary carcinoma with focal DCIS. ER 90%, PR 1%, HER-2/neu not overexpressing.   Osteomalacia    Shingles 2013    Past Surgical History:  Procedure Laterality Date   BREAST SURGERY Left 09/30/2011   Left simple mastectomy with sentinel node biopsy   CATARACT EXTRACTION,  BILATERAL Bilateral 04/2015   Dr. Tommy Rainwater in [place] Left 2013   with SN biopsy   TONSILLECTOMY  1953   UPPER GI ENDOSCOPY  2012    Family History  Problem Relation Age of Onset   Alzheimer's disease Mother    Stroke Father    Lymphoma Brother 55       Hodgkin's Lymphoma   Cancer Brother        non hodgkins lymphoma    Heart disease Brother    Rickets Son    Cancer Maternal Uncle 41       cancer of the esophagus   Heart  disease Maternal Grandfather    Breast cancer Neg Hx     Social History   Socioeconomic History   Marital status: Widowed    Spouse name: Sonny   Number of children: 1   Years of education: some college   Highest education level: 12th grade  Occupational History   Occupation: Retired  Tobacco Use   Smoking status: Former    Packs/day: 0.50    Years: 30.00    Pack years: 15.00    Types: Cigarettes    Quit date: 04/21/2006    Years since quitting: 14.8   Smokeless tobacco: Never  Vaping Use   Vaping Use: Never used  Substance and Sexual Activity   Alcohol use: Yes    Alcohol/week: 1.0 standard drink    Types: 1 Glasses of wine per week   Drug use: No   Sexual activity: Not Currently  Other Topics Concern   Not on file  Social History Narrative   Not on file   Social Determinants of Health   Financial Resource Strain: Not on file  Food Insecurity: Not on file  Transportation Needs: Not on file  Physical Activity: Not on file  Stress: Not on file  Social Connections: Not on file  Intimate Partner Violence: Not on file    ROS Review of Systems  Constitutional: Negative.   HENT: Negative.    Eyes: Negative.   Respiratory: Negative.    Cardiovascular: Negative.   Gastrointestinal: Negative.   Genitourinary:  Positive for frequency (stable). Negative for dysuria and urgency.  Musculoskeletal:  Positive for gait problem. Back pain: walks with a cane. Skin: Negative.   Neurological:  Negative for dizziness, light-headedness and headaches.  Psychiatric/Behavioral:  Positive for dysphoric mood. The patient is nervous/anxious.    Objective:   Today's Vitals: BP (!) 160/80 (BP Location: Right Arm, Patient Position: Sitting)   Pulse (!) 58   Temp 98.3 F (36.8 C) (Oral)   Ht 4' 6.1" (1.374 m)   Wt 114 lb 9.6 oz (52 kg)   SpO2 98%   BMI 27.53 kg/m   Physical Exam Vitals and nursing note reviewed.  Constitutional:      General: She is not in acute distress.     Appearance: Normal appearance.  HENT:     Head: Normocephalic.  Eyes:     Conjunctiva/sclera: Conjunctivae normal.  Cardiovascular:     Rate and Rhythm: Normal rate and regular rhythm.     Pulses: Normal pulses.     Heart sounds: Normal heart sounds.  Pulmonary:     Effort: Pulmonary effort is normal.     Breath sounds: Normal breath sounds.  Abdominal:     Palpations: Abdomen is soft.     Tenderness: There is no abdominal tenderness.  Musculoskeletal:     Cervical back: Normal range of motion.     Comments: Ambulates with a cane  Skin:    General: Skin is warm.  Neurological:     General: No focal deficit present.     Mental Status: She is alert and oriented to person, place, and time.  Psychiatric:        Mood and Affect: Mood normal.        Behavior: Behavior normal.        Thought Content: Thought content normal.        Judgment: Judgment normal.    Assessment & Plan:   Problem List Items Addressed This Visit       Cardiovascular and Mediastinum   Essential hypertension    Chronic, not controlled. She states that when she checks her blood pressure at home it runs 130s-140s/70s. She believes her exacerbated mood has played a role in her elevated blood pressure readings as well. Will start her on zoloft 69m daily. Encourage her to check her blood pressure at home daily and write it down along with following a low salt diet. Will continue her nebivolol 217mdaily and olmesartan 4059maily with refills sent to the pharmacy. Check CMP, CBC, and TSH today. Follow up in 4-6 weeks.       Relevant Medications   rosuvastatin (CRESTOR) 5 MG tablet   Nebivolol HCl 20 MG TABS   olmesartan (BENICAR) 40 MG tablet   Other Relevant Orders   CBC with Differential/Platelet   Comprehensive metabolic panel   TSH     Musculoskeletal and Integument   X-linked hypophosphatemic rickets    Follows with endocrine and gets monthly injections of crysvita. Her vitamin D was low last month  and she is taking an over the counter supplement as well. Her PTH is elevated and they are monitoring this as well. Continue collaboration and recommendations from endocrine.         Other   History of breast cancer in female    History of left mastectomy. States she no longer needs follow up from oncology for this.       GAD (generalized anxiety disorder) - Primary    Chronic, exacerbated. She states that with the unexpected passing of her husband in July, her mood has been all over the place. She is taking buspar BID prn and she feels this is not fully controlling her symptoms. She was on xanax in the past, which helped her significantly, however her doctor took her off this for unknown reasons to her. Discussed the risks of xanax, and how there are other medications that we can try first. Will start zoloft 61m96mily. She currently denies SI/HI. PHQ 9 is a 5 and GAD 7 is a 4. She can continue to take buspar prn. Refill sent to the pharmacy. Follow-up in 4-6 weeks.       Relevant Medications   sertraline (ZOLOFT) 25 MG tablet   busPIRone (BUSPAR) 5 MG tablet   B12 deficiency    Chronic, taking OTC B12 supplements. Will check B12 level today and adjust regimen as needed.       Relevant Orders   Vitamin B12   Mixed hyperlipidemia    Chronic, taking crestor 5 mg daily. Refill sent to the pharmacy. Will check lipid panel today and adjust regimen as needed.       Relevant Medications   rosuvastatin (CRESTOR) 5 MG tablet   Nebivolol HCl 20 MG TABS   olmesartan (  BENICAR) 40 MG tablet   Other Relevant Orders   Lipid Panel w/o Chol/HDL Ratio   Other Visit Diagnoses     Encounter for screening mammogram for malignant neoplasm of breast       screening mammogram ordered today   Relevant Orders   MM 3D SCREEN BREAST UNI RIGHT   Need for influenza vaccination       Flu vaccine given today   Relevant Orders   Flu Vaccine QUAD High Dose(Fluad) (Completed)   Encounter to establish care            Outpatient Encounter Medications as of 02/04/2021  Medication Sig   aspirin 81 MG chewable tablet Chew 81 mg by mouth daily.   Burosumab-twza (CRYSVITA) 30 MG/ML SOLN Inject 30 mg into the skin every 30 (thirty) days.   Cholecalciferol (VITAMIN D3) 50 MCG (2000 UT) TABS Take 2,000 Units by mouth daily.   Cyanocobalamin (VITAMIN B12 PO) Take 3,000 mcg by mouth daily.   ibuprofen (ADVIL) 200 MG tablet Take 200 mg by mouth every 6 (six) hours as needed.   sertraline (ZOLOFT) 25 MG tablet Take 1 tablet (25 mg total) by mouth daily.   [DISCONTINUED] busPIRone (BUSPAR) 5 MG tablet Take 1 tablet (5 mg total) by mouth 3 (three) times daily as needed.   [DISCONTINUED] Nebivolol HCl 20 MG TABS TAKE 1 TABLET(20 MG) BY MOUTH DAILY   [DISCONTINUED] olmesartan (BENICAR) 40 MG tablet Take 1 tablet (40 mg total) by mouth daily.   [DISCONTINUED] rosuvastatin (CRESTOR) 5 MG tablet TAKE 1 TABLET(5 MG) BY MOUTH DAILY   busPIRone (BUSPAR) 5 MG tablet Take 1 tablet (5 mg total) by mouth 3 (three) times daily as needed.   Nebivolol HCl 20 MG TABS TAKE 1 TABLET(20 MG) BY MOUTH DAILY   olmesartan (BENICAR) 40 MG tablet Take 1 tablet (40 mg total) by mouth daily.   rosuvastatin (CRESTOR) 5 MG tablet TAKE 1 TABLET(5 MG) BY MOUTH DAILY   [DISCONTINUED] Cyanocobalamin (B-12) 1000 MCG SUBL Place 1 tablet under the tongue 3 (three) times a week.   [DISCONTINUED] K Phos Mono-Sod Phos Di & Mono (K-PHOS-NEUTRAL) [phone] MG TABS Take 250 mg by mouth daily.    No facility-administered encounter medications on file as of 02/04/2021.    Follow-up: Return in about 6 weeks (around 03/18/2021) for htn, mood with Jolene.   Charyl Dancer, NP

## 2021-02-04 NOTE — Assessment & Plan Note (Signed)
Chronic, exacerbated. She states that with the unexpected passing of her husband in July, her mood has been all over the place. She is taking buspar BID prn and she feels this is not fully controlling her symptoms. She was on xanax in the past, which helped her significantly, however her doctor took her off this for unknown reasons to her. Discussed the risks of xanax, and how there are other medications that we can try first. Will start zoloft 25mg  daily. She currently denies SI/HI. PHQ 9 is a 5 and GAD 7 is a 4. She can continue to take buspar prn. Refill sent to the pharmacy. Follow-up in 4-6 weeks.

## 2021-02-05 LAB — CBC WITH DIFFERENTIAL/PLATELET
Basophils Absolute: 0.1 10*3/uL (ref 0.0–0.2)
Basos: 1 %
EOS (ABSOLUTE): 0.3 10*3/uL (ref 0.0–0.4)
Eos: 3 %
Hematocrit: 31.4 % — ABNORMAL LOW (ref 34.0–46.6)
Hemoglobin: 9 g/dL — ABNORMAL LOW (ref 11.1–15.9)
Immature Grans (Abs): 0 10*3/uL (ref 0.0–0.1)
Immature Granulocytes: 0 %
Lymphocytes Absolute: 2.6 10*3/uL (ref 0.7–3.1)
Lymphs: 29 %
MCH: 20.8 pg — ABNORMAL LOW (ref 26.6–33.0)
MCHC: 28.7 g/dL — ABNORMAL LOW (ref 31.5–35.7)
MCV: 73 fL — ABNORMAL LOW (ref 79–97)
Monocytes Absolute: 0.6 10*3/uL (ref 0.1–0.9)
Monocytes: 7 %
Neutrophils Absolute: 5.3 10*3/uL (ref 1.4–7.0)
Neutrophils: 60 %
Platelets: 282 10*3/uL (ref 150–450)
RBC: 4.33 x10E6/uL (ref 3.77–5.28)
RDW: 15.1 % (ref 11.7–15.4)
WBC: 8.8 10*3/uL (ref 3.4–10.8)

## 2021-02-05 LAB — COMPREHENSIVE METABOLIC PANEL
ALT: 9 IU/L (ref 0–32)
AST: 18 IU/L (ref 0–40)
Albumin/Globulin Ratio: 1.9 (ref 1.2–2.2)
Albumin: 4.8 g/dL — ABNORMAL HIGH (ref 3.7–4.7)
Alkaline Phosphatase: 200 IU/L — ABNORMAL HIGH (ref 44–121)
BUN/Creatinine Ratio: 17 (ref 12–28)
BUN: 18 mg/dL (ref 8–27)
Bilirubin Total: 0.3 mg/dL (ref 0.0–1.2)
CO2: 21 mmol/L (ref 20–29)
Calcium: 9.4 mg/dL (ref 8.7–10.3)
Chloride: 102 mmol/L (ref 96–106)
Creatinine, Ser: 1.05 mg/dL — ABNORMAL HIGH (ref 0.57–1.00)
Globulin, Total: 2.5 g/dL (ref 1.5–4.5)
Glucose: 81 mg/dL (ref 70–99)
Potassium: 4.8 mmol/L (ref 3.5–5.2)
Sodium: 137 mmol/L (ref 134–144)
Total Protein: 7.3 g/dL (ref 6.0–8.5)
eGFR: 56 mL/min/{1.73_m2} — ABNORMAL LOW (ref >59)

## 2021-02-05 LAB — TSH: TSH: 1.07 u[IU]/mL (ref 0.450–4.500)

## 2021-02-05 LAB — LIPID PANEL W/O CHOL/HDL RATIO
Cholesterol, Total: 211 mg/dL — ABNORMAL HIGH (ref 100–199)
HDL: 62 mg/dL (ref >39)
LDL Chol Calc (NIH): 113 mg/dL — ABNORMAL HIGH (ref 0–99)
Triglycerides: 207 mg/dL — ABNORMAL HIGH (ref 0–149)
VLDL Cholesterol Cal: 36 mg/dL (ref 5–40)

## 2021-02-05 LAB — VITAMIN B12: Vitamin B-12: 1371 pg/mL — ABNORMAL HIGH (ref 232–1245)

## 2021-02-05 NOTE — Addendum Note (Signed)
Addended by: Vance Peper A on: 02/05/2021 08:12 AM   Modules accepted: Orders

## 2021-02-06 ENCOUNTER — Telehealth: Payer: Self-pay

## 2021-02-06 NOTE — Telephone Encounter (Signed)
Left detailed message for patient to review mychart and let us know who/if she has a gastroenterologist.

## 2021-02-06 NOTE — Telephone Encounter (Signed)
-----   Message from Charyl Dancer, NP sent at 02/05/2021  8:13 AM EDT ----- Not sure if I sent the result note to you all to call her:  Please call Felicia Robinson and let her know that her kidneys are stable, her thyroid is normal, and her cholesterol is stable. Her blood counts are down from the last check. I would like her to come back and repeat this along with an iron panel either Thursday or Friday. Has she noticed any blood in her stools? She is also due for a colonoscopy, would she like me to order this for her? Does she have a preference for a GI doctor?   Thank you!

## 2021-02-27 ENCOUNTER — Telehealth: Payer: Self-pay | Admitting: Nurse Practitioner

## 2021-02-27 NOTE — Telephone Encounter (Signed)
Copied from [place] [id number]. Topic: Medicare AWV >> Feb 27, 2021  4:12 PM Lavonia Drafts wrote: Reason for CRM: Left message for patient to call back and schedule the Medicare Annual Wellness Visit (AWV) virtually or by telephone.  Last AWV 11/16/18  Please schedule at anytime with [facility]-Nurse Health Advisor.  45 minute appointment  Any questions, please call me at [phone]

## 2021-03-24 DIAGNOSIS — N183 Chronic kidney disease, stage 3 unspecified: Secondary | ICD-10-CM | POA: Insufficient documentation

## 2021-03-25 ENCOUNTER — Ambulatory Visit: Payer: Medicare PPO | Admitting: Nurse Practitioner

## 2021-03-25 ENCOUNTER — Other Ambulatory Visit: Payer: Self-pay

## 2021-03-25 ENCOUNTER — Encounter: Payer: Self-pay | Admitting: Nurse Practitioner

## 2021-03-25 VITALS — BP 130/72 | HR 64 | Temp 98.6°F | Resp 18 | Ht <= 58 in | Wt 115.0 lb

## 2021-03-25 DIAGNOSIS — E538 Deficiency of other specified B group vitamins: Secondary | ICD-10-CM | POA: Diagnosis not present

## 2021-03-25 DIAGNOSIS — N1831 Chronic kidney disease, stage 3a: Secondary | ICD-10-CM

## 2021-03-25 DIAGNOSIS — E559 Vitamin D deficiency, unspecified: Secondary | ICD-10-CM | POA: Diagnosis not present

## 2021-03-25 DIAGNOSIS — D649 Anemia, unspecified: Secondary | ICD-10-CM | POA: Insufficient documentation

## 2021-03-25 DIAGNOSIS — M9080 Osteopathy in diseases classified elsewhere, unspecified site: Secondary | ICD-10-CM | POA: Diagnosis not present

## 2021-03-25 DIAGNOSIS — F4321 Adjustment disorder with depressed mood: Secondary | ICD-10-CM

## 2021-03-25 NOTE — Assessment & Plan Note (Signed)
Followed by endocrinology at [facility] and receives monthly injections, continue this collaboration.  Recent labs reviewed and notes.  Continue Vit D supplement as recommended by them.

## 2021-03-25 NOTE — Assessment & Plan Note (Addendum)
Noted on recent labs, decline with CBC on labs one year ago.  She does take Advil regularly at home, recommend cutting back on this.  Plan to recheck CBC, iron/ferritin, B12, and send home stool pack with her.  Discussed at length with her.  She is asymptomatic at this time.  ?related to kidneys vs GI or rickets.  Consider hematology or GI referral is worsening.

## 2021-03-25 NOTE — Progress Notes (Signed)
BP 130/72   Pulse 64   Temp 98.6 F (37 C)   Resp 18   Ht 4' 6.1" (1.374 m)   Wt 115 lb (52.2 kg)   SpO2 97%   BMI 27.63 kg/m    Subjective:    Patient ID: Felicia Robinson, female    DOB: [birth year], 75 y.o.   MRN: [id number]  HPI: Felicia Robinson is a 75 y.o. female  Chief Complaint  Patient presents with   Mood     Patient is here for a follow up on Mood. Patient states the medication has helped her a lot and she is doing much better. Patient states she started out taking it in the evening and states she has since switched it to the morning along with her other medications. Patient states it actually helps her throughout the day.    ANXIETY/STRESS Started on Zoloft 25 MG daily at initial visit on 02/04/21, had noticed a lot of benefit from this.  Lost her spouse unexpectedly in June 2022 -- this caused change in mood, married 51 years.  Her son lives locally (only child), is a good support.   Duration:stable Anxious mood: no -- improved Excessive worrying: no Irritability:  occasional   Sweating: no Nausea: no Palpitations:no Hyperventilation: no Panic attacks: no Agoraphobia: no  Obscessions/compulsions: no Depressed mood:  occasional, improved Depression screen [facility] 2/9 03/25/2021 02/04/2021 10/11/2019 08/17/2019 06/14/2019  Decreased Interest _0 Down, Depressed, Hopeless _1 PHQ - 2 Score _2 Altered sleeping _3 0  Tired, decreased energy _4 Change in appetite 0 0 0 1 0  Feeling bad or failure about yourself  1 0 _5 Trouble concentrating 0 1 0 1 0  Moving slowly or fidgety/restless 0 0 0 0 0  Suicidal thoughts 0 0 0 0 -  PHQ-9 Score _6 Difficult doing work/chores Not difficult at all Not difficult at all Somewhat difficult Somewhat difficult Somewhat difficult  Some recent data might be hidden  Anhedonia: no Weight changes: no Insomnia: occasional difficulty Hypersomnia: no Fatigue/loss of energy:  no Feelings of worthlessness: no Feelings of guilt: no Impaired concentration/indecisiveness: no Suicidal ideations: no  Crying spells: occasional Recent Stressors/Life Changes: yes   Relationship problems: no   Family stress: no     Financial stress: no    Job stress: no    Recent death/loss: yes  GAD 7 : Generalized Anxiety Score 03/25/2021 02/04/2021 08/17/2019 06/14/2019  Nervous, Anxious, on Edge 0 _7 Control/stop worrying 0 0 1 1  Worry too much - different things _8 0  Trouble relaxing 0 0 1 1  Restless 0 0 0 0  Easily annoyed or irritable 0 _9 Afraid - awful might happen 0 0 1 0  Total GAD 7 Score _10 Anxiety Difficulty Not difficult at all Somewhat difficult Somewhat difficult -     ANEMIA WITH CKD 3a Was born with rickets and is seen at [facility] for this, last saw 01/02/21.  Noted to have CKD on recent labs + anemia, H/H 9/31.4, MCV 73 -- she does endorse after loss of husband she did not eat as well.  She does endorse taking Advil at home, 1-2 a day and occasional days not any.    Dr. Audelia Hives started her on  Vit D in September == level was 16.7. Anemia status: stable Etiology of anemia: ?CKD Severity of anemia: mild Fatigue: no Decreased exercise tolerance: no  Dyspnea on exertion: no Palpitations: no Bleeding: no Pica: no   Relevant past medical, surgical, family and social history reviewed and updated as indicated. Interim medical history since our last visit reviewed. Allergies and medications reviewed and updated.  Review of Systems  Constitutional:  Negative for activity change, appetite change, diaphoresis, fatigue and fever.  Respiratory:  Negative for cough, chest tightness and shortness of breath.   Cardiovascular:  Negative for chest pain, palpitations and leg swelling.  Gastrointestinal: Negative.   Endocrine: Negative.   Neurological: Negative.   Psychiatric/Behavioral: Negative.     Per HPI unless specifically indicated above      Objective:    BP 130/72   Pulse 64   Temp 98.6 F (37 C)   Resp 18   Ht 4' 6.1" (1.374 m)   Wt 115 lb (52.2 kg)   SpO2 97%   BMI 27.63 kg/m   Wt Readings from Last 3 Encounters:  03/25/21 115 lb (52.2 kg)  02/04/21 114 lb 9.6 oz (52 kg)  03/02/20 123 lb (55.8 kg)    Physical Exam Vitals and nursing note reviewed.  Constitutional:      General: She is awake. She is not in acute distress.    Appearance: She is well-developed and well-groomed. She is not ill-appearing or toxic-appearing.  HENT:     Head: Normocephalic.     Right Ear: Hearing, tympanic membrane, ear canal and external ear normal.     Left Ear: Hearing, tympanic membrane, ear canal and external ear normal.  Eyes:     General: Lids are normal.        Right eye: No discharge.        Left eye: No discharge.     Conjunctiva/sclera: Conjunctivae normal.     Pupils: Pupils are equal, round, and reactive to light.  Neck:     Vascular: No carotid bruit.  Cardiovascular:     Rate and Rhythm: Normal rate and regular rhythm.     Heart sounds: Normal heart sounds. No murmur heard.   No gallop.  Pulmonary:     Effort: Pulmonary effort is normal. No accessory muscle usage or respiratory distress.     Breath sounds: Normal breath sounds.  Abdominal:     General: Bowel sounds are normal.     Palpations: Abdomen is soft. There is no hepatomegaly or splenomegaly.  Musculoskeletal:     Cervical back: Normal range of motion and neck supple.     Right lower leg: No edema.     Left lower leg: No edema.  Skin:    General: Skin is warm and dry.     Comments: Palmar creases pink  Neurological:     Mental Status: She is alert and oriented to person, place, and time.  Psychiatric:        Attention and Perception: Attention normal.        Mood and Affect: Mood normal.        Speech: Speech normal.        Behavior: Behavior normal. Behavior is cooperative.        Thought Content: Thought content normal.    Results for  orders placed or performed in visit on 02/04/21  CBC with Differential/Platelet  Result Value Ref Range   WBC 8.8 3.4 - 10.8 x10E3/uL   RBC 4.33 3.77 -  5.28 x10E6/uL   Hemoglobin 9.0 (L) 11.1 - 15.9 g/dL   Hematocrit 31.4 (L) 34.0 - 46.6 %   MCV 73 (L) 79 - 97 fL   MCH 20.8 (L) 26.6 - 33.0 pg   MCHC 28.7 (L) 31.5 - 35.7 g/dL   RDW 15.1 11.7 - 15.4 %   Platelets 282 150 - 450 x10E3/uL   Neutrophils 60 Not Estab. %   Lymphs 29 Not Estab. %   Monocytes 7 Not Estab. %   Eos 3 Not Estab. %   Basos 1 Not Estab. %   Neutrophils Absolute 5.3 1.4 - 7.0 x10E3/uL   Lymphocytes Absolute 2.6 0.7 - 3.1 x10E3/uL   Monocytes Absolute 0.6 0.1 - 0.9 x10E3/uL   EOS (ABSOLUTE) 0.3 0.0 - 0.4 x10E3/uL   Basophils Absolute 0.1 0.0 - 0.2 x10E3/uL   Immature Granulocytes 0 Not Estab. %   Immature Grans (Abs) 0.0 0.0 - 0.1 x10E3/uL  Comprehensive metabolic panel  Result Value Ref Range   Glucose 81 70 - 99 mg/dL   BUN 18 8 - 27 mg/dL   Creatinine, Ser 1.05 (H) 0.57 - 1.00 mg/dL   eGFR 56 (L) >59 mL/min/1.73   BUN/Creatinine Ratio 17 12 - 28   Sodium 137 134 - 144 mmol/L   Potassium 4.8 3.5 - 5.2 mmol/L   Chloride 102 96 - 106 mmol/L   CO2 21 20 - 29 mmol/L   Calcium 9.4 8.7 - 10.3 mg/dL   Total Protein 7.3 6.0 - 8.5 g/dL   Albumin 4.8 (H) 3.7 - 4.7 g/dL   Globulin, Total 2.5 1.5 - 4.5 g/dL   Albumin/Globulin Ratio 1.9 1.2 - 2.2   Bilirubin Total 0.3 0.0 - 1.2 mg/dL   Alkaline Phosphatase 200 (H) 44 - 121 IU/L   AST 18 0 - 40 IU/L   ALT 9 0 - 32 IU/L  Lipid Panel w/o Chol/HDL Ratio  Result Value Ref Range   Cholesterol, Total 211 (H) 100 - 199 mg/dL   Triglycerides 207 (H) 0 - 149 mg/dL   HDL 62 >39 mg/dL   VLDL Cholesterol Cal 36 5 - 40 mg/dL   LDL Chol Calc (NIH) 113 (H) 0 - 99 mg/dL  TSH  Result Value Ref Range   TSH 1.070 0.450 - 4.500 uIU/mL  Vitamin B12  Result Value Ref Range   Vitamin B-12 1,371 (H) 232 - 1,245 pg/mL      Assessment & Plan:   Problem List Items Addressed  This Visit       Musculoskeletal and Integument   X-linked hypophosphatemic rickets    Followed by endocrinology at [facility] and receives monthly injections, continue this collaboration.  Recent labs reviewed and notes.  Continue Vit D supplement as recommended by them.          Genitourinary   CKD (chronic kidney disease) stage 3, GFR 30-59 ml/min (HCC) - Primary    Noted on recent labs, appears to fluctuate on review of past labs.  Recommend good hydration at home and cutting back on Advil use when possible.  Recheck next visit.  Refer to nephrology as needed.      Relevant Orders   CBC with Differential/Platelet     Other   Anemia    Noted on recent labs, decline with CBC on labs one year ago.  She does take Advil regularly at home, recommend cutting back on this.  Plan to recheck CBC, iron/ferritin, B12, and send home stool pack with her.  Discussed  at length with her.  She is asymptomatic at this time.  ?related to kidneys vs GI or rickets.  Consider hematology or GI referral is worsening.      Relevant Orders   CBC with Differential/Platelet   Vitamin B12   Iron, TIBC and Ferritin Panel   Fecal occult blood, imunochemical   B12 deficiency    Followed by [facility] endocrinology, recent lab stable -- recheck today and continue supplement.      Grief reaction    Improving at this time with Zoloft on board.  Denies SI/HI.  Continue this medication regimen and adjust as needed.        Vitamin D deficiency    With underlying rickets, followed by [facility] endocrinology, continue supplement and current collaboration.        Follow up plan: Return in about 6 weeks (around 05/06/2021) for Anemia.

## 2021-03-25 NOTE — Assessment & Plan Note (Signed)
Noted on recent labs, appears to fluctuate on review of past labs.  Recommend good hydration at home and cutting back on Advil use when possible.  Recheck next visit.  Refer to nephrology as needed.

## 2021-03-25 NOTE — Patient Instructions (Signed)
Anemia Anemia is a condition in which there is not enough red blood cells or hemoglobin in the blood. Hemoglobin is a substance in red blood cells that carries oxygen. When you do not have enough red blood cells or hemoglobin (are anemic), your body cannot get enough oxygen and your organs may not work properly. As a result, you may feel very tired or have other problems. What are the causes? Common causes of anemia include: Excessive bleeding. Anemia can be caused by excessive bleeding inside or outside the body, including bleeding from the intestines or from heavy menstrual periods in females. Poor nutrition. Long-lasting (chronic) kidney, thyroid, and liver disease. Bone marrow disorders, spleen problems, and blood disorders. Cancer and treatments for cancer. HIV (human immunodeficiency virus) and AIDS (acquired immunodeficiency syndrome). Infections, medicines, and autoimmune disorders that destroy red blood cells. What are the signs or symptoms? Symptoms of this condition include: Minor weakness. Dizziness. Headache, or difficulties concentrating and sleeping. Heartbeats that feel irregular or faster than normal (palpitations). Shortness of breath, especially with exercise. Pale skin, lips, and nails, or cold hands and feet. Indigestion and nausea. Symptoms may occur suddenly or develop slowly. If your anemia is mild, you may not have symptoms. How is this diagnosed? This condition is diagnosed based on blood tests, your medical history, and a physical exam. In some cases, a test may be needed in which cells are removed from the soft tissue inside of a bone and looked at under a microscope (bone marrow biopsy). Your health care provider may also check your stool (feces) for blood and may do additional testing to look for the cause of your bleeding. Other tests may include: Imaging tests, such as a CT scan or MRI. A procedure to see inside your esophagus and stomach (endoscopy). A  procedure to see inside your colon and rectum (colonoscopy). How is this treated? Treatment for this condition depends on the cause. If you continue to lose a lot of blood, you may need to be treated at a hospital. Treatment may include: Taking supplements of iron, vitamin B12, or folic acid. Taking a hormone medicine (erythropoietin) that can help to stimulate red blood cell growth. Having a blood transfusion. This may be needed if you lose a lot of blood. Making changes to your diet. Having surgery to remove your spleen. Follow these instructions at home: Take over-the-counter and prescription medicines only as told by your health care provider. Take supplements only as told by your health care provider. Follow any diet instructions that you were given by your health care provider. Keep all follow-up visits as told by your health care provider. This is important. Contact a health care provider if: You develop new bleeding anywhere in the body. Get help right away if: You are very weak. You are short of breath. You have pain in your abdomen or chest. You are dizzy or feel faint. You have trouble concentrating. You have bloody stools, black stools, or tarry stools. You vomit repeatedly or you vomit up blood. These symptoms may represent a serious problem that is an emergency. Do not wait to see if the symptoms will go away. Get medical help right away. Call your local emergency services (911 in the [place] Do not drive yourself to the hospital. Summary Anemia is a condition in which you do not have enough red blood cells or enough of a substance in your red blood cells that carries oxygen (hemoglobin). Symptoms may occur suddenly or develop slowly. If your anemia is   mild, you may not have symptoms. This condition is diagnosed with blood tests, a medical history, and a physical exam. Other tests may be needed. Treatment for this condition depends on the cause of the anemia. This  information is not intended to replace advice given to you by your health care provider. Make sure you discuss any questions you have with your health care provider. Document Revised: 03/15/2019 Document Reviewed: 03/15/2019 Elsevier Patient Education  2022 Elsevier Inc.  

## 2021-03-25 NOTE — Assessment & Plan Note (Signed)
With underlying rickets, followed by [facility] endocrinology, continue supplement and current collaboration.

## 2021-03-25 NOTE — Assessment & Plan Note (Addendum)
Improving at this time with Zoloft on board.  Denies SI/HI.  Continue this medication regimen and adjust as needed.

## 2021-03-25 NOTE — Assessment & Plan Note (Signed)
Followed by [facility] endocrinology, recent lab stable -- recheck today and continue supplement.

## 2021-03-26 ENCOUNTER — Telehealth: Payer: Self-pay | Admitting: Nurse Practitioner

## 2021-03-26 LAB — IRON,TIBC AND FERRITIN PANEL
Ferritin: 7 ng/mL — ABNORMAL LOW (ref 15–150)
Iron Saturation: 2 % — CL (ref 15–55)
Iron: 13 ug/dL — ABNORMAL LOW (ref 27–139)
Total Iron Binding Capacity: 569 ug/dL (ref 250–450)
UIBC: 556 ug/dL — ABNORMAL HIGH (ref 118–369)

## 2021-03-26 LAB — CBC WITH DIFFERENTIAL/PLATELET
Basophils Absolute: 0.1 10*3/uL (ref 0.0–0.2)
Basos: 1 %
EOS (ABSOLUTE): 0.5 10*3/uL — ABNORMAL HIGH (ref 0.0–0.4)
Eos: 5 %
Hematocrit: 31.3 % — ABNORMAL LOW (ref 34.0–46.6)
Hemoglobin: 8.9 g/dL — ABNORMAL LOW (ref 11.1–15.9)
Immature Grans (Abs): 0 10*3/uL (ref 0.0–0.1)
Immature Granulocytes: 0 %
Lymphocytes Absolute: 2.6 10*3/uL (ref 0.7–3.1)
Lymphs: 25 %
MCH: 20.7 pg — ABNORMAL LOW (ref 26.6–33.0)
MCHC: 28.4 g/dL — ABNORMAL LOW (ref 31.5–35.7)
MCV: 73 fL — ABNORMAL LOW (ref 79–97)
Monocytes Absolute: 0.6 10*3/uL (ref 0.1–0.9)
Monocytes: 5 %
Neutrophils Absolute: 6.4 10*3/uL (ref 1.4–7.0)
Neutrophils: 64 %
Platelets: 298 10*3/uL (ref 150–450)
RBC: 4.29 x10E6/uL (ref 3.77–5.28)
RDW: 15.7 % — ABNORMAL HIGH (ref 11.7–15.4)
WBC: 10.1 10*3/uL (ref 3.4–10.8)

## 2021-03-26 LAB — VITAMIN B12: Vitamin B-12: 1090 pg/mL (ref 232–1245)

## 2021-03-26 MED ORDER — SLOW FE 142 (45 FE) MG PO TBCR: 1.0000 | EXTENDED_RELEASE_TABLET | Freq: Every day | ORAL | 4 refills | Status: DC

## 2021-03-26 NOTE — Telephone Encounter (Signed)
Reviewed labs with patient via telephone -- her H/H has trended down slightly from previous and iron level is 13.  Discussed with her will start Slow Fe one tablet daily, this will cause less constipation issues and recommend she take with a little orange juice daily.  She does have underlying rickets and parathyroid issues.  Has stool sample to obtain to ensure no bleeding from GI, if + will send to GI for further assessment as does take Ibuprofen at baseline for pain.  She denies SOB, fatigue, edema, CP.  Plan for follow-up as scheduled in upcoming weeks and will recheck.  If ongoing discussed with her referral to hematology.

## 2021-03-26 NOTE — Progress Notes (Signed)
Telephone visit 03/26/21

## 2021-05-05 DIAGNOSIS — M810 Age-related osteoporosis without current pathological fracture: Secondary | ICD-10-CM | POA: Insufficient documentation

## 2021-05-06 ENCOUNTER — Other Ambulatory Visit: Payer: Self-pay

## 2021-05-06 ENCOUNTER — Encounter: Payer: Self-pay | Admitting: Nurse Practitioner

## 2021-05-06 ENCOUNTER — Ambulatory Visit: Payer: Medicare PPO | Admitting: Nurse Practitioner

## 2021-05-06 VITALS — BP 128/80 | HR 58 | Temp 98.5°F | Wt 115.2 lb

## 2021-05-06 DIAGNOSIS — D508 Other iron deficiency anemias: Secondary | ICD-10-CM | POA: Diagnosis not present

## 2021-05-06 DIAGNOSIS — M810 Age-related osteoporosis without current pathological fracture: Secondary | ICD-10-CM

## 2021-05-06 DIAGNOSIS — N1831 Chronic kidney disease, stage 3a: Secondary | ICD-10-CM

## 2021-05-06 NOTE — Assessment & Plan Note (Signed)
Noted on recent labs, appears to fluctuate on review of past labs.  Recommend good hydration at home and cutting back on Advil use when possible.  Recheck today.  Refer to nephrology as needed.

## 2021-05-06 NOTE — Assessment & Plan Note (Signed)
Noted on recent labs, started to take iron daily.  She does take Advil regularly at home, recommend cutting back on this.  Recheck CBC, iron/ferritin, and resend home stool pack with her.  Discussed at length with her.  She is asymptomatic at this time.  ?related to kidneys vs GI or rickets.  Consider hematology or GI referral if worsening.

## 2021-05-06 NOTE — Patient Instructions (Signed)
Anemia °Anemia is a condition in which there is not enough red blood cells or hemoglobin in the blood. Hemoglobin is a substance in red blood cells that carries oxygen. °When you do not have enough red blood cells or hemoglobin (are anemic), your body cannot get enough oxygen and your organs may not work properly. As a result, you may feel very tired or have other problems. °What are the causes? °Common causes of anemia include: °Excessive bleeding. Anemia can be caused by excessive bleeding inside or outside the body, including bleeding from the intestines or from heavy menstrual periods in females. °Poor nutrition. °Long-lasting (chronic) kidney, thyroid, and liver disease. °Bone marrow disorders, spleen problems, and blood disorders. °Cancer and treatments for cancer. °HIV (human immunodeficiency virus) and AIDS (acquired immunodeficiency syndrome). °Infections, medicines, and autoimmune disorders that destroy red blood cells. °What are the signs or symptoms? °Symptoms of this condition include: °Minor weakness. °Dizziness. °Headache, or difficulties concentrating and sleeping. °Heartbeats that feel irregular or faster than normal (palpitations). °Shortness of breath, especially with exercise. °Pale skin, lips, and nails, or cold hands and feet. °Indigestion and nausea. °Symptoms may occur suddenly or develop slowly. If your anemia is mild, you may not have symptoms. °How is this diagnosed? °This condition is diagnosed based on blood tests, your medical history, and a physical exam. In some cases, a test may be needed in which cells are removed from the soft tissue inside of a bone and looked at under a microscope (bone marrow biopsy). Your health care provider may also check your stool (feces) for blood and may do additional testing to look for the cause of your bleeding. °Other tests may include: °Imaging tests, such as a CT scan or MRI. °A procedure to see inside your esophagus and stomach (endoscopy). °A  procedure to see inside your colon and rectum (colonoscopy). °How is this treated? °Treatment for this condition depends on the cause. If you continue to lose a lot of blood, you may need to be treated at a hospital. Treatment may include: °Taking supplements of iron, vitamin B12, or folic acid. °Taking a hormone medicine (erythropoietin) that can help to stimulate red blood cell growth. °Having a blood transfusion. This may be needed if you lose a lot of blood. °Making changes to your diet. °Having surgery to remove your spleen. °Follow these instructions at home: °Take over-the-counter and prescription medicines only as told by your health care provider. °Take supplements only as told by your health care provider. °Follow any diet instructions that you were given by your health care provider. °Keep all follow-up visits as told by your health care provider. This is important. °Contact a health care provider if: °You develop new bleeding anywhere in the body. °Get help right away if: °You are very weak. °You are short of breath. °You have pain in your abdomen or chest. °You are dizzy or feel faint. °You have trouble concentrating. °You have bloody stools, black stools, or tarry stools. °You vomit repeatedly or you vomit up blood. °These symptoms may represent a serious problem that is an emergency. Do not wait to see if the symptoms will go away. Get medical help right away. Call your local emergency services (911 in the [place] Do not drive yourself to the hospital. °Summary °Anemia is a condition in which you do not have enough red blood cells or enough of a substance in your red blood cells that carries oxygen (hemoglobin). °Symptoms may occur suddenly or develop slowly. °If your anemia is   mild, you may not have symptoms. °This condition is diagnosed with blood tests, a medical history, and a physical exam. Other tests may be needed. °Treatment for this condition depends on the cause of the anemia. °This  information is not intended to replace advice given to you by your health care provider. Make sure you discuss any questions you have with your health care provider. °Document Revised: 03/15/2019 Document Reviewed: 03/15/2019 °Elsevier Patient Education © 2022 [employer] Inc. ° °

## 2021-05-06 NOTE — Progress Notes (Signed)
BP 128/80 (BP Location: Left Arm)    Pulse (!) 58    Temp 98.5 F (36.9 C) (Oral)    Wt 115 lb 3.2 oz (52.3 kg)    SpO2 98%    BMI 27.67 kg/m    Subjective:    Patient ID: [name], female    DOB: Feb 27, 1946, 76 y.o.   MRN: [id number]  HPI: Felicia Robinson is a 76 y.o. female  Chief Complaint  Patient presents with   Anemia    Patient is here for follow up on Anemia. Patient denies having any concerns at today's visit. Patient states she is doing well with everything. Patient states she just has a rough past year as she is helping dealing with her sibling's health related issues along with her husband passing in June.    ANEMIA Follow-up visit today for anemia noted on recent labs with H/H 8.9/31.3, MCV 73, B12 1090. Did not return stool for check due to being busy with her brother's health.  Started Slow Fe 1 tablet daily at this time and takes daily B12 at baseline.  Has underlying Rickets followed by endo at [facility] and last seen 01/02/21.  Also noted to have CKD 3a on recent labs. Anemia status: stable Etiology of anemia: unknown -- ?CKD Duration of anemia treatment: months Compliance with treatment: good compliance Iron supplementation side effects: no Severity of anemia: moderate Fatigue: no Decreased exercise tolerance: no  Dyspnea on exertion: no Palpitations: no Bleeding: no Pica: no  Iron/TIBC/Ferritin/ %Sat    Component Value Date/Time   IRON 13 (L) 03/25/2021 1442   TIBC 569 (HH) 03/25/2021 1442   FERRITIN 7 (L) 03/25/2021 1442   IRONPCTSAT 2 (LL) 03/25/2021 1442    Relevant past medical, surgical, family and social history reviewed and updated as indicated. Interim medical history since our last visit reviewed. Allergies and medications reviewed and updated.  Review of Systems  Constitutional:  Negative for activity change, appetite change, diaphoresis, fatigue and fever.  Respiratory:  Negative for cough, chest tightness and shortness of breath.    Cardiovascular:  Negative for chest pain, palpitations and leg swelling.  Gastrointestinal: Negative.   Endocrine: Negative.   Neurological: Negative.   Psychiatric/Behavioral: Negative.     Per HPI unless specifically indicated above     Objective:    BP 128/80 (BP Location: Left Arm)    Pulse (!) 58    Temp 98.5 F (36.9 C) (Oral)    Wt 115 lb 3.2 oz (52.3 kg)    SpO2 98%    BMI 27.67 kg/m   Wt Readings from Last 3 Encounters:  05/06/21 115 lb 3.2 oz (52.3 kg)  03/25/21 115 lb (52.2 kg)  02/04/21 114 lb 9.6 oz (52 kg)    Physical Exam Vitals and nursing note reviewed.  Constitutional:      General: She is awake. She is not in acute distress.    Appearance: She is well-developed and well-groomed. She is not ill-appearing or toxic-appearing.  HENT:     Head: Normocephalic.     Right Ear: Hearing, tympanic membrane, ear canal and external ear normal.     Left Ear: Hearing, tympanic membrane, ear canal and external ear normal.  Eyes:     General: Lids are normal.        Right eye: No discharge.        Left eye: No discharge.     Conjunctiva/sclera: Conjunctivae normal.     Pupils: Pupils are equal,  round, and reactive to light.     Comments: Inner lower lid with no paleness.  Neck:     Vascular: No carotid bruit.  Cardiovascular:     Rate and Rhythm: Normal rate and regular rhythm.     Heart sounds: Normal heart sounds. No murmur heard.   No gallop.  Pulmonary:     Effort: Pulmonary effort is normal. No accessory muscle usage or respiratory distress.     Breath sounds: Normal breath sounds.  Abdominal:     General: Bowel sounds are normal.     Palpations: Abdomen is soft. There is no hepatomegaly or splenomegaly.  Musculoskeletal:     Cervical back: Normal range of motion and neck supple.     Right lower leg: No edema.     Left lower leg: No edema.  Skin:    General: Skin is warm and dry.     Comments: Palmar creases pink  Neurological:     Mental Status: She is  alert and oriented to person, place, and time.  Psychiatric:        Attention and Perception: Attention normal.        Mood and Affect: Mood normal.        Speech: Speech normal.        Behavior: Behavior normal. Behavior is cooperative.        Thought Content: Thought content normal.   Results for orders placed or performed in visit on 03/25/21  CBC with Differential/Platelet  Result Value Ref Range   WBC 10.1 3.4 - 10.8 x10E3/uL   RBC 4.29 3.77 - 5.28 x10E6/uL   Hemoglobin 8.9 (L) 11.1 - 15.9 g/dL   Hematocrit 31.3 (L) 34.0 - 46.6 %   MCV 73 (L) 79 - 97 fL   MCH 20.7 (L) 26.6 - 33.0 pg   MCHC 28.4 (L) 31.5 - 35.7 g/dL   RDW 15.7 (H) 11.7 - 15.4 %   Platelets 298 150 - 450 x10E3/uL   Neutrophils 64 Not Estab. %   Lymphs 25 Not Estab. %   Monocytes 5 Not Estab. %   Eos 5 Not Estab. %   Basos 1 Not Estab. %   Neutrophils Absolute 6.4 1.4 - 7.0 x10E3/uL   Lymphocytes Absolute 2.6 0.7 - 3.1 x10E3/uL   Monocytes Absolute 0.6 0.1 - 0.9 x10E3/uL   EOS (ABSOLUTE) 0.5 (H) 0.0 - 0.4 x10E3/uL   Basophils Absolute 0.1 0.0 - 0.2 x10E3/uL   Immature Granulocytes 0 Not Estab. %   Immature Grans (Abs) 0.0 0.0 - 0.1 x10E3/uL  Vitamin B12  Result Value Ref Range   Vitamin B-12 1,090 232 - 1,245 pg/mL  Iron, TIBC and Ferritin Panel  Result Value Ref Range   Total Iron Binding Capacity 569 (HH) 250 - 450 ug/dL   UIBC 556 (H) 118 - 369 ug/dL   Iron 13 (L) 27 - 139 ug/dL   Iron Saturation 2 (LL) 15 - 55 %   Ferritin 7 (L) 15 - 150 ng/mL      Assessment & Plan:   Problem List Items Addressed This Visit       Genitourinary   CKD (chronic kidney disease) stage 3, GFR 30-59 ml/min (HCC) - Primary    Noted on recent labs, appears to fluctuate on review of past labs.  Recommend good hydration at home and cutting back on Advil use when possible.  Recheck today.  Refer to nephrology as needed.      Relevant Orders   Basic  metabolic panel     Other   Anemia    Noted on recent labs,  started to take iron daily.  She does take Advil regularly at home, recommend cutting back on this.  Recheck CBC, iron/ferritin, and resend home stool pack with her.  Discussed at length with her.  She is asymptomatic at this time.  ?related to kidneys vs GI or rickets.  Consider hematology or GI referral if worsening.      Relevant Orders   CBC with Differential/Platelet   Basic metabolic panel   Iron Binding Cap (TIBC)([facility])   Ferritin     Follow up plan: Return in about 6 months (around 11/03/2021) for ANEMIA, HTN/HLD, RICKETS, VIT D.

## 2021-05-07 LAB — IRON AND TIBC
Iron Saturation: 6 % — CL (ref 15–55)
Iron: 32 ug/dL (ref 27–139)
Total Iron Binding Capacity: 524 ug/dL — ABNORMAL HIGH (ref 250–450)
UIBC: 492 ug/dL — ABNORMAL HIGH (ref 118–369)

## 2021-05-07 LAB — CBC WITH DIFFERENTIAL/PLATELET
Basophils Absolute: 0.1 10*3/uL (ref 0.0–0.2)
Basos: 1 %
EOS (ABSOLUTE): 0.5 10*3/uL — ABNORMAL HIGH (ref 0.0–0.4)
Eos: 5 %
Hematocrit: 39 % (ref 34.0–46.6)
Hemoglobin: 12.1 g/dL (ref 11.1–15.9)
Immature Grans (Abs): 0 10*3/uL (ref 0.0–0.1)
Immature Granulocytes: 0 %
Lymphocytes Absolute: 2.4 10*3/uL (ref 0.7–3.1)
Lymphs: 25 %
MCH: 24.9 pg — ABNORMAL LOW (ref 26.6–33.0)
MCHC: 31 g/dL — ABNORMAL LOW (ref 31.5–35.7)
MCV: 80 fL (ref 79–97)
Monocytes Absolute: 0.6 10*3/uL (ref 0.1–0.9)
Monocytes: 6 %
Neutrophils Absolute: 5.7 10*3/uL (ref 1.4–7.0)
Neutrophils: 63 %
Platelets: 240 10*3/uL (ref 150–450)
RBC: 4.86 x10E6/uL (ref 3.77–5.28)
WBC: 9.3 10*3/uL (ref 3.4–10.8)

## 2021-05-07 LAB — BASIC METABOLIC PANEL
BUN/Creatinine Ratio: 23 (ref 12–28)
BUN: 19 mg/dL (ref 8–27)
CO2: 21 mmol/L (ref 20–29)
Calcium: 9 mg/dL (ref 8.7–10.3)
Chloride: 100 mmol/L (ref 96–106)
Creatinine, Ser: 0.81 mg/dL (ref 0.57–1.00)
Glucose: 87 mg/dL (ref 70–99)
Potassium: 5 mmol/L (ref 3.5–5.2)
Sodium: 136 mmol/L (ref 134–144)
eGFR: 76 mL/min/{1.73_m2} (ref >59)

## 2021-05-07 LAB — FERRITIN: Ferritin: 26 ng/mL (ref 15–150)

## 2021-05-07 NOTE — Progress Notes (Signed)
Contacted via [facility] morning Lakie, your labs have returned and overall your anemia is improving with improvement in iron level and hemoglobin and hematocrit.  At this time continue iron supplement daily.  Kidney function, creatinine and eGFR, is also improved these labs.  Any questions?  Great news for the day!! Keep being inspiring!!  Thank you for allowing me to participate in your care.  I appreciate you. Kindest regards, Sonnie Bias

## 2021-05-09 ENCOUNTER — Other Ambulatory Visit: Payer: Self-pay | Admitting: Nurse Practitioner

## 2021-05-14 ENCOUNTER — Ambulatory Visit (INDEPENDENT_AMBULATORY_CARE_PROVIDER_SITE_OTHER): Payer: Medicare PPO | Admitting: *Deleted

## 2021-05-14 DIAGNOSIS — Z0000 Encounter for general adult medical examination without abnormal findings: Secondary | ICD-10-CM

## 2021-05-14 NOTE — Patient Instructions (Signed)
Ms. Felicia Robinson , Thank you for taking time to come for your Medicare Wellness Visit. I appreciate your ongoing commitment to your health goals. Please review the following plan we discussed and let me know if I can assist you in the future.   Screening recommendations/referrals: Colonoscopy: Education provided Mammogram: Education provided Bone Density: Education provided Recommended yearly ophthalmology/optometry visit for glaucoma screening and checkup Recommended yearly dental visit for hygiene and checkup  Vaccinations: Influenza vaccine: up to date Pneumococcal vaccine: up to date Tdap vaccine: Education provided Shingles vaccine: Education provided    Advanced directives: on file  Conditions/risks identified:   Next appointment: 11-05-2021 @ 1:00 [place] 65 Years and Older, Female Preventive care refers to lifestyle choices and visits with your health care provider that can promote health and wellness. What does preventive care include? A yearly physical exam. This is also called an annual well check. Dental exams once or twice a year. Routine eye exams. Ask your health care provider how often you should have your eyes checked. Personal lifestyle choices, including: Daily care of your teeth and gums. Regular physical activity. Eating a healthy diet. Avoiding tobacco and drug use. Limiting alcohol use. Practicing safe sex. Taking low-dose aspirin every day. Taking vitamin and mineral supplements as recommended by your health care provider. What happens during an annual well check? The services and screenings done by your health care provider during your annual well check will depend on your age, overall health, lifestyle risk factors, and family history of disease. Counseling  Your health care provider may ask you questions about your: Alcohol use. Tobacco use. Drug use. Emotional well-being. Home and relationship well-being. Sexual activity. Eating  habits. History of falls. Memory and ability to understand (cognition). Work and work Statistician. Reproductive health. Screening  You may have the following tests or measurements: Height, weight, and BMI. Blood pressure. Lipid and cholesterol levels. These may be checked every 5 years, or more frequently if you are over 62 years old. Skin check. Lung cancer screening. You may have this screening every year starting at age 53 if you have a 30-pack-year history of smoking and currently smoke or have quit within the past 15 years. Fecal occult blood test (FOBT) of the stool. You may have this test every year starting at age 38. Flexible sigmoidoscopy or colonoscopy. You may have a sigmoidoscopy every 5 years or a colonoscopy every 10 years starting at age 81. Hepatitis C blood test. Hepatitis B blood test. Sexually transmitted disease (STD) testing. Diabetes screening. This is done by checking your blood sugar (glucose) after you have not eaten for a while (fasting). You may have this done every 1-3 years. Bone density scan. This is done to screen for osteoporosis. You may have this done starting at age 62. Mammogram. This may be done every 1-2 years. Talk to your health care provider about how often you should have regular mammograms. Talk with your health care provider about your test results, treatment options, and if necessary, the need for more tests. Vaccines  Your health care provider may recommend certain vaccines, such as: Influenza vaccine. This is recommended every year. Tetanus, diphtheria, and acellular pertussis (Tdap, Td) vaccine. You may need a Td booster every 10 years. Zoster vaccine. You may need this after age 21. Pneumococcal 13-valent conjugate (PCV13) vaccine. One dose is recommended after age 25. Pneumococcal polysaccharide (PPSV23) vaccine. One dose is recommended after age 82. Talk to your health care provider about which screenings and  vaccines you need and how  often you need them. This information is not intended to replace advice given to you by your health care provider. Make sure you discuss any questions you have with your health care provider. Document Released: 05/04/2015 Document Revised: 12/26/2015 Document Reviewed: 02/06/2015 Elsevier Interactive Patient Education  2017 [employer] Prevention in the Home Falls can cause injuries. They can happen to people of all ages. There are many things you can do to make your home safe and to help prevent falls. What can I do on the outside of my home? Regularly fix the edges of walkways and driveways and fix any cracks. Remove anything that might make you trip as you walk through a door, such as a raised step or threshold. Trim any bushes or trees on the path to your home. Use bright outdoor lighting. Clear any walking paths of anything that might make someone trip, such as rocks or tools. Regularly check to see if handrails are loose or broken. Make sure that both sides of any steps have handrails. Any raised decks and porches should have guardrails on the edges. Have any leaves, snow, or ice cleared regularly. Use sand or salt on walking paths during winter. Clean up any spills in your garage right away. This includes oil or grease spills. What can I do in the bathroom? Use night lights. Install grab bars by the toilet and in the tub and shower. Do not use towel bars as grab bars. Use non-skid mats or decals in the tub or shower. If you need to sit down in the shower, use a plastic, non-slip stool. Keep the floor dry. Clean up any water that spills on the floor as soon as it happens. Remove soap buildup in the tub or shower regularly. Attach bath mats securely with double-sided non-slip rug tape. Do not have throw rugs and other things on the floor that can make you trip. What can I do in the bedroom? Use night lights. Make sure that you have a light by your bed that is easy to  reach. Do not use any sheets or blankets that are too big for your bed. They should not hang down onto the floor. Have a firm chair that has side arms. You can use this for support while you get dressed. Do not have throw rugs and other things on the floor that can make you trip. What can I do in the kitchen? Clean up any spills right away. Avoid walking on wet floors. Keep items that you use a lot in easy-to-reach places. If you need to reach something above you, use a strong step stool that has a grab bar. Keep electrical cords out of the way. Do not use floor polish or wax that makes floors slippery. If you must use wax, use non-skid floor wax. Do not have throw rugs and other things on the floor that can make you trip. What can I do with my stairs? Do not leave any items on the stairs. Make sure that there are handrails on both sides of the stairs and use them. Fix handrails that are broken or loose. Make sure that handrails are as long as the stairways. Check any carpeting to make sure that it is firmly attached to the stairs. Fix any carpet that is loose or worn. Avoid having throw rugs at the top or bottom of the stairs. If you do have throw rugs, attach them to the floor with carpet tape. Make  sure that you have a light switch at the top of the stairs and the bottom of the stairs. If you do not have them, ask someone to add them for you. What else can I do to help prevent falls? Wear shoes that: Do not have high heels. Have rubber bottoms. Are comfortable and fit you well. Are closed at the toe. Do not wear sandals. If you use a stepladder: Make sure that it is fully opened. Do not climb a closed stepladder. Make sure that both sides of the stepladder are locked into place. Ask someone to hold it for you, if possible. Clearly mark and make sure that you can see: Any grab bars or handrails. First and last steps. Where the edge of each step is. Use tools that help you move  around (mobility aids) if they are needed. These include: Canes. Walkers. Scooters. Crutches. Turn on the lights when you go into a dark area. Replace any light bulbs as soon as they burn out. Set up your furniture so you have a clear path. Avoid moving your furniture around. If any of your floors are uneven, fix them. If there are any pets around you, be aware of where they are. Review your medicines with your doctor. Some medicines can make you feel dizzy. This can increase your chance of falling. Ask your doctor what other things that you can do to help prevent falls. This information is not intended to replace advice given to you by your health care provider. Make sure you discuss any questions you have with your health care provider. Document Released: 02/01/2009 Document Revised: 09/13/2015 Document Reviewed: 05/12/2014 Elsevier Interactive Patient Education  2017 [employer].

## 2021-05-14 NOTE — Progress Notes (Signed)
Subjective:   Felicia Robinson is a 76 y.o. female who presents for Medicare Annual (Subsequent) preventive examination..  I connected with  Radhika Dershem on 05/14/21 by a telephone enabled telemedicine application and verified that I am speaking with the correct person using two identifiers.   I discussed the limitations of evaluation and management by telemedicine. The patient expressed understanding and agreed to proceed.  Patient location: home  Provider location: in office     Review of Systems     Cardiac Risk Factors include: advanced age (>77mn, >>60women);hypertension;sedentary lifestyle     Objective:    Today's Vitals   There is no height or weight on file to calculate BMI.  Advanced Directives 05/14/2021 09/28/2019 11/16/2018 11/10/2017 10/15/2016 06/03/2016 01/30/2016  Does Patient Have a Medical Advance Directive? Yes No _0   Type of APr[place]of [place]Living will [place]Living will HSt. [place]Living will HDef[place]Living will [place]nowvilleLiving will  Does patient want to make changes to medical advance directive? - - - - - - No - Patient declined  Copy of [place] Chart? Yes - validated most recent copy scanned in chart (See row information) - No - copy requested No - copy requested Yes - Yes  Would patient like information on creating a medical advance directive? - No - Patient declined - - - - -    Current Medications (verified) Outpatient Encounter Medications as of 05/14/2021  Medication Sig   aspirin 81 MG chewable tablet Chew 81 mg by mouth daily.   Burosumab-twza (CRYSVITA) 30 MG/ML SOLN Inject 30 mg into the skin every 30 (thirty) days.   busPIRone (BUSPAR) 5 MG tablet Take 1 tablet (5 mg total) by mouth 3 (three) times daily as needed.   Cholecalciferol (VITAMIN D3) 50 MCG  (2000 UT) TABS Take 2,000 Units by mouth daily.   Cyanocobalamin (VITAMIN B12 PO) Take 3,000 mcg by mouth daily.   Ferrous Sulfate (SLOW FE) 142 (45 Fe) MG TBCR Take 1 tablet by mouth daily. Take with a little orange juice.   ibuprofen (ADVIL) 200 MG tablet Take 200 mg by mouth every 6 (six) hours as needed.   Nebivolol HCl 20 MG TABS TAKE 1 TABLET(20 MG) BY MOUTH DAILY   olmesartan (BENICAR) 40 MG tablet Take 1 tablet (40 mg total) by mouth daily.   rosuvastatin (CRESTOR) 5 MG tablet TAKE 1 TABLET(5 MG) BY MOUTH DAILY   sertraline (ZOLOFT) 25 MG tablet TAKE 1 TABLET(25 MG) BY MOUTH DAILY   No facility-administered encounter medications on file as of 05/14/2021.    Allergies (verified) Allopurinol, Codeine, Other, and Shellfish allergy   History: Past Medical History:  Diagnosis Date   [facility]   [facility] breast   [facility]   L [facility]   Disorders of phosphorus metabolism    rickets   Fibroadenosis of breast    Gout 2013   Hypertension 2009   Malignant neoplasm of upper-outer quadrant of female breast ([facility] 09/30/2011   7 mm low-grade invasive mammary carcinoma with focal DCIS. ER 90%, PR 1%, HER-2/neu not overexpressing.   Osteomalacia    Shingles 2013   Past Surgical History:  Procedure Laterality Date   BREAST SURGERY Left 09/30/2011   Left simple mastectomy with sentinel node biopsy   CATARACT [facility]TRA[facility], BILATERAL [facility]  COLONOSCOPY  2012   DILATION AND CURETTAGE OF UTERUS  1982   MASTECTOMY Left 2013   with SN biopsy   TONSILLECTOMY  1953   UPPER GI ENDOSCOPY  2012   Family History  Problem Relation Age of Onset   Alzheimer's disease Mother    Stroke Father    Lymphoma Brother 39       Hodgkin's Lymphoma   Cancer Brother        non hodgkins lymphoma    Heart disease Brother    Rickets Son    Cancer Maternal Uncle 15       cancer of the esophagus   Heart disease Maternal Grandfather    Breast  cancer Neg Hx    Social History   Socioeconomic History   Marital status: Widowed    Spouse name: Sonny   Number of children: 1   Years of education: some college   Highest education level: 12th grade  Occupational History   Occupation: Retired  Tobacco Use   Smoking status: Former    Packs/day: 0.50    Years: 30.00    Pack years: 15.00    Types: Cigarettes    Quit date: 04/21/2006    Years since quitting: 15.0   Smokeless tobacco: Never  Vaping Use   Vaping Use: Never used  Substance and Sexual Activity   Alcohol use: Yes    Alcohol/week: 1.0 standard drink    Types: 1 Glasses of wine per week   Drug use: No   Sexual activity: Not Currently  Other Topics Concern   Not on file  Social History Narrative   Not on file   Social Determinants of Health   Financial Resource Strain: Low Risk    Difficulty of Paying Living Expenses: Not hard at all  Food Insecurity: No Food Insecurity   Worried About Charity fundraiser in the Last Year: Never true   [place] in the Last Year: Never true  Transportation Needs: No Transportation Needs   Lack of Transportation (Medical): No   Lack of Transportation (Non-Medical): No  Physical Activity: Inactive   Days of Exercise per Week: 0 days   Minutes of Exercise per Session: 0 min  Stress: No Stress Concern Present   Feeling of Stress : Only a little  Social Connections: Moderately Isolated   Frequency of Communication with Friends and Family: Three times a week   Frequency of Social Gatherings with Friends and Family: Twice a week   Attends Religious Services: More than 4 times per year   Active Member of [employer] or Organizations: No   Attends Archivist Meetings: Never   Marital Status: Widowed    Tobacco Counseling Counseling given: Not Answered   Clinical Intake:  Pre-visit preparation completed: Yes  Pain : No/denies pain     Nutritional Risks: None Diabetes: No  How often do you need to have  someone help you when you read instructions, pamphlets, or other written materials from your doctor or pharmacy?: 1 - Never  Diabetic?  no  Interpreter Needed?: No  Information entered by :: Leroy Kennedy LPN   Activities of Daily Living In your present state of health, do you have any difficulty performing the following activities: 05/14/2021 02/04/2021  Hearing? Tempie Donning  Vision? N N  Difficulty concentrating or making decisions? N Y  Walking or climbing stairs? N Y  Dressing or bathing? N N  Doing errands, shopping? N N  Conservation officer, nature and  eating ? N -  Using the Toilet? N -  In the past six months, have you accidently leaked urine? Y -  Do you have problems with loss of bowel control? N -  Managing your Medications? N -  Managing your Finances? N -  Housekeeping or managing your Housekeeping? N -  Some recent data might be hidden    Patient Care Team: Venita Lick, NP as PCP - General (Nurse Practitioner) Robert Bellow, MD (General Surgery) Lona Millard, MD as Consulting Physician (Endocrinology) Bary Castilla Forest Gleason, MD as Consulting Physician (General Surgery) Hessie Knows, MD as Consulting Physician (Orthopedic Surgery)  Indicate any recent Medical Services you may have received from other than [facility] providers in the past year (date may be approximate).     Assessment:   This is a routine wellness examination for [place].  Hearing/Vision screen Hearing Screening - Comments:: A little trouble no Hearing aids Vision Screening - Comments:: Not up to date Dr. Gloriann Loan  Dietary issues and exercise activities discussed: Current Exercise Habits: The patient does not participate in regular exercise at present   Goals Addressed             This Visit's Progress    DIET - INCREASE WATER INTAKE   On track    Recommend to drink at least 6-8 8oz glasses of water per day.     Patient Stated       Maintain current lifestyle       Depression Screen PHQ 2/9 Scores  05/14/2021 05/06/2021 03/25/2021 02/04/2021 10/11/2019 08/17/2019 06/14/2019  PHQ - 2 Score _0 PHQ- 9 Score _1 Fall Risk Fall Risk  05/14/2021 05/06/2021 02/04/2021 10/11/2019 08/17/2019  Falls in the past year? - - 0 1 1  Number falls in past yr: 0 0 0 1 1  Comment - - - - 10 or 11  Injury with Fall? 0 0 0 1 1  Risk Factor Category  - - - - -  Risk for fall due to : - No Fall Risks No Fall Risks History of fall(s);Impaired balance/gait;Impaired mobility;Orthopedic patient History of fall(s);Impaired balance/gait  Risk for fall due to: Comment - - - - -  Follow up Falls evaluation completed Falls evaluation completed Falls evaluation completed - -    FALL RISK PREVENTION PERTAINING TO THE HOME:  Any stairs in or around the home? No  If so, are there any without handrails? No  Home free of loose throw rugs in walkways, pet beds, electrical cords, etc? Yes  Adequate lighting in your home to reduce risk of falls? Yes   ASSISTIVE DEVICES UTILIZED TO PREVENT FALLS:  Life alert? No  Use of a cane, walker or w/c? Yes  Grab bars in the bathroom? Yes  Shower chair or bench in shower? Yes  Elevated toilet seat or a handicapped toilet? No   TIMED UP AND GO:  Was the test performed? No .    Cognitive Function:  Normal cognitive status assessed by direct observation by this Nurse Health Advisor. No abnormalities found.       6CIT Screen 11/16/2018 11/10/2017  What Year? 0 points 0 points  What month? 0 points 0 points  What time? 0 points 0 points  Count back from 20 0 points 0 points  Months in reverse 0 points 0 points  Repeat phrase 0 points 0 points  Total Score 0 0  Immunizations Immunization History  Administered Date(s) Administered   Fluad Quad(high Dose 65+) 02/16/2019, 02/04/2021   Influenza, High Dose Seasonal PF 01/29/2015, 01/30/2016, 02/23/2017, 03/16/2018   Influenza-Unspecified 01/12/2014   PFIZER(Purple Top)SARS-COV-2 Vaccination  06/13/2019, 07/04/2019, 04/04/2020   Pneumococcal Conjugate-13 01/29/2015   Pneumococcal Polysaccharide-23 05/01/2011, 06/03/2016   Tdap 09/20/2009    TDAP status: Due, Education has been provided regarding the importance of this vaccine. Advised may receive this vaccine at local pharmacy or Health Dept. Aware to provide a copy of the vaccination record if obtained from local pharmacy or Health Dept. Verbalized acceptance and understanding.  Flu Vaccine status: Up to date  Pneumococcal vaccine status: Up to date  Covid-19 vaccine status: Information provided on how to obtain vaccines.   Qualifies for Shingles Vaccine? Yes   Zostavax completed No   Shingrix Completed?: No.    Education has been provided regarding the importance of this vaccine. Patient has been advised to call insurance company to determine out of pocket expense if they have not yet received this vaccine. Advised may also receive vaccine at local pharmacy or Health Dept. Verbalized acceptance and understanding.  Screening Tests Health Maintenance  Topic Date Due   Zoster Vaccines- Shingrix (1 of 2) Never done   DEXA SCAN  08/18/2014   MAMMOGRAM  10/28/2019   COVID-19 Vaccine (4 - Booster for Pfizer series) 05/30/2020   TETANUS/TDAP  02/04/2022 (Originally 09/21/2019)   Fecal DNA (Cologuard)  02/04/2022 (Originally 08/20/1996)   Pneumonia Vaccine 84+ Years old  Completed   INFLUENZA VACCINE  Completed   Hepatitis C Screening  Completed   HPV VACCINES  Aged Out    Health Maintenance  Health Maintenance Due  Topic Date Due   Zoster Vaccines- Shingrix (1 of 2) Never done   DEXA SCAN  08/18/2014   MAMMOGRAM  10/28/2019   COVID-19 Vaccine (4 - Booster for Elliott series) 05/30/2020    Colorectal cancer screening: Type of screening: Colonoscopy. Completed 2012. Repeat every 10 years     will discuss with Cannady at upcoming appointment  Mammogram status: Ordered  . Pt provided with contact info and advised to call to  schedule appt.   Bone Density status: Ordered  . Pt provided with contact info and advised to call to schedule appt.  Lung Cancer Screening: (Low Dose CT Chest recommended if Age 44-80 years, 30 pack-year currently smoking OR have quit w/in 15years.) does not qualify.   Lung Cancer Screening Referral:   Additional Screening:  Hepatitis C Screening: does not qualify; Completed 2014  Vision Screening: Recommended annual ophthalmology exams for early detection of glaucoma and other disorders of the eye. Is the patient up to date with their annual eye exam?  Yes  Who is the provider or what is the name of the office in which the patient attends annual eye exams? Dr. Gloriann Loan  If pt is not established with a provider, would they like to be referred to a provider to establish care? No .   Dental Screening: Recommended annual dental exams for proper oral hygiene  Community Resource Referral / Chronic Care Management: CRR required this visit?  No   CCM required this visit?  No      Plan:     I have personally reviewed and noted the following in the patients chart:   Medical and social history Use of alcohol, tobacco or illicit drugs  Current medications and supplements including opioid prescriptions.  Functional ability and status Nutritional status Physical activity Advanced directives  List of other physicians Hospitalizations, surgeries, and ER visits in previous 12 months Vitals Screenings to include cognitive, depression, and falls Referrals and appointments  In addition, I have reviewed and discussed with patient certain preventive protocols, quality metrics, and best practice recommendations. A written personalized care plan for preventive services as well as general preventive health recommendations were provided to patient.     Leroy Kennedy, LPN   01/13/2414   Nurse Notes:

## 2021-05-28 ENCOUNTER — Other Ambulatory Visit: Payer: Self-pay | Admitting: Nurse Practitioner

## 2021-05-28 DIAGNOSIS — I10 Essential (primary) hypertension: Secondary | ICD-10-CM

## 2021-05-30 ENCOUNTER — Other Ambulatory Visit: Payer: Self-pay | Admitting: Family

## 2021-05-30 DIAGNOSIS — I10 Essential (primary) hypertension: Secondary | ICD-10-CM

## 2021-08-08 ENCOUNTER — Other Ambulatory Visit: Payer: Self-pay | Admitting: Nurse Practitioner

## 2021-08-08 DIAGNOSIS — E782 Mixed hyperlipidemia: Secondary | ICD-10-CM

## 2021-10-17 ENCOUNTER — Ambulatory Visit
Admission: RE | Admit: 2021-10-17 | Discharge: 2021-10-17 | Disposition: A | Discharge status: Home / Self Care | Payer: Medicare PPO | Source: Ambulatory Visit | Attending: Nurse Practitioner | Admitting: Nurse Practitioner

## 2021-10-17 DIAGNOSIS — Z1231 Encounter for screening mammogram for malignant neoplasm of breast: Secondary | ICD-10-CM | POA: Insufficient documentation

## 2021-11-02 NOTE — Patient Instructions (Incomplete)
Please call to schedule your mammogram and/or bone density: [facility]  Address: [address], [place], [place] [address] Phone: [phone]   DASH Eating Plan DASH stands for Dietary Approaches to Stop Hypertension. The DASH eating plan is a healthy eating plan that has been shown to: Reduce high blood pressure (hypertension). Reduce your risk for type 2 diabetes, heart disease, and stroke. Help with weight loss. What are tips for following this plan? Reading food labels Check food labels for the amount of salt (sodium) per serving. Choose foods with less than 5 percent of the Daily Value of sodium. Generally, foods with less than 300 milligrams (mg) of sodium per serving fit into this eating plan. To find whole grains, look for the word "whole" as the first word in the ingredient list. Shopping Buy products labeled as "low-sodium" or "no salt added." Buy fresh foods. Avoid canned foods and pre-made or frozen meals. Cooking Avoid adding salt when cooking. Use salt-free seasonings or herbs instead of table salt or sea salt. Check with your health care provider or pharmacist before using salt substitutes. Do not fry foods. Cook foods using healthy methods such as baking, boiling, grilling, roasting, and broiling instead. Cook with heart-healthy oils, such as olive, canola, avocado, soybean, or sunflower oil. Meal planning  Eat a balanced diet that includes: 4 or more servings of fruits and 4 or more servings of vegetables each day. Try to fill one-half of your plate with fruits and vegetables. 6-8 servings of whole grains each day. Less than 6 oz (170 g) of lean meat, poultry, or fish each day. A 3-oz (85-g) serving of meat is about the same size as a deck of cards. One egg equals 1 oz (28 g). 2-3 servings of low-fat dairy each day. One serving is 1 cup (237 mL). 1 serving of nuts, seeds, or beans 5 times each week. 2-3 servings of  heart-healthy fats. Healthy fats called omega-3 fatty acids are found in foods such as walnuts, flaxseeds, fortified milks, and eggs. These fats are also found in cold-water fish, such as sardines, salmon, and mackerel. Limit how much you eat of: Canned or prepackaged foods. Food that is high in trans fat, such as some fried foods. Food that is high in saturated fat, such as fatty meat. Desserts and other sweets, sugary drinks, and other foods with added sugar. Full-fat dairy products. Do not salt foods before eating. Do not eat more than 4 egg yolks a week. Try to eat at least 2 vegetarian meals a week. Eat more home-cooked food and less restaurant, buffet, and fast food. Lifestyle When eating at a restaurant, ask that your food be prepared with less salt or no salt, if possible. If you drink alcohol: Limit how much you use to: 0-1 drink a day for women who are not pregnant. 0-2 drinks a day for men. Be aware of how much alcohol is in your drink. In the [place] one drink equals one 12 oz bottle of beer (355 mL), one 5 oz glass of wine (148 mL), or one 1 oz glass of hard liquor (44 mL). General information Avoid eating more than 2,300 mg of salt a day. If you have hypertension, you may need to reduce your sodium intake to 1,500 mg a day. Work with your health care provider to maintain a healthy body weight or to lose weight. Ask what an ideal weight is for you. Get at least 30 minutes of   exercise that causes your heart to beat faster (aerobic exercise) most days of the week. Activities may include walking, swimming, or biking. Work with your health care provider or dietitian to adjust your eating plan to your individual calorie needs. What foods should I eat? Fruits All fresh, dried, or frozen fruit. Canned fruit in natural juice (without added sugar). Vegetables Fresh or frozen vegetables (raw, steamed, roasted, or grilled). Low-sodium or reduced-sodium tomato and vegetable juice.  Low-sodium or reduced-sodium tomato sauce and tomato paste. Low-sodium or reduced-sodium canned vegetables. Grains Whole-grain or whole-wheat bread. Whole-grain or whole-wheat pasta. Brown rice. Oatmeal. Quinoa. Bulgur. Whole-grain and low-sodium cereals. Pita bread. Low-fat, low-sodium crackers. Whole-wheat flour tortillas. Meats and other proteins Skinless chicken or turkey. Ground chicken or turkey. Pork with fat trimmed off. Fish and seafood. Egg whites. Dried beans, peas, or lentils. Unsalted nuts, nut butters, and seeds. Unsalted canned beans. Lean cuts of beef with fat trimmed off. Low-sodium, lean precooked or cured meat, such as sausages or meat loaves. Dairy Low-fat (1%) or fat-free (skim) milk. Reduced-fat, low-fat, or fat-free cheeses. Nonfat, low-sodium ricotta or cottage cheese. Low-fat or nonfat yogurt. Low-fat, low-sodium cheese. Fats and oils Soft margarine without trans fats. Vegetable oil. Reduced-fat, low-fat, or light mayonnaise and salad dressings (reduced-sodium). Canola, safflower, olive, avocado, soybean, and sunflower oils. Avocado. Seasonings and condiments Herbs. Spices. Seasoning mixes without salt. Other foods Unsalted popcorn and pretzels. Fat-free sweets. The items listed above may not be a complete list of foods and beverages you can eat. Contact a dietitian for more information. What foods should I avoid? Fruits Canned fruit in a light or heavy syrup. Fried fruit. Fruit in cream or butter sauce. Vegetables Creamed or fried vegetables. Vegetables in a cheese sauce. Regular canned vegetables (not low-sodium or reduced-sodium). Regular canned tomato sauce and paste (not low-sodium or reduced-sodium). Regular tomato and vegetable juice (not low-sodium or reduced-sodium). Pickles. Olives. Grains Baked goods made with fat, such as croissants, muffins, or some breads. Dry pasta or rice meal packs. Meats and other proteins Fatty cuts of meat. Ribs. Fried meat. Bacon.  Bologna, salami, and other precooked or cured meats, such as sausages or meat loaves. Fat from the back of a pig (fatback). Bratwurst. Salted nuts and seeds. Canned beans with added salt. Canned or smoked fish. Whole eggs or egg yolks. Chicken or turkey with skin. Dairy Whole or 2% milk, cream, and half-and-half. Whole or full-fat cream cheese. Whole-fat or sweetened yogurt. Full-fat cheese. Nondairy creamers. Whipped toppings. Processed cheese and cheese spreads. Fats and oils Butter. Stick margarine. Lard. Shortening. Ghee. Bacon fat. Tropical oils, such as coconut, palm kernel, or palm oil. Seasonings and condiments Onion salt, garlic salt, seasoned salt, table salt, and sea salt. Worcestershire sauce. Tartar sauce. Barbecue sauce. Teriyaki sauce. Soy sauce, including reduced-sodium. Steak sauce. Canned and packaged gravies. Fish sauce. Oyster sauce. Cocktail sauce. Store-bought horseradish. Ketchup. Mustard. Meat flavorings and tenderizers. Bouillon cubes. Hot sauces. Pre-made or packaged marinades. Pre-made or packaged taco seasonings. Relishes. Regular salad dressings. Other foods Salted popcorn and pretzels. The items listed above may not be a complete list of foods and beverages you should avoid. Contact a dietitian for more information. Where to find more information National Heart, Lung, and Blood Institute: www.nhlbi.nih.gov American Heart Association: www.heart.org Academy of Nutrition and Dietetics: www.eatright.org National Kidney Foundation: www.kidney.org Summary The DASH eating plan is a healthy eating plan that has been shown to reduce high blood pressure (hypertension). It may also reduce your risk for type 2   diabetes, heart disease, and stroke. When on the DASH eating plan, aim to eat more fresh fruits and vegetables, whole grains, lean proteins, low-fat dairy, and heart-healthy fats. With the DASH eating plan, you should limit salt (sodium) intake to 2,300 mg a day. If you have  hypertension, you may need to reduce your sodium intake to 1,500 mg a day. Work with your health care provider or dietitian to adjust your eating plan to your individual calorie needs. This information is not intended to replace advice given to you by your health care provider. Make sure you discuss any questions you have with your health care provider. Document Revised: 03/11/2019 Document Reviewed: 03/11/2019 Elsevier Patient Education  2023 [employer]vier Inc.  

## 2021-11-05 ENCOUNTER — Ambulatory Visit: Payer: Medicare PPO | Admitting: Nurse Practitioner

## 2021-11-05 ENCOUNTER — Encounter: Payer: Self-pay | Admitting: Nurse Practitioner

## 2021-11-05 VITALS — BP 131/77 | HR 72 | Temp 98.2°F | Wt 119.2 lb

## 2021-11-05 DIAGNOSIS — M810 Age-related osteoporosis without current pathological fracture: Secondary | ICD-10-CM | POA: Diagnosis not present

## 2021-11-05 DIAGNOSIS — E559 Vitamin D deficiency, unspecified: Secondary | ICD-10-CM | POA: Diagnosis not present

## 2021-11-05 DIAGNOSIS — M9080 Osteopathy in diseases classified elsewhere, unspecified site: Secondary | ICD-10-CM | POA: Diagnosis not present

## 2021-11-05 DIAGNOSIS — I10 Essential (primary) hypertension: Secondary | ICD-10-CM | POA: Diagnosis not present

## 2021-11-05 DIAGNOSIS — E538 Deficiency of other specified B group vitamins: Secondary | ICD-10-CM | POA: Diagnosis not present

## 2021-11-05 DIAGNOSIS — D508 Other iron deficiency anemias: Secondary | ICD-10-CM

## 2021-11-05 DIAGNOSIS — E782 Mixed hyperlipidemia: Secondary | ICD-10-CM | POA: Diagnosis not present

## 2021-11-05 DIAGNOSIS — N1831 Chronic kidney disease, stage 3a: Secondary | ICD-10-CM | POA: Diagnosis not present

## 2021-11-05 DIAGNOSIS — F4321 Adjustment disorder with depressed mood: Secondary | ICD-10-CM

## 2021-11-05 LAB — MICROALBUMIN, URINE WAIVED
Creatinine, Urine Waived: 100 mg/dL (ref 10–300)
Microalb, Ur Waived: 80 mg/L — ABNORMAL HIGH (ref 0–19)

## 2021-11-05 MED ORDER — BUSPIRONE HCL 5 MG PO TABS: 5.0000 mg | ORAL_TABLET | Freq: Three times a day (TID) | ORAL | 1 refills | Status: DC | PRN

## 2021-11-05 NOTE — Progress Notes (Signed)
BP 131/77   Pulse 72   Temp 98.2 F (36.8 C) (Oral)   Wt 119 lb 3.2 oz (54.1 kg)   SpO2 95%   BMI 28.63 kg/m    Subjective:    Patient ID: Felicia Robinson, female    DOB: 16-Jan-1946, 76 y.o.   MRN: [id number]  HPI: Felicia Robinson is a 76 y.o. female  Chief Complaint  Patient presents with   Anemia   Hyperlipidemia   Hypertension   Rickets   NOTE WRITTEN BY FNP STUDENT.  ASSESSMENT AND PLAN OF CARE REVIEWED WITH STUDENT, AGREE WITH ABOVE FINDINGS AND PLAN.   HYPERTENSION / HYPERLIPIDEMIA Continues on Olmesartan, Nebivolol, and Rosuvastatin. Satisfied with current treatment? yes Duration of hypertension: years BP monitoring frequency: rarely BP range: 120-125 BP medication side effects: no Duration of hyperlipidemia: years Cholesterol medication side effects: no Cholesterol supplements: none Medication compliance: excellent compliance Aspirin: yes Recent stressors: yes Recurrent headaches: no Visual changes: no Palpitations: no Dyspnea: no Chest pain: no Lower extremity edema: occasionally if standing for a long time Dizzy/lightheaded: no   CHRONIC KIDNEY DISEASE Has Rickets which is followed by endo at [facility] -- taking Vitamin D supplement.  Last visit was 01/02/21. Labs have shown CKD 3a.   CKD status: controlled Medications renally dose: no Previous renal evaluation: yes Pneumovax:  Up to Date Influenza Vaccine:  Up to Date   ANEMIA Continues on Ferrous Sulfate and B12. Anemia status: controlled Etiology of anemia: Duration of anemia treatment:  Compliance with treatment: good compliance Iron supplementation side effects: no Severity of anemia: mild Fatigue: no Decreased exercise tolerance: no  Dyspnea on exertion: no Palpitations: no Bleeding: no Pica: no   GRIEF REACTION Continues on Sertraline. No longer needing Buspar.  Mood status: stable Satisfied with current treatment?: yes Symptom severity: mild  Duration of current treatment :  years Side effects: no Medication compliance: good compliance Psychotherapy/counseling: no  Depressed mood: yes Anxious mood: no Anhedonia: no Significant weight loss or gain: no Insomnia: no  Fatigue: yes Feelings of worthlessness or guilt: yes Impaired concentration/indecisiveness: no Suicidal ideations: no Hopelessness: no Crying spells: yes    11/05/2021    1:09 PM 05/14/2021   10:39 AM 05/06/2021    1:44 PM 03/25/2021    2:14 PM 02/04/2021    2:25 PM  Depression screen PHQ 2/9  Decreased Interest 1 1 1 1 1   Down, Depressed, Hopeless 1 1 0 1 1  PHQ - 2 Score 2 2 1 2 2   Altered sleeping 0 0 0 1 1  Tired, decreased energy 1 0 1 1 1   Change in appetite 0 0 0 0 0  Feeling bad or failure about yourself  1 0 1 1 0  Trouble concentrating 0 0 1 0 1  Moving slowly or fidgety/restless 0 0 0 0 0  Suicidal thoughts 0 0 0 0 0  PHQ-9 Score 4 2 4 5 5   Difficult doing work/chores Not difficult at all   Not difficult at all Not difficult at all       11/05/2021    1:54 PM 11/05/2021    1:10 PM 05/06/2021    1:45 PM 03/25/2021    2:14 PM  GAD 7 : Generalized Anxiety Score  Nervous, Anxious, on Edge 1 1 1  0  Control/stop worrying 1 1 0 0  Worry too much - different things 1 1 1 1   Trouble relaxing 0  1 0  Restless 0 0 1 0  Easily annoyed or irritable 0 0 0 0  Afraid - awful might happen 0  1 0  Total GAD 7 Score $Remov'3  5 1  'gyulyD$ Anxiety Difficulty Not difficult at all Not difficult at all Somewhat difficult Not difficult at all      Relevant past medical, surgical, family and social history reviewed and updated as indicated. Interim medical history since our last visit reviewed. Allergies and medications reviewed and updated.  Review of Systems  Constitutional:  Negative for chills, fatigue and fever.  Respiratory:  Negative for cough, chest tightness and shortness of breath.   Gastrointestinal:  Negative for diarrhea, nausea and vomiting.  Endocrine: Negative for cold intolerance, heat  intolerance, polydipsia, polyphagia and polyuria.  Genitourinary: Negative.   Musculoskeletal:  Negative for arthralgias, joint swelling and myalgias.  Skin:  Negative for rash.  Neurological: Negative.   Psychiatric/Behavioral:  Negative for confusion and suicidal ideas. The patient is nervous/anxious.     Per HPI unless specifically indicated above     Objective:    BP 131/77   Pulse 72   Temp 98.2 F (36.8 C) (Oral)   Wt 119 lb 3.2 oz (54.1 kg)   SpO2 95%   BMI 28.63 kg/m   Wt Readings from Last 3 Encounters:  11/05/21 119 lb 3.2 oz (54.1 kg)  05/06/21 115 lb 3.2 oz (52.3 kg)  03/25/21 115 lb (52.2 kg)    Physical Exam Vitals and nursing note reviewed.  Constitutional:      General: She is awake. She is not in acute distress.    Appearance: She is well-developed and well-groomed. She is not ill-appearing or toxic-appearing.  HENT:     Head: Normocephalic.     Right Ear: Hearing normal.     Left Ear: Hearing normal.  Eyes:     General: Lids are normal.        Right eye: No discharge.        Left eye: No discharge.     Conjunctiva/sclera: Conjunctivae normal.     Pupils: Pupils are equal, round, and reactive to light.  Neck:     Thyroid: No thyromegaly or thyroid tenderness.     Vascular: No carotid bruit.  Cardiovascular:     Rate and Rhythm: Normal rate and regular rhythm.     Pulses: Normal pulses.     Heart sounds: Normal heart sounds. No murmur heard.    No gallop.  Pulmonary:     Effort: Pulmonary effort is normal. No accessory muscle usage or respiratory distress.     Breath sounds: Normal breath sounds.  Abdominal:     General: Bowel sounds are normal.     Palpations: Abdomen is soft. There is no hepatomegaly or splenomegaly.     Tenderness: There is no abdominal tenderness.  Musculoskeletal:     Cervical back: Normal range of motion and neck supple.     Right lower leg: No edema.     Left lower leg: No edema.  Lymphadenopathy:     Cervical: No  cervical adenopathy.  Skin:    General: Skin is warm.     Capillary Refill: Capillary refill takes less than 2 seconds.  Neurological:     Mental Status: She is alert and oriented to person, place, and time.     Deep Tendon Reflexes: Reflexes are normal and symmetric.  Psychiatric:        Attention and Perception: Attention normal.        Mood and Affect: Mood  normal.        Speech: Speech normal.        Behavior: Behavior normal. Behavior is cooperative.        Thought Content: Thought content normal.     Results for orders placed or performed in visit on 05/06/21  CBC with Differential/Platelet  Result Value Ref Range   WBC 9.3 3.4 - 10.8 x10E3/uL   RBC 4.86 3.77 - 5.28 x10E6/uL   Hemoglobin 12.1 11.1 - 15.9 g/dL   Hematocrit 39.0 34.0 - 46.6 %   MCV 80 79 - 97 fL   MCH 24.9 (L) 26.6 - 33.0 pg   MCHC 31.0 (L) 31.5 - 35.7 g/dL   Platelets 240 150 - 450 x10E3/uL   Neutrophils 63 Not Estab. %   Lymphs 25 Not Estab. %   Monocytes 6 Not Estab. %   Eos 5 Not Estab. %   Basos 1 Not Estab. %   Neutrophils Absolute 5.7 1.4 - 7.0 x10E3/uL   Lymphocytes Absolute 2.4 0.7 - 3.1 x10E3/uL   Monocytes Absolute 0.6 0.1 - 0.9 x10E3/uL   EOS (ABSOLUTE) 0.5 (H) 0.0 - 0.4 x10E3/uL   Basophils Absolute 0.1 0.0 - 0.2 x10E3/uL   Immature Granulocytes 0 Not Estab. %   Immature Grans (Abs) 0.0 0.0 - 0.1 x10E3/uL   Hematology Comments: Note:   Basic metabolic panel  Result Value Ref Range   Glucose 87 70 - 99 mg/dL   BUN 19 8 - 27 mg/dL   Creatinine, Ser 0.81 0.57 - 1.00 mg/dL   eGFR 76 >59 mL/min/1.73   BUN/Creatinine Ratio 23 12 - 28   Sodium 136 134 - 144 mmol/L   Potassium 5.0 3.5 - 5.2 mmol/L   Chloride 100 96 - 106 mmol/L   CO2 21 20 - 29 mmol/L   Calcium 9.0 8.7 - 10.3 mg/dL  Iron Binding Cap (TIBC)([facility])  Result Value Ref Range   Total Iron Binding Capacity 524 (H) 250 - 450 ug/dL   UIBC 492 (H) 118 - 369 ug/dL   Iron 32 27 - 139 ug/dL   Iron Saturation 6 (LL) 15 -  55 %  Ferritin  Result Value Ref Range   Ferritin 26 15 - 150 ng/mL      Assessment & Plan:   Problem List Items Addressed This Visit       Cardiovascular and Mediastinum   Essential hypertension    Chronic, stable. BP at goal in office today and at home. Encourage her to check her BP a few mornings each week at home and document for provider. Continue current medication regimen and adjust as needed. Labs today: CBC, CMP, TSH      Relevant Orders   Comprehensive metabolic panel   TSH     Musculoskeletal and Integument   Osteoporosis    Noted on prior DEXA scan. Order placed for new DEXA scan.       Relevant Orders   VITAMIN D 25 Hydroxy (Vit-D Deficiency, Fractures)   DG Bone Density   X-linked hypophosphatemic rickets    Followed by endocrinology at [facility] and receives monthly injections. Continue Vit D supplement as recommended by them.       Relevant Orders   VITAMIN D 25 Hydroxy (Vit-D Deficiency, Fractures)   DG Bone Density     Genitourinary   CKD (chronic kidney disease) stage 3, GFR 30-59 ml/min ([place]) - Primary    Noted on recent labs. Recheck CMP today. Refer to nephrology in the future if  needed.       Relevant Orders   CBC with Differential/Platelet   Comprehensive metabolic panel   Microalbumin, Urine Waived     Other   Anemia    Noted on recent labs, taking iron supplement daily. Recheck CBC and iron today.       Relevant Orders   CBC with Differential/Platelet   Iron Binding Cap (TIBC)([facility])   Ferritin   B12 deficiency    Followed by [facility] endocrinology, recent lab stable. Recheck level today and continue supplement.       Relevant Orders   CBC with Differential/Platelet   Vitamin B12   Grief reaction    Chronic, ongoing. Continues on Zoloft which is offering benefit. Denies SI/HI. She has Buspar which she has not taken in a year. With increased stressors, recommend she keep this and take it as needed. PHQ9 = 4, GAD7 = 3       Relevant Medications   busPIRone (BUSPAR) 5 MG tablet   Mixed hyperlipidemia    Chronic. Check lipid panel today. Continue current medication regimen and adjust as needed.      Relevant Orders   Comprehensive metabolic panel   Lipid Panel w/o Chol/HDL Ratio   Vitamin D deficiency    With underlying rickets, followed by [facility] endocrinology. Continue supplement. Check level today.       Relevant Orders   VITAMIN D 25 Hydroxy (Vit-D Deficiency, Fractures)     Follow up plan: Return in about 6 months (around 05/08/2022) for ANEMIA, HTN/HLD, RICKETS, OSTEOPOROSIS, CKD.

## 2021-11-05 NOTE — Assessment & Plan Note (Signed)
Noted on prior DEXA scan. Order placed for new DEXA scan.

## 2021-11-05 NOTE — Assessment & Plan Note (Signed)
Noted on recent labs, taking iron supplement daily. Recheck CBC and iron today.

## 2021-11-05 NOTE — Progress Notes (Deleted)
BP 131/77   Pulse (!) 56   Temp 98.2 F (36.8 C) (Oral)   Wt 119 lb 3.2 oz (54.1 kg)   SpO2 95%   BMI 28.63 kg/m    Subjective:    Patient ID: Felicia Robinson, female    DOB: [birth year], 76 y.o.   MRN: [id number]  HPI: Felicia Robinson is a 76 y.o. female  Chief Complaint  Patient presents with   Anemia   Hyperlipidemia   Hypertension   Rickets   HYPERTENSION / HYPERLIPIDEMIA Continues on Olmesartan, Nebivolol, and Rosuvastatin. Satisfied with current treatment? {Blank single:19197::"yes","no"} Duration of hypertension: {Blank single:19197::"chronic","months","years"} BP monitoring frequency: {Blank single:19197::"not checking","rarely","daily","weekly","monthly","a few times a day","a few times a week","a few times a month"} BP range:  BP medication side effects: {Blank single:19197::"yes","no"} Duration of hyperlipidemia: {Blank single:19197::"chronic","months","years"} Cholesterol medication side effects: {Blank single:19197::"yes","no"} Cholesterol supplements: {Blank multiple:19196::"none","fish oil","niacin","red yeast rice"} Medication compliance: {Blank single:19197::"excellent compliance","good compliance","fair compliance","poor compliance"} Aspirin: {Blank single:19197::"yes","no"} Recent stressors: {Blank single:19197::"yes","no"} Recurrent headaches: {Blank single:19197::"yes","no"} Visual changes: {Blank single:19197::"yes","no"} Palpitations: {Blank single:19197::"yes","no"} Dyspnea: {Blank single:19197::"yes","no"} Chest pain: {Blank single:19197::"yes","no"} Lower extremity edema: {Blank single:19197::"yes","no"} Dizzy/lightheaded: {Blank single:19197::"yes","no"}   CHRONIC KIDNEY DISEASE Has Rickets which is followed by endo at [facility] -- taking Vitamin D supplement.  Last visit was 01/02/21. Labs have shown CKD 3a.   CKD status: {Blank single:19197::"controlled","uncontrolled","better","worse","exacerbated","stable"} Medications renally dose:  {Blank single:19197::"yes","no"} Previous renal evaluation: {Blank single:19197::"yes","no"} Pneumovax:  {Blank single:19197::"Up to Date","Not up to Date","unknown"} Influenza Vaccine:  {Blank single:19197::"Up to Date","Not up to Date","unknown"}   ANEMIA Continues on Ferrous Sulfate and B12. Anemia status: {Blank single:19197::"controlled","uncontrolled","better","worse","exacerbated","stable"} Etiology of anemia: Duration of anemia treatment:  Compliance with treatment: {Blank single:19197::"excellent compliance","good compliance","fair compliance","poor compliance"} Iron supplementation side effects: {Blank single:19197::"yes","no"} Severity of anemia: {Blank single:19197::"mild","moderate","severe"} Fatigue: {Blank single:19197::"yes","no"} Decreased exercise tolerance: {Blank single:19197::"yes","no"}  Dyspnea on exertion: {Blank single:19197::"yes","no"} Palpitations: {Blank single:19197::"yes","no"} Bleeding: {Blank single:19197::"yes","no"} Pica: {Blank single:19197::"yes","no"}   GRIEF REACTION Continues on Sertraline and Buspar. Mood status: {Blank single:19197::"controlled","uncontrolled","better","worse","exacerbated","stable"} Satisfied with current treatment?: {Blank single:19197::"yes","no"} Symptom severity: {Blank single:19197::"mild","moderate","severe"}  Duration of current treatment : {Blank single:19197::"chronic","months","years"} Side effects: {Blank single:19197::"yes","no"} Medication compliance: {Blank single:19197::"excellent compliance","good compliance","fair compliance","poor compliance"} Psychotherapy/counseling: {Blank single:19197::"yes","no"} {Blank single:19197::"current","in the past"} Depressed mood: {Blank single:19197::"yes","no"} Anxious mood: {Blank single:19197::"yes","no"} Anhedonia: {Blank single:19197::"yes","no"} Significant weight loss or gain: {Blank single:19197::"yes","no"} Insomnia: {Blank single:19197::"yes","no"} {Blank  single:19197::"hard to fall asleep","hard to stay asleep"} Fatigue: {Blank single:19197::"yes","no"} Feelings of worthlessness or guilt: {Blank single:19197::"yes","no"} Impaired concentration/indecisiveness: {Blank single:19197::"yes","no"} Suicidal ideations: {Blank single:19197::"yes","no"} Hopelessness: {Blank single:19197::"yes","no"} Crying spells: {Blank single:19197::"yes","no"}    11/05/2021    1:09 PM 05/14/2021   10:39 AM 05/06/2021    1:44 PM 03/25/2021    2:14 PM 02/04/2021    2:25 PM  Depression screen PHQ 2/9  Decreased Interest _0 Down, Depressed, Hopeless 1 1 0 1 1  PHQ - 2 Score _1 Altered sleeping 0 0 0 1 1  Tired, decreased energy 1 0 _2 Change in appetite 0 0 0 0 0  Feeling bad or failure about yourself  1 0 1 1 0  Trouble concentrating 0 0 1 0 1  Moving slowly or fidgety/restless 0 0 0 0 0  Suicidal thoughts 0 0 0 0 0  PHQ-9 Score _3 Difficult doing work/chores Not difficult at all   Not difficult at all Not difficult at all       11/05/2021    1:10 PM 05/06/2021  1:45 PM 03/25/2021    2:14 PM 02/04/2021    2:59 PM  GAD 7 : Generalized Anxiety Score  Nervous, Anxious, on Edge 1 1 0 1  Control/stop worrying 1 0 0 0  Worry too much - different things _0 Trouble relaxing  1 0 0  Restless 0 1 0 0  Easily annoyed or irritable 0 0 0 1  Afraid - awful might happen  1 0 0  Total GAD 7 Score  _1 Anxiety Difficulty Not difficult at all Somewhat difficult Not difficult at all Somewhat difficult      Relevant past medical, surgical, family and social history reviewed and updated as indicated. Interim medical history since our last visit reviewed. Allergies and medications reviewed and updated.  Review of Systems  Per HPI unless specifically indicated above     Objective:    BP 131/77   Pulse (!) 56   Temp 98.2 F (36.8 C) (Oral)   Wt 119 lb 3.2 oz (54.1 kg)   SpO2 95%   BMI 28.63 kg/m   Wt Readings from Last 3  Encounters:  11/05/21 119 lb 3.2 oz (54.1 kg)  05/06/21 115 lb 3.2 oz (52.3 kg)  03/25/21 115 lb (52.2 kg)    Physical Exam  Results for orders placed or performed in visit on 05/06/21  CBC with Differential/Platelet  Result Value Ref Range   WBC 9.3 3.4 - 10.8 x10E3/uL   RBC 4.86 3.77 - 5.28 x10E6/uL   Hemoglobin 12.1 11.1 - 15.9 g/dL   Hematocrit 39.0 34.0 - 46.6 %   MCV 80 79 - 97 fL   MCH 24.9 (L) 26.6 - 33.0 pg   MCHC 31.0 (L) 31.5 - 35.7 g/dL   Platelets 240 150 - 450 x10E3/uL   Neutrophils 63 Not Estab. %   Lymphs 25 Not Estab. %   Monocytes 6 Not Estab. %   Eos 5 Not Estab. %   Basos 1 Not Estab. %   Neutrophils Absolute 5.7 1.4 - 7.0 x10E3/uL   Lymphocytes Absolute 2.4 0.7 - 3.1 x10E3/uL   Monocytes Absolute 0.6 0.1 - 0.9 x10E3/uL   EOS (ABSOLUTE) 0.5 (H) 0.0 - 0.4 x10E3/uL   Basophils Absolute 0.1 0.0 - 0.2 x10E3/uL   Immature Granulocytes 0 Not Estab. %   Immature Grans (Abs) 0.0 0.0 - 0.1 x10E3/uL   Hematology Comments: Note:   Basic metabolic panel  Result Value Ref Range   Glucose 87 70 - 99 mg/dL   BUN 19 8 - 27 mg/dL   Creatinine, Ser 0.81 0.57 - 1.00 mg/dL   eGFR 76 >59 mL/min/1.73   BUN/Creatinine Ratio 23 12 - 28   Sodium 136 134 - 144 mmol/L   Potassium 5.0 3.5 - 5.2 mmol/L   Chloride 100 96 - 106 mmol/L   CO2 21 20 - 29 mmol/L   Calcium 9.0 8.7 - 10.3 mg/dL  Iron Binding Cap (TIBC)([facility])  Result Value Ref Range   Total Iron Binding Capacity 524 (H) 250 - 450 ug/dL   UIBC 492 (H) 118 - 369 ug/dL   Iron 32 27 - 139 ug/dL   Iron Saturation 6 (LL) 15 - 55 %  Ferritin  Result Value Ref Range   Ferritin 26 15 - 150 ng/mL      Assessment & Plan:   Problem List Items Addressed This Visit       Cardiovascular and Mediastinum   Essential hypertension   Relevant Orders  Comprehensive metabolic panel   TSH     Musculoskeletal and Integument   Osteoporosis   Relevant Orders   VITAMIN D 25 Hydroxy (Vit-D Deficiency, Fractures)    X-linked hypophosphatemic rickets   Relevant Orders   VITAMIN D 25 Hydroxy (Vit-D Deficiency, Fractures)     Genitourinary   CKD (chronic kidney disease) stage 3, GFR 30-59 ml/min ([facility]) - Primary   Relevant Orders   CBC with Differential/Platelet   Comprehensive metabolic panel   Microalbumin, Urine Waived     Other   Anemia   Relevant Orders   CBC with Differential/Platelet   Iron Binding Cap (TIBC)([facility])   Ferritin   B12 deficiency   Relevant Orders   CBC with Differential/Platelet   Vitamin B12   Grief reaction   Mixed hyperlipidemia   Relevant Orders   Comprehensive metabolic panel   Lipid Panel w/o Chol/HDL Ratio   Vitamin D deficiency   Relevant Orders   VITAMIN D 25 Hydroxy (Vit-D Deficiency, Fractures)   Other Visit Diagnoses     Encounter for annual physical exam        Annual physical today with labs and health maintenance reviewed, discussed with patient.        Follow up plan: No follow-ups on file.

## 2021-11-05 NOTE — Assessment & Plan Note (Signed)
Noted on recent labs. Recheck CMP today. Refer to nephrology in the future if needed.

## 2021-11-05 NOTE — Progress Notes (Signed)
Contacted via MyChart   Good evening Felicia Robinson, your urine has returned and does show some protein passing in urine.  We will continue to check this every year, but for now continue your Olmesartan daily which offers kidney protection with proteinuria.  Any questions?

## 2021-11-05 NOTE — Assessment & Plan Note (Signed)
Followed by endocrinology at [facility] and receives monthly injections. Continue Vit D supplement as recommended by them.

## 2021-11-05 NOTE — Assessment & Plan Note (Addendum)
Chronic, ongoing. Continues on Zoloft which is offering benefit. Denies SI/HI. She has Buspar which she has not taken in a year. With increased stressors, recommend she keep this and take it as needed. PHQ9 = 4, GAD7 = 3

## 2021-11-05 NOTE — Assessment & Plan Note (Signed)
With underlying rickets, followed by [facility] endocrinology. Continue supplement. Check level today.

## 2021-11-05 NOTE — Assessment & Plan Note (Signed)
Followed by [facility] endocrinology, recent lab stable. Recheck level today and continue supplement.

## 2021-11-05 NOTE — Assessment & Plan Note (Signed)
Chronic, stable. BP at goal in office today and at home. Encourage her to check her BP a few mornings each week at home and document for provider. Continue current medication regimen and adjust as needed. Labs today: CBC, CMP, TSH

## 2021-11-05 NOTE — Assessment & Plan Note (Signed)
Chronic. Check lipid panel today. Continue current medication regimen and adjust as needed.

## 2021-11-06 LAB — CBC WITH DIFFERENTIAL/PLATELET
Basophils Absolute: 0.1 10*3/uL (ref 0.0–0.2)
Basos: 1 %
EOS (ABSOLUTE): 0.3 10*3/uL (ref 0.0–0.4)
Eos: 4 %
Hematocrit: 44.9 % (ref 34.0–46.6)
Hemoglobin: 15 g/dL (ref 11.1–15.9)
Immature Grans (Abs): 0 10*3/uL (ref 0.0–0.1)
Immature Granulocytes: 0 %
Lymphocytes Absolute: 2.1 10*3/uL (ref 0.7–3.1)
Lymphs: 26 %
MCH: 30.8 pg (ref 26.6–33.0)
MCHC: 33.4 g/dL (ref 31.5–35.7)
MCV: 92 fL (ref 79–97)
Monocytes Absolute: 0.6 10*3/uL (ref 0.1–0.9)
Monocytes: 7 %
Neutrophils Absolute: 5 10*3/uL (ref 1.4–7.0)
Neutrophils: 62 %
Platelets: 229 10*3/uL (ref 150–450)
RBC: 4.87 x10E6/uL (ref 3.77–5.28)
RDW: 13.1 % (ref 11.7–15.4)
WBC: 8 10*3/uL (ref 3.4–10.8)

## 2021-11-06 LAB — COMPREHENSIVE METABOLIC PANEL
ALT: 17 IU/L (ref 0–32)
AST: 22 IU/L (ref 0–40)
Albumin/Globulin Ratio: 1.7 (ref 1.2–2.2)
Albumin: 4.7 g/dL (ref 3.8–4.8)
Alkaline Phosphatase: 186 IU/L — ABNORMAL HIGH (ref 44–121)
BUN/Creatinine Ratio: 21 (ref 12–28)
BUN: 22 mg/dL (ref 8–27)
Bilirubin Total: 0.4 mg/dL (ref 0.0–1.2)
CO2: 19 mmol/L — ABNORMAL LOW (ref 20–29)
Calcium: 9.7 mg/dL (ref 8.7–10.3)
Chloride: 100 mmol/L (ref 96–106)
Creatinine, Ser: 1.04 mg/dL — ABNORMAL HIGH (ref 0.57–1.00)
Globulin, Total: 2.8 g/dL (ref 1.5–4.5)
Glucose: 83 mg/dL (ref 70–99)
Potassium: 4.8 mmol/L (ref 3.5–5.2)
Sodium: 137 mmol/L (ref 134–144)
Total Protein: 7.5 g/dL (ref 6.0–8.5)
eGFR: 56 mL/min/{1.73_m2} — ABNORMAL LOW (ref >59)

## 2021-11-06 LAB — LIPID PANEL W/O CHOL/HDL RATIO
Cholesterol, Total: 173 mg/dL (ref 100–199)
HDL: 53 mg/dL (ref >39)
LDL Chol Calc (NIH): 57 mg/dL (ref 0–99)
Triglycerides: 419 mg/dL — ABNORMAL HIGH (ref 0–149)
VLDL Cholesterol Cal: 63 mg/dL — ABNORMAL HIGH (ref 5–40)

## 2021-11-06 LAB — FERRITIN: Ferritin: 35 ng/mL (ref 15–150)

## 2021-11-06 LAB — VITAMIN B12: Vitamin B-12: 1249 pg/mL — ABNORMAL HIGH (ref 232–1245)

## 2021-11-06 LAB — IRON AND TIBC
Iron Saturation: 31 % (ref 15–55)
Iron: 144 ug/dL — ABNORMAL HIGH (ref 27–139)
Total Iron Binding Capacity: 467 ug/dL — ABNORMAL HIGH (ref 250–450)
UIBC: 323 ug/dL (ref 118–369)

## 2021-11-06 LAB — VITAMIN D 25 HYDROXY (VIT D DEFICIENCY, FRACTURES): Vit D, 25-Hydroxy: 26.4 ng/mL — ABNORMAL LOW (ref 30.0–100.0)

## 2021-11-06 LAB — TSH: TSH: 1.49 u[IU]/mL (ref 0.450–4.500)

## 2021-11-06 NOTE — Progress Notes (Signed)
Contacted via [place] morning Angelic, your labs have returned: - Kidney function has trended down a little again, this fluctuates on your labs.  At this time we will continue to monitor.  I do recommend to ensure good hydration daily and add a little lemon to water.  Liver function, AST and ALT, is normal.  Alkaline phosphatase is a little elevated, but trended down from previous -- this can be related to your bone health. - Cholesterol labs show at goal LDL, triglycerides are a bit elevated, continue current Rosuvastatin dosing:) - B12 a little above normal, I would recommend reducing supplement to every other day.  Iron level also above normal, you can reduce iron supplement to 3 days a week and we will recheck at next visit. - Vitamin D remains on low side, continue supplement.  Remainder of labs are normal.  Any questions? Keep being amazing!!  Thank you for allowing me to participate in your care.  I appreciate you. Kindest regards, Glennon Kopko

## 2022-01-29 DIAGNOSIS — M84351G Stress fracture, right femur, subsequent encounter for fracture with delayed healing: Secondary | ICD-10-CM | POA: Diagnosis not present

## 2022-01-29 DIAGNOSIS — M175 Other unilateral secondary osteoarthritis of knee: Secondary | ICD-10-CM | POA: Diagnosis not present

## 2022-05-10 ENCOUNTER — Other Ambulatory Visit: Payer: Self-pay | Admitting: Nurse Practitioner

## 2022-05-11 NOTE — Patient Instructions (Signed)
Please call to schedule your mammogram and/or bone density: [facility] at [place]: [address], [place], [place] [address] Phone: [phone]  [place] at [facility] [address] [place],  [place]  [address] Phone: [phone]     Heart-Healthy Eating Plan Many factors influence your heart health, including eating and exercise habits. Heart health is also called coronary health. Coronary risk increases with abnormal blood fat (lipid) levels. A heart-healthy eating plan includes limiting unhealthy fats, increasing healthy fats, limiting salt (sodium) intake, and making other diet and lifestyle changes. What is my plan? Your health care provider may recommend that: You limit your fat intake to _________% or less of your total calories each day. You limit your saturated fat intake to _________% or less of your total calories each day. You limit the amount of cholesterol in your diet to less than _________ mg per day. You limit the amount of sodium in your diet to less than _________ mg per day. What are tips for following this plan? Cooking Cook foods using methods other than frying. Baking, boiling, grilling, and broiling are all good options. Other ways to reduce fat include: Removing the skin from poultry. Removing all visible fats from meats. Steaming vegetables in water or broth. Meal planning  At meals, imagine dividing your plate into fourths: Fill one-half of your plate with vegetables and green salads. Fill one-fourth of your plate with whole grains. Fill one-fourth of your plate with lean protein foods. Eat 2-4 cups of vegetables per day. One cup of vegetables equals 1 cup (91 g) broccoli or cauliflower florets, 2 medium carrots, 1 large bell pepper, 1 large sweet potato, 1 large tomato, 1 medium white potato, 2 cups (150 g) raw leafy greens. Eat 1-2 cups of fruit per day. One cup of fruit equals 1  small apple, 1 large banana, 1 cup (237 g) mixed fruit, 1 large orange,  cup (82 g) dried fruit, 1 cup (240 mL) 100% fruit juice. Eat more foods that contain soluble fiber. Examples include apples, broccoli, carrots, beans, peas, and barley. Aim to get 25-30 g of fiber per day. Increase your consumption of legumes, nuts, and seeds to 4-5 servings per week. One serving of dried beans or legumes equals  cup (90 g) cooked, 1 serving of nuts is  oz (12 almonds, 24 pistachios, or 7 walnut halves), and 1 serving of seeds equals  oz (8 g). Fats Choose healthy fats more often. Choose monounsaturated and polyunsaturated fats, such as olive and canola oils, avocado oil, flaxseeds, walnuts, almonds, and seeds. Eat more omega-3 fats. Choose salmon, mackerel, sardines, tuna, flaxseed oil, and ground flaxseeds. Aim to eat fish at least 2 times each week. Check food labels carefully to identify foods with trans fats or high amounts of saturated fat. Limit saturated fats. These are found in animal products, such as meats, butter, and cream. Plant sources of saturated fats include palm oil, palm kernel oil, and coconut oil. Avoid foods with partially hydrogenated oils in them. These contain trans fats. Examples are stick margarine, some tub margarines, cookies, crackers, and other baked goods. Avoid fried foods. General information Eat more home-cooked food and less restaurant, buffet, and fast food. Limit or avoid alcohol. Limit foods that are high in added sugar and simple starches such as foods made using white refined flour (white breads, pastries, sweets). Lose weight if you are overweight. Losing just 5-10% of your body weight can  help your overall health and prevent diseases such as diabetes and heart disease. Monitor your sodium intake, especially if you have high blood pressure. Talk with your health care provider about your sodium intake. Try to incorporate more vegetarian meals weekly. What foods  should I eat? Fruits All fresh, canned (in natural juice), or frozen fruits. Vegetables Fresh or frozen vegetables (raw, steamed, roasted, or grilled). Green salads. Grains Most grains. Choose whole wheat and whole grains most of the time. Rice and pasta, including brown rice and pastas made with whole wheat. Meats and other proteins Lean, well-trimmed beef, veal, pork, and lamb. Chicken and [place] without skin. All fish and shellfish. Wild duck, rabbit, pheasant, and venison. Egg whites or low-cholesterol egg substitutes. Dried beans, peas, lentils, and tofu. Seeds and most nuts. Dairy Low-fat or nonfat cheeses, including ricotta and mozzarella. Skim or 1% milk (liquid, powdered, or evaporated). Buttermilk made with low-fat milk. Nonfat or low-fat yogurt. Fats and oils Non-hydrogenated (trans-free) margarines. Vegetable oils, including soybean, sesame, sunflower, olive, avocado, peanut, safflower, corn, canola, and cottonseed. Salad dressings or mayonnaise made with a vegetable oil. Beverages Water (mineral or sparkling). Coffee and tea. Unsweetened ice tea. Diet beverages. Sweets and desserts Sherbet, gelatin, and fruit ice. Small amounts of dark chocolate. Limit all sweets and desserts. Seasonings and condiments All seasonings and condiments. The items listed above may not be a complete list of foods and beverages you can eat. Contact a dietitian for more options. What foods should I avoid? Fruits Canned fruit in heavy syrup. Fruit in cream or butter sauce. Fried fruit. Limit coconut. Vegetables Vegetables cooked in cheese, cream, or butter sauce. Fried vegetables. Grains Breads made with saturated or trans fats, oils, or whole milk. Croissants. Sweet rolls. Donuts. High-fat crackers, such as cheese crackers and chips. Meats and other proteins Fatty meats, such as hot dogs, ribs, sausage, bacon, rib-eye roast or steak. High-fat deli meats, such as salami and bologna. Caviar. Domestic  duck and goose. Organ meats, such as liver. Dairy Cream, sour cream, cream cheese, and creamed cottage cheese. Whole-milk cheeses. Whole or 2% milk (liquid, evaporated, or condensed). Whole buttermilk. Cream sauce or high-fat cheese sauce. Whole-milk yogurt. Fats and oils Meat fat, or shortening. Cocoa butter, hydrogenated oils, palm oil, coconut oil, palm kernel oil. Solid fats and shortenings, including bacon fat, salt pork, lard, and butter. Nondairy cream substitutes. Salad dressings with cheese or sour cream. Beverages Regular sodas and any drinks with added sugar. Sweets and desserts Frosting. Pudding. Cookies. Cakes. Pies. Milk chocolate or white chocolate. Buttered syrups. Full-fat ice cream or ice cream drinks. The items listed above may not be a complete list of foods and beverages to avoid. Contact a dietitian for more information. Summary Heart-healthy meal planning includes limiting unhealthy fats, increasing healthy fats, limiting salt (sodium) intake and making other diet and lifestyle changes. Lose weight if you are overweight. Losing just 5-10% of your body weight can help your overall health and prevent diseases such as diabetes and heart disease. Focus on eating a balance of foods, including fruits and vegetables, low-fat or nonfat dairy, lean protein, nuts and legumes, whole grains, and heart-healthy oils and fats. This information is not intended to replace advice given to you by your health care provider. Make sure you discuss any questions you have with your health care provider. Document Revised: 05/13/2021 Document Reviewed: 05/13/2021 Elsevier Patient Education  [place].

## 2022-05-12 NOTE — Telephone Encounter (Signed)
Requested Prescriptions  Pending Prescriptions Disp Refills   sertraline (ZOLOFT) 25 MG tablet [Pharmacy Med Name: SERTRALINE '25MG'$  TABLETS] 90 tablet 4    Sig: TAKE 1 TABLET(25 MG) BY MOUTH DAILY     Psychiatry:  Antidepressants - SSRI - sertraline Failed - 05/10/2022  5:39 PM      Failed - Valid encounter within last 6 months    Recent Outpatient Visits           6 months ago Stage 3a chronic kidney disease ([place])   [place], Henrine Screws T, NP   1 year ago Stage 3a chronic kidney disease ([place])   [place], Henrine Screws T, NP   1 year ago Stage 3a chronic kidney disease ([place])   [place], Henrine Screws T, NP   1 year ago GAD (generalized anxiety disorder)   [place], Lauren A, NP   2 years ago Closed nondisplaced transverse fracture of shaft of left tibia, initial encounter   [facility] Delsa Grana, PA-C       Future Appointments             [place], Henrine Screws T, NP [place]            Passed - AST in normal range and within 360 days    AST  Date Value Ref Range Status  11/05/2021 22 0 - 40 IU/L Final   SGOT(AST)  Date Value Ref Range Status  11/03/2011 23 15 - 37 Unit/L Final         Passed - ALT in normal range and within 360 days    ALT  Date Value Ref Range Status  11/05/2021 17 0 - 32 IU/L Final   SGPT (ALT)  Date Value Ref Range Status  11/03/2011 24 U/L Final    Comment:    12-78 NOTE: NEW REFERENCE RANGE 03/14/2011          Passed - Completed PHQ-2 or PHQ-9 in the last 360 days

## 2022-05-13 ENCOUNTER — Ambulatory Visit: Payer: Medicare PPO | Admitting: Unknown Physician Specialty

## 2022-05-13 ENCOUNTER — Encounter: Payer: Self-pay | Admitting: Unknown Physician Specialty

## 2022-05-13 VITALS — BP 138/74 | HR 58 | Temp 98.0°F | Ht <= 58 in | Wt 120.2 lb

## 2022-05-13 DIAGNOSIS — M810 Age-related osteoporosis without current pathological fracture: Secondary | ICD-10-CM | POA: Diagnosis not present

## 2022-05-13 DIAGNOSIS — E538 Deficiency of other specified B group vitamins: Secondary | ICD-10-CM | POA: Diagnosis not present

## 2022-05-13 DIAGNOSIS — F4321 Adjustment disorder with depressed mood: Secondary | ICD-10-CM | POA: Diagnosis not present

## 2022-05-13 DIAGNOSIS — D508 Other iron deficiency anemias: Secondary | ICD-10-CM | POA: Diagnosis not present

## 2022-05-13 DIAGNOSIS — Z23 Encounter for immunization: Secondary | ICD-10-CM

## 2022-05-13 DIAGNOSIS — M9080 Osteopathy in diseases classified elsewhere, unspecified site: Secondary | ICD-10-CM

## 2022-05-13 DIAGNOSIS — E559 Vitamin D deficiency, unspecified: Secondary | ICD-10-CM

## 2022-05-13 DIAGNOSIS — J069 Acute upper respiratory infection, unspecified: Secondary | ICD-10-CM

## 2022-05-13 DIAGNOSIS — N1831 Chronic kidney disease, stage 3a: Secondary | ICD-10-CM | POA: Diagnosis not present

## 2022-05-13 DIAGNOSIS — Z1159 Encounter for screening for other viral diseases: Secondary | ICD-10-CM

## 2022-05-13 DIAGNOSIS — M109 Gout, unspecified: Secondary | ICD-10-CM | POA: Diagnosis not present

## 2022-05-13 DIAGNOSIS — E782 Mixed hyperlipidemia: Secondary | ICD-10-CM | POA: Diagnosis not present

## 2022-05-13 DIAGNOSIS — I10 Essential (primary) hypertension: Secondary | ICD-10-CM | POA: Diagnosis not present

## 2022-05-13 LAB — MICROALBUMIN, URINE WAIVED
Creatinine, Urine Waived: 50 mg/dL (ref 10–300)
Microalb, Ur Waived: 30 mg/L — ABNORMAL HIGH (ref 0–19)

## 2022-05-13 MED ORDER — FLUTICASONE PROPIONATE 50 MCG/ACT NA SUSP: 2.0000 | Freq: Every day | NASAL | 6 refills | Status: DC

## 2022-05-13 MED ORDER — OLMESARTAN MEDOXOMIL-HCTZ 40-12.5 MG PO TABS: 1.0000 | ORAL_TABLET | Freq: Every day | ORAL | 1 refills | Status: DC

## 2022-05-13 MED ORDER — SERTRALINE HCL 50 MG PO TABS: 50.0000 mg | ORAL_TABLET | Freq: Every day | ORAL | 3 refills | Status: DC

## 2022-05-13 NOTE — Assessment & Plan Note (Addendum)
Normal labs last visit.  Recheck today.  Takes an iron supplement 3 days/week

## 2022-05-13 NOTE — Assessment & Plan Note (Addendum)
Not well controlled.  Add HCTZ 12.5 mg.

## 2022-05-13 NOTE — Assessment & Plan Note (Signed)
Check creatnine today

## 2022-05-13 NOTE — Progress Notes (Signed)
BP 138/74   Pulse (!) 58   Temp 98 F (36.7 C) (Oral)   Ht 4' 6.09" (1.374 m)   Wt 120 lb 3.2 oz (54.5 kg)   SpO2 98%   BMI 28.88 kg/m    Subjective:    Patient ID: [name], female    DOB: May 17, 1945, 77 y.o.   MRN: [id number]  HPI: Felicia Robinson is a 77 y.o. female  Chief Complaint  Patient presents with   Anemia   Hypertension   Hyperlipidemia   Rickets   Chronic Kidney Disease   Osteoporosis   Nasal Congestion    Head congestion, sinus pressure started last week.   Pt is here for a 6 month follow up of the above diagnosis.  Getting labs today.    Hypertension Using medications without difficulty Average home BPs - Over the last year SBP 150-160 or 170 No problems or lightheadedness No chest pain with exertion or shortness of breath No Edema States she has been under pressure and a lot of anxiety.  Wonders if she should take Buspar more frequently.    Hyperlipidemia Using medications without problems No Muscle aches  Diet compliance: Starting to eat better.   Exercise: Not exercising as just moved.    URI  This is a new problem. The current episode started in the past 7 days. The problem has been waxing and waning. There has been no fever. Associated symptoms include congestion, coughing, ear pain and rhinorrhea. Pertinent negatives include no headaches, sneezing or sore throat. She has tried nothing for the symptoms. The treatment provided no relief.    Relevant past medical, surgical, family and social history reviewed and updated as indicated. Interim medical history since our last visit reviewed. Allergies and medications reviewed and updated.  Review of Systems  HENT:  Positive for congestion, ear pain and rhinorrhea. Negative for sneezing and sore throat.   Respiratory:  Positive for cough.   Neurological:  Negative for headaches.    Per HPI unless specifically indicated above     Objective:    BP 138/74   Pulse (!) 58   Temp  98 F (36.7 C) (Oral)   Ht 4' 6.09" (1.374 m)   Wt 120 lb 3.2 oz (54.5 kg)   SpO2 98%   BMI 28.88 kg/m   Wt Readings from Last 3 Encounters:  05/13/22 120 lb 3.2 oz (54.5 kg)  11/05/21 119 lb 3.2 oz (54.1 kg)  05/06/21 115 lb 3.2 oz (52.3 kg)    Physical Exam Constitutional:      General: She is not in acute distress.    Appearance: Normal appearance. She is well-developed.  HENT:     Head: Normocephalic and atraumatic.  Eyes:     General: Lids are normal. No scleral icterus.       Right eye: No discharge.        Left eye: No discharge.     Conjunctiva/sclera: Conjunctivae normal.  Neck:     Vascular: No carotid bruit or JVD.  Cardiovascular:     Rate and Rhythm: Normal rate and regular rhythm.     Heart sounds: Normal heart sounds.  Pulmonary:     Effort: Pulmonary effort is normal.     Breath sounds: Normal breath sounds.  Abdominal:     Palpations: There is no hepatomegaly or splenomegaly.  Musculoskeletal:        General: Normal range of motion.     Cervical back: Normal range of  motion and neck supple.  Skin:    General: Skin is warm and dry.     Coloration: Skin is not pale.     Findings: No rash.  Neurological:     Mental Status: She is alert and oriented to person, place, and time.  Psychiatric:        Behavior: Behavior normal.        Thought Content: Thought content normal.        Judgment: Judgment normal.     Results for orders placed or performed in visit on 11/05/21  CBC with Differential/Platelet  Result Value Ref Range   WBC 8.0 3.4 - 10.8 x10E3/uL   RBC 4.87 3.77 - 5.28 x10E6/uL   Hemoglobin 15.0 11.1 - 15.9 g/dL   Hematocrit 44.9 34.0 - 46.6 %   MCV 92 79 - 97 fL   MCH 30.8 26.6 - 33.0 pg   MCHC 33.4 31.5 - 35.7 g/dL   RDW 13.1 11.7 - 15.4 %   Platelets 229 150 - 450 x10E3/uL   Neutrophils 62 Not Estab. %   Lymphs 26 Not Estab. %   Monocytes 7 Not Estab. %   Eos 4 Not Estab. %   Basos 1 Not Estab. %   Neutrophils Absolute 5.0 1.4 -  7.0 x10E3/uL   Lymphocytes Absolute 2.1 0.7 - 3.1 x10E3/uL   Monocytes Absolute 0.6 0.1 - 0.9 x10E3/uL   EOS (ABSOLUTE) 0.3 0.0 - 0.4 x10E3/uL   Basophils Absolute 0.1 0.0 - 0.2 x10E3/uL   Immature Granulocytes 0 Not Estab. %   Immature Grans (Abs) 0.0 0.0 - 0.1 x10E3/uL  Comprehensive metabolic panel  Result Value Ref Range   Glucose 83 70 - 99 mg/dL   BUN 22 8 - 27 mg/dL   Creatinine, Ser 1.04 (H) 0.57 - 1.00 mg/dL   eGFR 56 (L) >59 mL/min/1.73   BUN/Creatinine Ratio 21 12 - 28   Sodium 137 134 - 144 mmol/L   Potassium 4.8 3.5 - 5.2 mmol/L   Chloride 100 96 - 106 mmol/L   CO2 19 (L) 20 - 29 mmol/L   Calcium 9.7 8.7 - 10.3 mg/dL   Total Protein 7.5 6.0 - 8.5 g/dL   Albumin 4.7 3.8 - 4.8 g/dL   Globulin, Total 2.8 1.5 - 4.5 g/dL   Albumin/Globulin Ratio 1.7 1.2 - 2.2   Bilirubin Total 0.4 0.0 - 1.2 mg/dL   Alkaline Phosphatase 186 (H) 44 - 121 IU/L   AST 22 0 - 40 IU/L   ALT 17 0 - 32 IU/L  Lipid Panel w/o Chol/HDL Ratio  Result Value Ref Range   Cholesterol, Total 173 100 - 199 mg/dL   Triglycerides 419 (H) 0 - 149 mg/dL   HDL 53 >39 mg/dL   VLDL Cholesterol Cal 63 (H) 5 - 40 mg/dL   LDL Chol Calc (NIH) 57 0 - 99 mg/dL  TSH  Result Value Ref Range   TSH 1.490 0.450 - 4.500 uIU/mL  Vitamin B12  Result Value Ref Range   Vitamin B-12 1,249 (H) 232 - 1,245 pg/mL  VITAMIN D 25 Hydroxy (Vit-D Deficiency, Fractures)  Result Value Ref Range   Vit D, 25-Hydroxy 26.4 (L) 30.0 - 100.0 ng/mL  Microalbumin, Urine Waived  Result Value Ref Range   Microalb, Ur Waived 80 (H) 0 - 19 mg/L   Creatinine, Urine Waived 100 10 - 300 mg/dL   Microalb/Creat Ratio 30-300 (H) <30 mg/g  Iron Binding Cap (TIBC)([facility])  Result Value Ref Range  Total Iron Binding Capacity 467 (H) 250 - 450 ug/dL   UIBC 323 118 - 369 ug/dL   Iron 144 (H) 27 - 139 ug/dL   Iron Saturation 31 15 - 55 %  Ferritin  Result Value Ref Range   Ferritin 35 15 - 150 ng/mL      Assessment & Plan:    Problem List Items Addressed This Visit       Unprioritized   Anemia    Normal labs last visit.  Recheck today.  Takes an iron supplement 3 days/week      Relevant Orders   CBC with Differential/Platelet   Iron Binding Cap (TIBC)([facility])   Ferritin   B12 deficiency    Takes every other day      Relevant Orders   Vitamin B12   CKD (chronic kidney disease) stage 3, GFR 30-59 ml/min (HCC) - Primary    Check creatnine today      Relevant Orders   CBC with Differential/Platelet   Comprehensive metabolic panel   Microalbumin, Urine Waived   Controlled gout   Relevant Orders   Uric acid   Essential hypertension    Not well controlled.  Add HCTZ 12.5 mg.        Relevant Medications   olmesartan-hydrochlorothiazide (BENICAR HCT) 40-12.5 MG tablet   Other Relevant Orders   Comprehensive metabolic panel   Microalbumin, Urine Waived   Grief reaction    Increase Sertraline to 50 mgs.        Mixed hyperlipidemia   Relevant Medications   olmesartan-hydrochlorothiazide (BENICAR HCT) 40-12.5 MG tablet   Other Relevant Orders   Comprehensive metabolic panel   Lipid Panel w/o Chol/HDL Ratio   Osteoporosis   Vitamin D deficiency   X-linked hypophosphatemic rickets   Other Visit Diagnoses     Need for hepatitis C screening test       Hep C screen on labs today per guidelines for one time screening, discussed with patient.   Relevant Orders   Hepatitis C antibody   Flu vaccine need       Flu vaccine in office today.   Need for Td vaccine       Td vaccine in office today.   Viral upper respiratory tract infection       Rx for Flonase 2 squirts BID each nostril        Follow up plan: Return in about 4 weeks (around 06/10/2022).

## 2022-05-13 NOTE — Assessment & Plan Note (Signed)
Increase Sertraline to 50 mgs.

## 2022-05-13 NOTE — Assessment & Plan Note (Signed)
Takes every other day

## 2022-05-14 LAB — COMPREHENSIVE METABOLIC PANEL
ALT: 13 IU/L (ref 0–32)
AST: 23 IU/L (ref 0–40)
Albumin/Globulin Ratio: 1.8 (ref 1.2–2.2)
Albumin: 4.5 g/dL (ref 3.8–4.8)
Alkaline Phosphatase: 171 IU/L — ABNORMAL HIGH (ref 44–121)
BUN/Creatinine Ratio: 19 (ref 12–28)
BUN: 17 mg/dL (ref 8–27)
Bilirubin Total: 0.3 mg/dL (ref 0.0–1.2)
CO2: 21 mmol/L (ref 20–29)
Calcium: 9.1 mg/dL (ref 8.7–10.3)
Chloride: 102 mmol/L (ref 96–106)
Creatinine, Ser: 0.9 mg/dL (ref 0.57–1.00)
Globulin, Total: 2.5 g/dL (ref 1.5–4.5)
Glucose: 104 mg/dL — ABNORMAL HIGH (ref 70–99)
Potassium: 4.5 mmol/L (ref 3.5–5.2)
Sodium: 139 mmol/L (ref 134–144)
Total Protein: 7 g/dL (ref 6.0–8.5)
eGFR: 67 mL/min/{1.73_m2} (ref >59)

## 2022-05-14 LAB — LIPID PANEL W/O CHOL/HDL RATIO
Cholesterol, Total: 173 mg/dL (ref 100–199)
HDL: 64 mg/dL (ref >39)
LDL Chol Calc (NIH): 56 mg/dL (ref 0–99)
Triglycerides: 351 mg/dL — ABNORMAL HIGH (ref 0–149)
VLDL Cholesterol Cal: 53 mg/dL — ABNORMAL HIGH (ref 5–40)

## 2022-05-14 LAB — CBC WITH DIFFERENTIAL/PLATELET
Basophils Absolute: 0.1 10*3/uL (ref 0.0–0.2)
Basos: 1 %
EOS (ABSOLUTE): 0.4 10*3/uL (ref 0.0–0.4)
Eos: 5 %
Hematocrit: 45.7 % (ref 34.0–46.6)
Hemoglobin: 14.9 g/dL (ref 11.1–15.9)
Immature Grans (Abs): 0 10*3/uL (ref 0.0–0.1)
Immature Granulocytes: 0 %
Lymphocytes Absolute: 1.8 10*3/uL (ref 0.7–3.1)
Lymphs: 21 %
MCH: 29.9 pg (ref 26.6–33.0)
MCHC: 32.6 g/dL (ref 31.5–35.7)
MCV: 92 fL (ref 79–97)
Monocytes Absolute: 0.5 10*3/uL (ref 0.1–0.9)
Monocytes: 6 %
Neutrophils Absolute: 5.7 10*3/uL (ref 1.4–7.0)
Neutrophils: 67 %
Platelets: 217 10*3/uL (ref 150–450)
RBC: 4.98 x10E6/uL (ref 3.77–5.28)
RDW: 12.8 % (ref 11.7–15.4)
WBC: 8.5 10*3/uL (ref 3.4–10.8)

## 2022-05-14 LAB — HEPATITIS C ANTIBODY: Hep C Virus Ab: NONREACTIVE

## 2022-05-14 LAB — IRON AND TIBC
Iron Saturation: 15 % (ref 15–55)
Iron: 65 ug/dL (ref 27–139)
Total Iron Binding Capacity: 440 ug/dL (ref 250–450)
UIBC: 375 ug/dL — ABNORMAL HIGH (ref 118–369)

## 2022-05-14 LAB — VITAMIN B12: Vitamin B-12: 595 pg/mL (ref 232–1245)

## 2022-05-14 LAB — URIC ACID: Uric Acid: 7.5 mg/dL (ref 3.1–7.9)

## 2022-05-14 LAB — FERRITIN: Ferritin: 26 ng/mL (ref 15–150)

## 2022-05-16 ENCOUNTER — Telehealth: Payer: Self-pay | Admitting: Nurse Practitioner

## 2022-05-16 NOTE — Telephone Encounter (Signed)
Copied from [place] [id number]. Topic: Medicare AWV >> May 16, 2022  2:14 PM Devoria Glassing wrote: Reason for CRM: Left message for patient to schedule Annual Wellness Visit.  Please schedule with Health Nurse Advisor Kirke Shaggy at [facility].[place] at [phone]

## 2022-06-07 ENCOUNTER — Other Ambulatory Visit: Payer: Self-pay | Admitting: Nurse Practitioner

## 2022-06-07 DIAGNOSIS — I10 Essential (primary) hypertension: Secondary | ICD-10-CM

## 2022-06-08 NOTE — Patient Instructions (Incomplete)
Please call to schedule your mammogram and/or bone density:  - MAMMOGRAM DUE 10/18/22 get DEXA at same time [facility] at [place]: [address], [place], [place] [address] Phone: [phone]  [place] [facility] [address] [place]  [address] Phone: [phone]    DASH Eating Plan DASH stands for Dietary Approaches to Stop Hypertension. The DASH eating plan is a healthy eating plan that has been shown to: Reduce high blood pressure (hypertension). Reduce your risk for type 2 diabetes, heart disease, and stroke. Help with weight loss. What are tips for following this plan? Reading food labels Check food labels for the amount of salt (sodium) per serving. Choose foods with less than 5 percent of the Daily Value of sodium. Generally, foods with less than 300 milligrams (mg) of sodium per serving fit into this eating plan. To find whole grains, look for the word "whole" as the first word in the ingredient list. Shopping Buy products labeled as "low-sodium" or "no salt added." Buy fresh foods. Avoid canned foods and pre-made or frozen meals. Cooking Avoid adding salt when cooking. Use salt-free seasonings or herbs instead of table salt or sea salt. Check with your health care provider or pharmacist before using salt substitutes. Do not fry foods. Cook foods using healthy methods such as baking, boiling, grilling, roasting, and broiling instead. Cook with heart-healthy oils, such as olive, canola, avocado, soybean, or sunflower oil. Meal planning  Eat a balanced diet that includes: 4 or more servings of fruits and 4 or more servings of vegetables each day. Try to fill one-half of your plate with fruits and vegetables. 6-8 servings of whole grains each day. Less than 6 oz (170 g) of lean meat, poultry, or fish each day. A 3-oz (85-g) serving of meat is about the same size as a deck of cards. One egg equals 1  oz (28 g). 2-3 servings of low-fat dairy each day. One serving is 1 cup (237 mL). 1 serving of nuts, seeds, or beans 5 times each week. 2-3 servings of heart-healthy fats. Healthy fats called omega-3 fatty acids are found in foods such as walnuts, flaxseeds, fortified milks, and eggs. These fats are also found in cold-water fish, such as sardines, salmon, and mackerel. Limit how much you eat of: Canned or prepackaged foods. Food that is high in trans fat, such as some fried foods. Food that is high in saturated fat, such as fatty meat. Desserts and other sweets, sugary drinks, and other foods with added sugar. Full-fat dairy products. Do not salt foods before eating. Do not eat more than 4 egg yolks a week. Try to eat at least 2 vegetarian meals a week. Eat more home-cooked food and less restaurant, buffet, and fast food. Lifestyle When eating at a restaurant, ask that your food be prepared with less salt or no salt, if possible. If you drink alcohol: Limit how much you use to: 0-1 drink a day for women who are not pregnant. 0-2 drinks a day for men. Be aware of how much alcohol is in your drink. In the [place] one drink equals one 12 oz bottle of beer (355 mL), one 5 oz glass of wine (148 mL), or one 1 oz glass of hard liquor (44 mL). General information Avoid eating more than 2,300 mg of salt a day. If you have hypertension, you may need to reduce your sodium intake to 1,500 mg a day.  Work with your health care provider to maintain a healthy body weight or to lose weight. Ask what an ideal weight is for you. Get at least 30 minutes of exercise that causes your heart to beat faster (aerobic exercise) most days of the week. Activities may include walking, swimming, or biking. Work with your health care provider or dietitian to adjust your eating plan to your individual calorie needs. What foods should I eat? Fruits All fresh, dried, or frozen fruit. Canned fruit in natural juice (without  added sugar). Vegetables Fresh or frozen vegetables (raw, steamed, roasted, or grilled). Low-sodium or reduced-sodium tomato and vegetable juice. Low-sodium or reduced-sodium tomato sauce and tomato paste. Low-sodium or reduced-sodium canned vegetables. Grains Whole-grain or whole-wheat bread. Whole-grain or whole-wheat pasta. Brown rice. [place]. Bulgur. Whole-grain and low-sodium cereals. Pita bread. Low-fat, low-sodium crackers. Whole-wheat flour tortillas. Meats and other proteins Skinless chicken or [place]. Ground chicken or [place]. Pork with fat trimmed off. Fish and seafood. Egg whites. Dried beans, peas, or lentils. Unsalted nuts, nut butters, and seeds. Unsalted canned beans. Lean cuts of beef with fat trimmed off. Low-sodium, lean precooked or cured meat, such as sausages or meat loaves. Dairy Low-fat (1%) or fat-free (skim) milk. Reduced-fat, low-fat, or fat-free cheeses. Nonfat, low-sodium ricotta or cottage cheese. Low-fat or nonfat yogurt. Low-fat, low-sodium cheese. Fats and oils Soft margarine without trans fats. Vegetable oil. Reduced-fat, low-fat, or light mayonnaise and salad dressings (reduced-sodium). Canola, safflower, olive, avocado, soybean, and sunflower oils. Avocado. Seasonings and condiments Herbs. Spices. Seasoning mixes without salt. Other foods Unsalted popcorn and pretzels. Fat-free sweets. The items listed above may not be a complete list of foods and beverages you can eat. Contact a dietitian for more information. What foods should I avoid? Fruits Canned fruit in a light or heavy syrup. Fried fruit. Fruit in cream or butter sauce. Vegetables Creamed or fried vegetables. Vegetables in a cheese sauce. Regular canned vegetables (not low-sodium or reduced-sodium). Regular canned tomato sauce and paste (not low-sodium or reduced-sodium). Regular tomato and vegetable juice (not low-sodium or reduced-sodium). Angie Fava. Olives. Grains Baked goods made with fat,  such as croissants, muffins, or some breads. Dry pasta or rice meal packs. Meats and other proteins Fatty cuts of meat. Ribs. Fried meat. Berniece Salines. Bologna, salami, and other precooked or cured meats, such as sausages or meat loaves. Fat from the back of a pig (fatback). Bratwurst. Salted nuts and seeds. Canned beans with added salt. Canned or smoked fish. Whole eggs or egg yolks. Chicken or [place] with skin. Dairy Whole or 2% milk, cream, and half-and-half. Whole or full-fat cream cheese. Whole-fat or sweetened yogurt. Full-fat cheese. Nondairy creamers. Whipped toppings. Processed cheese and cheese spreads. Fats and oils Butter. Stick margarine. Lard. Shortening. Ghee. Bacon fat. Tropical oils, such as coconut, palm kernel, or palm oil. Seasonings and condiments Onion salt, garlic salt, seasoned salt, table salt, and sea salt. Worcestershire sauce. Tartar sauce. Barbecue sauce. Teriyaki sauce. Soy sauce, including reduced-sodium. Steak sauce. Canned and packaged gravies. Fish sauce. Oyster sauce. Cocktail sauce. Store-bought horseradish. Ketchup. Mustard. Meat flavorings and tenderizers. Bouillon cubes. Hot sauces. Pre-made or packaged marinades. Pre-made or packaged taco seasonings. Relishes. Regular salad dressings. Other foods Salted popcorn and pretzels. The items listed above may not be a complete list of foods and beverages you should avoid. Contact a dietitian for more information. Where to find more information National Heart, Lung, and Blood Institute: https://wilson-eaton.com/ American Heart Association: www.heart.org Academy of Nutrition and Dietetics: www.eatright.[place]:  www.kidney.org Summary The DASH eating plan is a healthy eating plan that has been shown to reduce high blood pressure (hypertension). It may also reduce your risk for type 2 diabetes, heart disease, and stroke. When on the DASH eating plan, aim to eat more fresh fruits and vegetables, whole grains,  lean proteins, low-fat dairy, and heart-healthy fats. With the DASH eating plan, you should limit salt (sodium) intake to 2,300 mg a day. If you have hypertension, you may need to reduce your sodium intake to 1,500 mg a day. Work with your health care provider or dietitian to adjust your eating plan to your individual calorie needs. This information is not intended to replace advice given to you by your health care provider. Make sure you discuss any questions you have with your health care provider. Document Revised: 03/11/2019 Document Reviewed: 03/11/2019 Elsevier Patient Education  [place].

## 2022-06-09 NOTE — Telephone Encounter (Signed)
Unable to refill per protocol, Rx expired. Benicar was discontinued 05/13/22. Request is too soon for Nebivolol, last refill 05/29/21 for 90 and 4 refills.  Requested Prescriptions  Pending Prescriptions Disp Refills   Nebivolol HCl 20 MG TABS [Pharmacy Med Name: NEBIVOLOL 20MG TABLETS] 90 tablet 4    Sig: TAKE 1 TABLET(20 MG) BY MOUTH DAILY     Cardiovascular: Beta Blockers 3 Passed - 06/07/2022 10:47 AM      Passed - Cr in normal range and within 360 days    Creat  Date Value Ref Range Status  08/17/2019 1.00 (H) 0.60 - 0.93 mg/dL Final    Comment:    For patients >64 years of age, the reference limit for Creatinine is approximately 13% higher for people identified as African-American. .    Creatinine, Ser  Date Value Ref Range Status  05/13/2022 0.90 0.57 - 1.00 mg/dL Final   Creatinine, Urine  Date Value Ref Range Status  03/16/2018 62 20 - 275 mg/dL Final         Passed - AST in normal range and within 360 days    AST  Date Value Ref Range Status  05/13/2022 23 0 - 40 IU/L Final   SGOT(AST)  Date Value Ref Range Status  11/03/2011 23 15 - 37 Unit/L Final         Passed - ALT in normal range and within 360 days    ALT  Date Value Ref Range Status  05/13/2022 13 0 - 32 IU/L Final   SGPT (ALT)  Date Value Ref Range Status  11/03/2011 24 U/L Final    Comment:    12-78 NOTE: NEW REFERENCE RANGE 03/14/2011          Passed - Last BP in normal range    BP Readings from Last 1 Encounters:  05/13/22 138/74         Passed - Last Heart Rate in normal range    Pulse Readings from Last 1 Encounters:  05/13/22 (!) 11         Passed - Valid encounter within last 6 months    Recent Outpatient Visits           3 weeks ago Stage 3a chronic kidney disease ([place])   [place] Kathrine Haddock, NP   7 months ago Stage 3a chronic kidney disease ([place])   [place], Henrine Screws T, NP   1 year ago Stage 3a chronic  kidney disease ([place])   [place], Henrine Screws T, NP   1 year ago Stage 3a chronic kidney disease ([place])   [place], Henrine Screws T, NP   1 year ago GAD (generalized anxiety disorder)   [place] McElwee, Scheryl Darter, NP       Future Appointments             Tomorrow [place], Henrine Screws T, NP [place]             olmesartan (BENICAR) 40 MG tablet [Pharmacy Med Name: OLMESARTAN MEDOXOMIL 40MG TABLETS] 90 tablet 4    Sig: TAKE 1 TABLET(40 MG) BY MOUTH DAILY     Cardiovascular:  Angiotensin Receptor Blockers Passed - 06/07/2022 10:47 AM      Passed - Cr in normal range and within 180 days    Creat  Date Value Ref Range Status  08/17/2019 1.00 (H) 0.60 - 0.93 mg/dL  Final    Comment:    For patients >62 years of age, the reference limit for Creatinine is approximately 13% higher for people identified as African-American. .    Creatinine, Ser  Date Value Ref Range Status  05/13/2022 0.90 0.57 - 1.00 mg/dL Final   Creatinine, Urine  Date Value Ref Range Status  03/16/2018 62 20 - 275 mg/dL Final         Passed - K in normal range and within 180 days    Potassium  Date Value Ref Range Status  05/13/2022 4.5 3.5 - 5.2 mmol/L Final  11/03/2011 4.1 3.5 - 5.1 mmol/L Final         Passed - Patient is not pregnant      Passed - Last BP in normal range    BP Readings from Last 1 Encounters:  05/13/22 138/74         Passed - Valid encounter within last 6 months    Recent Outpatient Visits           3 weeks ago Stage 3a chronic kidney disease ([place])   [place] Kathrine Haddock, NP   7 months ago Stage 3a chronic kidney disease ([place])   [place], Henrine Screws T, NP   1 year ago Stage 3a chronic kidney disease ([place])   [place], Henrine Screws T, NP   1 year ago Stage 3a chronic  kidney disease ([place])   [place], Barbaraann Faster, NP   1 year ago GAD (generalized anxiety disorder)   [place] McElwee, Scheryl Darter, NP       Future Appointments             Tomorrow Venita Lick, NP [place], [facility]

## 2022-06-10 ENCOUNTER — Encounter: Payer: Self-pay | Admitting: Nurse Practitioner

## 2022-06-10 ENCOUNTER — Ambulatory Visit: Payer: Medicare PPO | Admitting: Nurse Practitioner

## 2022-06-10 VITALS — BP 132/78 | HR 69 | Temp 98.1°F | Ht <= 58 in | Wt 119.7 lb

## 2022-06-10 DIAGNOSIS — E782 Mixed hyperlipidemia: Secondary | ICD-10-CM | POA: Diagnosis not present

## 2022-06-10 DIAGNOSIS — Z0000 Encounter for general adult medical examination without abnormal findings: Secondary | ICD-10-CM | POA: Diagnosis not present

## 2022-06-10 DIAGNOSIS — Z23 Encounter for immunization: Secondary | ICD-10-CM | POA: Diagnosis not present

## 2022-06-10 DIAGNOSIS — I10 Essential (primary) hypertension: Secondary | ICD-10-CM

## 2022-06-10 MED ORDER — OLMESARTAN MEDOXOMIL-HCTZ 40-12.5 MG PO TABS: 1.0000 | ORAL_TABLET | Freq: Every day | ORAL | 4 refills | Status: DC

## 2022-06-10 MED ORDER — ROSUVASTATIN CALCIUM 5 MG PO TABS: ORAL_TABLET | ORAL | 4 refills | Status: DC

## 2022-06-10 MED ORDER — NEBIVOLOL HCL 20 MG PO TABS: ORAL_TABLET | ORAL | 4 refills | Status: DC

## 2022-06-10 NOTE — Progress Notes (Signed)
BP 132/78 (BP Location: Left Arm, Patient Position: Sitting, Cuff Size: Normal)   Pulse 69   Temp 98.1 F (36.7 C) (Oral)   Ht 4' 6.09" (1.374 m)   Wt 119 lb 11.2 oz (54.3 kg)   SpO2 96%   BMI 28.76 kg/m    Subjective:    Patient ID: Felicia Robinson, female    DOB: 11/04/1945, 77 y.o.   MRN: [id number]  HPI: Felicia Robinson is a 77 y.o. female presenting on 06/10/2022 for Medicare Wellness and follow-up. Current medical complaints include:none  She currently lives with: significant other Menopausal Symptoms: no  HYPERTENSION Seen on 05/13/22 and added HCTZ 12.5MG.  Continues on Olmesartan and Nebivolol.   Hypertension status: stable  Satisfied with current treatment? yes Duration of hypertension: chronic BP monitoring frequency:  daily BP range: 113/61 to 144/80 -- higher level was without Bystolic -- has been improved in 110/60-70 range BP medication side effects:  no Medication compliance: good compliance Aspirin: yes Recurrent headaches: no Visual changes: no Palpitations: no Dyspnea: no Chest pain: no Lower extremity edema: no Dizzy/lightheaded: no      06/10/2022    1:54 PM 05/13/2022    1:33 PM 11/05/2021    1:09 PM 05/14/2021   10:39 AM 05/06/2021    1:44 PM  Depression screen PHQ 2/9  Decreased Interest 1 0 1 1 1  $ Down, Depressed, Hopeless 0 0 1 1 0  PHQ - 2 Score 1 0 2 2 1  $ Altered sleeping 0 0 0 0 0  Tired, decreased energy 0 0 1 0 1  Change in appetite 1 0 0 0 0  Feeling bad or failure about yourself  0 0 1 0 1  Trouble concentrating 0 0 0 0 1  Moving slowly or fidgety/restless 0 0 0 0 0  Suicidal thoughts 0 0 0 0 0  PHQ-9 Score 2 0 4 2 4  $ Difficult doing work/chores Not difficult at all Not difficult at all Not difficult at all         06/10/2022    1:55 PM 05/13/2022    1:33 PM 11/05/2021    1:54 PM 11/05/2021    1:10 PM  GAD 7 : Generalized Anxiety Score  Nervous, Anxious, on Edge 1 0 1 1  Control/stop worrying 0 0 1 1  Worry too much -  different things 0 0 1 1  Trouble relaxing 1 0 0   Restless 0 0 0 0  Easily annoyed or irritable 0 0 0 0  Afraid - awful might happen 1 0 0   Total GAD 7 Score 3 0 3   Anxiety Difficulty Not difficult at all Not difficult at all Not difficult at all Not difficult at all      05/06/2021    1:48 PM 05/14/2021   10:37 AM 11/05/2021    1:09 PM 05/13/2022    1:18 PM 06/10/2022    1:59 PM  Fall Risk  Falls in the past year?   0 1 0  Was there an injury with Fall? 0 0 0 1 0  Fall Risk Category Calculator   0 3 0  Fall Risk Category (Retired)   Low    (RETIRED) Patient Fall Risk Level Low fall risk Low fall risk Low fall risk    Patient at Risk for Falls Due to No Fall Risks  No Fall Risks History of fall(s) No Fall Risks  Fall risk Follow up Falls evaluation completed Falls evaluation  completed Falls evaluation completed Falls evaluation completed Falls prevention discussed    Functional Status Survey: Is the patient deaf or have difficulty hearing?: No Does the patient have difficulty seeing, even when wearing glasses/contacts?: No Does the patient have difficulty concentrating, remembering, or making decisions?: No Does the patient have difficulty walking or climbing stairs?: No Does the patient have difficulty dressing or bathing?: No Does the patient have difficulty doing errands alone such as visiting a doctor's office or shopping?: No    Past Medical History:  Past Medical History:  Diagnosis Date   Breast cancer ([place]) 2013   left breast   Cancer ([place]) 2013   L breast   Disorders of phosphorus metabolism    rickets   Fibroadenosis of breast    Gout 2013   Hypertension 2009   Malignant neoplasm of upper-outer quadrant of female breast ([place]) 09/30/2011   7 mm low-grade invasive mammary carcinoma with focal DCIS. ER 90%, PR 1%, HER-2/neu not overexpressing.   Osteomalacia    Shingles 2013    Surgical History:  Past Surgical History:  Procedure Laterality Date   BREAST  SURGERY Left 09/30/2011   Left simple mastectomy with sentinel node biopsy   CATARACT EXTRACTION, BILATERAL Bilateral 04/2015   Dr. Tommy Rainwater in [place]  2012   [place] Left 2013   with SN biopsy   TONSILLECTOMY  1953   UPPER GI ENDOSCOPY  2012    Medications:  Current Outpatient Medications on File Prior to Visit  Medication Sig   aspirin 81 MG chewable tablet Chew 81 mg by mouth daily.   Burosumab-twza (CRYSVITA) 30 MG/ML SOLN Inject 30 mg into the skin every 30 (thirty) days.   Cholecalciferol (VITAMIN D3) 50 MCG (2000 UT) TABS Take 2,000 Units by mouth daily.   Cyanocobalamin (VITAMIN B12 PO) Take 3,000 mcg by mouth daily.   Ferrous Sulfate (SLOW FE) 142 (45 Fe) MG TBCR Take 1 tablet by mouth daily. Take with a little orange juice.   fluticasone (FLONASE) 50 MCG/ACT nasal spray Place 2 sprays into both nostrils daily.   ibuprofen (ADVIL) 200 MG tablet Take 200 mg by mouth every 6 (six) hours as needed.   olmesartan-hydrochlorothiazide (BENICAR HCT) 40-12.5 MG tablet Take 1 tablet by mouth daily.   rosuvastatin (CRESTOR) 5 MG tablet TAKE 1 TABLET(5 MG) BY MOUTH DAILY   sertraline (ZOLOFT) 50 MG tablet Take 1 tablet (50 mg total) by mouth daily.   busPIRone (BUSPAR) 5 MG tablet Take 1 tablet (5 mg total) by mouth 3 (three) times daily as needed. (Patient not taking: Reported on 06/10/2022)   No current facility-administered medications on file prior to visit.    Allergies:  Allergies  Allergen Reactions   Allopurinol     diarrhea   Codeine Nausea And Vomiting   Other     Pain medications Altered mental status   Shellfish Allergy Diarrhea and Nausea And Vomiting    Just when eating clams Just when eating clams Just when eating clams     Social History:  Social History   Socioeconomic History   Marital status: Widowed    Spouse name: Isabell Jarvis   Number of children: 1   Years of education: some college   Highest  education level: 12th grade  Occupational History   Occupation: Retired  Tobacco Use   Smoking status: Former    Packs/day: 0.50    Years: 30.00    Total  pack years: 15.00    Types: Cigarettes    Quit date: 04/21/2006    Years since quitting: 16.1   Smokeless tobacco: Never  Vaping Use   Vaping Use: Never used  Substance and Sexual Activity   Alcohol use: Yes    Alcohol/week: 1.0 standard drink of alcohol    Types: 1 Glasses of wine per week   Drug use: No   Sexual activity: Not Currently  Other Topics Concern   Not on file  Social History Narrative   Not on file   Social Determinants of Health   Financial Resource Strain: Low Risk  (06/10/2022)   Overall Financial Resource Strain (CARDIA)    Difficulty of Paying Living Expenses: Not hard at all  Food Insecurity: No Food Insecurity (06/10/2022)   Hunger Vital Sign    Worried About Running Out of Food in the Last Year: Never true    Ran Out of Food in the Last Year: Never true  Transportation Needs: No Transportation Needs (06/10/2022)   PRAPARE - Hydrologist (Medical): No    Lack of Transportation (Non-Medical): No  Physical Activity: Insufficiently Active (06/10/2022)   Exercise Vital Sign    Days of Exercise per Week: 4 days    Minutes of Exercise per Session: 30 min  Stress: No Stress Concern Present (06/10/2022)   [place]    Feeling of Stress : Only a little  Social Connections: Socially Integrated (06/10/2022)   Social Connection and Isolation Panel [NHANES]    Frequency of Communication with Friends and Family: More than three times a week    Frequency of Social Gatherings with Friends and Family: More than three times a week    Attends Religious Services: More than 4 times per year    Active Member of [employer] or Organizations: Yes    Attends Archivist Meetings: Never    Marital Status: Married  Arboriculturist Violence: Not At Risk (06/10/2022)   Humiliation, Afraid, Rape, and Kick questionnaire    Fear of Current or Ex-Partner: No    Emotionally Abused: No    Physically Abused: No    Sexually Abused: No   Social History   Tobacco Use  Smoking Status Former   Packs/day: 0.50   Years: 30.00   Total pack years: 15.00   Types: Cigarettes   Quit date: 04/21/2006   Years since quitting: 16.1  Smokeless Tobacco Never   Social History   Substance and Sexual Activity  Alcohol Use Yes   Alcohol/week: 1.0 standard drink of alcohol   Types: 1 Glasses of wine per week    Family History:  Family History  Problem Relation Age of Onset   Alzheimer's disease Mother    Stroke Father    Lymphoma Brother 30       Hodgkin's Lymphoma   Cancer Brother        non hodgkins lymphoma    Heart disease Brother    Rickets Son    Cancer Maternal Uncle 2       cancer of the esophagus   Heart disease Maternal Grandfather    Breast cancer Neg Hx     Past medical history, surgical history, medications, allergies, family history and social history reviewed with patient today and changes made to appropriate areas of the chart.   ROS All other ROS negative except what is listed above and in the HPI.  Objective:    BP 132/78 (BP Location: Left Arm, Patient Position: Sitting, Cuff Size: Normal)   Pulse 69   Temp 98.1 F (36.7 C) (Oral)   Ht 4' 6.09" (1.374 m)   Wt 119 lb 11.2 oz (54.3 kg)   SpO2 96%   BMI 28.76 kg/m   Wt Readings from Last 3 Encounters:  06/10/22 119 lb 11.2 oz (54.3 kg)  05/13/22 120 lb 3.2 oz (54.5 kg)  11/05/21 119 lb 3.2 oz (54.1 kg)    Physical Exam Vitals and nursing note reviewed.  Constitutional:      General: She is awake. She is not in acute distress.    Appearance: She is well-developed and well-groomed. She is not ill-appearing or toxic-appearing.  HENT:     Head: Normocephalic.     Right Ear: Hearing and external ear normal.     Left Ear: Hearing  and external ear normal.  Eyes:     General: Lids are normal.        Right eye: No discharge.        Left eye: No discharge.     Conjunctiva/sclera: Conjunctivae normal.     Pupils: Pupils are equal, round, and reactive to light.  Neck:     Thyroid: No thyromegaly.     Vascular: No carotid bruit.  Cardiovascular:     Rate and Rhythm: Normal rate and regular rhythm.     Pulses: Normal pulses.     Heart sounds: Normal heart sounds. No murmur heard.    No gallop.  Pulmonary:     Effort: Pulmonary effort is normal. No accessory muscle usage or respiratory distress.     Breath sounds: Normal breath sounds.  Abdominal:     General: Bowel sounds are normal. There is no distension.     Palpations: Abdomen is soft.     Tenderness: There is no abdominal tenderness.  Musculoskeletal:     Cervical back: Normal range of motion and neck supple.     Right lower leg: No edema.     Left lower leg: No edema.  Lymphadenopathy:     Cervical: No cervical adenopathy.  Skin:    General: Skin is warm.     Capillary Refill: Capillary refill takes less than 2 seconds.  Neurological:     Mental Status: She is alert and oriented to person, place, and time.     Deep Tendon Reflexes: Reflexes are normal and symmetric.  Psychiatric:        Attention and Perception: Attention normal.        Mood and Affect: Mood normal.        Speech: Speech normal.        Behavior: Behavior normal. Behavior is cooperative.        Thought Content: Thought content normal.       06/10/2022    2:11 PM 11/16/2018    1:52 PM 11/10/2017    1:06 PM  6CIT Screen  What Year? 0 points 0 points 0 points  What month? 0 points 0 points 0 points  What time? 0 points 0 points 0 points  Count back from 20 0 points 0 points 0 points  Months in reverse 0 points 0 points 0 points  Repeat phrase 0 points 0 points 0 points  Total Score 0 points 0 points 0 points   Results for orders placed or performed in visit on 05/13/22  CBC  with Differential/Platelet  Result Value Ref Range   WBC 8.5  3.4 - 10.8 x10E3/uL   RBC 4.98 3.77 - 5.28 x10E6/uL   Hemoglobin 14.9 11.1 - 15.9 g/dL   Hematocrit 45.7 34.0 - 46.6 %   MCV 92 79 - 97 fL   MCH 29.9 26.6 - 33.0 pg   MCHC 32.6 31.5 - 35.7 g/dL   RDW 12.8 11.7 - 15.4 %   Platelets 217 150 - 450 x10E3/uL   Neutrophils 67 Not Estab. %   Lymphs 21 Not Estab. %   Monocytes 6 Not Estab. %   Eos 5 Not Estab. %   Basos 1 Not Estab. %   Neutrophils Absolute 5.7 1.4 - 7.0 x10E3/uL   Lymphocytes Absolute 1.8 0.7 - 3.1 x10E3/uL   Monocytes Absolute 0.5 0.1 - 0.9 x10E3/uL   EOS (ABSOLUTE) 0.4 0.0 - 0.4 x10E3/uL   Basophils Absolute 0.1 0.0 - 0.2 x10E3/uL   Immature Granulocytes 0 Not Estab. %   Immature Grans (Abs) 0.0 0.0 - 0.1 x10E3/uL  Comprehensive metabolic panel  Result Value Ref Range   Glucose 104 (H) 70 - 99 mg/dL   BUN 17 8 - 27 mg/dL   Creatinine, Ser 0.90 0.57 - 1.00 mg/dL   eGFR 67 >59 mL/min/1.73   BUN/Creatinine Ratio 19 12 - 28   Sodium 139 134 - 144 mmol/L   Potassium 4.5 3.5 - 5.2 mmol/L   Chloride 102 96 - 106 mmol/L   CO2 21 20 - 29 mmol/L   Calcium 9.1 8.7 - 10.3 mg/dL   Total Protein 7.0 6.0 - 8.5 g/dL   Albumin 4.5 3.8 - 4.8 g/dL   Globulin, Total 2.5 1.5 - 4.5 g/dL   Albumin/Globulin Ratio 1.8 1.2 - 2.2   Bilirubin Total 0.3 0.0 - 1.2 mg/dL   Alkaline Phosphatase 171 (H) 44 - 121 IU/L   AST 23 0 - 40 IU/L   ALT 13 0 - 32 IU/L  Lipid Panel w/o Chol/HDL Ratio  Result Value Ref Range   Cholesterol, Total 173 100 - 199 mg/dL   Triglycerides 351 (H) 0 - 149 mg/dL   HDL 64 >39 mg/dL   VLDL Cholesterol Cal 53 (H) 5 - 40 mg/dL   LDL Chol Calc (NIH) 56 0 - 99 mg/dL  Iron Binding Cap (TIBC)([facility])  Result Value Ref Range   Total Iron Binding Capacity 440 250 - 450 ug/dL   UIBC 375 (H) 118 - 369 ug/dL   Iron 65 27 - 139 ug/dL   Iron Saturation 15 15 - 55 %  Ferritin  Result Value Ref Range   Ferritin 26 15 - 150 ng/mL  Vitamin B12   Result Value Ref Range   Vitamin B-12 595 232 - 1,245 pg/mL  Microalbumin, Urine Waived  Result Value Ref Range   Microalb, Ur Waived 30 (H) 0 - 19 mg/L   Creatinine, Urine Waived 50 10 - 300 mg/dL   Microalb/Creat Ratio 30-300 (H) <30 mg/g  Uric acid  Result Value Ref Range   Uric Acid 7.5 3.1 - 7.9 mg/dL  Hepatitis C antibody  Result Value Ref Range   Hep C Virus Ab Non Reactive Non Reactive      Assessment & Plan:   Problem List Items Addressed This Visit       Cardiovascular and Mediastinum   Essential hypertension    Chronic, with BP improved.  BP at goal on recheck today and at goal on home readings.  Recommend she monitor BP at least a few mornings a week at home and document.  DASH diet at home.  Continue current medication regimen and adjust as needed.  Labs up to date.  Return in July for physical.       Relevant Medications   Nebivolol HCl 20 MG TABS   Other Visit Diagnoses     Medicare annual wellness visit, subsequent    -  Primary   Medicare Wellness due and performed with patient today.   Need for Td vaccine       Td vaccine today in office.   Relevant Orders   Td vaccine greater than or equal to 7yo preservative free IM (Completed)        Follow up plan: Return in about 5 months (around 11/17/2022) for Annual physical.   LABORATORY TESTING:  - Pap smear: not applicable  IMMUNIZATIONS:   - Tdap: Tetanus vaccination status reviewed: Td vaccination indicated and given today. - Influenza: Refused - Pneumovax: Up to date - Prevnar: Up to date - COVID: Up to date - HPV: Not applicable - Shingrix vaccine: Refused  SCREENING: -Mammogram: Up to date  - Colonoscopy: Up to date  - Bone Density: Ordered today  -Hearing Test: Not applicable  -Spirometry: Not applicable   PATIENT COUNSELING:   Advised to take 1 mg of folate supplement per day if capable of pregnancy.   Sexuality: Discussed sexually transmitted diseases, partner selection, use of  condoms, avoidance of unintended pregnancy  and contraceptive alternatives.   Advised to avoid cigarette smoking.  I discussed with the patient that most people either abstain from alcohol or drink within safe limits (<=14/week and <=4 drinks/occasion for males, <=7/weeks and <= 3 drinks/occasion for females) and that the risk for alcohol disorders and other health effects rises proportionally with the number of drinks per week and how often a drinker exceeds daily limits.  Discussed cessation/primary prevention of drug use and availability of treatment for abuse.   Diet: Encouraged to adjust caloric intake to maintain  or achieve ideal body weight, to reduce intake of dietary saturated fat and total fat, to limit sodium intake by avoiding high sodium foods and not adding table salt, and to maintain adequate dietary potassium and calcium preferably from fresh fruits, vegetables, and low-fat dairy products.    Stressed the importance of regular exercise  Injury prevention: Discussed safety belts, safety helmets, smoke detector, smoking near bedding or upholstery.   Dental health: Discussed importance of regular tooth brushing, flossing, and dental visits.    NEXT PREVENTATIVE PHYSICAL DUE IN 1 YEAR. Return in about 5 months (around 11/17/2022) for Annual physical.

## 2022-06-10 NOTE — Addendum Note (Signed)
Addended by: Venita Lick on: 06/10/2022 03:31 PM   Modules accepted: Orders

## 2022-06-10 NOTE — Assessment & Plan Note (Signed)
Chronic, with BP improved.  BP at goal on recheck today and at goal on home readings.  Recommend she monitor BP at least a few mornings a week at home and document.  DASH diet at home.  Continue current medication regimen and adjust as needed.  Labs up to date.  Return in July for physical.

## 2022-09-24 ENCOUNTER — Other Ambulatory Visit: Payer: Self-pay | Admitting: Nurse Practitioner

## 2022-09-24 DIAGNOSIS — M9080 Osteopathy in diseases classified elsewhere, unspecified site: Secondary | ICD-10-CM

## 2022-09-24 DIAGNOSIS — M810 Age-related osteoporosis without current pathological fracture: Secondary | ICD-10-CM

## 2022-09-24 DIAGNOSIS — Z1231 Encounter for screening mammogram for malignant neoplasm of breast: Secondary | ICD-10-CM

## 2022-10-20 ENCOUNTER — Ambulatory Visit
Admission: RE | Admit: 2022-10-20 | Discharge: 2022-10-20 | Disposition: A | Discharge status: Home / Self Care | Payer: Medicare PPO | Source: Ambulatory Visit | Attending: Nurse Practitioner | Admitting: Nurse Practitioner

## 2022-10-20 ENCOUNTER — Other Ambulatory Visit: Payer: Self-pay | Admitting: Nurse Practitioner

## 2022-10-20 DIAGNOSIS — Z1231 Encounter for screening mammogram for malignant neoplasm of breast: Secondary | ICD-10-CM

## 2022-10-22 NOTE — Progress Notes (Signed)
Contacted via MyChart   Normal mammogram, may repeat in one year:)

## 2022-11-09 NOTE — Patient Instructions (Incomplete)
Be Involved in Caring For Your Health:  Taking Medications When medications are taken as directed, they can greatly improve your health. But if they are not taken as prescribed, they may not work. In some cases, not taking them correctly can be harmful. To help ensure your treatment remains effective and safe, understand your medications and how to take them. Bring your medications to each visit for review by your provider.  Your lab results, notes, and after visit summary will be available on My Chart. We strongly encourage you to use this feature. If lab results are abnormal the clinic will contact you with the appropriate steps. If the clinic does not contact you assume the results are satisfactory. You can always view your results on My Chart. If you have questions regarding your health or results, please contact the clinic during office hours. You can also ask questions on My Chart.  We at [facility] are grateful that you chose [place] to provide your care. We strive to provide evidence-based and compassionate care and are always looking for feedback. If you get a survey from the clinic please complete this so we can hear your opinions.  Healthy Eating, Adult Healthy eating may help you get and keep a healthy body weight, reduce the risk of chronic disease, and live a long and productive life. It is important to follow a healthy eating pattern. Your nutritional and calorie needs should be met mainly by different nutrient-rich foods. What are tips for following this plan? Reading food labels Read labels and choose the following: Reduced or low sodium products. Juices with 100% fruit juice. Foods with low saturated fats (<3 g per serving) and high polyunsaturated and monounsaturated fats. Foods with whole grains, such as whole wheat, cracked wheat, brown rice, and wild rice. Whole grains that are fortified with folic acid. This is recommended for females who are pregnant or who want to  become pregnant. Read labels and do not eat or drink the following: Foods or drinks with added sugars. These include foods that contain brown sugar, corn sweetener, corn syrup, dextrose, fructose, glucose, high-fructose corn syrup, honey, invert sugar, lactose, malt syrup, maltose, molasses, raw sugar, sucrose, trehalose, or turbinado sugar. Limit your intake of added sugars to less than 10% of your total daily calories. Do not eat more than the following amounts of added sugar per day: 6 teaspoons (25 g) for females. 9 teaspoons (38 g) for males. Foods that contain processed or refined starches and grains. Refined grain products, such as white flour, degermed cornmeal, white bread, and white rice. Shopping Choose nutrient-rich snacks, such as vegetables, whole fruits, and nuts. Avoid high-calorie and high-sugar snacks, such as potato chips, fruit snacks, and candy. Use oil-based dressings and spreads on foods instead of solid fats such as butter, margarine, sour cream, or cream cheese. Limit pre-made sauces, mixes, and "instant" products such as flavored rice, instant noodles, and ready-made pasta. Try more plant-protein sources, such as tofu, tempeh, black beans, edamame, lentils, nuts, and seeds. Explore eating plans such as the Mediterranean diet or vegetarian diet. Try heart-healthy dips made with beans and healthy fats like hummus and guacamole. Vegetables go great with these. Cooking Use oil to saut or stir-fry foods instead of solid fats such as butter, margarine, or lard. Try baking, boiling, grilling, or broiling instead of frying. Remove the fatty part of meats before cooking. Steam vegetables in water or broth. Meal planning  At meals, imagine dividing your plate into fourths: One-half of  your plate is fruits and vegetables. One-fourth of your plate is whole grains. One-fourth of your plate is protein, especially lean meats, poultry, eggs, tofu, beans, or nuts. Include low-fat  dairy as part of your daily diet. Lifestyle Choose healthy options in all settings, including home, work, school, restaurants, or stores. Prepare your food safely: Wash your hands after handling raw meats. Where you prepare food, keep surfaces clean by regularly washing with hot, soapy water. Keep raw meats separate from ready-to-eat foods, such as fruits and vegetables. Cook seafood, meat, poultry, and eggs to the recommended temperature. Get a food thermometer. Store foods at safe temperatures. In general: Keep cold foods at 48F (4.4C) or below. Keep hot foods at 148F (60C) or above. Keep your freezer at [facility] (-17.8C) or below. Foods are not safe to eat if they have been between the temperatures of 40-148F (4.4-60C) for more than 2 hours. What foods should I eat? Fruits Aim to eat 1-2 cups of fresh, canned (in natural juice), or frozen fruits each day. One cup of fruit equals 1 small apple, 1 large banana, 8 large strawberries, 1 cup (237 g) canned fruit,  cup (82 g) dried fruit, or 1 cup (240 mL) 100% juice. Vegetables Aim to eat 2-4 cups of fresh and frozen vegetables each day, including different varieties and colors. One cup of vegetables equals 1 cup (91 g) broccoli or cauliflower florets, 2 medium carrots, 2 cups (150 g) raw, leafy greens, 1 large tomato, 1 large bell pepper, 1 large sweet potato, or 1 medium white potato. Grains Aim to eat 5-10 ounce-equivalents of whole grains each day. Examples of 1 ounce-equivalent of grains include 1 slice of bread, 1 cup (40 g) ready-to-eat cereal, 3 cups (24 g) popcorn, or  cup (93 g) cooked rice. Meats and other proteins Try to eat 5-7 ounce-equivalents of protein each day. Examples of 1 ounce-equivalent of protein include 1 egg,  oz nuts (12 almonds, 24 pistachios, or 7 walnut halves), 1/4 cup (90 g) cooked beans, 6 tablespoons (90 g) hummus or 1 tablespoon (16 g) peanut butter. A cut of meat or fish that is the size of a deck of  cards is about 3-4 ounce-equivalents (85 g). Of the protein you eat each week, try to have at least 8 sounce (227 g) of seafood. This is about 2 servings per week. This includes salmon, trout, herring, sardines, and anchovies. Dairy Aim to eat 3 cup-equivalents of fat-free or low-fat dairy each day. Examples of 1 cup-equivalent of dairy include 1 cup (240 mL) milk, 8 ounces (250 g) yogurt, 1 ounces (44 g) natural cheese, or 1 cup (240 mL) fortified soy milk. Fats and oils Aim for about 5 teaspoons (21 g) of fats and oils per day. Choose monounsaturated fats, such as canola and olive oils, mayonnaise made with olive oil or avocado oil, avocados, peanut butter, and most nuts, or polyunsaturated fats, such as sunflower, corn, and soybean oils, walnuts, pine nuts, sesame seeds, sunflower seeds, and flaxseed. Beverages Aim for 6 eight-ounce glasses of water per day. Limit coffee to 3-5 eight-ounce cups per day. Limit caffeinated beverages that have added calories, such as soda and energy drinks. If you drink alcohol: Limit how much you have to: 0-1 drink a day if you are female. 0-2 drinks a day if you are female. Know how much alcohol is in your drink. In the [place] one drink is one 12 oz bottle of beer (355 mL), one 5 oz glass of wine (  148 mL), or one 1 oz glass of hard liquor (44 mL). Seasoning and other foods Try not to add too much salt to your food. Try using herbs and spices instead of salt. Try not to add sugar to food. This information is based on [place] nutrition guidelines. To learn more, visit [employer]. Exact amounts may vary. You may need different amounts. This information is not intended to replace advice given to you by your health care provider. Make sure you discuss any questions you have with your health care provider. Document Revised: 01/06/2022 Document Reviewed: 01/06/2022 Elsevier Patient Education  2024 [employer].

## 2022-11-11 ENCOUNTER — Ambulatory Visit (INDEPENDENT_AMBULATORY_CARE_PROVIDER_SITE_OTHER): Payer: Medicare PPO | Admitting: Nurse Practitioner

## 2022-11-11 ENCOUNTER — Encounter: Payer: Self-pay | Admitting: Nurse Practitioner

## 2022-11-11 VITALS — BP 130/78 | HR 65 | Temp 98.0°F | Ht <= 58 in | Wt 126.0 lb

## 2022-11-11 DIAGNOSIS — E782 Mixed hyperlipidemia: Secondary | ICD-10-CM

## 2022-11-11 DIAGNOSIS — M9080 Osteopathy in diseases classified elsewhere, unspecified site: Secondary | ICD-10-CM | POA: Diagnosis not present

## 2022-11-11 DIAGNOSIS — D508 Other iron deficiency anemias: Secondary | ICD-10-CM | POA: Diagnosis not present

## 2022-11-11 DIAGNOSIS — E538 Deficiency of other specified B group vitamins: Secondary | ICD-10-CM

## 2022-11-11 DIAGNOSIS — M810 Age-related osteoporosis without current pathological fracture: Secondary | ICD-10-CM | POA: Diagnosis not present

## 2022-11-11 DIAGNOSIS — M109 Gout, unspecified: Secondary | ICD-10-CM | POA: Diagnosis not present

## 2022-11-11 DIAGNOSIS — N1831 Chronic kidney disease, stage 3a: Secondary | ICD-10-CM | POA: Diagnosis not present

## 2022-11-11 DIAGNOSIS — F4321 Adjustment disorder with depressed mood: Secondary | ICD-10-CM

## 2022-11-11 DIAGNOSIS — Z0000 Encounter for general adult medical examination without abnormal findings: Secondary | ICD-10-CM

## 2022-11-11 DIAGNOSIS — I10 Essential (primary) hypertension: Secondary | ICD-10-CM | POA: Diagnosis not present

## 2022-11-11 DIAGNOSIS — E559 Vitamin D deficiency, unspecified: Secondary | ICD-10-CM | POA: Diagnosis not present

## 2022-11-11 DIAGNOSIS — Z853 Personal history of malignant neoplasm of breast: Secondary | ICD-10-CM

## 2022-11-11 MED ORDER — SERTRALINE HCL 50 MG PO TABS: 75.0000 mg | ORAL_TABLET | Freq: Every day | ORAL | 4 refills | Status: DC

## 2022-11-11 NOTE — Assessment & Plan Note (Signed)
History of, no current medications.  Check labs today.

## 2022-11-11 NOTE — Assessment & Plan Note (Signed)
Chronic, ongoing.  Followed at [facility] for rickets and obtains injections (Crysvita).  Due for DEXA, she is scheduled for this upcoming and will review upon completion, will reach out to endo once returns to see if need for bisphosphonate with patient.

## 2022-11-11 NOTE — Assessment & Plan Note (Addendum)
Followed by endocrinology at [facility] and receives monthly injections, continue this collaboration.  Recent labs reviewed and notes.  Continue Vit D supplement as recommended by them.

## 2022-11-11 NOTE — Assessment & Plan Note (Signed)
With underlying rickets, followed by [facility] endocrinology, continue supplement and current collaboration.

## 2022-11-11 NOTE — Assessment & Plan Note (Signed)
Chronic, ongoing.  Continue current medication regimen and adjust as needed.  Obtain lab today.

## 2022-11-11 NOTE — Assessment & Plan Note (Signed)
Chronic, stable.  BP at goal for age at home and on check today.  Recommend she monitor BP at least a few mornings a week at home and document.  DASH diet at home.  Continue current medication regimen and adjust as needed.  Labs: CBC, CMP, TSH.  Return in 6 months.

## 2022-11-11 NOTE — Assessment & Plan Note (Signed)
Followed by [facility] endocrinology, recent lab stable -- recheck today and continue supplement.

## 2022-11-11 NOTE — Progress Notes (Signed)
BP 130/78 (BP Location: Right Arm, Patient Position: Sitting, Cuff Size: Normal)   Pulse 65   Temp 98 F (36.7 C) (Oral)   Ht 4' 6.09" (1.374 m)   Wt 126 lb (57.2 kg)   SpO2 98%   BMI 30.27 kg/m    Subjective:    Patient ID: Felicia Robinson, female    DOB: 1945/07/01, 77 y.o.   MRN: [id number]  HPI: Felicia Robinson is a 77 y.o. female presenting on 11/11/2022 for Annual Physical Exam. Current medical complaints include:none  She currently lives with: significant other Menopausal Symptoms: no  HYPERTENSION/HLD Continues on Olmesartan-HCTZ and Nebivolol.  Taking Rosuvastatin for 5 MG.  Is noticing some occasional tremors upper body, some days more prevalent and other days not present -- father had a tremor. Hypertension status: stable  Satisfied with current treatment? yes Duration of hypertension: chronic BP monitoring frequency:  occasionally BP range: 130/70 range BP medication side effects:  no Medication compliance: good compliance Aspirin: yes Recurrent headaches: no Visual changes: no Palpitations: no Dyspnea: no Chest pain: no Lower extremity edema: no Dizzy/lightheaded: no  The 10-year ASCVD risk score (Arnett DK, et al., 2019) is: 23.9%   Values used to calculate the score:     Age: 5 years     Sex: Female     Is Non-Hispanic African American: No     Diabetic: No     Tobacco smoker: No     Systolic Blood Pressure: 130 mmHg     Is BP treated: Yes     HDL Cholesterol: 64 mg/dL     Total Cholesterol: 173 mg/dL  OSTEOPOROSIS Scheduled for next scan July 2024. Satisfied with current treatment?: yes Medication side effects: no Medication compliance: good compliance Past osteoporosis medications/treatments:  Adequate calcium & vitamin D: yes Intolerance to bisphosphonates:no Weight bearing exercises: yes   CHRONIC KIDNEY DISEASE Has Rickets which is followed by endo at [facility] -- taking Vitamin D supplement.  Last visit was 01/29/22, PTH was elevated  at time 166. Labs have shown CKD 3a in past, but recent was stable. CKD status: controlled Medications renally dose: no Previous renal evaluation: yes Pneumovax:  Up to Date Influenza Vaccine:  Up to Date    ANEMIA Continues on Ferrous Sulfate and B12. 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 Mood status: stable Satisfied with current treatment?: yes Symptom severity: moderate  Duration of current treatment : chronic Side effects: no Medication compliance: good compliance Psychotherapy/counseling: none Depressed mood: yes Anxious mood: yes Anhedonia:  a little bit Significant weight loss or gain: no Insomnia: none Fatigue: no Feelings of worthlessness or guilt: sometimes Impaired concentration/indecisiveness: no Suicidal ideations: no Hopelessness: no Crying spells: yes    11/11/2022   11:07 AM 06/10/2022    1:54 PM 05/13/2022    1:33 PM 11/05/2021    1:09 PM 05/14/2021   10:39 AM  Depression screen PHQ 2/9  Decreased Interest 2 1 0 1 1  Down, Depressed, Hopeless 1 0 0 1 1  PHQ - 2 Score 3 1 0 2 2  Altered sleeping 0 0 0 0 0  Tired, decreased energy 0 0 0 1 0  Change in appetite 2 1 0 0 0  Feeling bad or failure about yourself  1 0 0 1 0  Trouble concentrating 1 0 0 0 0  Moving slowly or fidgety/restless 0 0 0 0 0  Suicidal thoughts 0 0 0 0 0  PHQ-9 Score 7 2 0 4 2  Difficult doing work/chores Somewhat difficult Not difficult at all Not difficult at all Not difficult at all        11/11/2022   11:07 AM 06/10/2022    1:55 PM 05/13/2022    1:33 PM 11/05/2021    1:54 PM  GAD 7 :  Generalized Anxiety Score  Nervous, Anxious, on Edge 1 1 0 1  Control/stop worrying 1 0 0 1  Worry too much - different things 1 0 0 1  Trouble relaxing 1 1 0 0  Restless 1 0 0 0  Easily annoyed or irritable 1 0 0 0  Afraid - awful might happen 1 1 0 0  Total GAD 7 Score 7 3 0 3  Anxiety Difficulty Somewhat difficult Not difficult at all Not difficult at all Not difficult at all      05/14/2021   10:37 AM 11/05/2021    1:09 PM 05/13/2022    1:18 PM 06/10/2022    1:59 PM 11/11/2022   11:08 AM  Fall Risk  Falls in the past year?  0 1 0 0  Was there an injury with Fall? 0 0 1 0 0  Fall Risk Category Calculator  0 3 0 0  Fall Risk Category (Retired)  Low     (RETIRED) Patient Fall Risk Level Low fall risk Low fall risk     Patient at Risk for Falls Due to  No Fall Risks History of fall(s) No Fall Risks No Fall Risks  Fall risk Follow up Falls evaluation completed Falls evaluation completed Falls evaluation completed Falls prevention discussed Falls evaluation completed    Functional Status Survey: Is the patient deaf or have difficulty hearing?: No Does the patient have difficulty seeing, even when wearing glasses/contacts?: No Does the patient have difficulty concentrating, remembering, or making decisions?: No Does the patient have difficulty walking or climbing stairs?: No Does the patient have difficulty dressing or bathing?: No Does the patient have difficulty doing errands alone such as visiting a doctor's office or shopping?: No    Past Medical History:  Past Medical History:  Diagnosis Date   Breast cancer (HCC) 2013   left breast   Cancer (HCC) 2013   L breast   Disorders of phosphorus metabolism    rickets   Fibroadenosis of breast    Gout 2013   Hypertension 2009   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 ER 90%, PR 1%, HER-2/neu not overexpressing.   Osteomalacia     Shingles 2013    Surgical History:  Past Surgical History:  Procedure Laterality Date   BREAST SURGERY Left 09/30/2011   Left simple mastectomy with sentinel node biopsy   CATARACT EXTRACTION, BILATERAL Bilateral 04/2015   Dr. Darel Hong in [place]   COLONOSCOPY  2012   DILATION AND CURETTAGE OF UTERUS  1982   MASTECTOMY Left 2013   with SN biopsy   TONSILLECTOMY  1953   UPPER GI ENDOSCOPY  2012    Medications:  Current Outpatient Medications on File Prior to Visit  Medication Sig   aspirin 81 MG chewable tablet Chew  81 mg by mouth daily.   Burosumab-twza (CRYSVITA) 30 MG/ML SOLN Inject 30 mg into the skin every 30 (thirty) days.   Cholecalciferol (VITAMIN D3) 50 MCG (2000 UT) TABS Take 2,000 Units by mouth daily.   Cyanocobalamin (VITAMIN B12 PO) Take 3,000 mcg by mouth daily.   Ferrous Sulfate (SLOW FE) 142 (45 Fe) MG TBCR Take 1 tablet by mouth daily. Take with a little orange juice.   fluticasone (FLONASE) 50 MCG/ACT nasal spray Place 2 sprays into both nostrils daily.   ibuprofen (ADVIL) 200 MG tablet Take 200 mg by mouth every 6 (six) hours as needed.   Nebivolol HCl 20 MG TABS TAKE 1 TABLET(20 MG) BY MOUTH DAILY   olmesartan-hydrochlorothiazide (BENICAR HCT) 40-12.5 MG tablet Take 1 tablet by mouth daily.   rosuvastatin (CRESTOR) 5 MG tablet TAKE 1 TABLET(5 MG) BY MOUTH DAILY   No current facility-administered medications on file prior to visit.    Allergies:  Allergies  Allergen Reactions   Allopurinol     diarrhea   Codeine Nausea And Vomiting   Other     Pain medications Altered mental status   Shellfish Allergy Diarrhea and Nausea And Vomiting    Just when eating clams Just when eating clams Just when eating clams     Social History:  Social History   Socioeconomic History   Marital status: Widowed    Spouse name: Sonny   Number of children: 1   Years of education: some college   Highest education level: 12th grade  Occupational History    Occupation: Retired  Tobacco Use   Smoking status: Former    Current packs/day: 0.00    Average packs/day: 0.5 packs/day for 30.0 years (15.0 ttl pk-yrs)    Types: Cigarettes    Start date: 04/21/1976    Quit date: 04/21/2006    Years since quitting: 16.5   Smokeless tobacco: Never  Vaping Use   Vaping status: Never Used  Substance and Sexual Activity   Alcohol use: Yes    Alcohol/week: 1.0 standard drink of alcohol    Types: 1 Glasses of wine per week   Drug use: No   Sexual activity: Not Currently  Other Topics Concern   Not on file  Social History Narrative   Not on file   Social Determinants of Health   Financial Resource Strain: Low Risk  (06/10/2022)   Overall Financial Resource Strain (CARDIA)    Difficulty of Paying Living Expenses: Not hard at all  Food Insecurity: No Food Insecurity (06/10/2022)   Hunger Vital Sign    Worried About Running Out of Food in the Last Year: Never true    Ran Out of Food in the Last Year: Never true  Transportation Needs: No Transportation Needs (06/10/2022)   PRAPARE - Administrator, Civil Service (Medical): No    Lack of Transportation (Non-Medical): No  Physical Activity: Insufficiently Active (06/10/2022)   Exercise Vital Sign    Days of Exercise per Week: 4 days    Minutes of Exercise per Session: 30 min  Stress: No Stress Concern Present (06/10/2022)   [employer] of Occupational Health - Occupational Stress Questionnaire    Feeling of Stress : Only a little  Social Connections: Socially Integrated (06/10/2022)   Social Connection and Isolation Panel [NHANES]    Frequency of Communication with Friends and Family: More than three times a week    Frequency of Social Gatherings with Friends and Family: More than three times a  week    Attends Religious Services: More than 4 times per year    Active Member of Clubs or Organizations: Yes    Attends Banker Meetings: Never    Marital Status: Married  Careers information officer Violence: Not At Risk (06/10/2022)   Humiliation, Afraid, Rape, and Kick questionnaire    Fear of Current or Ex-Partner: No    Emotionally Abused: No    Physically Abused: No    Sexually Abused: No   Social History   Tobacco Use  Smoking Status Former   Current packs/day: 0.00   Average packs/day: 0.5 packs/day for 30.0 years (15.0 ttl pk-yrs)   Types: Cigarettes   Start date: 04/21/1976   Quit date: 04/21/2006   Years since quitting: 16.5  Smokeless Tobacco Never   Social History   Substance and Sexual Activity  Alcohol Use Yes   Alcohol/week: 1.0 standard drink of alcohol   Types: 1 Glasses of wine per week    Family History:  Family History  Problem Relation Age of Onset   Alzheimer's disease Mother    Stroke Father    Lymphoma Brother 30       Hodgkin's Lymphoma   Cancer Brother        non hodgkins lymphoma    Heart disease Brother    Rickets Son    Cancer Maternal Uncle 23       cancer of the esophagus   Heart disease Maternal Grandfather    Breast cancer Neg Hx     Past medical history, surgical history, medications, allergies, family history and social history reviewed with patient today and changes made to appropriate areas of the chart.   ROS All other ROS negative except what is listed above and in the HPI.      Objective:    BP 130/78 (BP Location: Right Arm, Patient Position: Sitting, Cuff Size: Normal)   Pulse 65   Temp 98 F (36.7 C) (Oral)   Ht 4' 6.09" (1.374 m)   Wt 126 lb (57.2 kg)   SpO2 98%   BMI 30.27 kg/m   Wt Readings from Last 3 Encounters:  11/11/22 126 lb (57.2 kg)  06/10/22 119 lb 11.2 oz (54.3 kg)  05/13/22 120 lb 3.2 oz (54.5 kg)    Physical Exam Vitals and nursing note reviewed. Exam conducted with a chaperone present.  Constitutional:      General: She is awake. She is not in acute distress.    Appearance: She is well-developed and well-groomed. She is not ill-appearing or toxic-appearing.  HENT:     Head:  Normocephalic and atraumatic.     Right Ear: Hearing, tympanic membrane, ear canal and external ear normal. No drainage.     Left Ear: Hearing, tympanic membrane, ear canal and external ear normal. No drainage.     Nose: Nose normal.     Right Sinus: No maxillary sinus tenderness or frontal sinus tenderness.     Left Sinus: No maxillary sinus tenderness or frontal sinus tenderness.     Mouth/Throat:     Mouth: Mucous membranes are moist.     Pharynx: Oropharynx is clear. Uvula midline. No pharyngeal swelling, oropharyngeal exudate or posterior oropharyngeal erythema.  Eyes:     General: Lids are normal.        Right eye: No discharge.        Left eye: No discharge.     Extraocular Movements: Extraocular movements intact.     Conjunctiva/sclera: Conjunctivae normal.  Pupils: Pupils are equal, round, and reactive to light.     Visual Fields: Right eye visual fields normal and left eye visual fields normal.  Neck:     Thyroid: No thyromegaly.     Vascular: No carotid bruit.     Trachea: Trachea normal.  Cardiovascular:     Rate and Rhythm: Normal rate and regular rhythm.     Heart sounds: Normal heart sounds. No murmur heard.    No gallop.  Pulmonary:     Effort: Pulmonary effort is normal. No accessory muscle usage or respiratory distress.     Breath sounds: Normal breath sounds.  Chest:  Breasts:    Right: Normal.     Left: Normal.  Abdominal:     General: Bowel sounds are normal.     Palpations: Abdomen is soft. There is no hepatomegaly or splenomegaly.     Tenderness: There is no abdominal tenderness.  Musculoskeletal:        General: Normal range of motion.     Cervical back: Normal range of motion and neck supple.     Right lower leg: No edema.     Left lower leg: No edema.  Lymphadenopathy:     Head:     Right side of head: No submental, submandibular, tonsillar, preauricular or posterior auricular adenopathy.     Left side of head: No submental, submandibular,  tonsillar, preauricular or posterior auricular adenopathy.     Cervical: No cervical adenopathy.     Upper Body:     Right upper body: No supraclavicular, axillary or pectoral adenopathy.     Left upper body: No supraclavicular, axillary or pectoral adenopathy.  Skin:    General: Skin is warm and dry.     Capillary Refill: Capillary refill takes less than 2 seconds.     Findings: No rash.  Neurological:     Mental Status: She is alert and oriented to person, place, and time.     Gait: Gait is intact.     Deep Tendon Reflexes: Reflexes are normal and symmetric.     Reflex Scores:      Brachioradialis reflexes are 2+ on the right side and 2+ on the left side.      Patellar reflexes are 2+ on the right side and 2+ on the left side. Psychiatric:        Attention and Perception: Attention normal.        Mood and Affect: Mood normal.        Speech: Speech normal.        Behavior: Behavior normal. Behavior is cooperative.        Thought Content: Thought content normal.        Judgment: Judgment normal.       06/10/2022    2:11 PM 11/16/2018    1:52 PM 11/10/2017    1:06 PM  6CIT Screen  What Year? 0 points 0 points 0 points  What month? 0 points 0 points 0 points  What time? 0 points 0 points 0 points  Count back from 20 0 points 0 points 0 points  Months in reverse 0 points 0 points 0 points  Repeat phrase 0 points 0 points 0 points  Total Score 0 points 0 points 0 points   Results for orders placed or performed in visit on 05/13/22  CBC with Differential/Platelet  Result Value Ref Range   WBC 8.5 3.4 - 10.8 x10E3/uL   RBC 4.98 3.77 - 5.28 x10E6/uL  Hemoglobin 14.9 11.1 - 15.9 g/dL   Hematocrit 16.1 09.6 - 46.6 %   MCV 92 79 - 97 fL   MCH 29.9 26.6 - 33.0 pg   MCHC 32.6 31.5 - 35.7 g/dL   RDW 04.5 40.9 - 81.1 %   Platelets 217 150 - 450 x10E3/uL   Neutrophils 67 Not Estab. %   Lymphs 21 Not Estab. %   Monocytes 6 Not Estab. %   Eos 5 Not Estab. %   Basos 1 Not Estab. %    Neutrophils Absolute 5.7 1.4 - 7.0 x10E3/uL   Lymphocytes Absolute 1.8 0.7 - 3.1 x10E3/uL   Monocytes Absolute 0.5 0.1 - 0.9 x10E3/uL   EOS (ABSOLUTE) 0.4 0.0 - 0.4 x10E3/uL   Basophils Absolute 0.1 0.0 - 0.2 x10E3/uL   Immature Granulocytes 0 Not Estab. %   Immature Grans (Abs) 0.0 0.0 - 0.1 x10E3/uL  Comprehensive metabolic panel  Result Value Ref Range   Glucose 104 (H) 70 - 99 mg/dL   BUN 17 8 - 27 mg/dL   Creatinine, Ser 9.14 0.57 - 1.00 mg/dL   eGFR 67 >78 GN/FAO/1.30   BUN/Creatinine Ratio 19 12 - 28   Sodium 139 134 - 144 mmol/L   Potassium 4.5 3.5 - 5.2 mmol/L   Chloride 102 96 - 106 mmol/L   CO2 21 20 - 29 mmol/L   Calcium 9.1 8.7 - 10.3 mg/dL   Total Protein 7.0 6.0 - 8.5 g/dL   Albumin 4.5 3.8 - 4.8 g/dL   Globulin, Total 2.5 1.5 - 4.5 g/dL   Albumin/Globulin Ratio 1.8 1.2 - 2.2   Bilirubin Total 0.3 0.0 - 1.2 mg/dL   Alkaline Phosphatase 171 (H) 44 - 121 IU/L   AST 23 0 - 40 IU/L   ALT 13 0 - 32 IU/L  Lipid Panel w/o Chol/HDL Ratio  Result Value Ref Range   Cholesterol, Total 173 100 - 199 mg/dL   Triglycerides 865 (H) 0 - 149 mg/dL   HDL 64 >78 mg/dL   VLDL Cholesterol Cal 53 (H) 5 - 40 mg/dL   LDL Chol Calc (NIH) 56 0 - 99 mg/dL  Iron Binding Cap (TIBC)([facility])  Result Value Ref Range   Total Iron Binding Capacity 440 250 - 450 ug/dL   UIBC 469 (H) 629 - 528 ug/dL   Iron 65 27 - 413 ug/dL   Iron Saturation 15 15 - 55 %  Ferritin  Result Value Ref Range   Ferritin 26 15 - 150 ng/mL  Vitamin B12  Result Value Ref Range   Vitamin B-12 595 232 - 1,245 pg/mL  Microalbumin, Urine Waived  Result Value Ref Range   Microalb, Ur Waived 30 (H) 0 - 19 mg/L   Creatinine, Urine Waived 50 10 - 300 mg/dL   Microalb/Creat Ratio 30-300 (H) <30 mg/g  Uric acid  Result Value Ref Range   Uric Acid 7.5 3.1 - 7.9 mg/dL  Hepatitis C antibody  Result Value Ref Range   Hep C Virus Ab Non Reactive Non Reactive      Assessment & Plan:   Problem List Items  Addressed This Visit       Cardiovascular and Mediastinum   Essential hypertension    Chronic, stable.  BP at goal for age at home and on check today.  Recommend she monitor BP at least a few mornings a week at home and document.  DASH diet at home.  Continue current medication regimen and adjust as needed.  Labs:  CBC, CMP, TSH.  Return in 6 months.       Relevant Orders   CBC with Differential/Platelet   Comprehensive metabolic panel   TSH     Musculoskeletal and Integument   Osteoporosis    Chronic, ongoing.  Followed at [facility] for rickets and obtains injections (Crysvita).  Due for DEXA, she is scheduled for this upcoming and will review upon completion, will reach out to endo once returns to see if need for bisphosphonate with patient.      Relevant Orders   VITAMIN D 25 Hydroxy (Vit-D Deficiency, Fractures)   X-linked hypophosphatemic rickets    Followed by endocrinology at [facility] and receives monthly injections, continue this collaboration.  Recent labs reviewed and notes.  Continue Vit D supplement as recommended by them.       Relevant Orders   Comprehensive metabolic panel   VITAMIN D 25 Hydroxy (Vit-D Deficiency, Fractures)     Genitourinary   CKD (chronic kidney disease) stage 3, GFR 30-59 ml/min (HCC) - Primary    Ongoing, stable.  Fluctuates.  Recent labs were normal.  Recommend good hydration at home and cutting back on Advil use when possible.  Recheck today.  Refer to nephrology as needed.      Relevant Orders   Comprehensive metabolic panel     Other   Anemia    Stable on recent labs.  Has cut back on Advil.  Recheck CBC, iron/ferritin today.  Discussed at length with her.  She is asymptomatic at this time.  ?related to kidneys vs GI or rickets.  Consider hematology or GI referral if worsening.      Relevant Orders   CBC with Differential/Platelet   Iron Binding Cap (TIBC)([facility])   Ferritin   B12 deficiency    Followed by [facility] endocrinology, recent  lab stable -- recheck today and continue supplement.      Relevant Orders   Vitamin B12   Controlled gout    History of, no current medications.  Check labs today.      Relevant Orders   Uric acid   Grief reaction    Chronic, ongoing.  Some exacerbation related to illness in family and moving.  Will increase Sertraline to 75 MG daily and adjust further as needed.  She is aware she can return to 50 MG in future if stressors improve.  Continue to monitor closely.  Denies SI/HI.  To reach out to provider if any concerns with increase.      History of breast cancer in female    In 2013, she obtains annual mammograms, continue this.      Relevant Orders   MM 3D SCREENING MAMMOGRAM BILATERAL BREAST   Mixed hyperlipidemia    Chronic, ongoing.  Continue current medication regimen and adjust as needed.  Obtain lab today.      Relevant Orders   Comprehensive metabolic panel   Lipid Panel w/o Chol/HDL Ratio   Vitamin D deficiency    With underlying rickets, followed by [facility] endocrinology, continue supplement and current collaboration.      Relevant Orders   VITAMIN D 25 Hydroxy (Vit-D Deficiency, Fractures)   Other Visit Diagnoses     Encounter for annual physical exam       Annual physical today with labs and health maintenance reviewed, discussed with patient.        Follow up plan: Return in about 6 months (around 05/14/2023).   LABORATORY TESTING:  - Pap smear: not applicable  IMMUNIZATIONS:   -  Tdap: Tetanus vaccination status reviewed: Td vaccination indicated and given today. - Influenza: Refused - Pneumovax: Up to date - Prevnar: Up to date - COVID: Up to date - HPV: Not applicable - Shingrix vaccine: Refused  SCREENING: -Mammogram: Up to date -- scheduled upcoming, history of breast cancer - Colonoscopy: Up to date  - Bone Density: Scheduled upcoming on 11/18/22 -Hearing Test: Not applicable  -Spirometry: Not applicable   PATIENT COUNSELING:   Advised to  take 1 mg of folate supplement per day if capable of pregnancy.   Sexuality: Discussed sexually transmitted diseases, partner selection, use of condoms, avoidance of unintended pregnancy  and contraceptive alternatives.   Advised to avoid cigarette smoking.  I discussed with the patient that most people either abstain from alcohol or drink within safe limits (<=14/week and <=4 drinks/occasion for males, <=7/weeks and <= 3 drinks/occasion for females) and that the risk for alcohol disorders and other health effects rises proportionally with the number of drinks per week and how often a drinker exceeds daily limits.  Discussed cessation/primary prevention of drug use and availability of treatment for abuse.   Diet: Encouraged to adjust caloric intake to maintain  or achieve ideal body weight, to reduce intake of dietary saturated fat and total fat, to limit sodium intake by avoiding high sodium foods and not adding table salt, and to maintain adequate dietary potassium and calcium preferably from fresh fruits, vegetables, and low-fat dairy products.    Stressed the importance of regular exercise  Injury prevention: Discussed safety belts, safety helmets, smoke detector, smoking near bedding or upholstery.   Dental health: Discussed importance of regular tooth brushing, flossing, and dental visits.    NEXT PREVENTATIVE PHYSICAL DUE IN 1 YEAR. Return in about 6 months (around 05/14/2023).

## 2022-11-11 NOTE — Assessment & Plan Note (Signed)
Chronic, ongoing.  Some exacerbation related to illness in family and moving.  Will increase Sertraline to 75 MG daily and adjust further as needed.  She is aware she can return to 50 MG in future if stressors improve.  Continue to monitor closely.  Denies SI/HI.  To reach out to provider if any concerns with increase.

## 2022-11-11 NOTE — Assessment & Plan Note (Signed)
Ongoing, stable.  Fluctuates.  Recent labs were normal.  Recommend good hydration at home and cutting back on Advil use when possible.  Recheck today.  Refer to nephrology as needed.

## 2022-11-11 NOTE — Assessment & Plan Note (Signed)
In 2013, she obtains annual mammograms, continue this.

## 2022-11-11 NOTE — Assessment & Plan Note (Signed)
Stable on recent labs.  Has cut back on Advil.  Recheck CBC, iron/ferritin today.  Discussed at length with her.  She is asymptomatic at this time.  ?related to kidneys vs GI or rickets.  Consider hematology or GI referral if worsening.

## 2022-11-12 LAB — CBC WITH DIFFERENTIAL/PLATELET
Basophils Absolute: 0.1 10*3/uL (ref 0.0–0.2)
Basos: 1 %
EOS (ABSOLUTE): 0.4 10*3/uL (ref 0.0–0.4)
Eos: 4 %
Hematocrit: 40.3 % (ref 34.0–46.6)
Hemoglobin: 13.3 g/dL (ref 11.1–15.9)
Immature Grans (Abs): 0 10*3/uL (ref 0.0–0.1)
Immature Granulocytes: 0 %
Lymphocytes Absolute: 1.7 10*3/uL (ref 0.7–3.1)
Lymphs: 20 %
MCH: 30.5 pg (ref 26.6–33.0)
MCHC: 33 g/dL (ref 31.5–35.7)
MCV: 92 fL (ref 79–97)
Monocytes Absolute: 0.4 10*3/uL (ref 0.1–0.9)
Monocytes: 5 %
Neutrophils Absolute: 5.8 10*3/uL (ref 1.4–7.0)
Neutrophils: 70 %
Platelets: 226 10*3/uL (ref 150–450)
RBC: 4.36 x10E6/uL (ref 3.77–5.28)
RDW: 12.8 % (ref 11.7–15.4)
WBC: 8.3 10*3/uL (ref 3.4–10.8)

## 2022-11-12 LAB — COMPREHENSIVE METABOLIC PANEL
ALT: 14 IU/L (ref 0–32)
AST: 22 IU/L (ref 0–40)
Albumin: 4.5 g/dL (ref 3.8–4.8)
Alkaline Phosphatase: 167 IU/L — ABNORMAL HIGH (ref 44–121)
BUN/Creatinine Ratio: 17 (ref 12–28)
BUN: 18 mg/dL (ref 8–27)
Bilirubin Total: 0.5 mg/dL (ref 0.0–1.2)
CO2: 22 mmol/L (ref 20–29)
Calcium: 9.6 mg/dL (ref 8.7–10.3)
Chloride: 100 mmol/L (ref 96–106)
Creatinine, Ser: 1.07 mg/dL — ABNORMAL HIGH (ref 0.57–1.00)
Globulin, Total: 2.9 g/dL (ref 1.5–4.5)
Glucose: 81 mg/dL (ref 70–99)
Potassium: 4.8 mmol/L (ref 3.5–5.2)
Sodium: 138 mmol/L (ref 134–144)
Total Protein: 7.4 g/dL (ref 6.0–8.5)
eGFR: 54 mL/min/{1.73_m2} — ABNORMAL LOW (ref >59)

## 2022-11-12 LAB — LIPID PANEL W/O CHOL/HDL RATIO
Cholesterol, Total: 160 mg/dL (ref 100–199)
HDL: 66 mg/dL (ref >39)
LDL Chol Calc (NIH): 60 mg/dL (ref 0–99)
Triglycerides: 215 mg/dL — ABNORMAL HIGH (ref 0–149)
VLDL Cholesterol Cal: 34 mg/dL (ref 5–40)

## 2022-11-12 LAB — TSH: TSH: 1.15 u[IU]/mL (ref 0.450–4.500)

## 2022-11-12 LAB — IRON AND TIBC
Iron Saturation: 28 % (ref 15–55)
Iron: 123 ug/dL (ref 27–139)
Total Iron Binding Capacity: 443 ug/dL (ref 250–450)
UIBC: 320 ug/dL (ref 118–369)

## 2022-11-12 LAB — FERRITIN: Ferritin: 44 ng/mL (ref 15–150)

## 2022-11-12 LAB — VITAMIN B12: Vitamin B-12: 547 pg/mL (ref 232–1245)

## 2022-11-12 LAB — URIC ACID: Uric Acid: 9.5 mg/dL — ABNORMAL HIGH (ref 3.1–7.9)

## 2022-11-12 LAB — VITAMIN D 25 HYDROXY (VIT D DEFICIENCY, FRACTURES): Vit D, 25-Hydroxy: 32.6 ng/mL (ref 30.0–100.0)

## 2022-11-12 NOTE — Progress Notes (Signed)
Contacted via MyChart   Good morning Felicia Robinson, as always a pleasure to see you.  Your labs have returned: - Cholesterol labs show stable LDL, triglycerides a little elevated.  Continue Rosuvastatin at current dose and we will adjust as needed. - Uric acid was elevated this check, we will watch this closely with your gout history and definitely alert endo when you see them.   - Kidney function, creatinine and eGFR, has some mild decline again this check -- your levels do tend to fluctuate and we will monitor. - Remainder of labs look great, continue all current medications.  Any questions? Keep being wonderful!!  Thank you for allowing me to participate in your care.  I appreciate you. Kindest regards, Torah Pinnock

## 2022-11-18 ENCOUNTER — Other Ambulatory Visit: Payer: Medicare PPO

## 2022-12-24 ENCOUNTER — Telehealth: Payer: Self-pay

## 2022-12-24 NOTE — Telephone Encounter (Signed)
Received surgical clearance form for the patient. Please call and schedule surgery clearance appointment. Surgery date is TBD. Form placed in the incomplete bin until the appointment.

## 2022-12-24 NOTE — Telephone Encounter (Signed)
Attempted to reach patient, LVM to call office back to schedule appointment for surgical clearance.  Put in CRM.

## 2022-12-25 NOTE — Telephone Encounter (Signed)
Attempted to reach patient, LVM detail message to call office back.

## 2023-01-13 ENCOUNTER — Telehealth: Payer: Self-pay | Admitting: Nurse Practitioner

## 2023-01-13 NOTE — Telephone Encounter (Signed)
Lillia Abed with [facility] is calling in to check on the status of a surgical clearance form that was sent over to be filled out. Lillia Abed says she is going to reach out to the pt as well. If there are any questions please reach out to Ai [phone]

## 2023-01-14 NOTE — Telephone Encounter (Signed)
Please try reaching out to the patient to schedule her surgery clearance appointment. The form is in the incomplete bin.

## 2023-01-14 NOTE — Telephone Encounter (Signed)
Appointment has been made.

## 2023-01-15 ENCOUNTER — Encounter: Payer: Self-pay | Admitting: Pediatrics

## 2023-01-15 ENCOUNTER — Ambulatory Visit: Payer: Medicare PPO | Admitting: Pediatrics

## 2023-01-15 VITALS — BP 137/74 | HR 66 | Temp 98.7°F | Wt 127.4 lb

## 2023-01-15 DIAGNOSIS — Z23 Encounter for immunization: Secondary | ICD-10-CM | POA: Diagnosis not present

## 2023-01-15 DIAGNOSIS — Z01818 Encounter for other preprocedural examination: Secondary | ICD-10-CM | POA: Diagnosis not present

## 2023-01-15 NOTE — Patient Instructions (Addendum)
Great to meet you!  Our office will fax the form to your surgeon once we have the results of your labs from today.  Ask your surgeon about the aspirin.  The day of the procedure I recommend holding the olmesartan/benicar

## 2023-01-15 NOTE — Progress Notes (Signed)
Acute Visit  BP 137/74   Pulse 66   Temp 98.7 F (37.1 C) (Oral)   Wt 127 lb 6.4 oz (57.8 kg)   SpO2 97%   BMI 30.61 kg/m    Subjective:    Patient ID: Felicia Robinson, female    DOB: [birth year], 77 y.o.   MRN: [id number]  HPI: Felicia Robinson is a 77 y.o. female  Chief Complaint  Patient presents with   Surgical Clearance   Has upcoming tooth extractions She is getting general anesthesia  S/p mastectomy, has had this before No chest pain, sob, palpations, orthopnea, dyspnea, PND, lower extremity edema.  Relevant past medical, surgical, family and social history reviewed and updated as indicated. Interim medical history since our last visit reviewed. Allergies and medications reviewed and updated.  ROS per HPI unless specifically indicated above     Objective:    BP 137/74   Pulse 66   Temp 98.7 F (37.1 C) (Oral)   Wt 127 lb 6.4 oz (57.8 kg)   SpO2 97%   BMI 30.61 kg/m   Wt Readings from Last 3 Encounters:  01/15/23 127 lb 6.4 oz (57.8 kg)  11/11/22 126 lb (57.2 kg)  06/10/22 119 lb 11.2 oz (54.3 kg)     Physical Exam Constitutional:      Appearance: Normal appearance.  HENT:     Head: Normocephalic and atraumatic.  Eyes:     Pupils: Pupils are equal, round, and reactive to light.  Cardiovascular:     Rate and Rhythm: Normal rate and regular rhythm.     Pulses: Normal pulses.     Heart sounds: Normal heart sounds.  Pulmonary:     Effort: Pulmonary effort is normal.     Breath sounds: Normal breath sounds.  Abdominal:     General: Abdomen is flat.     Palpations: Abdomen is soft.  Musculoskeletal:        General: Normal range of motion.     Cervical back: Normal range of motion.  Skin:    General: Skin is warm and dry.     Capillary Refill: Capillary refill takes less than 2 seconds.  Neurological:     General: No focal deficit present.     Mental Status: She is alert and oriented to person, place, and time. Mental status is at  baseline.  Psychiatric:        Mood and Affect: Mood normal.        Behavior: Behavior normal.   EKG: NORMAL SINUS RHYTHM      Assessment & Plan:  Assessment & Plan   Pre-op evaluation RCRI score 0 points, low risk for procedural complications. Exam reassuring today. Patient is optimized for upcoming procedure from assessment today. Labs below pending. Recommend she hold BP medication day of procedure. Defer ASA continuation to surgeon. Checking BMP given slight CR elevation last visit. -     EKG 12-Lead -     CBC -     Basic metabolic panel  Need for influenza vaccination -     Flu Vaccine Trivalent High Dose (Fluad)  Follow up plan: Return if symptoms worsen or fail to improve.  Felicia Stuller Howell Pringle, MD

## 2023-01-16 LAB — CBC
Hematocrit: 43.6 % (ref 34.0–46.6)
Hemoglobin: 13.5 g/dL (ref 11.1–15.9)
MCH: 29.4 pg (ref 26.6–33.0)
MCHC: 31 g/dL — ABNORMAL LOW (ref 31.5–35.7)
MCV: 95 fL (ref 79–97)
Platelets: 218 10*3/uL (ref 150–450)
RBC: 4.59 x10E6/uL (ref 3.77–5.28)
RDW: 12.8 % (ref 11.7–15.4)
WBC: 8.2 10*3/uL (ref 3.4–10.8)

## 2023-01-16 LAB — BASIC METABOLIC PANEL
BUN/Creatinine Ratio: 20 (ref 12–28)
BUN: 21 mg/dL (ref 8–27)
CO2: 23 mmol/L (ref 20–29)
Calcium: 9.7 mg/dL (ref 8.7–10.3)
Chloride: 101 mmol/L (ref 96–106)
Creatinine, Ser: 1.07 mg/dL — ABNORMAL HIGH (ref 0.57–1.00)
Glucose: 90 mg/dL (ref 70–99)
Potassium: 4.5 mmol/L (ref 3.5–5.2)
Sodium: 138 mmol/L (ref 134–144)
eGFR: 54 mL/min/{1.73_m2} — ABNORMAL LOW (ref >59)

## 2023-01-21 ENCOUNTER — Ambulatory Visit: Payer: Medicare PPO | Admitting: Nurse Practitioner

## 2023-02-02 ENCOUNTER — Other Ambulatory Visit: Payer: Medicare PPO

## 2023-03-03 ENCOUNTER — Ambulatory Visit
Admission: RE | Admit: 2023-03-03 | Discharge: 2023-03-03 | Disposition: A | Discharge status: Home / Self Care | Payer: Medicare PPO | Source: Ambulatory Visit | Attending: Nurse Practitioner | Admitting: Nurse Practitioner

## 2023-03-03 DIAGNOSIS — M9080 Osteopathy in diseases classified elsewhere, unspecified site: Secondary | ICD-10-CM | POA: Diagnosis present

## 2023-03-03 DIAGNOSIS — M810 Age-related osteoporosis without current pathological fracture: Secondary | ICD-10-CM | POA: Diagnosis present

## 2023-03-03 NOTE — Progress Notes (Signed)
Contacted via MyChart   Good evening Felicia Robinson, your bone density has returned and shows same level as 2014.  Still osteoporosis.  I do recommend you talk to your endocrinologist as discussed to see which treatment they would like to start for you:)  Any questions? Keep being amazing!!  Thank you for allowing me to participate in your care.  I appreciate you. Kindest regards, Lynze Reddy

## 2023-03-25 HISTORY — PX: DENTAL SURGERY: SHX609

## 2023-05-10 NOTE — Patient Instructions (Incomplete)
Be Involved in Caring For Your Health:  Taking Medications When medications are taken as directed, they can greatly improve your health. But if they are not taken as prescribed, they may not work. In some cases, not taking them correctly can be harmful. To help ensure your treatment remains effective and safe, understand your medications and how to take them. Bring your medications to each visit for review by your provider.  Your lab results, notes, and after visit summary will be available on My Chart. We strongly encourage you to use this feature. If lab results are abnormal the clinic will contact you with the appropriate steps. If the clinic does not contact you assume the results are satisfactory. You can always view your results on My Chart. If you have questions regarding your health or results, please contact the clinic during office hours. You can also ask questions on My Chart.  We at [facility] are grateful that you chose us to provide your care. We strive to provide evidence-based and compassionate care and are always looking for feedback. If you get a survey from the clinic please complete this so we can hear your opinions.  Managing Anxiety, Adult After being diagnosed with anxiety, you may be relieved to know why you have felt or behaved a certain way. You may also feel overwhelmed about the treatment ahead and what it will mean for your life. With care and support, you can manage your anxiety. How to manage lifestyle changes Understanding the difference between stress and anxiety Although stress can play a role in anxiety, it is not the same as anxiety. Stress is your body's reaction to life changes and events, both good and bad. Stress is often caused by something external, such as a deadline, test, or competition. It normally goes away after the event has ended and will last just a few hours. But, stress can be ongoing and can lead to more than just stress. Anxiety is  caused by something internal, such as imagining a terrible outcome or worrying that something will go wrong that will greatly upset you. Anxiety often does not go away even after the event is over, and it can become a long-term (chronic) worry. Lowering stress and anxiety Talk with your health care provider or a counselor to learn more about lowering anxiety and stress. They may suggest tension-reduction techniques, such as: Music. Spend time creating or listening to music that you enjoy and that inspires you. Mindfulness-based meditation. Practice being aware of your normal breaths while not trying to control your breathing. It can be done while sitting or walking. Centering prayer. Focus on a word, phrase, or sacred image that means something to you and brings you peace. Deep breathing. Expand your stomach and inhale slowly through your nose. Hold your breath for 3-5 seconds. Then breathe out slowly, letting your stomach muscles relax. Self-talk. Learn to notice and spot thought patterns that lead to anxiety reactions. Change those patterns to thoughts that feel peaceful. Muscle relaxation. Take time to tense muscles and then relax them. Choose a tension-reduction technique that fits your lifestyle and personality. These techniques take time and practice. Set aside 5-15 minutes a day to do them. Specialized therapists can offer counseling and training in these techniques. The training to help with anxiety may be covered by some insurance plans. Other things you can do to manage stress and anxiety include: Keeping a stress diary. This can help you learn what triggers your reaction and then learn ways   to manage your response. Thinking about how you react to certain situations. You may not be able to control everything, but you can control your response. Making time for activities that help you relax and not feeling guilty about spending your time in this way. Doing visual imagery. This involves  imagining or creating mental pictures to help you relax. Practicing yoga. Through yoga poses, you can lower tension and relax.  Medicines Medicines for anxiety include: Antidepressant medicines. These are usually prescribed for long-term daily control. Anti-anxiety medicines. These may be added in severe cases, especially when panic attacks occur. When used together, medicines, psychotherapy, and tension-reduction techniques may be the most effective treatment. Relationships Relationships can play a big part in helping you recover. Spend more time connecting with trusted friends and family members. Think about going to couples counseling if you have a partner, taking family education classes, or going to family therapy. Therapy can help you and others better understand your anxiety. How to recognize changes in your anxiety Everyone responds differently to treatment for anxiety. Recovery from anxiety happens when symptoms lessen and stop interfering with your daily life at home or work. This may mean that you will start to: Have better concentration and focus. Worry will interfere less in your daily thinking. Sleep better. Be less irritable. Have more energy. Have improved memory. Try to recognize when your condition is getting worse. Contact your provider if your symptoms interfere with home or work and you feel like your condition is not improving. Follow these instructions at home: Activity Exercise. Adults should: Exercise for at least 150 minutes each week. The exercise should increase your heart rate and make you sweat (moderate-intensity exercise). Do strengthening exercises at least twice a week. Get the right amount and quality of sleep. Most adults need 7-9 hours of sleep each night. Lifestyle  Eat a healthy diet that includes plenty of vegetables, fruits, whole grains, low-fat dairy products, and lean protein. Do not eat a lot of foods that are high in fats, added sugars, or salt  (sodium). Make choices that simplify your life. Do not use any products that contain nicotine or tobacco. These products include cigarettes, chewing tobacco, and vaping devices, such as e-cigarettes. If you need help quitting, ask your provider. Avoid caffeine, alcohol, and certain over-the-counter cold medicines. These may make you feel worse. Ask your pharmacist which medicines to avoid. General instructions Take over-the-counter and prescription medicines only as told by your provider. Keep all follow-up visits. This is to make sure you are managing your anxiety well or if you need more support. Where to find support You can get help and support from: Self-help groups. Online and community organizations. A trusted spiritual leader. Couples counseling. Family education classes. Family therapy. Where to find more information You may find that joining a support group helps you deal with your anxiety. The following sources can help you find counselors or support groups near you: Mental Health America: mentalhealthamerica.net Anxiety and Depression Association of America (ADAA): adaa.org National Alliance on Mental Illness (NAMI): nami.org Contact a health care provider if: You have a hard time staying focused or finishing tasks. You spend many hours a day feeling worried about everyday life. You are very tired because you cannot stop worrying. You start to have headaches or often feel tense. You have chronic nausea or diarrhea. Get help right away if: Your heart feels like it is racing. You have shortness of breath. You have thoughts of hurting yourself or others. Get help   right away if you feel like you may hurt yourself or others, or have thoughts about taking your own life. Go to your nearest emergency room or: Call 911. Call the National Suicide Prevention Lifeline at 1-800-273-8255 or 988. This is open 24 hours a day. Text the Crisis Text Line at 741741. This information is not  intended to replace advice given to you by your health care provider. Make sure you discuss any questions you have with your health care provider. Document Revised: 01/14/2022 Document Reviewed: 07/29/2020 Elsevier Patient Education  2024 Elsevier Inc.  

## 2023-05-14 ENCOUNTER — Ambulatory Visit: Payer: Medicare PPO | Admitting: Nurse Practitioner

## 2023-05-14 DIAGNOSIS — I10 Essential (primary) hypertension: Secondary | ICD-10-CM

## 2023-05-14 DIAGNOSIS — F4321 Adjustment disorder with depressed mood: Secondary | ICD-10-CM

## 2023-05-14 DIAGNOSIS — N1831 Chronic kidney disease, stage 3a: Secondary | ICD-10-CM

## 2023-05-14 DIAGNOSIS — M810 Age-related osteoporosis without current pathological fracture: Secondary | ICD-10-CM

## 2023-05-14 DIAGNOSIS — E782 Mixed hyperlipidemia: Secondary | ICD-10-CM

## 2023-05-24 NOTE — Patient Instructions (Signed)
 Be Involved in Caring For Your Health:  Taking Medications When medications are taken as directed, they can greatly improve your health. But if they are not taken as prescribed, they may not work. In some cases, not taking them correctly can be harmful. To help ensure your treatment remains effective and safe, understand your medications and how to take them. Bring your medications to each visit for review by your provider.  Your lab results, notes, and after visit summary will be available on My Chart. We strongly encourage you to use this feature. If lab results are abnormal the clinic will contact you with the appropriate steps. If the clinic does not contact you assume the results are satisfactory. You can always view your results on My Chart. If you have questions regarding your health or results, please contact the clinic during office hours. You can also ask questions on My Chart.  We at [facility] are grateful that you chose [place] to provide your care. We strive to provide evidence-based and compassionate care and are always looking for feedback. If you get a survey from the clinic please complete this so we can hear your opinions.  Healthy Eating, Adult Healthy eating may help you get and keep a healthy body weight, reduce the risk of chronic disease, and live a long and productive life. It is important to follow a healthy eating pattern. Your nutritional and calorie needs should be met mainly by different nutrient-rich foods. What are tips for following this plan? Reading food labels Read labels and choose the following: Reduced or low sodium products. Juices with 100% fruit juice. Foods with low saturated fats (<3 g per serving) and high polyunsaturated and monounsaturated fats. Foods with whole grains, such as whole wheat, cracked wheat, brown rice, and wild rice. Whole grains that are fortified with folic acid. This is recommended for females who are pregnant or who want  to become pregnant. Read labels and do not eat or drink the following: Foods or drinks with added sugars. These include foods that contain brown sugar, corn sweetener, corn syrup, dextrose, fructose, glucose, high-fructose corn syrup, honey, invert sugar, lactose, malt syrup, maltose, molasses, raw sugar, sucrose, trehalose, or turbinado sugar. Limit your intake of added sugars to less than 10% of your total daily calories. Do not eat more than the following amounts of added sugar per day: 6 teaspoons (25 g) for females. 9 teaspoons (38 g) for males. Foods that contain processed or refined starches and grains. Refined grain products, such as white flour, degermed cornmeal, white bread, and white rice. Shopping Choose nutrient-rich snacks, such as vegetables, whole fruits, and nuts. Avoid high-calorie and high-sugar snacks, such as potato chips, fruit snacks, and candy. Use oil-based dressings and spreads on foods instead of solid fats such as butter, margarine, sour cream, or cream cheese. Limit pre-made sauces, mixes, and "instant" products such as flavored rice, instant noodles, and ready-made pasta. Try more plant-protein sources, such as tofu, tempeh, black beans, edamame, lentils, nuts, and seeds. Explore eating plans such as the Mediterranean diet or vegetarian diet. Try heart-healthy dips made with beans and healthy fats like hummus and guacamole. Vegetables go great with these. Cooking Use oil to saut or stir-fry foods instead of solid fats such as butter, margarine, or lard. Try baking, boiling, grilling, or broiling instead of frying. Remove the fatty part of meats before cooking. Steam vegetables in water or broth. Meal planning  At meals, imagine dividing your plate into fourths: One-half of  your plate is fruits and vegetables. One-fourth of your plate is whole grains. One-fourth of your plate is protein, especially lean meats, poultry, eggs, tofu, beans, or nuts. Include  low-fat dairy as part of your daily diet. Lifestyle Choose healthy options in all settings, including home, work, school, restaurants, or stores. Prepare your food safely: Wash your hands after handling raw meats. Where you prepare food, keep surfaces clean by regularly washing with hot, soapy water. Keep raw meats separate from ready-to-eat foods, such as fruits and vegetables. Cook seafood, meat, poultry, and eggs to the recommended temperature. Get a food thermometer. Store foods at safe temperatures. In general: Keep cold foods at 76F (4.4C) or below. Keep hot foods at 176F (60C) or above. Keep your freezer at [facility] (-17.8C) or below. Foods are not safe to eat if they have been between the temperatures of 40-176F (4.4-60C) for more than 2 hours. What foods should I eat? Fruits Aim to eat 1-2 cups of fresh, canned (in natural juice), or frozen fruits each day. One cup of fruit equals 1 small apple, 1 large banana, 8 large strawberries, 1 cup (237 g) canned fruit,  cup (82 g) dried fruit, or 1 cup (240 mL) 100% juice. Vegetables Aim to eat 2-4 cups of fresh and frozen vegetables each day, including different varieties and colors. One cup of vegetables equals 1 cup (91 g) broccoli or cauliflower florets, 2 medium carrots, 2 cups (150 g) raw, leafy greens, 1 large tomato, 1 large bell pepper, 1 large sweet potato, or 1 medium white potato. Grains Aim to eat 5-10 ounce-equivalents of whole grains each day. Examples of 1 ounce-equivalent of grains include 1 slice of bread, 1 cup (40 g) ready-to-eat cereal, 3 cups (24 g) popcorn, or  cup (93 g) cooked rice. Meats and other proteins Try to eat 5-7 ounce-equivalents of protein each day. Examples of 1 ounce-equivalent of protein include 1 egg,  oz nuts (12 almonds, 24 pistachios, or 7 walnut halves), 1/4 cup (90 g) cooked beans, 6 tablespoons (90 g) hummus or 1 tablespoon (16 g) peanut butter. A cut of meat or fish that is the size of a deck  of cards is about 3-4 ounce-equivalents (85 g). Of the protein you eat each week, try to have at least 8 sounce (227 g) of seafood. This is about 2 servings per week. This includes salmon, trout, herring, sardines, and anchovies. Dairy Aim to eat 3 cup-equivalents of fat-free or low-fat dairy each day. Examples of 1 cup-equivalent of dairy include 1 cup (240 mL) milk, 8 ounces (250 g) yogurt, 1 ounces (44 g) natural cheese, or 1 cup (240 mL) fortified soy milk. Fats and oils Aim for about 5 teaspoons (21 g) of fats and oils per day. Choose monounsaturated fats, such as canola and olive oils, mayonnaise made with olive oil or avocado oil, avocados, peanut butter, and most nuts, or polyunsaturated fats, such as sunflower, corn, and soybean oils, walnuts, pine nuts, sesame seeds, sunflower seeds, and flaxseed. Beverages Aim for 6 eight-ounce glasses of water per day. Limit coffee to 3-5 eight-ounce cups per day. Limit caffeinated beverages that have added calories, such as soda and energy drinks. If you drink alcohol: Limit how much you have to: 0-1 drink a day if you are female. 0-2 drinks a day if you are female. Know how much alcohol is in your drink. In the [place] one drink is one 12 oz bottle of beer (355 mL), one 5 oz glass of wine (  148 mL), or one 1 oz glass of hard liquor (44 mL). Seasoning and other foods Try not to add too much salt to your food. Try using herbs and spices instead of salt. Try not to add sugar to food. This information is based on [place] nutrition guidelines. To learn more, visit DisposableNylon.be. Exact amounts may vary. You may need different amounts. This information is not intended to replace advice given to you by your health care provider. Make sure you discuss any questions you have with your health care provider. Document Revised: 01/06/2022 Document Reviewed: 01/06/2022 Elsevier Patient Education  2024 [employer].

## 2023-05-25 ENCOUNTER — Ambulatory Visit: Payer: Self-pay | Admitting: Nurse Practitioner

## 2023-05-25 ENCOUNTER — Encounter: Payer: Self-pay | Admitting: Nurse Practitioner

## 2023-05-25 VITALS — BP 131/75 | HR 61 | Temp 98.3°F | Ht <= 58 in | Wt 123.0 lb

## 2023-05-25 DIAGNOSIS — F4321 Adjustment disorder with depressed mood: Secondary | ICD-10-CM | POA: Diagnosis not present

## 2023-05-25 DIAGNOSIS — E559 Vitamin D deficiency, unspecified: Secondary | ICD-10-CM

## 2023-05-25 DIAGNOSIS — M810 Age-related osteoporosis without current pathological fracture: Secondary | ICD-10-CM

## 2023-05-25 DIAGNOSIS — E782 Mixed hyperlipidemia: Secondary | ICD-10-CM

## 2023-05-25 DIAGNOSIS — N1831 Chronic kidney disease, stage 3a: Secondary | ICD-10-CM

## 2023-05-25 DIAGNOSIS — I10 Essential (primary) hypertension: Secondary | ICD-10-CM

## 2023-05-25 DIAGNOSIS — E538 Deficiency of other specified B group vitamins: Secondary | ICD-10-CM

## 2023-05-25 DIAGNOSIS — D649 Anemia, unspecified: Secondary | ICD-10-CM

## 2023-05-25 DIAGNOSIS — M9080 Osteopathy in diseases classified elsewhere, unspecified site: Secondary | ICD-10-CM | POA: Diagnosis not present

## 2023-05-25 LAB — MICROALBUMIN, URINE WAIVED
Creatinine, Urine Waived: 100 mg/dL (ref 10–300)
Microalb, Ur Waived: 30 mg/L — ABNORMAL HIGH (ref 0–19)
Microalb/Creat Ratio: <30 mg/g (ref <30)

## 2023-05-25 MED ORDER — OLMESARTAN MEDOXOMIL-HCTZ 40-12.5 MG PO TABS: 1.0000 | ORAL_TABLET | Freq: Every day | ORAL | 4 refills | Status: DC

## 2023-05-25 MED ORDER — ROSUVASTATIN CALCIUM 5 MG PO TABS: ORAL_TABLET | ORAL | 4 refills | Status: DC

## 2023-05-25 MED ORDER — NEBIVOLOL HCL 20 MG PO TABS: ORAL_TABLET | ORAL | 4 refills | Status: DC

## 2023-05-25 NOTE — Assessment & Plan Note (Signed)
Followed by [facility] endocrinology, recent lab stable -- recheck today and continue supplement.

## 2023-05-25 NOTE — Assessment & Plan Note (Signed)
Chronic, ongoing.  Continue current medication regimen and adjust as needed.  Obtain labs today. 

## 2023-05-25 NOTE — Assessment & Plan Note (Addendum)
Chronic.  With underlying rickets, followed by [facility] endocrinology, continue supplement and current collaboration.

## 2023-05-25 NOTE — Assessment & Plan Note (Signed)
Ongoing, stable.  Fluctuates.  Recommend good hydration at home and cutting back on Advil use when possible.  Recheck today.  Refer to nephrology as needed.

## 2023-05-25 NOTE — Assessment & Plan Note (Addendum)
Chronic, stable.  BP at goal today in office and on occasional home checks.  Recommend she monitor BP at least a few mornings a week at home and document.  DASH diet at home.  Continue current medication regimen and adjust as needed.  Labs: CBC, CMP, TSH.  Refills sent in.

## 2023-05-25 NOTE — Assessment & Plan Note (Signed)
Chronic.  Followed by endocrinology at [facility] and receives monthly injections, continue this collaboration.  Will obtain labs for them today, as has upcoming visit.  Continue Vit D supplement as recommended by them.

## 2023-05-25 NOTE — Assessment & Plan Note (Signed)
Stable.  Recheck CBC, iron/ferritin today.  Discussed at length with her.  She is asymptomatic at this time.  ?related to kidneys vs GI or rickets.  Consider hematology or GI referral if worsening.

## 2023-05-25 NOTE — Progress Notes (Addendum)
BP 131/75   Pulse 61   Temp 98.3 F (36.8 C) (Oral)   Ht 4\' 6"  (1.372 m)   Wt 123 lb (55.8 kg)   SpO2 96%   BMI 29.66 kg/m    Subjective:    Patient ID: Felicia Robinson, female    DOB: 1945-08-13, 78 y.o.   MRN: [id number]  HPI: Felicia Robinson is a 78 y.o. female  Chief Complaint  Patient presents with   Chronic Kidney Disease   Hyperlipidemia   Hypertension   HYPERTENSION/HLD Continues on Olmesartan-HCTZ and Nebivolol + Rosuvastatin for 5 MG.  Hypertension status: stable  Satisfied with current treatment? yes Duration of hypertension: chronic BP monitoring frequency: sometimes BP range: 130/70 range BP medication side effects:  no Medication compliance: good compliance Aspirin: yes Recurrent headaches: no Visual changes: no Palpitations: no Dyspnea: no Chest pain: no Lower extremity edema: no Dizzy/lightheaded: no  The 10-year ASCVD risk score (Arnett DK, et al., 2019) is: 24.2%   Values used to calculate the score:     Age: 36 years     Sex: Female     Is Non-Hispanic African American: No     Diabetic: No     Tobacco smoker: No     Systolic Blood Pressure: 131 mmHg     Is BP treated: Yes     HDL Cholesterol: 66 mg/dL     Total Cholesterol: 160 mg/dL  OSTEOPOROSIS Last DEXA on 03/03/23 noting ongoing osteoporosis with T-score -3.8.  Taking Crysvita, for rickets and bone health.  Had fall in November while [place] the beach playing with [place] dog. Satisfied with current treatment?: yes Medication side effects: no Medication compliance: good compliance Past osteoporosis medications/treatments:  Adequate calcium & vitamin D: yes Intolerance to bisphosphonates:no Weight bearing exercises: yes   CHRONIC KIDNEY DISEASE (CKD 3a) Rickets is followed by endo at [facility] -- taking Vitamin D supplement.  Had last visit with them on 01/29/22, returns to see them this week.  CKD 3a on labs in past. CKD status: controlled Medications renally dose: no Previous renal  evaluation: yes Pneumovax:  Up to Date Influenza Vaccine:  Up to Date    ANEMIA Takes Ferrous Sulfate and B12. Anemia status: controlled Etiology of anemia: Duration of anemia treatment:  Compliance with treatment: good compliance Iron supplementation side effects: no Severity of anemia: mild Fatigue: no Decreased exercise tolerance: no  Dyspnea on exertion: no Palpitations: no Bleeding: no Pica: no   DEPRESSION Continues Sertraline 75 MG daily. Mood status: stable Satisfied with current treatment?: yes Symptom severity: moderate  Duration of current treatment : chronic Side effects: no Medication compliance: good compliance Psychotherapy/counseling: none Depressed mood: no Anxious mood: no Anhedonia: a little bit Significant weight loss or gain: no Insomnia: no Fatigue: no Feelings of worthlessness or guilt: sometimes Impaired concentration/indecisiveness: no Suicidal ideations: no Hopelessness: no Crying spells: no    05/25/2023    1:48 PM 01/15/2023    1:24 PM 11/11/2022   11:07 AM 06/10/2022    1:54 PM 05/13/2022    1:33 PM  Depression screen PHQ 2/9  Decreased Interest 1 0 2 1 0  Down, Depressed, Hopeless 1 1 1  0 0  PHQ - 2 Score 2 1 3 1  0  Altered sleeping 0 0 0 0 0  Tired, decreased energy 1 0 0 0 0  Change in appetite 0 0 2 1 0  Feeling bad or failure about yourself  0 0 1 0 0  Trouble concentrating  0 0 1 0 0  Moving slowly or fidgety/restless 0 0 0 0 0  Suicidal thoughts 0 0 0 0 0  PHQ-9 Score 3 1 7 2  0  Difficult doing work/chores Not difficult at all Somewhat difficult Somewhat difficult Not difficult at all Not difficult at all       05/25/2023    1:48 PM 01/15/2023    1:24 PM 11/11/2022   11:07 AM 06/10/2022    1:55 PM  GAD 7 : Generalized Anxiety Score  Nervous, Anxious, on Edge 0 0 1 1  Control/stop worrying 1 1 1  0  Worry too much - different things 1 1 1  0  Trouble relaxing 0 0 1 1  Restless 0 0 1 0  Easily annoyed or irritable 0 0 1 0   Afraid - awful might happen 1 1 1 1   Total GAD 7 Score 3 3 7 3   Anxiety Difficulty Somewhat difficult Somewhat difficult Somewhat difficult Not difficult at all   Relevant past medical, surgical, family and social history reviewed and updated as indicated. Interim medical history since our last visit reviewed. Allergies and medications reviewed and updated.  Review of Systems  Constitutional:  Negative for chills, fatigue and fever.  Respiratory:  Negative for cough, chest tightness and shortness of breath.   Gastrointestinal:  Negative for diarrhea, nausea and vomiting.  Endocrine: Negative for cold intolerance, heat intolerance, polydipsia, polyphagia and polyuria.  Genitourinary: Negative.   Musculoskeletal:  Negative for arthralgias, joint swelling and myalgias.  Skin:  Negative for rash.  Neurological: Negative.   Psychiatric/Behavioral:  Negative for confusion and suicidal ideas. The patient is not nervous/anxious.    Per HPI unless specifically indicated above     Objective:    BP 131/75   Pulse 61   Temp 98.3 F (36.8 C) (Oral)   Ht 4\' 6"  (1.372 m)   Wt 123 lb (55.8 kg)   SpO2 96%   BMI 29.66 kg/m   Wt Readings from Last 3 Encounters:  05/25/23 123 lb (55.8 kg)  01/15/23 127 lb 6.4 oz (57.8 kg)  11/11/22 126 lb (57.2 kg)    Physical Exam Vitals and nursing note reviewed.  Constitutional:      General: She is awake. She is not in acute distress.    Appearance: She is well-developed and well-groomed. She is not ill-appearing or toxic-appearing.  HENT:     Head: Normocephalic.     Right Ear: Hearing and external ear normal.     Left Ear: Hearing and external ear normal.  Eyes:     General: Lids are normal.        Right eye: No discharge.        Left eye: No discharge.     Conjunctiva/sclera: Conjunctivae normal.     Pupils: Pupils are equal, round, and reactive to light.  Neck:     Thyroid: No thyromegaly.     Vascular: No carotid bruit.  Cardiovascular:      Rate and Rhythm: Normal rate and regular rhythm.     Pulses: Normal pulses.     Heart sounds: Normal heart sounds. No murmur heard.    No gallop.  Pulmonary:     Effort: Pulmonary effort is normal. No accessory muscle usage or respiratory distress.     Breath sounds: Normal breath sounds.  Abdominal:     General: Bowel sounds are normal. There is no distension.     Palpations: Abdomen is soft.     Tenderness:  There is no abdominal tenderness.  Musculoskeletal:     Cervical back: Normal range of motion and neck supple.     Right lower leg: No edema.     Left lower leg: No edema.  Lymphadenopathy:     Cervical: No cervical adenopathy.  Skin:    General: Skin is warm.     Capillary Refill: Capillary refill takes less than 2 seconds.  Neurological:     Mental Status: She is alert and oriented to person, place, and time.     Deep Tendon Reflexes: Reflexes are normal and symmetric.  Psychiatric:        Attention and Perception: Attention normal.        Mood and Affect: Mood normal.        Speech: Speech normal.        Behavior: Behavior normal. Behavior is cooperative.        Thought Content: Thought content normal.    Results for orders placed or performed in visit on 01/15/23  CBC   Collection Time: 01/15/23  1:40 PM  Result Value Ref Range   WBC 8.2 3.4 - 10.8 x10E3/uL   RBC 4.59 3.77 - 5.28 x10E6/uL   Hemoglobin 13.5 11.1 - 15.9 g/dL   Hematocrit 16.1 09.6 - 46.6 %   MCV 95 79 - 97 fL   MCH 29.4 26.6 - 33.0 pg   MCHC 31.0 (L) 31.5 - 35.7 g/dL   RDW 04.5 40.9 - 81.1 %   Platelets 218 150 - 450 x10E3/uL  Basic Metabolic Panel (BMET)   Collection Time: 01/15/23  1:40 PM  Result Value Ref Range   Glucose 90 70 - 99 mg/dL   BUN 21 8 - 27 mg/dL   Creatinine, Ser 9.14 (H) 0.57 - 1.00 mg/dL   eGFR 54 (L) >78 GN/FAO/1.30   BUN/Creatinine Ratio 20 12 - 28   Sodium 138 134 - 144 mmol/L   Potassium 4.5 3.5 - 5.2 mmol/L   Chloride 101 96 - 106 mmol/L   CO2 23 20 - 29  mmol/L   Calcium 9.7 8.7 - 10.3 mg/dL      Assessment & Plan:   Problem List Items Addressed This Visit       Cardiovascular and Mediastinum   Essential hypertension   Chronic, stable.  BP at goal today in office and on occasional home checks.  Recommend she monitor BP at least a few mornings a week at home and document.  DASH diet at home.  Continue current medication regimen and adjust as needed.  Labs: CBC, CMP, TSH.  Refills sent in.       Relevant Medications   Nebivolol HCl 20 MG TABS   olmesartan-hydrochlorothiazide (BENICAR HCT) 40-12.5 MG tablet   rosuvastatin (CRESTOR) 5 MG tablet     Musculoskeletal and Integument   Osteoporosis   Chronic, ongoing.  Followed at [facility] for rickets and obtains injections (Crysvita).  Recent DEXA notes ongoing osteoporosis.  Continue current collaboration.      Relevant Orders   Parathyroid hormone, intact (no Ca)   X-linked hypophosphatemic rickets   Chronic.  Followed by endocrinology at [facility] and receives monthly injections, continue this collaboration.  Will obtain labs for them today, as has upcoming visit.  Continue Vit D supplement as recommended by them.       Relevant Orders   CBC with Differential/Platelet   VITAMIN D 25 Hydroxy (Vit-D Deficiency, Fractures)   Parathyroid hormone, intact (no Ca)   Phosphorus  Genitourinary   CKD (chronic kidney disease) stage 3, GFR 30-59 ml/min (HCC) - Primary   Ongoing, stable.  Fluctuates.  Recommend good hydration at home and cutting back on Advil use when possible.  Recheck today.  Refer to nephrology as needed.      Relevant Orders   Comprehensive metabolic panel   Microalbumin, Urine Waived     Other   Anemia   Stable.  Recheck CBC, iron/ferritin today.  Discussed at length with her.  She is asymptomatic at this time.  ?related to kidneys vs GI or rickets.  Consider hematology or GI referral if worsening.      Relevant Orders   Ferritin   Iron   B12 deficiency   Followed  by [facility] endocrinology, recent lab stable -- recheck today and continue supplement.      Relevant Orders   Vitamin B12   Grief reaction   Chronic, ongoing.  Will continue Sertraline 75 MG daily and adjust further as needed.  She is aware she can return to 50 MG in future if stressors improve.  Continue to monitor closely.  Denies SI/HI.  To reach out to provider if any concerns.      Mixed hyperlipidemia   Chronic, ongoing.  Continue current medication regimen and adjust as needed.  Obtain labs today.      Relevant Medications   Nebivolol HCl 20 MG TABS   olmesartan-hydrochlorothiazide (BENICAR HCT) 40-12.5 MG tablet   rosuvastatin (CRESTOR) 5 MG tablet   Other Relevant Orders   Lipid Panel w/o Chol/HDL Ratio   Vitamin D deficiency   Chronic.  With underlying rickets, followed by [facility] endocrinology, continue supplement and current collaboration.      Relevant Orders   VITAMIN D 25 Hydroxy (Vit-D Deficiency, Fractures)     Follow up plan: Return in about 6 months (around 11/22/2023) for Annual Physical after 11/11/23 + needs nurse Medicare Wellness after 06/11/23.

## 2023-05-25 NOTE — Assessment & Plan Note (Signed)
Chronic, ongoing.  Followed at [facility] for rickets and obtains injections (Crysvita).  Recent DEXA notes ongoing osteoporosis.  Continue current collaboration.

## 2023-05-25 NOTE — Assessment & Plan Note (Addendum)
Chronic, ongoing.  Will continue Sertraline 75 MG daily and adjust further as needed.  She is aware she can return to 50 MG in future if stressors improve.  Continue to monitor closely.  Denies SI/HI.  To reach out to provider if any concerns.

## 2023-05-26 ENCOUNTER — Encounter: Payer: Self-pay | Admitting: Nurse Practitioner

## 2023-05-26 LAB — LIPID PANEL W/O CHOL/HDL RATIO
Cholesterol, Total: 153 mg/dL (ref 100–199)
HDL: 59 mg/dL (ref >39)
LDL Chol Calc (NIH): 55 mg/dL (ref 0–99)
Triglycerides: 246 mg/dL — ABNORMAL HIGH (ref 0–149)
VLDL Cholesterol Cal: 39 mg/dL (ref 5–40)

## 2023-05-26 LAB — VITAMIN D 25 HYDROXY (VIT D DEFICIENCY, FRACTURES): Vit D, 25-Hydroxy: 41 ng/mL (ref 30.0–100.0)

## 2023-05-26 LAB — COMPREHENSIVE METABOLIC PANEL
ALT: 10 [IU]/L (ref 0–32)
AST: 17 [IU]/L (ref 0–40)
Albumin: 4.7 g/dL (ref 3.8–4.8)
Alkaline Phosphatase: 180 [IU]/L — ABNORMAL HIGH (ref 44–121)
BUN/Creatinine Ratio: 19 (ref 12–28)
BUN: 22 mg/dL (ref 8–27)
Bilirubin Total: 0.3 mg/dL (ref 0.0–1.2)
CO2: 22 mmol/L (ref 20–29)
Calcium: 9.6 mg/dL (ref 8.7–10.3)
Chloride: 100 mmol/L (ref 96–106)
Creatinine, Ser: 1.16 mg/dL — ABNORMAL HIGH (ref 0.57–1.00)
Globulin, Total: 2.6 g/dL (ref 1.5–4.5)
Glucose: 88 mg/dL (ref 70–99)
Potassium: 4.3 mmol/L (ref 3.5–5.2)
Sodium: 138 mmol/L (ref 134–144)
Total Protein: 7.3 g/dL (ref 6.0–8.5)
eGFR: 49 mL/min/{1.73_m2} — ABNORMAL LOW (ref >59)

## 2023-05-26 LAB — PARATHYROID HORMONE, INTACT (NO CA): PTH: 81 pg/mL — ABNORMAL HIGH (ref 15–65)

## 2023-05-26 LAB — CBC WITH DIFFERENTIAL/PLATELET
Basophils Absolute: 0.1 10*3/uL (ref 0.0–0.2)
Basos: 1 %
EOS (ABSOLUTE): 0.4 10*3/uL (ref 0.0–0.4)
Eos: 5 %
Hematocrit: 40.4 % (ref 34.0–46.6)
Hemoglobin: 12.8 g/dL (ref 11.1–15.9)
Immature Grans (Abs): 0 10*3/uL (ref 0.0–0.1)
Immature Granulocytes: 0 %
Lymphocytes Absolute: 1.7 10*3/uL (ref 0.7–3.1)
Lymphs: 20 %
MCH: 29.5 pg (ref 26.6–33.0)
MCHC: 31.7 g/dL (ref 31.5–35.7)
MCV: 93 fL (ref 79–97)
Monocytes Absolute: 0.6 10*3/uL (ref 0.1–0.9)
Monocytes: 7 %
Neutrophils Absolute: 5.9 10*3/uL (ref 1.4–7.0)
Neutrophils: 67 %
Platelets: 240 10*3/uL (ref 150–450)
RBC: 4.34 x10E6/uL (ref 3.77–5.28)
RDW: 12.4 % (ref 11.7–15.4)
WBC: 8.7 10*3/uL (ref 3.4–10.8)

## 2023-05-26 LAB — VITAMIN B12: Vitamin B-12: 473 pg/mL (ref 232–1245)

## 2023-05-26 LAB — FERRITIN: Ferritin: 27 ng/mL (ref 15–150)

## 2023-05-26 LAB — IRON: Iron: 104 ug/dL (ref 27–139)

## 2023-05-26 LAB — PHOSPHORUS: Phosphorus: 2.6 mg/dL — ABNORMAL LOW (ref 3.0–4.3)

## 2023-05-26 NOTE — Progress Notes (Signed)
Contacted via MyChart   Good morning [name], your labs have returned: - Kidney function, creatinine and eGFR, remains with Stage 3a kidney disease.  Continue to ensure good water intake daily and avoid Ibuprofen products.  Liver function, AST and ALT, are normal.  Alkaline phosphatase is a little elevated, which could be related to bone health. - Lipid panel shows at goal LDL.  Triglycerides are a little elevated, continue Rosuvastatin as ordered. - Phosphorous very mildly low, this can be related to bone health. - Remainder of labs are stable.  I drew all the labs your endocrinologist should need. Please make them aware at your visit with them because they can see under Care Everywhere.  Any questions? Keep being amazing!!  Thank you for allowing me to participate in your care.  I appreciate you. Kindest regards, Alyah Boehning

## 2023-05-28 NOTE — Telephone Encounter (Unsigned)
 Copied from CRM [id number]. Topic: Medicare AWV >> May 28, 2023  1:27 PM Nathanel DEL wrote: Reason for CRM: Called LVM 05/27/2023 to schedule AWV. Please schedule office or virtual visits.  Nathanel Paschal; Care Guide Ambulatory Clinical Support [place] [facility] Direct Dial: [phone]

## 2023-08-31 ENCOUNTER — Telehealth: Payer: Self-pay

## 2023-08-31 NOTE — Transitions of Care (Post Inpatient/ED Visit) (Signed)
 08/31/2023  Name: Felicia Robinson MRN: [id number] DOB: Oct 21, 1945  Today's TOC FU Call Status: Completed   Patient's Name and Date of Birth confirmed.  Transition Care Management Follow-up Telephone Call Discharge Facility: Other (Non-[facility] Facility) Name of Other (Non[facility]) Discharge Facility: [facility] Type of Discharge: Emergency Department How have you been since you were released from the hospital?: Better Any questions or concerns?: No  Items Reviewed: Did you receive and understand the discharge instructions provided?: Yes Medications obtained,verified, and reconciled?: Yes (Medications Reviewed) Any new allergies since your discharge?: No Dietary orders reviewed?: NA Do you have support at home?: Yes People in Home [RPT]: child(ren), adult Name of Support/Comfort Primary Source: Son  Medications Reviewed Today: Medications Reviewed Today     Reviewed by Geovonni Meyerhoff, Jilda Most, CMA (Certified Medical Assistant) on 08/31/23 at 1509  Med List Status: <None>   Medication Order Taking? Sig Documenting Provider Last Dose Status Informant  aspirin 81 MG chewable tablet [id number] Yes Chew 81 mg by mouth daily. [provider] Taking Active   Burosumab -twza (CRYSVITA ) 30 MG/ML SOLN [id number] Yes Inject 30 mg into the skin every 30 (thirty) days. [provider] Taking Active   cephALEXin (KEFLEX) 500 MG capsule [id number] Yes Take 500 mg by mouth 3 (three) times daily. [provider] Taking Active   Cholecalciferol (VITAMIN D3) 50 MCG (2000 UT) TABS [id number] Yes Take 2,000 Units by mouth daily. [provider] Taking Active   Cyanocobalamin  (VITAMIN B12 PO) [id number] Yes Take 3,000 mcg by mouth daily. [provider] Taking Active   Ferrous Sulfate (SLOW FE) 142 (45 Fe) MG TBCR [id number] Yes Take 1 tablet by mouth daily. Take with a little orange juice. Cannady, Jolene T, NP Taking Active   ibuprofen (ADVIL) 200 MG tablet  [id number] Yes Take 200 mg by mouth every 6 (six) hours as needed. [provider] Taking Active   Nebivolol  HCl 20 MG TABS [id number] Yes TAKE 1 TABLET(20 MG) BY MOUTH DAILY Cannady, Jolene T, NP Taking Active   olmesartan -hydrochlorothiazide (BENICAR  HCT) 40-12.5 MG tablet [id number] Yes Take 1 tablet by mouth daily. Cannady, Jolene T, NP Taking Active   ondansetron (ZOFRAN-ODT) 4 MG disintegrating tablet [id number] Yes Take 4 mg by mouth every 8 (eight) hours as needed. [provider] Taking Active   rosuvastatin  (CRESTOR ) 5 MG tablet [id number] Yes TAKE 1 TABLET(5 MG) BY MOUTH DAILY Cannady, Jolene T, NP Taking Active   sertraline  (ZOLOFT ) 50 MG tablet [id number] Yes Take 1.5 tablets (75 mg total) by mouth daily. Doran Galloway T, NP Taking Active             Home Care and Equipment/Supplies: Were Home Health Services Ordered?: No Any new equipment or medical supplies ordered?: No  Functional Questionnaire: Do you need assistance with bathing/showering or dressing?: No Do you need assistance with meal preparation?: No Do you need assistance with eating?: No Do you have difficulty maintaining continence: Yes Do you need assistance with getting out of bed/getting out of a chair/moving?: No Do you have difficulty managing or taking your medications?: No  Follow up appointments reviewed: PCP Follow-up appointment confirmed?: No, patient will call back when she is back in town to schedule with PCP.  MD Provider Line Number:[phone] Given: No  SDOH Interventions Today    Flowsheet Row Most Recent Value  SDOH Interventions   Food Insecurity Interventions Intervention Not Indicated  Housing Interventions Intervention Not Indicated  Transportation Interventions Intervention Not Indicated  Utilities Interventions Intervention  Not Indicated  Alcohol Usage Interventions Intervention Not Indicated (Score <7)  Financial Strain Interventions Intervention Not Indicated   Physical Activity Interventions Intervention Not Indicated  Stress Interventions Intervention Not Indicated  Social Connections Interventions Intervention Not Indicated  Health Literacy Interventions Intervention Not Indicated

## 2023-08-31 NOTE — Telephone Encounter (Signed)
 This encounter was created in error - please disregard.

## 2023-09-13 ENCOUNTER — Other Ambulatory Visit: Payer: Self-pay

## 2023-09-13 ENCOUNTER — Emergency Department

## 2023-09-13 ENCOUNTER — Emergency Department
Admission: EM | Admit: 2023-09-13 | Discharge: 2023-09-13 | Disposition: A | Discharge status: Home / Self Care | Attending: Emergency Medicine | Admitting: Emergency Medicine

## 2023-09-13 DIAGNOSIS — Z853 Personal history of malignant neoplasm of breast: Secondary | ICD-10-CM | POA: Insufficient documentation

## 2023-09-13 DIAGNOSIS — I129 Hypertensive chronic kidney disease with stage 1 through stage 4 chronic kidney disease, or unspecified chronic kidney disease: Secondary | ICD-10-CM | POA: Insufficient documentation

## 2023-09-13 DIAGNOSIS — R109 Unspecified abdominal pain: Secondary | ICD-10-CM | POA: Diagnosis present

## 2023-09-13 DIAGNOSIS — N189 Chronic kidney disease, unspecified: Secondary | ICD-10-CM | POA: Diagnosis not present

## 2023-09-13 DIAGNOSIS — M898X9 Other specified disorders of bone, unspecified site: Secondary | ICD-10-CM | POA: Diagnosis not present

## 2023-09-13 HISTORY — DX: Disorder of kidney and ureter, unspecified: N28.9

## 2023-09-13 HISTORY — DX: Rickets, active: E55.0

## 2023-09-13 LAB — COMPREHENSIVE METABOLIC PANEL WITH GFR
ALT: 17 U/L (ref 0–44)
AST: 25 U/L (ref 15–41)
Albumin: 3.7 g/dL (ref 3.5–5.0)
Alkaline Phosphatase: 152 U/L — ABNORMAL HIGH (ref 38–126)
Anion gap: 16 — ABNORMAL HIGH (ref 5–15)
BUN: 48 mg/dL — ABNORMAL HIGH (ref 8–23)
CO2: 20 mmol/L — ABNORMAL LOW (ref 22–32)
Calcium: 9 mg/dL (ref 8.9–10.3)
Chloride: 93 mmol/L — ABNORMAL LOW (ref 98–111)
Creatinine, Ser: 1.93 mg/dL — ABNORMAL HIGH (ref 0.44–1.00)
GFR, Estimated: 26 mL/min — ABNORMAL LOW (ref >60)
Glucose, Bld: 99 mg/dL (ref 70–99)
Potassium: 3.5 mmol/L (ref 3.5–5.1)
Sodium: 129 mmol/L — ABNORMAL LOW (ref 135–145)
Total Bilirubin: 0.8 mg/dL (ref 0.0–1.2)
Total Protein: 7.8 g/dL (ref 6.5–8.1)

## 2023-09-13 LAB — URINALYSIS, ROUTINE W REFLEX MICROSCOPIC
Bilirubin Urine: NEGATIVE
Glucose, UA: NEGATIVE mg/dL
Hgb urine dipstick: NEGATIVE
Ketones, ur: NEGATIVE mg/dL
Nitrite: NEGATIVE
Protein, ur: NEGATIVE mg/dL
Specific Gravity, Urine: 1.006 (ref 1.005–1.030)
pH: 5 (ref 5.0–8.0)

## 2023-09-13 LAB — CBC
HCT: 35.9 % — ABNORMAL LOW (ref 36.0–46.0)
Hemoglobin: 11.5 g/dL — ABNORMAL LOW (ref 12.0–15.0)
MCH: 28.9 pg (ref 26.0–34.0)
MCHC: 32 g/dL (ref 30.0–36.0)
MCV: 90.2 fL (ref 80.0–100.0)
Platelets: 380 10*3/uL (ref 150–400)
RBC: 3.98 MIL/uL (ref 3.87–5.11)
RDW: 12.7 % (ref 11.5–15.5)
WBC: 12.6 10*3/uL — ABNORMAL HIGH (ref 4.0–10.5)
nRBC: 0 % (ref 0.0–0.2)

## 2023-09-13 LAB — LIPASE, BLOOD: Lipase: 37 U/L (ref 11–51)

## 2023-09-13 LAB — TROPONIN I (HIGH SENSITIVITY): Troponin I (High Sensitivity): 5 ng/L (ref <18)

## 2023-09-13 MED ORDER — SODIUM CHLORIDE 0.9 % IV BOLUS
500.0000 mL | Freq: Once | INTRAVENOUS | Status: AC
  Administered 2023-09-13: 500 mL via INTRAVENOUS

## 2023-09-13 NOTE — ED Provider Notes (Signed)
 [facility] Provider Note    Event Date/Time   First MD Initiated Contact with Patient 09/13/23 1536     (approximate)   History   Abdominal distention   HPI  Felicia Robinson is a 78 y.o. female with a history of breast cancer, chronic kidney disease, hypertension who presents with complaints of abdominal distention and abdominal discomfort.  Patient reports 1 week ago she was treated at other facility and was told that she had UTI, she was treated with p.o. antibiotics and has completed the course.  She went to urgent care today because she reports she continues to have abdominal discomfort, loose stools and nausea.  No chest pain she denies LOC to me     Physical Exam   Triage Vital Signs: ED Triage Vitals  Encounter Vitals Group     BP 09/13/23 1531 (!) 151/81     Systolic BP Percentile --      Diastolic BP Percentile --      Pulse Rate 09/13/23 1531 66     Resp 09/13/23 1531 20     Temp 09/13/23 1531 98.5 F (36.9 C)     Temp Source 09/13/23 1531 Oral     SpO2 09/13/23 1531 100 %     Weight 09/13/23 1530 54 kg (119 lb)     Height 09/13/23 1530 1.422 m (4\' 8" )     Head Circumference --      Peak Flow --      Pain Score 09/13/23 1529 0     Pain Loc --      Pain Education --      Exclude from Growth Chart --     Most recent vital signs: Vitals:   09/13/23 1531  BP: (!) 151/81  Pulse: 66  Resp: 20  Temp: 98.5 F (36.9 C)  SpO2: 100%     General: Awake, no distress.  CV:  Good peripheral perfusion.  Resp:  Normal effort.  Abd:  Mild lower abdomen distention Other:     ED Results / Procedures / Treatments   Labs (all labs ordered are listed, but only abnormal results are displayed) Labs Reviewed  CBC - Abnormal; Notable for the following components:      Result Value   WBC 12.6 (*)    Hemoglobin 11.5 (*)    HCT 35.9 (*)    All other components within normal limits  URINALYSIS, ROUTINE W REFLEX MICROSCOPIC - Abnormal;  Notable for the following components:   Color, Urine YELLOW (*)    APPearance HAZY (*)    Leukocytes,Ua TRACE (*)    Bacteria, UA FEW (*)    All other components within normal limits  COMPREHENSIVE METABOLIC PANEL WITH GFR - Abnormal; Notable for the following components:   Sodium 129 (*)    Chloride 93 (*)    CO2 20 (*)    BUN 48 (*)    Creatinine, Ser 1.93 (*)    Alkaline Phosphatase 152 (*)    GFR, Estimated 26 (*)    Anion gap 16 (*)    All other components within normal limits  URINE CULTURE  LIPASE, BLOOD  CBG MONITORING, ED  TROPONIN I (HIGH SENSITIVITY)     EKG     RADIOLOGY CT abdomen pelvis pending    PROCEDURES:  Critical Care performed:   Procedures   MEDICATIONS ORDERED IN ED: Medications  sodium chloride  0.9 % bolus 500 mL (0 mLs Intravenous Stopped 09/13/23 1700)  sodium chloride   0.9 % bolus 500 mL (500 mLs Intravenous New Bag/Given 09/13/23 1705)     IMPRESSION / MDM / ASSESSMENT AND PLAN / ED COURSE  I reviewed the triage vital signs and the nursing notes. Patient's presentation is most consistent with acute presentation with potential threat to life or bodily function.  Patient presents with lower abdominal distention noted on exam, mild tenderness to palpation.  Differential includes treatment failure of UTI, mass, constipation, colitis, less likely C. Difficile  Will treat with IV fluids, obtain CT abdomen pelvis and reevaluate.  CT scan without acute abnormality, bony possibly lytic lesions noted, discussed with Dr. Wilhelmenia Harada of oncology, she does not feel these are related to childhood rickets that the patient thinks they are related to.  She will follow-up with Dr. Wilhelmenia Harada in the office, discussed this with patient her friend, and her son, they agree with this plan     FINAL CLINICAL IMPRESSION(S) / ED DIAGNOSES   Final diagnoses:  Abdominal discomfort  Lytic bone lesions on xray     Rx / DC Orders   ED Discharge Orders     None         Note:  This document was prepared using Dragon voice recognition software and may include unintentional dictation errors.   Bryson Carbine, MD 09/13/23 [phone]

## 2023-09-13 NOTE — ED Triage Notes (Signed)
 To ED from [facility] for syncopal episode 1 week ago and continued pelvic pressure after treated for UTI. Seen at ED for LOC on 5/11 and at that time was dx with UTI and dehydration.

## 2023-09-13 NOTE — Discharge Instructions (Signed)
 Please follow-up with Dr. Wilhelmenia Harada and your endocrinologist as we discussed

## 2023-09-14 LAB — URINE CULTURE: Culture: 30000 — AB

## 2023-09-17 ENCOUNTER — Inpatient Hospital Stay: Admitting: Nurse Practitioner

## 2023-09-17 NOTE — Patient Instructions (Incomplete)

## 2023-09-18 ENCOUNTER — Other Ambulatory Visit: Payer: Self-pay

## 2023-09-18 ENCOUNTER — Inpatient Hospital Stay
Admission: EM | Admit: 2023-09-18 | Discharge: 2023-09-20 | DRG: 683 | Disposition: A | Discharge status: Home / Self Care | Attending: Family Medicine | Admitting: Family Medicine

## 2023-09-18 ENCOUNTER — Inpatient Hospital Stay

## 2023-09-18 ENCOUNTER — Encounter: Payer: Self-pay | Admitting: Oncology

## 2023-09-18 ENCOUNTER — Emergency Department

## 2023-09-18 ENCOUNTER — Inpatient Hospital Stay: Admitting: Nurse Practitioner

## 2023-09-18 ENCOUNTER — Inpatient Hospital Stay: Attending: Oncology | Admitting: Oncology

## 2023-09-18 VITALS — BP 86/62 | HR 86 | Temp 99.1°F | Resp 18 | Wt 116.0 lb

## 2023-09-18 DIAGNOSIS — E876 Hypokalemia: Secondary | ICD-10-CM | POA: Diagnosis not present

## 2023-09-18 DIAGNOSIS — N179 Acute kidney failure, unspecified: Secondary | ICD-10-CM | POA: Insufficient documentation

## 2023-09-18 DIAGNOSIS — Z1152 Encounter for screening for COVID-19: Secondary | ICD-10-CM | POA: Diagnosis not present

## 2023-09-18 DIAGNOSIS — Z807 Family history of other malignant neoplasms of lymphoid, hematopoietic and related tissues: Secondary | ICD-10-CM | POA: Insufficient documentation

## 2023-09-18 DIAGNOSIS — M899 Disorder of bone, unspecified: Secondary | ICD-10-CM | POA: Insufficient documentation

## 2023-09-18 DIAGNOSIS — Z800 Family history of malignant neoplasm of digestive organs: Secondary | ICD-10-CM | POA: Diagnosis not present

## 2023-09-18 DIAGNOSIS — Z8249 Family history of ischemic heart disease and other diseases of the circulatory system: Secondary | ICD-10-CM

## 2023-09-18 DIAGNOSIS — Z888 Allergy status to other drugs, medicaments and biological substances status: Secondary | ICD-10-CM

## 2023-09-18 DIAGNOSIS — D75839 Thrombocytosis, unspecified: Secondary | ICD-10-CM | POA: Diagnosis not present

## 2023-09-18 DIAGNOSIS — N1831 Chronic kidney disease, stage 3a: Secondary | ICD-10-CM

## 2023-09-18 DIAGNOSIS — E550 Rickets, active: Secondary | ICD-10-CM | POA: Diagnosis present

## 2023-09-18 DIAGNOSIS — Z79899 Other long term (current) drug therapy: Secondary | ICD-10-CM

## 2023-09-18 DIAGNOSIS — E041 Nontoxic single thyroid nodule: Secondary | ICD-10-CM | POA: Diagnosis not present

## 2023-09-18 DIAGNOSIS — Z885 Allergy status to narcotic agent status: Secondary | ICD-10-CM

## 2023-09-18 DIAGNOSIS — F4320 Adjustment disorder, unspecified: Secondary | ICD-10-CM | POA: Diagnosis not present

## 2023-09-18 DIAGNOSIS — Z853 Personal history of malignant neoplasm of breast: Secondary | ICD-10-CM | POA: Insufficient documentation

## 2023-09-18 DIAGNOSIS — Z87891 Personal history of nicotine dependence: Secondary | ICD-10-CM | POA: Insufficient documentation

## 2023-09-18 DIAGNOSIS — Z7982 Long term (current) use of aspirin: Secondary | ICD-10-CM

## 2023-09-18 DIAGNOSIS — I129 Hypertensive chronic kidney disease with stage 1 through stage 4 chronic kidney disease, or unspecified chronic kidney disease: Secondary | ICD-10-CM | POA: Diagnosis not present

## 2023-09-18 DIAGNOSIS — Z9012 Acquired absence of left breast and nipple: Secondary | ICD-10-CM | POA: Insufficient documentation

## 2023-09-18 DIAGNOSIS — E8809 Other disorders of plasma-protein metabolism, not elsewhere classified: Secondary | ICD-10-CM | POA: Diagnosis present

## 2023-09-18 DIAGNOSIS — Z820 Family history of epilepsy and other diseases of the nervous system: Secondary | ICD-10-CM | POA: Diagnosis not present

## 2023-09-18 DIAGNOSIS — E782 Mixed hyperlipidemia: Secondary | ICD-10-CM | POA: Diagnosis not present

## 2023-09-18 DIAGNOSIS — I959 Hypotension, unspecified: Secondary | ICD-10-CM | POA: Insufficient documentation

## 2023-09-18 DIAGNOSIS — Z91013 Allergy to seafood: Secondary | ICD-10-CM

## 2023-09-18 DIAGNOSIS — K769 Liver disease, unspecified: Secondary | ICD-10-CM | POA: Diagnosis present

## 2023-09-18 DIAGNOSIS — K4090 Unilateral inguinal hernia, without obstruction or gangrene, not specified as recurrent: Secondary | ICD-10-CM | POA: Diagnosis present

## 2023-09-18 DIAGNOSIS — M898X9 Other specified disorders of bone, unspecified site: Secondary | ICD-10-CM

## 2023-09-18 DIAGNOSIS — Z823 Family history of stroke: Secondary | ICD-10-CM | POA: Diagnosis not present

## 2023-09-18 DIAGNOSIS — E860 Dehydration: Secondary | ICD-10-CM | POA: Diagnosis present

## 2023-09-18 DIAGNOSIS — M109 Gout, unspecified: Secondary | ICD-10-CM | POA: Diagnosis present

## 2023-09-18 DIAGNOSIS — K429 Umbilical hernia without obstruction or gangrene: Secondary | ICD-10-CM | POA: Diagnosis present

## 2023-09-18 DIAGNOSIS — N189 Chronic kidney disease, unspecified: Secondary | ICD-10-CM | POA: Diagnosis present

## 2023-09-18 LAB — URINALYSIS, W/ REFLEX TO CULTURE (INFECTION SUSPECTED)
Bilirubin Urine: NEGATIVE
Glucose, UA: NEGATIVE mg/dL
Hgb urine dipstick: NEGATIVE
Ketones, ur: NEGATIVE mg/dL
Leukocytes,Ua: NEGATIVE
Nitrite: NEGATIVE
Protein, ur: NEGATIVE mg/dL
Specific Gravity, Urine: 1.012 (ref 1.005–1.030)
pH: 5 (ref 5.0–8.0)

## 2023-09-18 LAB — CBC WITH DIFFERENTIAL/PLATELET
Abs Immature Granulocytes: 0.09 10*3/uL — ABNORMAL HIGH (ref 0.00–0.07)
Basophils Absolute: 0 10*3/uL (ref 0.0–0.1)
Basophils Relative: 0 %
Eosinophils Absolute: 0.1 10*3/uL (ref 0.0–0.5)
Eosinophils Relative: 1 %
HCT: 37 % (ref 36.0–46.0)
Hemoglobin: 12.1 g/dL (ref 12.0–15.0)
Immature Granulocytes: 1 %
Lymphocytes Relative: 10 %
Lymphs Abs: 1.3 10*3/uL (ref 0.7–4.0)
MCH: 28.8 pg (ref 26.0–34.0)
MCHC: 32.7 g/dL (ref 30.0–36.0)
MCV: 88.1 fL (ref 80.0–100.0)
Monocytes Absolute: 0.7 10*3/uL (ref 0.1–1.0)
Monocytes Relative: 6 %
Neutro Abs: 10.3 10*3/uL — ABNORMAL HIGH (ref 1.7–7.7)
Neutrophils Relative %: 82 %
Platelets: 472 10*3/uL — ABNORMAL HIGH (ref 150–400)
RBC: 4.2 MIL/uL (ref 3.87–5.11)
RDW: 12.9 % (ref 11.5–15.5)
WBC: 12.6 10*3/uL — ABNORMAL HIGH (ref 4.0–10.5)
nRBC: 0 % (ref 0.0–0.2)

## 2023-09-18 LAB — RESP PANEL BY RT-PCR (RSV, FLU A&B, COVID)  RVPGX2
Influenza A by PCR: NEGATIVE
Influenza B by PCR: NEGATIVE
Resp Syncytial Virus by PCR: NEGATIVE
SARS Coronavirus 2 by RT PCR: NEGATIVE

## 2023-09-18 LAB — TECHNOLOGIST SMEAR REVIEW: Plt Morphology: INCREASED

## 2023-09-18 LAB — COMPREHENSIVE METABOLIC PANEL WITH GFR
ALT: 15 U/L (ref 0–44)
AST: 21 U/L (ref 15–41)
Albumin: 3.2 g/dL — ABNORMAL LOW (ref 3.5–5.0)
Alkaline Phosphatase: 152 U/L — ABNORMAL HIGH (ref 38–126)
Anion gap: 11 (ref 5–15)
BUN: 46 mg/dL — ABNORMAL HIGH (ref 8–23)
CO2: 19 mmol/L — ABNORMAL LOW (ref 22–32)
Calcium: 8.1 mg/dL — ABNORMAL LOW (ref 8.9–10.3)
Chloride: 100 mmol/L (ref 98–111)
Creatinine, Ser: 2.42 mg/dL — ABNORMAL HIGH (ref 0.44–1.00)
GFR, Estimated: 20 mL/min — ABNORMAL LOW (ref >60)
Glucose, Bld: 152 mg/dL — ABNORMAL HIGH (ref 70–99)
Potassium: 3.3 mmol/L — ABNORMAL LOW (ref 3.5–5.1)
Sodium: 130 mmol/L — ABNORMAL LOW (ref 135–145)
Total Bilirubin: 0.8 mg/dL (ref 0.0–1.2)
Total Protein: 7 g/dL (ref 6.5–8.1)

## 2023-09-18 LAB — LACTATE DEHYDROGENASE: LDH: 140 U/L (ref 98–192)

## 2023-09-18 MED ORDER — MUSCLE RUB 10-15 % EX CREA: 1.0000 | TOPICAL_CREAM | CUTANEOUS | Status: DC | PRN

## 2023-09-18 MED ORDER — ONDANSETRON 4 MG PO TBDP: 4.0000 mg | ORAL_TABLET | Freq: Three times a day (TID) | ORAL | Status: DC | PRN

## 2023-09-18 MED ORDER — SODIUM CHLORIDE 0.9 % IV SOLN: INTRAVENOUS | Status: AC

## 2023-09-18 MED ORDER — SALINE SPRAY 0.65 % NA SOLN: 1.0000 | NASAL | Status: DC | PRN

## 2023-09-18 MED ORDER — SERTRALINE HCL 50 MG PO TABS
50.0000 mg | ORAL_TABLET | Freq: Every day | ORAL | Status: DC
  Administered 2023-09-19 – 2023-09-20 (×2): 50 mg via ORAL
  Filled 2023-09-18 (×2): qty 1

## 2023-09-18 MED ORDER — ACETAMINOPHEN 325 MG PO TABS: 650.0000 mg | ORAL_TABLET | Freq: Four times a day (QID) | ORAL | Status: DC | PRN

## 2023-09-18 MED ORDER — HYDRALAZINE HCL 20 MG/ML IJ SOLN: 10.0000 mg | Freq: Four times a day (QID) | INTRAMUSCULAR | Status: DC | PRN

## 2023-09-18 MED ORDER — HYDROCODONE-ACETAMINOPHEN 5-325 MG PO TABS: 1.0000 | ORAL_TABLET | Freq: Four times a day (QID) | ORAL | Status: DC | PRN

## 2023-09-18 MED ORDER — SODIUM CHLORIDE 0.9 % IV SOLN: Freq: Once | INTRAVENOUS | Status: AC

## 2023-09-18 MED ORDER — POTASSIUM CHLORIDE CRYS ER 20 MEQ PO TBCR
40.0000 meq | EXTENDED_RELEASE_TABLET | Freq: Once | ORAL | Status: AC
  Administered 2023-09-18: 40 meq via ORAL
  Filled 2023-09-18: qty 2

## 2023-09-18 MED ORDER — METOCLOPRAMIDE HCL 5 MG/ML IJ SOLN
5.0000 mg | Freq: Three times a day (TID) | INTRAMUSCULAR | Status: DC | PRN
  Administered 2023-09-18: 5 mg via INTRAVENOUS
  Filled 2023-09-18: qty 2

## 2023-09-18 MED ORDER — HEPARIN SODIUM (PORCINE) 5000 UNIT/ML IJ SOLN
5000.0000 [IU] | Freq: Three times a day (TID) | INTRAMUSCULAR | Status: DC
  Administered 2023-09-18 – 2023-09-20 (×5): 5000 [IU] via SUBCUTANEOUS
  Filled 2023-09-18 (×5): qty 1

## 2023-09-18 MED ORDER — ONDANSETRON HCL 4 MG/2ML IJ SOLN
4.0000 mg | Freq: Once | INTRAMUSCULAR | Status: AC
  Administered 2023-09-18: 4 mg via INTRAVENOUS
  Filled 2023-09-18: qty 2

## 2023-09-18 NOTE — Assessment & Plan Note (Signed)
 Patient follows up with endocrinology at [facility] for management. On crysvita 

## 2023-09-18 NOTE — ED Provider Notes (Signed)
 [facility] Provider Note    Event Date/Time   First MD Initiated Contact with Patient 09/18/23 1543     (approximate)   History   Hypotension   HPI  Felicia Robinson is a 78 y.o. female with a history of rickets, new discovery of possible lytic bone lesions who presents with complaints of weakness, hypotension.  Patient was sent over from the cancer center for hypotension.  She continues to have intermittent nausea and loose stools.  Denies abdominal pain     Physical Exam   Triage Vital Signs: ED Triage Vitals  Encounter Vitals Group     BP 09/18/23 1253 101/79     Systolic BP Percentile --      Diastolic BP Percentile --      Pulse Rate 09/18/23 1253 88     Resp 09/18/23 1253 20     Temp 09/18/23 1257 98.7 F (37.1 C)     Temp Source 09/18/23 1253 Oral     SpO2 09/18/23 1253 98 %     Weight --      Height --      Head Circumference --      Peak Flow --      Pain Score 09/18/23 1253 0     Pain Loc --      Pain Education --      Exclude from Growth Chart --     Most recent vital signs: Vitals:   09/18/23 1630 09/18/23 1700  BP: 133/70 134/77  Pulse: 86 81  Resp: (!) 25 19  Temp:    SpO2: 100% 98%     General: Awake, no distress.  CV:  Good peripheral perfusion.  Resp:  Normal effort.  Abd:  No distention.  Soft, nontender Other:     ED Results / Procedures / Treatments   Labs (all labs ordered are listed, but only abnormal results are displayed) Labs Reviewed  RESP PANEL BY RT-PCR (RSV, FLU A&B, COVID)  [id number]  URINALYSIS, W/ REFLEX TO CULTURE (INFECTION SUSPECTED)  MAGNESIUM  CBC WITH DIFFERENTIAL/PLATELET  COMPREHENSIVE METABOLIC PANEL WITH GFR  PHOSPHORUS  TSH  HEMOGLOBIN A1C     EKG     RADIOLOGY Chest x-ray viewed interpret by me, no acute abnormality    PROCEDURES:  Critical Care performed:   Procedures   MEDICATIONS ORDERED IN ED: Medications  heparin  injection 5,000 Units (has no  administration in time range)  0.9 %  sodium chloride  infusion ( Intravenous New Bag/Given 09/18/23 1824)  acetaminophen  (TYLENOL ) tablet 650 mg (has no administration in time range)  HYDROcodone -acetaminophen  (NORCO/VICODIN) 5-325 MG per tablet 1 tablet (has no administration in time range)  hydrALAZINE  (APRESOLINE ) injection 10 mg (has no administration in time range)  Muscle Rub CREA 1 Application (has no administration in time range)  sodium chloride  (OCEAN) 0.65 % nasal spray 1 spray (has no administration in time range)  potassium chloride  SA (KLOR-CON  M) CR tablet 40 mEq (has no administration in time range)  sertraline  (ZOLOFT ) tablet 50 mg (has no administration in time range)  ondansetron  (ZOFRAN -ODT) disintegrating tablet 4 mg (has no administration in time range)  metoCLOPramide  (REGLAN ) injection 5 mg (5 mg Intravenous Given 09/18/23 1804)  0.9 %  sodium chloride  infusion (0 mLs Intravenous Stopped 09/18/23 1748)  ondansetron  (ZOFRAN ) injection 4 mg (4 mg Intravenous Given 09/18/23 1602)     IMPRESSION / MDM / ASSESSMENT AND PLAN / ED COURSE  I reviewed the triage vital signs and  the nursing notes. Patient's presentation is most consistent with acute presentation with potential threat to life or bodily function.  Reviewed lab work performed today at the cancer center.  Patient has increased BUN and creatinine.  Be creatinine is up from 1.93-2.42 consistent with acute kidney injury  IV fluids infusing, I have discussed with the hospitalist team for admission        FINAL CLINICAL IMPRESSION(S) / ED DIAGNOSES   Final diagnoses:  Dehydration  AKI (acute kidney injury) (HCC)     Rx / DC Orders   ED Discharge Orders     None        Note:  This document was prepared using Dragon voice recognition software and may include unintentional dictation errors.   Bryson Carbine, MD 09/18/23 [phone]

## 2023-09-18 NOTE — ED Triage Notes (Signed)
 Pt to ED via POV from cancer center. Pt was recently seen for UTI and finished antibiotics. Pt sent from cancer center due to possible kidney infection and hypotension SBP 80s.   Pt seen at the cancer center after finding lytic bone lesions on xray.   Pt had blood done at cancer center at 12:30. WBC 12.6, Na+ 130, elevated BUN/Cr

## 2023-09-18 NOTE — H&P (Signed)
 History and Physical    Patient: Felicia Robinson ZOX:[id number] DOB: 1946-01-25 DOA: 09/18/2023 DOS: the patient was seen and examined on 09/18/2023 PCP: Lemar Pyles, NP  Patient coming from: Home  Chief Complaint:  Chief Complaint  Patient presents with   Hypotension   HPI: Felicia Robinson is a 78 y.o. female with medical history significant of stage IIIa CKD, X-linked hypophosphatemic rickets follows with [facility], osteoporosis, anemia, B12 deficiency, grief reaction, mixed hyperlipidemia, vitamin D  deficiency, history of breast cancer, hypertension. On May 11, pt reports she syncopized at home and was brought to ED in [facility]. She reports she saw a PA at that time, received a course of Keflex for UTI, IVF and was sent home. She completed her course of antibiotics but has had persistent nausea/vomiting so she presented to our ED on 5/25.  During ED visit on 5/25 patient had CT abdomen pelvis which showed no acute abnormalities but did note diffuse bony lytic lesions which patient felt were related to rickets.  Patient was referred to oncology for further workup.  In the oncology office today she complained of generalized fatigue, blood pressure was 86/62 so she was recommended to present to the hospital for further workup. Patient reports since May 11 she has had ongoing nausea, vomiting, and 1 episode of diarrhea that began today. She has had anorexia due to fear of vomiting.  No fevers at home. Patient lives alone and has no sick contacts.  In the ED initial BP was 83/60 but fluid responsive after 1L NS to 133/70.  Afebrile.  Reveal AKI, hypokalemia, hypocalcemia.  Leukocytosis of 12.6.  Review of Systems: As mentioned in the history of present illness. All other systems reviewed and are negative. Past Medical History:  Diagnosis Date   Breast cancer (HCC) 2013   left breast   Cancer (HCC) 2013   L breast   Disorders of phosphorus metabolism    rickets   Fibroadenosis  of breast    Gout 2013   Hypertension 2009   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 ER 90%, PR 1%, HER-2/neu not overexpressing.   Osteomalacia    Renal disorder    CKD early stage   Rickets    congenital   Shingles 2013   Past Surgical History:  Procedure Laterality Date   BREAST SURGERY Left 09/30/2011   Left simple mastectomy with sentinel node biopsy   CATARACT EXTRACTION, BILATERAL Bilateral 04/2015   Dr. Demetrios Finders in [place]   COLONOSCOPY  2012   DENTAL SURGERY  03/25/2023   DILATION AND CURETTAGE OF UTERUS  1982   MASTECTOMY Left 2013   with SN biopsy   TONSILLECTOMY  1953   UPPER GI ENDOSCOPY  2012   Social History:  reports that she quit smoking about 17 years ago. Her smoking use included cigarettes. She started smoking about 47 years ago. She has a 15 pack-year smoking history. She has never used smokeless tobacco. She reports current alcohol use of about 1.0 standard drink of alcohol per week. She reports that she does not use drugs.  Allergies  Allergen Reactions   Allopurinol      diarrhea   Codeine Nausea And Vomiting   Other     Pain medications Altered mental status   Shellfish Allergy Diarrhea and Nausea And Vomiting    Just when eating clams Just when eating clams Just when eating clams  Family History  Problem Relation Age of Onset   Alzheimer's disease Mother    Stroke Father    Lymphoma Brother 40       Hodgkin's Lymphoma   Cancer Brother        non hodgkins lymphoma    Heart disease Brother    Cancer Maternal Uncle 80       cancer of the esophagus   Cancer Paternal Uncle    Heart disease Maternal Grandfather    Rickets Son    Breast cancer Neg Hx     Prior to Admission medications   Medication Sig Start Date End Date Taking? Authorizing Provider  aspirin 81 MG chewable tablet Chew 81 mg by mouth daily.    [provider]   Burosumab -twza (CRYSVITA ) 30 MG/ML SOLN Inject 30 mg into the skin every 30 (thirty) days.    [provider]  cephALEXin (KEFLEX) 500 MG capsule Take 500 mg by mouth 3 (three) times daily. Patient not taking: Reported on 09/18/2023 08/31/23   [provider]  Cholecalciferol (VITAMIN D3) 50 MCG (2000 UT) TABS Take 2,000 Units by mouth daily.    [provider]  Cyanocobalamin  (VITAMIN B12 PO) Take 3,000 mcg by mouth daily.    [provider]  Ferrous Sulfate (SLOW FE) 142 (45 Fe) MG TBCR Take 1 tablet by mouth daily. Take with a little orange juice. 03/26/21   Cannady, Jolene T, NP  ibuprofen (ADVIL) 200 MG tablet Take 200 mg by mouth every 6 (six) hours as needed.    [provider]  Nebivolol  HCl 20 MG TABS TAKE 1 TABLET(20 MG) BY MOUTH DAILY 05/25/23   Cannady, Jolene T, NP  olmesartan -hydrochlorothiazide (BENICAR  HCT) 40-12.5 MG tablet Take 1 tablet by mouth daily. 05/25/23   Cannady, Jolene T, NP  ondansetron (ZOFRAN-ODT) 4 MG disintegrating tablet Take 4 mg by mouth every 8 (eight) hours as needed. 08/31/23   [provider]  rosuvastatin  (CRESTOR ) 5 MG tablet TAKE 1 TABLET(5 MG) BY MOUTH DAILY 05/25/23   Cannady, Jolene T, NP  sertraline  (ZOLOFT ) 50 MG tablet Take 1.5 tablets (75 mg total) by mouth daily. 11/11/22   Doran Galloway T, NP    Physical Exam: Vitals:   09/18/23 1253 09/18/23 1257  BP: 101/79   Pulse: 88   Resp: 20   Temp:  98.7 F (37.1 C)  TempSrc: Oral Oral  SpO2: 98%    Constitutional:  Normal appearance. Non toxic-appearing.  HENT: Head Normocephalic and atraumatic.  Mucous membranes are moist.  Eyes:  Extraocular intact. Conjunctivae normal. Pupils are equal, round, and reactive to light.  Cardiovascular: Rate and Rhythm: Normal rate and regular rhythm.  Pulmonary: Non labored, symmetric rise of chest wall.  Musculoskeletal:  Normal range of motion.  Skin: warm and dry. not jaundiced.  Neurological: No focal  deficit present. alert. Oriented. Psychiatric: Mood and Affect congruent.   Data Reviewed:  CT abdomen pelvis from 5/25 reviewed    Latest Ref Rng & Units 09/18/2023   12:30 PM 09/13/2023    3:34 PM 05/25/2023    2:19 PM  CBC  WBC 4.0 - 10.5 K/uL 12.6  12.6  8.7   Hemoglobin 12.0 - 15.0 g/dL 40.9  81.1  91.4   Hematocrit 36.0 - 46.0 % 37.0  35.9  40.4   Platelets 150 - 400 K/uL 472  380  240       Latest Ref Rng & Units 09/18/2023   12:30 PM 09/13/2023  4:05 PM 05/25/2023    2:19 PM  BMP  Glucose 70 - 99 mg/dL 161  99  88   BUN 8 - 23 mg/dL 46  48  22   Creatinine 0.44 - 1.00 mg/dL 0.96  0.45  4.09   BUN/Creat Ratio 12 - 28   19   Sodium 135 - 145 mmol/L 130  129  138   Potassium 3.5 - 5.1 mmol/L 3.3  3.5  4.3   Chloride 98 - 111 mmol/L 100  93  100   CO2 22 - 32 mmol/L 19  20  22    Calcium  8.9 - 10.3 mg/dL 8.1  9.0  9.6      Assessment and Plan:  AKI on CKD - Baseline creatinine around 1.1, elevated to 2.4 - BUN also elevated - Will start IV fluids - Suspect this will be prerenal secondary to dehydration from persistent vomiting and anorexia outpatient - Creatinine has gradually increased since 5/25 ED visit - Trend CMP in a.m.  Nausea, vomiting - Intermittent and ongoing for the last 3 weeks - Will obtain COVID/RSV/flu swabs - Obtain TSH and hemoglobin A1c - IV fluids as above - As needed antiemetics  Diffuse lytic lesions -CT abdomen pelvis 5/25: Chronic 6 mm hepatic lobe lesion, stable in size.  3 x 2.5 cm well-circumscribed fluid density lesion above the right gluteal musculature, small volume ascites containing umbilical hernia, small fat-containing right inguinal hernia.  Diffusely heterogenous axial and appendicular skeleton with multiple underlying lytic lesions, redemonstration of a chronic S3 sacral foramina fluid density lesion expanding diaphragm and is suggestive of a Tarlov cyst. - Discussed with Dr. Wilhelmenia Harada from oncology.  Reports multiple myeloma panel has  already been sent.  No further inpatient workup necessary at this time -Peripheral smear review is unremarkable WBC morphology.  Increased platelet count.  Leukocytosis - WBC 12.6, unchanged from 5 days prior - Will obtain blood cultures, urine cultures -urine culture from 5/25 growing 30,000 colonies lactobacillus -- Pt completed course of Keflex - denies any LUTS at this time - CXR unremarkable - No clinical signs or symptoms of sepsis at this time.  Low blood pressure on arrival immediately volume responsive.  Suspect WBC is reactive from dehydration.  No obvious indication for antibiotics.  Will monitor closely.  If patient becomes febrile or WBC begins rising we will start broad-spectrum antibiotics.  Thrombocytosis - Platelets 472 on arrival -Trend CBC  History of breast cancer, 2013.  In remission since 2018. - Status post left mastectomy -- Has had annual mammograms without issue  X-linked hypophosphatemia rickets - Follows [facility]inology - History of bilateral femoral stress fractures 2019, tibial fracture 2021 - Receives Cryvista injections monthly.  B12 deficiency - Continue outpatient supplements  History of anemia - Hemoglobin: - Continue home meds  Grief reaction - Continue sertraline  50mg  dose   Mixed hyperlipidemia - Continue statin  Hypertension - Hold all home meds at this time given low normal BP.  Hypokalemia - Replace as needed  Hypocalcemia - May be result of underlying malignancy, corrected calcium  is 8.3  Elevated alk phos - Baseline 180s.  Lower than that today - No further LFT elevation  Hypoalbuminemia - Suspect some degree of malnutrition - Will consult RD    Advance Care Planning: Son is HCPOA.  No advanced directive.  At this time they would like more time to discuss.  Will proceed with full code for now per their request.   Family Communication: Son Rodman Clam, Is  HCPOA and present in room at time of evaluation.   Severity of  Illness: The appropriate patient status for this patient is INPATIENT. Inpatient status is judged to be reasonable and necessary in order to provide the required intensity of service to ensure the patient's safety. The patient's presenting symptoms, physical exam findings, and initial radiographic and laboratory data in the context of their chronic comorbidities is felt to place them at high risk for further clinical deterioration. Furthermore, it is not anticipated that the patient will be medically stable for discharge from the hospital within 2 midnights of admission.   * I certify that at the point of admission it is my clinical judgment that the patient will require inpatient hospital care spanning beyond 2 midnights from the point of admission due to high intensity of service, high risk for further deterioration and high frequency of surveillance required.*  Author: Shanin Szymanowski, DO 09/18/2023 4:30 PM  For on call review [email or web address].

## 2023-09-18 NOTE — Progress Notes (Signed)
 Hematology/Oncology Consult note [phone] [phone]        REFERRING PROVIDER: Bryson Carbine, MD   CHIEF COMPLAINTS/REASON FOR VISIT:  Evaluation of bone lesions   ASSESSMENT & PLAN:   AKI (acute kidney injury) (HCC) Likely due to decreased oral intake. Patient was recommended to go to emergency room for further management.  X-linked hypophosphatemic rickets Patient follows up with endocrinology at [facility] for management. On crysvita    Bone lesion Diffusely heterogeneous axial and appendicular skeleton with multiple underlying lytic lesions Differential diagnosis includes metastatic bone disease from solid tumor, ie metastatic breast cancer; multiple myeloma etc. Check CBC, multiple myeloma panel, light chain ratio, LDH, beta-2 microglobulin. Check PET scan for further evaluation.  History of breast cancer in female Patient is status post mastectomy, she recalls being on tamoxifen  for 2 to 3 years.    Hypotension Likely due to poor oral intake.  She has leukocytosis, need infectious work up  She is going to emergency room for management.  Orders Placed This Encounter  Procedures   NM PET Image Initial (PI) Skull Base To Thigh    Standing Status:   Future    Expected Date:   09/25/2023    Expiration Date:   09/17/2024    If indicated for the ordered procedure, I authorize the administration of a radiopharmaceutical per Radiology protocol:   Yes    Preferred imaging location?:   [facility]   CBC with Differential/Platelet    Standing Status:   Future    Number of Occurrences:   1    Expected Date:   09/18/2023    Expiration Date:   09/17/2024   Comprehensive metabolic panel with GFR    Standing Status:   Future    Number of Occurrences:   1    Expected Date:   09/18/2023    Expiration Date:   09/17/2024   Multiple Myeloma Panel (SPEP&IFE w/QIG)    Standing Status:   Future    Number of Occurrences:   1    Expected Date:   09/18/2023     Expiration Date:   09/17/2024   Kappa/lambda light chains    Standing Status:   Future    Number of Occurrences:   1    Expected Date:   09/18/2023    Expiration Date:   09/17/2024   Lactate dehydrogenase    Standing Status:   Future    Number of Occurrences:   1    Expected Date:   09/18/2023    Expiration Date:   09/17/2024   Beta 2 microglobulin, serum    Standing Status:   Future    Number of Occurrences:   1    Expected Date:   09/18/2023    Expiration Date:   09/17/2024   Technologist smear review    Standing Status:   Future    Number of Occurrences:   1    Expected Date:   09/18/2023    Expiration Date:   09/17/2024    Clinical information::   lytic bone lesions.    All questions were answered. The patient knows to call the clinic with any problems, questions or concerns.  Timmy Forbes, MD, PhD [facility] Hematology Oncology 09/18/2023   HISTORY OF PRESENTING ILLNESS:   Felicia Robinson is a  78 y.o.  female with PMH listed below was seen in consultation at the request of  Bryson Carbine, MD  for evaluation of bone lesions.   She reports  that since early May, she has experienced severe abdominal pain, distention, diarrhea, and fainting episodes. She was diagnosed with a UTI, treated with hydration and antibiotics, and showed significant improvement. However, she returned to the ER a few days later with persistent symptoms including nausea, vomiting, lack of appetite, and extreme fatigue. She has experienced low blood pressure and fainting episodes since then. In the emergency room on 09/13/2023. A CT scan revealed diffuse bone lesions. She has a history of left breast cancer diagnosed in 2013, which was stage I and treated with a mastectomy and tamoxifen  for three years.   She has a congenital bone disease, XLH hypophosphatemic rickets, managed by Dr. Angela Barban at [facility] endocrinology. She is currently on crysvita  . There is a concern whether the rickets could be related to the bone  lesions.   She has not been taking her blood pressure medication,  regularly due to low blood pressure. She has been able to eat and drink more in the last 36-48 hours  She has not had a fever, but has experienced chills.  Patient has chronic pain.   MEDICAL HISTORY:  Past Medical History:  Diagnosis Date   Breast cancer (HCC) 2013   left breast   Cancer (HCC) 2013   L breast   Disorders of phosphorus metabolism    rickets   Fibroadenosis of breast    Gout 2013   Hypertension 2009   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 ER 90%, PR 1%, HER-2/neu not overexpressing.   Osteomalacia    Renal disorder    CKD early stage   Rickets    congenital   Shingles 2013    SURGICAL HISTORY: Past Surgical History:  Procedure Laterality Date   BREAST SURGERY Left 09/30/2011   Left simple mastectomy with sentinel node biopsy   CATARACT EXTRACTION, BILATERAL Bilateral 04/2015   Dr. Demetrios Finders in [place]   COLONOSCOPY  2012   DENTAL SURGERY  03/25/2023   DILATION AND CURETTAGE OF UTERUS  1982   MASTECTOMY Left 2013   with SN biopsy   TONSILLECTOMY  1953   UPPER GI ENDOSCOPY  2012    SOCIAL HISTORY: Social History   Socioeconomic History   Marital status: Widowed    Spouse name: Sonny   Number of children: 1   Years of education: some college   Highest education level: 12th grade  Occupational History   Occupation: Retired  Tobacco Use   Smoking status: Former    Current packs/day: 0.00    Average packs/day: 0.5 packs/day for 30.0 years (15.0 ttl pk-yrs)    Types: Cigarettes    Start date: 04/21/1976    Quit date: 04/21/2006    Years since quitting: 17.4   Smokeless tobacco: Never  Vaping Use   Vaping status: Never Used  Substance and Sexual Activity   Alcohol use: Yes    Alcohol/week: 1.0 standard drink of alcohol    Types: 1 Glasses of wine per week    Comment: occ    Drug use: No   Sexual activity: Not Currently  Other Topics Concern   Not on file  Social History Narrative   Not on file   Social Drivers of Health   Financial Resource Strain: Low Risk  (09/13/2023)   Received from [facility]   Overall Financial Resource Strain (CARDIA)    Difficulty of Paying Living Expenses: Not hard at all  Food Insecurity: No Food Insecurity (09/18/2023)   Hunger Vital Sign    Worried About Running Out of Food in the Last Year: Never true    Ran Out of Food in the Last Year: Never true  Transportation Needs: No Transportation Needs (09/18/2023)   PRAPARE - Administrator, Civil Service (Medical): No    Lack of Transportation (Non-Medical): No  Physical Activity: Insufficiently Active (08/31/2023)   Exercise Vital Sign    Days of Exercise per Week: 3 days    Minutes of Exercise per Session: 20 min  Stress: No Stress Concern Present (08/31/2023)   [employer] of Occupational Health - Occupational Stress Questionnaire    Feeling of Stress : Not at all  Social Connections: Socially Integrated (08/31/2023)   Social Connection and Isolation Panel [NHANES]    Frequency of Communication with Friends and Family: More than three times a week    Frequency of Social Gatherings with Friends and Family: More than three times a week    Attends Religious Services: More than 4 times per year    Active Member of [employer] or Organizations: Yes    Attends Banker Meetings: Never    Marital Status: Married  Catering manager Violence: Not At Risk (09/18/2023)   Humiliation, Afraid, Rape, and Kick questionnaire    Fear of Current or Ex-Partner: No    Emotionally Abused: No    Physically Abused: No    Sexually Abused: No    FAMILY HISTORY: Family History  Problem Relation Age of Onset   Alzheimer's disease Mother    Stroke Father    Lymphoma Brother 6       Hodgkin's Lymphoma   Cancer Brother        non hodgkins lymphoma     Heart disease Brother    Cancer Maternal Uncle 13       cancer of the esophagus   Cancer Paternal Uncle    Heart disease Maternal Grandfather    Rickets Son    Breast cancer Neg Hx     ALLERGIES:  is allergic to allopurinol , codeine, other, and shellfish allergy.  MEDICATIONS:  No current facility-administered medications for this visit.   No current outpatient medications on file.   Facility-Administered Medications Ordered in Other Visits  Medication Dose Route Frequency Provider Last Rate Last Admin   0.9 %  sodium chloride  infusion   Intravenous Continuous Dezii, Alexandra, DO 125 mL/hr at 09/18/23 1824 New Bag at 09/18/23 1824   acetaminophen (TYLENOL) tablet 650 mg  650 mg Oral Q6H PRN Dezii, Alexandra, DO       heparin injection 5,000 Units  5,000 Units Subcutaneous Q8H Dezii, Alexandra, DO       hydrALAZINE  (APRESOLINE ) injection 10 mg  10 mg Intravenous Q6H PRN Dezii, Alexandra, DO       HYDROcodone-acetaminophen (NORCO/VICODIN) 5-325 MG per tablet 1 tablet  1 tablet Oral Q6H PRN Dezii, Alexandra, DO       metoCLOPramide (REGLAN) injection 5 mg  5 mg Intravenous Q8H PRN Dezii, Alexandra, DO   5 mg at 09/18/23 1804   Muscle Rub CREA 1 Application  1 Application Topical PRN Dezii, Alexandra, DO       ondansetron (ZOFRAN-ODT) disintegrating tablet 4 mg  4 mg Oral Q8H PRN Dezii, Alexandra, DO       potassium chloride SA (KLOR-CON M) CR tablet 40 mEq  40 mEq Oral Once Dezii, Alexandra, DO       [START ON 09/19/2023]  sertraline  (ZOLOFT ) tablet 50 mg  50 mg Oral Daily Dezii, Alexandra, DO       sodium chloride  (OCEAN) 0.65 % nasal spray 1 spray  1 spray Each Nare PRN Dezii, Alexandra, DO        Review of Systems  Constitutional:  Positive for appetite change, fatigue and unexpected weight change. Negative for chills and fever.  HENT:   Negative for hearing loss and voice change.   Eyes:  Negative for eye problems.  Respiratory:  Negative for chest tightness and cough.    Cardiovascular:  Negative for chest pain.  Gastrointestinal:  Positive for abdominal pain and nausea. Negative for abdominal distention and blood in stool.  Endocrine: Negative for hot flashes.  Genitourinary:  Negative for difficulty urinating and frequency.   Musculoskeletal:  Positive for arthralgias.  Skin:  Negative for itching and rash.  Neurological:  Negative for extremity weakness.  Hematological:  Negative for adenopathy.  Psychiatric/Behavioral:  Negative for confusion.    PHYSICAL EXAMINATION: ECOG PERFORMANCE STATUS: 2 - Symptomatic, <50% confined to bed Vitals:   09/18/23 1129 09/18/23 1151  BP: (!) 83/60 (!) 86/62  Pulse: 86   Resp: 18   Temp: 99.1 F (37.3 C)   SpO2: 96%    Filed Weights   09/18/23 1129  Weight: 116 lb (52.6 kg)    Physical Exam Constitutional:      General: She is not in acute distress.    Comments: Elderly female with frail appearance  HENT:     Head: Normocephalic and atraumatic.  Eyes:     General: No scleral icterus. Cardiovascular:     Rate and Rhythm: Normal rate and regular rhythm.     Heart sounds: Normal heart sounds.  Pulmonary:     Effort: Pulmonary effort is normal. No respiratory distress.     Breath sounds: No wheezing.  Abdominal:     General: Bowel sounds are normal. There is no distension.     Palpations: Abdomen is soft.  Musculoskeletal:        General: No deformity. Normal range of motion.     Cervical back: Normal range of motion and neck supple.  Skin:    General: Skin is warm and dry.     Findings: No erythema or rash.  Neurological:     Mental Status: She is alert and oriented to person, place, and time. Mental status is at baseline.     Cranial Nerves: No cranial nerve deficit.  Psychiatric:        Mood and Affect: Mood normal.     LABORATORY DATA:  I have reviewed the data as listed    Latest Ref Rng & Units 09/18/2023   12:30 PM 09/13/2023    3:34 PM 05/25/2023    2:19 PM  CBC  WBC 4.0 - 10.5  K/uL 12.6  12.6  8.7   Hemoglobin 12.0 - 15.0 g/dL 16.1  09.6  04.5   Hematocrit 36.0 - 46.0 % 37.0  35.9  40.4   Platelets 150 - 400 K/uL 472  380  240       Latest Ref Rng & Units 09/18/2023   12:30 PM 09/13/2023    4:05 PM 05/25/2023    2:19 PM  CMP  Glucose 70 - 99 mg/dL 409  99  88   BUN 8 - 23 mg/dL 46  48  22   Creatinine 0.44 - 1.00 mg/dL 8.11  9.14  7.82   Sodium 135 - 145 mmol/L 130  129  138   Potassium 3.5 - 5.1 mmol/L 3.3  3.5  4.3   Chloride 98 - 111 mmol/L 100  93  100   CO2 22 - 32 mmol/L 19  20  22    Calcium  8.9 - 10.3 mg/dL 8.1  9.0  9.6   Total Protein 6.5 - 8.1 g/dL 7.0  7.8  7.3   Total Bilirubin 0.0 - 1.2 mg/dL 0.8  0.8  0.3   Alkaline Phos 38 - 126 U/L 152  152  180   AST 15 - 41 U/L 21  25  17    ALT 0 - 44 U/L 15  17  10        RADIOGRAPHIC STUDIES: I have personally reviewed the radiological images as listed and agreed with the findings in the report. DG Chest Port 1 View Result Date: 09/18/2023 CLINICAL DATA:  Weakness and hypotension. EXAM: PORTABLE CHEST 1 VIEW COMPARISON:  11/03/2011 FINDINGS: Shallow inspiration. Linear atelectasis in the lung bases. Mild cardiac enlargement. No vascular congestion, edema, or consolidation. No pleural effusion or pneumothorax. Mediastinal contours appear intact. Degenerative changes in the spine and shoulders. IMPRESSION: Shallow inspiration with atelectasis in the lung bases. Electronically Signed   By: Boyce Byes M.D.   On: 09/18/2023 18:05   CT ABDOMEN PELVIS WO CONTRAST Result Date: 09/13/2023 CLINICAL DATA:  Abdominal pain, acute, nonlocalized EXAM: CT ABDOMEN AND PELVIS WITHOUT CONTRAST TECHNIQUE: Multidetector CT imaging of the abdomen and pelvis was performed following the standard protocol without IV contrast. RADIATION DOSE REDUCTION: This exam was performed according to the departmental dose-optimization program which includes automated exposure control, adjustment of the mA and/or kV according to patient  size and/or use of iterative reconstruction technique. COMPARISON:  PET CT 11/05/2011 FINDINGS: Lower chest: No acute abnormality.  Small hiatal hernia. Hepatobiliary: Stable chronic 6 mm hepatic lobe lesion (2:21). No gallstones, gallbladder wall thickening, or pericholecystic fluid. No biliary dilatation. Pancreas: No focal lesion. Normal pancreatic contour. No surrounding inflammatory changes. No main pancreatic ductal dilatation. Spleen: Normal in size without focal abnormality. Adrenals/Urinary Tract: No adrenal nodule bilaterally. No nephrolithiasis and no hydronephrosis. Indeterminate 8 mm hyperdense lesion of the left kidney (2:19)-given hyperdensity finding likely represents a proteinaceous or hemorrhagic cyst-no further follow-up indicated. Subcentimeter hypodensities are too small to characterize-no further follow-up indicated. No ureterolithiasis or hydroureter. The urinary bladder is unremarkable. Stomach/Bowel: Stomach is within normal limits. No evidence of bowel wall thickening or dilatation. The appendix is not definitely identified with no inflammatory changes in the right lower quadrant to suggest acute appendicitis. Vascular/Lymphatic: No abdominal aorta or iliac aneurysm. Mild atherosclerotic plaque of the aorta and its branches. No abdominal, pelvic, or inguinal lymphadenopathy. Reproductive: Uterus and bilateral adnexa are unremarkable. Other: Vsimple free fluid. No intraperitoneal free gas. No organized fluid collection. Musculoskeletal: Interval development of a 3 x 2.5 cm well-circumscribed fluid density lesion just deep of the right gluteal musculature and lateral to the ischial tuberosity (2:77). Small volume ascites containing umbilical hernia. Small fat containing right inguinal hernia. Status post left mastectomy. Diffusely heterogeneous axial and appendicular skeleton with multiple underlying lytic lesions. No acute displaced fracture. Multilevel degenerative changes of the spine.  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 Bilateral hip degenerative changes. IMPRESSION: 1. Small hiatal hernia. 2. Small to moderate volume simple ascites. 3. Diffusely heterogeneous axial and appendicular skeleton with multiple underlying lytic lesions. Underlying metastatic lesions versus multiple myeloma not excluded. 4. Interval  development of an indeterminate 3 cm well-circumscribed fluid density lesion just deep to the right gluteal musculature and lateral to the distal tuberosity. 5. Otherwise limited evaluation on this noncontrast study. 6.  Aortic Atherosclerosis (ICD10-I70.0). Electronically Signed   By: Morgane  Naveau M.D.   On: 09/13/2023 17:25

## 2023-09-18 NOTE — ED Triage Notes (Signed)
 First nurse note: Pt sent ot ED from cancer center due to hypotension and possible kidney infection.

## 2023-09-18 NOTE — Assessment & Plan Note (Signed)
 Diffusely heterogeneous axial and appendicular skeleton with multiple underlying lytic lesions Differential diagnosis includes metastatic bone disease from solid tumor, ie metastatic breast cancer; multiple myeloma etc. Check CBC, multiple myeloma panel, light chain ratio, LDH, beta-2 microglobulin. Check PET scan for further evaluation.

## 2023-09-18 NOTE — Assessment & Plan Note (Addendum)
 Likely due to poor oral intake.  She has leukocytosis, need infectious work up  She is going to emergency room for management.

## 2023-09-18 NOTE — Assessment & Plan Note (Signed)
 Likely due to decreased oral intake. Patient was recommended to go to emergency room for further management.

## 2023-09-18 NOTE — Assessment & Plan Note (Signed)
 Patient is status post mastectomy, she recalls being on tamoxifen  for 2 to 3 years.

## 2023-09-19 DIAGNOSIS — N179 Acute kidney failure, unspecified: Secondary | ICD-10-CM | POA: Diagnosis not present

## 2023-09-19 DIAGNOSIS — E860 Dehydration: Secondary | ICD-10-CM | POA: Diagnosis not present

## 2023-09-19 LAB — COMPREHENSIVE METABOLIC PANEL WITH GFR
ALT: 11 U/L (ref 0–44)
AST: 17 U/L (ref 15–41)
Albumin: 2.3 g/dL — ABNORMAL LOW (ref 3.5–5.0)
Alkaline Phosphatase: 101 U/L (ref 38–126)
Anion gap: 8 (ref 5–15)
BUN: 37 mg/dL — ABNORMAL HIGH (ref 8–23)
CO2: 19 mmol/L — ABNORMAL LOW (ref 22–32)
Calcium: 7.8 mg/dL — ABNORMAL LOW (ref 8.9–10.3)
Chloride: 108 mmol/L (ref 98–111)
Creatinine, Ser: 1.74 mg/dL — ABNORMAL HIGH (ref 0.44–1.00)
GFR, Estimated: 30 mL/min — ABNORMAL LOW (ref >60)
Glucose, Bld: 96 mg/dL (ref 70–99)
Potassium: 3.7 mmol/L (ref 3.5–5.1)
Sodium: 135 mmol/L (ref 135–145)
Total Bilirubin: 0.9 mg/dL (ref 0.0–1.2)
Total Protein: 5.2 g/dL — ABNORMAL LOW (ref 6.5–8.1)

## 2023-09-19 LAB — CBC WITH DIFFERENTIAL/PLATELET
Abs Immature Granulocytes: 0.03 10*3/uL (ref 0.00–0.07)
Basophils Absolute: 0 10*3/uL (ref 0.0–0.1)
Basophils Relative: 0 %
Eosinophils Absolute: 0.2 10*3/uL (ref 0.0–0.5)
Eosinophils Relative: 2 %
HCT: 31.3 % — ABNORMAL LOW (ref 36.0–46.0)
Hemoglobin: 10.3 g/dL — ABNORMAL LOW (ref 12.0–15.0)
Immature Granulocytes: 0 %
Lymphocytes Relative: 11 %
Lymphs Abs: 1 10*3/uL (ref 0.7–4.0)
MCH: 29.1 pg (ref 26.0–34.0)
MCHC: 32.9 g/dL (ref 30.0–36.0)
MCV: 88.4 fL (ref 80.0–100.0)
Monocytes Absolute: 0.7 10*3/uL (ref 0.1–1.0)
Monocytes Relative: 8 %
Neutro Abs: 6.8 10*3/uL (ref 1.7–7.7)
Neutrophils Relative %: 79 %
Platelets: 323 10*3/uL (ref 150–400)
RBC: 3.54 MIL/uL — ABNORMAL LOW (ref 3.87–5.11)
RDW: 13 % (ref 11.5–15.5)
WBC: 8.6 10*3/uL (ref 4.0–10.5)
nRBC: 0 % (ref 0.0–0.2)

## 2023-09-19 LAB — MAGNESIUM: Magnesium: 1.5 mg/dL — ABNORMAL LOW (ref 1.7–2.4)

## 2023-09-19 LAB — TSH: TSH: 1.194 u[IU]/mL (ref 0.350–4.500)

## 2023-09-19 LAB — HEMOGLOBIN A1C
Hgb A1c MFr Bld: 5.6 % (ref 4.8–5.6)
Mean Plasma Glucose: 114.02 mg/dL

## 2023-09-19 LAB — BETA 2 MICROGLOBULIN, SERUM: Beta-2 Microglobulin: 6.4 mg/L — ABNORMAL HIGH (ref 0.6–2.4)

## 2023-09-19 LAB — PHOSPHORUS: Phosphorus: 2.3 mg/dL — ABNORMAL LOW (ref 2.5–4.6)

## 2023-09-19 MED ORDER — POTASSIUM & SODIUM PHOSPHATES 280-160-250 MG PO PACK
2.0000 | PACK | ORAL | Status: AC
  Administered 2023-09-19 – 2023-09-20 (×3): 2 via ORAL
  Filled 2023-09-19 (×3): qty 2

## 2023-09-19 MED ORDER — IPRATROPIUM-ALBUTEROL 0.5-2.5 (3) MG/3ML IN SOLN
3.0000 mL | Freq: Four times a day (QID) | RESPIRATORY_TRACT | Status: DC | PRN
  Administered 2023-09-19: 3 mL via RESPIRATORY_TRACT
  Filled 2023-09-19: qty 3

## 2023-09-19 MED ORDER — MAGNESIUM SULFATE 4 GM/100ML IV SOLN
4.0000 g | Freq: Once | INTRAVENOUS | Status: AC
  Administered 2023-09-19: 4 g via INTRAVENOUS
  Filled 2023-09-19: qty 100

## 2023-09-19 NOTE — Patient Instructions (Signed)
 Be Involved in Caring For Your Health:  Taking Medications When medications are taken as directed, they can greatly improve your health. But if they are not taken as prescribed, they may not work. In some cases, not taking them correctly can be harmful. To help ensure your treatment remains effective and safe, understand your medications and how to take them. Bring your medications to each visit for review by your provider.  Your lab results, notes, and after visit summary will be available on My Chart. We strongly encourage you to use this feature. If lab results are abnormal the clinic will contact you with the appropriate steps. If the clinic does not contact you assume the results are satisfactory. You can always view your results on My Chart. If you have questions regarding your health or results, please contact the clinic during office hours. You can also ask questions on My Chart.  We at [facility] are grateful that you chose us  to provide your care. We strive to provide evidence-based and compassionate care and are always looking for feedback. If you get a survey from the clinic please complete this so we can hear your opinions.  Urinary Tract Infection, Female A urinary tract infection (UTI) is an infection in your urinary tract. The urinary tract is made up of organs that make, store, and get rid of pee (urine) in your body. These organs include: The kidneys. The ureters. The bladder. The urethra. What are the causes? Most UTIs are caused by germs called bacteria. They may be in or near your genitals. These germs grow and cause swelling in your urinary tract. What increases the risk? You're more likely to get a UTI if: You're a female. The urethra is shorter in females than in males. You have a soft tube called a catheter that drains your pee. You can't control when you pee or poop. You have trouble peeing because of: A kidney stone. A urinary blockage. A nerve  condition that affects your bladder. Not getting enough to drink. You're sexually active. You use a birth control inside your vagina, like spermicide. You're pregnant. You have low levels of the hormone estrogen in your body. You're an older adult. You're also more likely to get a UTI if you have other health problems. These may include: Diabetes. A weak immune system. Your immune system is your body's defense system. Sickle cell disease. Injury of the spine. What are the signs or symptoms? Symptoms may include: Needing to pee right away. Peeing small amounts often. Pain or burning when you pee. Blood in your pee. Pee that smells bad or odd. Pain in your belly or lower back. You may also: Feel confused. This may be the first symptom in older adults. Vomit. Not feel hungry. Feel tired or easily annoyed. Have a fever or chills. How is this diagnosed? A UTI is diagnosed based on your medical history and an exam. You may also have other tests. These may include: Pee tests. Blood tests. Tests for sexually transmitted infections (STIs). If you've had more than one UTI, you may need to have imaging studies done to find out why you keep getting them. How is this treated? A UTI can be treated by: Taking antibiotics or other medicines. Drinking enough fluid to keep your pee pale yellow. In rare cases, a UTI can cause a very bad condition called sepsis. Sepsis may be treated in the hospital. Follow these instructions at home: Medicines Take your medicines only as told by your  health care provider. If you were given antibiotics, take them as told by your provider. Do not stop taking them even if you start to feel better. General instructions Make sure you: Pee often and fully. Do not hold your pee for a long time. Wipe from front to back after you pee or poop. Use each tissue only once when you wipe. Pee after you have sex. Do not douche or use sprays or powders in your genital  area. Contact a health care provider if: Your symptoms don't get better after 1-2 days of taking antibiotics. Your symptoms go away and then come back. You have a fever or chills. You vomit or feel like you may vomit. Get help right away if: You have very bad pain in your back or lower belly. You faint. This information is not intended to replace advice given to you by your health care provider. Make sure you discuss any questions you have with your health care provider. Document Revised: 11/13/2022 Document Reviewed: 07/11/2022 Elsevier Patient Education  2024 [employer].

## 2023-09-19 NOTE — Plan of Care (Signed)

## 2023-09-19 NOTE — TOC Initial Note (Signed)
 Transition of Care [facility]) - Initial/Assessment Note    Patient Details  Name: Felicia Robinson MRN: [id number] Date of Birth: May 14, 1945  Transition of Care [facility]) CM/SW Contact:    Alexandra Ice, RN Phone Number: 09/19/2023, 3:07 PM  Clinical Narrative:                  Attempted to meet with patient to discuss outpatient therapy, patient was in the restroom.        Patient Goals and CMS Choice            Expected Discharge Plan and Services                                              Prior Living Arrangements/Services                       Activities of Daily Living   ADL Screening (condition at time of admission) Independently performs ADLs?: Yes (appropriate for developmental age) Is the patient deaf or have difficulty hearing?: Yes Does the patient have difficulty seeing, even when wearing glasses/contacts?: No Does the patient have difficulty concentrating, remembering, or making decisions?: No  Permission Sought/Granted                  Emotional Assessment              Admission diagnosis:  Dehydration [E86.0] AKI (acute kidney injury) (HCC) [N17.9] Patient Active Problem List   Diagnosis Date Noted   AKI (acute kidney injury) (HCC) 09/18/2023   Bone lesion 09/18/2023   Hypotension 09/18/2023   Osteoporosis 05/05/2021   Vitamin D  deficiency 03/25/2021   Anemia 03/25/2021   CKD (chronic kidney disease) stage 3, GFR 30-59 ml/min (HCC) 03/24/2021   B12 deficiency 02/02/2021   Mixed hyperlipidemia 02/02/2021   Estrogen deficiency 03/02/2020   Essential hypertension 01/29/2015   Controlled gout 01/29/2015   X-linked hypophosphatemic rickets 01/29/2015   Grief reaction 01/29/2015   Gait instability 01/29/2015   Recurrent falls 01/29/2015   Acquired scoliosis 01/29/2015   History of breast cancer in female 09/08/2012   History of shingles 12/06/2011   PCP:  Lemar Pyles, NP Pharmacy:   [facility] - [place], [place] - [address] [place] [address] Phone: [phone] Fax: [phone]     Social Drivers of Health (SDOH) Social History: SDOH Screenings   Food Insecurity: No Food Insecurity (09/19/2023)  Housing: Low Risk  (09/19/2023)  Transportation Needs: Unknown (09/19/2023)  Utilities: Not At Risk (09/19/2023)  Alcohol Screen: Low Risk  (08/31/2023)  Depression (PHQ2-9): Low Risk  (08/31/2023)  Financial Resource Strain: Low Risk  (09/13/2023)   Received from [facility]  Physical Activity: Insufficiently Active (08/31/2023)  Social Connections: Socially Integrated (09/19/2023)  Stress: No Stress Concern Present (08/31/2023)  Tobacco Use: Medium Risk (09/18/2023)  Health Literacy: Adequate Health Literacy (09/18/2023)   SDOH Interventions:     Readmission Risk Interventions     No data to display

## 2023-09-19 NOTE — Evaluation (Signed)
 Physical Therapy Evaluation Patient Details Name: Felicia Robinson MRN: [id number] DOB: Jan 16, 1946 Today's Date: 09/19/2023  History of Present Illness  Pt is a 78 y.o. female with medical history significant of stage IIIa CKD, X-linked hypophosphatemic rickets follows with [facility], osteoporosis, anemia, B12 deficiency, grief reaction, mixed hyperlipidemia, vitamin D  deficiency, history of breast cancer, hypertension.  MD assessment includes: AKI on CKD, N&V, diffuse lytic lesions, leukocytosis, thrombocytosis, hypokalemia, hypocalcemia, and hypoalbuminemia.   Clinical Impression  Pt was pleasant and motivated to participate during the session and put forth good effort throughout. Pt required extra time and effort with functional tasks but no physical assistance.  Pt was generally steady with standing activities with a SPC with no overt LOB and was able to amb a self-selected max distance of 150 feet before becoming fatigued and minimally SOB and needing to return to sitting.  Pt reported no adverse symptoms during the session with SpO2 and HR WNL throughout on room air.  Pt reported one fall from slipping in the last 6 months as well as feeling generally weaker than at baseline.  Pt will benefit from continued PT services upon discharge to safely address deficits listed in patient problem list for decreased caregiver assistance and eventual return to PLOF.           If plan is discharge home, recommend the following: A little help with walking and/or transfers;Assistance with cooking/housework;Assist for transportation   Can travel by private vehicle        Equipment Recommendations None recommended by PT  Recommendations for Other Services       Functional Status Assessment Patient has had a recent decline in their functional status and demonstrates the ability to make significant improvements in function in a reasonable and predictable amount of time.     Precautions / Restrictions  Precautions Precautions: Fall Restrictions Weight Bearing Restrictions Per Provider Order: No      Mobility  Bed Mobility Overal bed mobility: Modified Independent             General bed mobility comments: Extra time and effort but no physical assistance needed    Transfers Overall transfer level: Needs assistance Equipment used: Straight cane Transfers: Sit to/from Stand Sit to Stand: Supervision           General transfer comment: Good eccentric and concentric control and stability    Ambulation/Gait Ambulation/Gait assistance: Supervision Gait Distance (Feet): 150 Feet Assistive device: Straight cane Gait Pattern/deviations: Step-through pattern, Decreased step length - right, Decreased step length - left Gait velocity: decreased     General Gait Details: Mildly reduced cadence with short B step length but generally steady with no overt LOB  Stairs            Wheelchair Mobility     Tilt Bed    Modified Rankin (Stroke Patients Only)       Balance Overall balance assessment: Needs assistance   Sitting balance-Leahy Scale: Good     Standing balance support: Single extremity supported, During functional activity Standing balance-Leahy Scale: Good                               Pertinent Vitals/Pain Pain Assessment Pain Assessment: No/denies pain    Home Living Family/patient expects to be discharged to:: Private residence Living Arrangements: Alone Available Help at Discharge: Family;Friend(s);Available PRN/intermittently Type of Home: House Home Access: Level entry       Home Layout:  One level Home Equipment: Grab bars - tub/shower;Grab bars - toilet;Cane - single point;Shower seat - built in      Prior Function Prior Level of Function : Independent/Modified Independent;History of Falls (last six months)             Mobility Comments: Ind amb in the home without an AD, SPC in the community, one fall in the last  6 months from slipping ADLs Comments: Ind with ADLs     Extremity/Trunk Assessment   Upper Extremity Assessment Upper Extremity Assessment: Overall WFL for tasks assessed    Lower Extremity Assessment Lower Extremity Assessment: Generalized weakness       Communication   Communication Communication: No apparent difficulties    Cognition Arousal: Alert Behavior During Therapy: WFL for tasks assessed/performed   PT - Cognitive impairments: No apparent impairments                         Following commands: Intact       Cueing Cueing Techniques: Verbal cues     General Comments      Exercises     Assessment/Plan    PT Assessment Patient needs continued PT services  PT Problem List Decreased strength;Decreased activity tolerance;Decreased balance;Decreased mobility       PT Treatment Interventions DME instruction;Gait training;Functional mobility training;Therapeutic activities;Therapeutic exercise;Balance training;Patient/family education    PT Goals (Current goals can be found in the Care Plan section)  Acute Rehab PT Goals Patient Stated Goal: To get stronger PT Goal Formulation: With patient Time For Goal Achievement: 10/02/23 Potential to Achieve Goals: Good    Frequency Min 2X/week     Co-evaluation               AM-PAC PT "6 Clicks" Mobility  Outcome Measure Help needed turning from your back to your side while in a flat bed without using bedrails?: A Little Help needed moving from lying on your back to sitting on the side of a flat bed without using bedrails?: A Little Help needed moving to and from a bed to a chair (including a wheelchair)?: A Little Help needed standing up from a chair using your arms (e.g., wheelchair or bedside chair)?: A Little Help needed to walk in hospital room?: A Little Help needed climbing 3-5 steps with a railing? : A Little 6 Click Score: 18    End of Session Equipment Utilized During Treatment:  Gait belt Activity Tolerance: Patient tolerated treatment well Patient left: in bed;with call bell/phone within reach;with bed alarm set (Pt recently back to bed from sitting in chair, declined back to chair) Nurse Communication: Mobility status PT Visit Diagnosis: History of falling (Z91.81);Difficulty in walking, not elsewhere classified (R26.2);Muscle weakness (generalized) (M62.81)    Time: 0981-1914 PT Time Calculation (min) (ACUTE ONLY): 27 min   Charges:   PT Evaluation $PT Eval Moderate Complexity: 1 Mod   PT General Charges $$ ACUTE PT VISIT: 1 Visit     D. Madalyn Scarce PT, DPT 09/19/23, 12:15 PM

## 2023-09-19 NOTE — Progress Notes (Signed)
 Mobility Specialist - Progress Note   09/19/23 0857  Mobility  Activity Ambulated with assistance in hallway;Stood at bedside;Dangled on edge of bed  Level of Assistance Standby assist, set-up cues, supervision of patient - no hands on  Assistive Device Cane  Distance Ambulated (ft) 200 ft  Activity Response Tolerated well  Mobility Referral Yes  Mobility visit 1 Mobility  Mobility Specialist Start Time (ACUTE ONLY) [id number]  Mobility Specialist Stop Time (ACUTE ONLY) 0849  Mobility Specialist Time Calculation (min) (ACUTE ONLY) 25 min   Pt supine in bed on RA upon arrival. Pt completes bed mobility, STS, and ambulates in hallway SBA with no LOB noted. Pt left in recliner with needs in reach and chair alarm activated.   Wash Hack  Mobility Specialist  09/19/23 8:58 AM

## 2023-09-19 NOTE — Progress Notes (Signed)
 PROGRESS NOTE    Felicia Robinson  WUJ:[id number] DOB: 05/03/1945 DOA: 09/18/2023 PCP: Lemar Pyles, NP  Chief Complaint  Patient presents with   Hypotension    Hospital Course:  Felicia Robinson is a 78 y.o. female with medical history significant of stage IIIa CKD, X-linked hypophosphatemic rickets follows with [facility], osteoporosis, anemia, B12 deficiency, grief reaction, mixed hyperlipidemia, vitamin D  deficiency, history of breast cancer, hypertension. On May 11, pt reports she syncopized at home and was brought to ED in [facility]. She reports she saw a PA at that time, received a course of Keflex for UTI, IVF and was sent home. She completed her course of antibiotics but has had persistent nausea/vomiting so she presented to our ED on 5/25.  During ED visit on 5/25 patient had CT abdomen pelvis which showed no acute abnormalities but did note diffuse bony lytic lesions which patient felt were related to rickets.  Patient was referred to oncology for further workup.  In the oncology office today she complained of generalized fatigue, blood pressure was 86/62 so she was recommended to present to the hospital for further workup. Patient reports since May 11 she has had ongoing nausea, vomiting, and 1 episode of diarrhea that began today. She has had anorexia due to fear of vomiting.  No fevers at home. Patient lives alone and has no sick contacts.  In the ED initial BP was 83/60 but fluid responsive after 1L NS to 133/70.  Afebrile.  Reveal AKI, hypokalemia, hypocalcemia.  Leukocytosis of 12.6.  Subjective: This morning patient reports that she is feeling much better.  She is tolerating her diet.  Was able to work with physical therapy, less fatigue.  No nausea.   Objective: Vitals:   09/18/23 2008 09/19/23 0358 09/19/23 0907 09/19/23 1445  BP: (!) 148/87 131/73 (!) 128/90 128/65  Pulse: 83 73 87 90  Resp: 20 20 16    Temp: 98.3 F (36.8 C) 97.6 F (36.4 C) 98.2 F (36.8  C) 98.4 F (36.9 C)  TempSrc: Oral Oral    SpO2: 97% 96% 96% 95%    Intake/Output Summary (Last 24 hours) at 09/19/2023 1504 Last data filed at 09/19/2023 1426 Gross per 24 hour  Intake 480 ml  Output 350 ml  Net 130 ml   There were no vitals filed for this visit.  Examination: General exam: Appears calm and comfortable, NAD  Respiratory system: No work of breathing, symmetric chest wall expansion Cardiovascular system: S1 & S2 heard, RRR.  Gastrointestinal system: Abdomen is nondistended, soft and nontender.  Neuro: Alert and oriented. No focal neurological deficits. Extremities: Symmetric, expected ROM Skin: No rashes, lesions Psychiatry: Demonstrates appropriate judgement and insight. Mood & affect appropriate for situation.   Assessment & Plan:  AKI on CKD - Baseline creatinine around 1.1, elevated to 2.4 on arrival, now downtrending to 1.67 - Continue IV fluids for 24 hours - Suspect this is prerenal injury from dehydration given persistent vomiting and anorexia outpatient - Continue to trend CMP - Avoid nephrotoxic medications - Renally dosed with a creatinine clearance of 18 when needed   Nausea, vomiting - Intermittent and ongoing for the last 3 weeks - COVID/RSV/flu negative. - TSH and hemoglobin A1c unremarkable - Unclear etiology appears to be resolving - IV fluids as above - As needed antiemetics - Currently tolerating her diet   Diffuse lytic lesions -CT abdomen pelvis 5/25: Chronic 6 mm hepatic lobe lesion, stable in size.  3 x 2.5 cm well-circumscribed fluid  density lesion above the right gluteal musculature, small volume ascites containing umbilical hernia, small fat-containing right inguinal hernia.  Diffusely heterogenous axial and appendicular skeleton with multiple underlying lytic lesions, redemonstration of a chronic S3 sacral foramina fluid density lesion expanding diaphragm and is suggestive of a Tarlov cyst. - Discussed with Dr. Wilhelmenia Harada from oncology.   Reports multiple myeloma panel has already been sent.   -Peripheral smear review is unremarkable WBC morphology.  Increased platelet count. - Patient will need PET scan outpatient. - Will pursue chest CTA this admission, likely tomorrow if kidney function can tolerate   Leukocytosis - WBC 12.6, unchanged from 5 days prior - Cultures obtained, will follow. - Low suspicion for infection.  Patient has been afebrile.  Leukocytosis has resolved without intervention - Suspect this was reactive from dehydration -urine culture from 5/25 growing 30,000 colonies lactobacillus -- Pt completed course of Keflex - denies any LUTS at this time - CXR unremarkable - Monitor closely for signs or symptoms of infection   Thrombocytosis - Platelets 472 on arrival-has now resolved and normal - Suspect this was secondary to AKI/dehydration - Trend CBC    History of breast cancer, 2013.  In remission since 2018. - Status post left mastectomy -- Has had annual mammograms without issue   X-linked hypophosphatemia rickets - Follows with [facility] endocrinology - History of bilateral femoral stress fractures 2019, tibial fracture 2021 - Receives Cryvista injections monthly.   B12 deficiency - Continue outpatient supplements   History of anemia - Hemoglobin 12.1 on arrival although I do suspect this was hemoconcentrated.  Down to 10.3 today, perhaps some degree of hemodilution.  Appears baseline is around 12. - Will continue to trend CBC daily - No overt signs of bleeding   Grief reaction - Continue sertraline  50mg  dose    Mixed hyperlipidemia - Continue statin   Hypertension - Pressure has been low/normotensive.  Hold home antihypertensives for now.  Will resume as needed   Hypokalemia Hypoalbuminemia Hypocalcemia Hypophosphatemia Hypomagnesemia - Trend daily CMP - Obtain ionized calcium  today. - Replace as needed   Elevated alk phos - Baseline 180s.  Lower than that today - No further LFT  elevation  DVT prophylaxis: Heparin    Code Status: Full Code Disposition: Inpatient.  Continued electrolyte and kidney function correction.  Discussed care at bedside with son, Rodman Clam.  Consultants:    Procedures:    Antimicrobials:  Anti-infectives (From admission, onward)    None       Data Reviewed: I have personally reviewed following labs and imaging studies CBC: Recent Labs  Lab 09/13/23 1534 09/18/23 1230 09/19/23 0700  WBC 12.6* 12.6* 8.6  NEUTROABS  --  10.3* 6.8  HGB 11.5* 12.1 10.3*  HCT 35.9* 37.0 31.3*  MCV 90.2 88.1 88.4  PLT 380 472* 323   Basic Metabolic Panel: Recent Labs  Lab 09/13/23 1605 09/18/23 1230 09/19/23 0700  NA 129* 130* 135  K 3.5 3.3* 3.7  CL 93* 100 108  CO2 20* 19* 19*  GLUCOSE 99 152* 96  BUN 48* 46* 37*  CREATININE 1.93* 2.42* 1.74*  CALCIUM  9.0 8.1* 7.8*  MG  --   --  1.5*  PHOS  --   --  2.3*   GFR: Estimated Creatinine Clearance: 18.3 mL/min (A) (by C-G formula based on SCr of 1.74 mg/dL (H)). Liver Function Tests: Recent Labs  Lab 09/13/23 1605 09/18/23 1230 09/19/23 0700  AST 25 21 17   ALT 17 15 11   ALKPHOS 152* 152* 101  BILITOT 0.8 0.8 0.9  PROT 7.8 7.0 5.2*  ALBUMIN 3.7 3.2* 2.3*   CBG: No results for input(s): "GLUCAP" in the last 168 hours.  Recent Results (from the past 240 hours)  Urine Culture     Status: Abnormal   Collection Time: 09/13/23  5:50 PM   Specimen: Urine, Clean Catch  Result Value Ref Range Status   Specimen Description   Final    URINE, CLEAN CATCH Performed at [facility], [address] [place], [place] [address]    Special Requests   Final    NONE Performed at [facility], [address] [place], [place] [address]    Culture (A)  Final    30,000 COLONIES/mL LACTOBACILLUS SPECIES Standardized susceptibility testing for this organism is not available. Performed at [facility], [address] [place], [place] [address]    Report Status  09/14/2023 FINAL  Final  Resp panel by RT-PCR (RSV, Flu A&B, Covid) Anterior Nasal Swab     Status: None   Collection Time: 09/18/23  8:02 PM   Specimen: Anterior Nasal Swab  Result Value Ref Range Status   SARS Coronavirus 2 by RT PCR NEGATIVE NEGATIVE Final    Comment: (NOTE) SARS-CoV-2 target nucleic acids are NOT DETECTED.  The SARS-CoV-2 RNA is generally detectable in upper respiratory specimens during the acute phase of infection. The lowest concentration of SARS-CoV-2 viral copies this assay can detect is 138 copies/mL. A negative result does not preclude SARS-Cov-2 infection and should not be used as the sole basis for treatment or other patient management decisions. A negative result may occur with  improper specimen collection/handling, submission of specimen other than nasopharyngeal swab, presence of viral mutation(s) within the areas targeted by this assay, and inadequate number of viral copies(<138 copies/mL). A negative result must be combined with clinical observations, patient history, and epidemiological information. The expected result is Negative.  Fact Sheet for Patients:  BloggerCourse.com  Fact Sheet for Healthcare Providers:  SeriousBroker.it  This test is no t yet approved or cleared by the [place]  FDA and  has been authorized for detection and/or diagnosis of SARS-CoV-2 by FDA under an Emergency Use Authorization (EUA). This EUA will remain  in effect (meaning this test can be used) for the duration of the COVID-19 declaration under Section 564(b)(1) of the Act, 21 U.S.C.section 360bbb-3(b)(1), unless the authorization is terminated  or revoked sooner.       Influenza A by PCR NEGATIVE NEGATIVE Final   Influenza B by PCR NEGATIVE NEGATIVE Final    Comment: (NOTE) The Xpert Xpress SARS-CoV-2/FLU/RSV plus assay is intended as an aid in the diagnosis of influenza from Nasopharyngeal swab specimens  and should not be used as a sole basis for treatment. Nasal washings and aspirates are unacceptable for Xpert Xpress SARS-CoV-2/FLU/RSV testing.  Fact Sheet for Patients: BloggerCourse.com  Fact Sheet for Healthcare Providers: SeriousBroker.it  This test is not yet approved or cleared by the [place]  FDA and has been authorized for detection and/or diagnosis of SARS-CoV-2 by FDA under an Emergency Use Authorization (EUA). This EUA will remain in effect (meaning this test can be used) for the duration of the COVID-19 declaration under Section 564(b)(1) of the Act, 21 U.S.C. section 360bbb-3(b)(1), unless the authorization is terminated or revoked.     Resp Syncytial Virus by PCR NEGATIVE NEGATIVE Final    Comment: (NOTE) Fact Sheet for Patients: BloggerCourse.com  Fact Sheet for Healthcare Providers: SeriousBroker.it  This test is  not yet approved or cleared by the [place]  FDA and has been authorized for detection and/or diagnosis of SARS-CoV-2 by FDA under an Emergency Use Authorization (EUA). This EUA will remain in effect (meaning this test can be used) for the duration of the COVID-19 declaration under Section 564(b)(1) of the Act, 21 U.S.C. section 360bbb-3(b)(1), unless the authorization is terminated or revoked.  Performed at [facility], [address] [place], [place] [address]      Radiology Studies: [facility] Chest Davis 1 View Result Date: 09/18/2023 CLINICAL DATA:  Weakness and hypotension. EXAM: PORTABLE CHEST 1 VIEW COMPARISON:  11/03/2011 FINDINGS: Shallow inspiration. Linear atelectasis in the lung bases. Mild cardiac enlargement. No vascular congestion, edema, or consolidation. No pleural effusion or pneumothorax. Mediastinal contours appear intact. Degenerative changes in the spine and shoulders. IMPRESSION: Shallow inspiration with atelectasis  in the lung bases. Electronically Signed   By: Boyce Byes M.D.   On: 09/18/2023 18:05    Scheduled Meds:  heparin   5,000 Units Subcutaneous Q8H   sertraline   50 mg Oral Daily   Continuous Infusions:  sodium chloride  125 mL/hr at 09/19/23 0550     LOS: 1 day  MDM: Patient is high risk for one or more organ failure.  They necessitate ongoing hospitalization for continued IV therapies and subsequent lab monitoring. Total time spent interpreting labs and vitals, reviewing the medical record, coordinating care amongst consultants and care team members, directly assessing and discussing care with the patient and/or family: 55 min Lum Stillinger, DO [facility]  To contact the attending physician between 7A-7P please use Epic Chat. To contact the covering physician during after hours 7P-7A, please review Amion.  09/19/2023, 3:04 PM   *This document has been created with the assistance of dictation software. Please excuse typographical errors. *

## 2023-09-20 ENCOUNTER — Encounter: Payer: Self-pay | Admitting: Nurse Practitioner

## 2023-09-20 ENCOUNTER — Inpatient Hospital Stay

## 2023-09-20 DIAGNOSIS — Z9012 Acquired absence of left breast and nipple: Secondary | ICD-10-CM | POA: Diagnosis not present

## 2023-09-20 DIAGNOSIS — F4320 Adjustment disorder, unspecified: Secondary | ICD-10-CM | POA: Diagnosis not present

## 2023-09-20 DIAGNOSIS — N179 Acute kidney failure, unspecified: Secondary | ICD-10-CM | POA: Diagnosis not present

## 2023-09-20 DIAGNOSIS — E041 Nontoxic single thyroid nodule: Secondary | ICD-10-CM | POA: Diagnosis not present

## 2023-09-20 DIAGNOSIS — E8809 Other disorders of plasma-protein metabolism, not elsewhere classified: Secondary | ICD-10-CM | POA: Diagnosis not present

## 2023-09-20 DIAGNOSIS — E782 Mixed hyperlipidemia: Secondary | ICD-10-CM | POA: Diagnosis not present

## 2023-09-20 DIAGNOSIS — Z823 Family history of stroke: Secondary | ICD-10-CM | POA: Diagnosis not present

## 2023-09-20 DIAGNOSIS — K769 Liver disease, unspecified: Secondary | ICD-10-CM | POA: Diagnosis not present

## 2023-09-20 DIAGNOSIS — Z7982 Long term (current) use of aspirin: Secondary | ICD-10-CM | POA: Diagnosis not present

## 2023-09-20 DIAGNOSIS — I129 Hypertensive chronic kidney disease with stage 1 through stage 4 chronic kidney disease, or unspecified chronic kidney disease: Secondary | ICD-10-CM | POA: Diagnosis not present

## 2023-09-20 DIAGNOSIS — Z87891 Personal history of nicotine dependence: Secondary | ICD-10-CM | POA: Diagnosis not present

## 2023-09-20 DIAGNOSIS — Z800 Family history of malignant neoplasm of digestive organs: Secondary | ICD-10-CM | POA: Diagnosis not present

## 2023-09-20 DIAGNOSIS — N1831 Chronic kidney disease, stage 3a: Secondary | ICD-10-CM | POA: Diagnosis not present

## 2023-09-20 DIAGNOSIS — E860 Dehydration: Secondary | ICD-10-CM | POA: Diagnosis present

## 2023-09-20 DIAGNOSIS — K4090 Unilateral inguinal hernia, without obstruction or gangrene, not specified as recurrent: Secondary | ICD-10-CM | POA: Diagnosis not present

## 2023-09-20 DIAGNOSIS — Z807 Family history of other malignant neoplasms of lymphoid, hematopoietic and related tissues: Secondary | ICD-10-CM | POA: Diagnosis not present

## 2023-09-20 DIAGNOSIS — Z853 Personal history of malignant neoplasm of breast: Secondary | ICD-10-CM | POA: Diagnosis not present

## 2023-09-20 DIAGNOSIS — K429 Umbilical hernia without obstruction or gangrene: Secondary | ICD-10-CM | POA: Diagnosis not present

## 2023-09-20 DIAGNOSIS — D75839 Thrombocytosis, unspecified: Secondary | ICD-10-CM | POA: Diagnosis not present

## 2023-09-20 DIAGNOSIS — E876 Hypokalemia: Secondary | ICD-10-CM | POA: Diagnosis not present

## 2023-09-20 DIAGNOSIS — E550 Rickets, active: Secondary | ICD-10-CM | POA: Diagnosis not present

## 2023-09-20 DIAGNOSIS — Z8249 Family history of ischemic heart disease and other diseases of the circulatory system: Secondary | ICD-10-CM | POA: Diagnosis not present

## 2023-09-20 DIAGNOSIS — Z1152 Encounter for screening for COVID-19: Secondary | ICD-10-CM | POA: Diagnosis not present

## 2023-09-20 DIAGNOSIS — Z820 Family history of epilepsy and other diseases of the nervous system: Secondary | ICD-10-CM | POA: Diagnosis not present

## 2023-09-20 LAB — COMPREHENSIVE METABOLIC PANEL WITH GFR
ALT: 13 U/L (ref 0–44)
AST: 18 U/L (ref 15–41)
Albumin: 2.4 g/dL — ABNORMAL LOW (ref 3.5–5.0)
Alkaline Phosphatase: 107 U/L (ref 38–126)
Anion gap: 7 (ref 5–15)
BUN: 27 mg/dL — ABNORMAL HIGH (ref 8–23)
CO2: 19 mmol/L — ABNORMAL LOW (ref 22–32)
Calcium: 7.9 mg/dL — ABNORMAL LOW (ref 8.9–10.3)
Chloride: 109 mmol/L (ref 98–111)
Creatinine, Ser: 1.23 mg/dL — ABNORMAL HIGH (ref 0.44–1.00)
GFR, Estimated: 45 mL/min — ABNORMAL LOW (ref >60)
Glucose, Bld: 121 mg/dL — ABNORMAL HIGH (ref 70–99)
Potassium: 3.5 mmol/L (ref 3.5–5.1)
Sodium: 135 mmol/L (ref 135–145)
Total Bilirubin: 0.5 mg/dL (ref 0.0–1.2)
Total Protein: 5.4 g/dL — ABNORMAL LOW (ref 6.5–8.1)

## 2023-09-20 LAB — GASTROINTESTINAL PANEL BY PCR, STOOL (REPLACES STOOL CULTURE)

## 2023-09-20 LAB — CBC WITH DIFFERENTIAL/PLATELET
Abs Immature Granulocytes: 0.03 10*3/uL (ref 0.00–0.07)
Basophils Absolute: 0 10*3/uL (ref 0.0–0.1)
Basophils Relative: 0 %
Eosinophils Absolute: 0.2 10*3/uL (ref 0.0–0.5)
Eosinophils Relative: 2 %
HCT: 32 % — ABNORMAL LOW (ref 36.0–46.0)
Hemoglobin: 10.2 g/dL — ABNORMAL LOW (ref 12.0–15.0)
Immature Granulocytes: 0 %
Lymphocytes Relative: 10 %
Lymphs Abs: 0.9 10*3/uL (ref 0.7–4.0)
MCH: 28.1 pg (ref 26.0–34.0)
MCHC: 31.9 g/dL (ref 30.0–36.0)
MCV: 88.2 fL (ref 80.0–100.0)
Monocytes Absolute: 0.7 10*3/uL (ref 0.1–1.0)
Monocytes Relative: 8 %
Neutro Abs: 7.2 10*3/uL (ref 1.7–7.7)
Neutrophils Relative %: 80 %
Platelets: 388 10*3/uL (ref 150–400)
RBC: 3.63 MIL/uL — ABNORMAL LOW (ref 3.87–5.11)
RDW: 13.1 % (ref 11.5–15.5)
WBC: 9.1 10*3/uL (ref 4.0–10.5)
nRBC: 0 % (ref 0.0–0.2)

## 2023-09-20 LAB — PHOSPHORUS: Phosphorus: 3.1 mg/dL (ref 2.5–4.6)

## 2023-09-20 LAB — MAGNESIUM: Magnesium: 2.5 mg/dL — ABNORMAL HIGH (ref 1.7–2.4)

## 2023-09-20 MED ORDER — IOHEXOL 300 MG/ML  SOLN
50.0000 mL | Freq: Once | INTRAMUSCULAR | Status: AC | PRN
  Administered 2023-09-20: 50 mL via INTRAVENOUS

## 2023-09-20 MED ORDER — ONDANSETRON 4 MG PO TBDP: 4.0000 mg | ORAL_TABLET | Freq: Three times a day (TID) | ORAL | 0 refills | Status: DC | PRN

## 2023-09-20 NOTE — TOC Initial Note (Addendum)
 Transition of Care [facility]) - Initial/Assessment Note    Patient Details  Name: Felicia Robinson MRN: [id number] Date of Birth: [birth year]  Transition [facility] Care [facility] CM/SW Contact:    Loman Risk, RN Phone Number: 09/20/2023, 12:44 PM  Clinical Narrative:                  Met with patient and son at bedside Therapy recommending outpatient PT.  Patient in agreement and states that she prefers [facility] local.  Order set send to MD to sign, and to be faxed to [facility] outpatient therapy   Expected Discharge Plan and Services         Expected Discharge Date: 09/20/23                                    Prior Living Arrangements/Services                       Activities of Daily Living   ADL Screening (condition at time of admission) Independently performs ADLs?: Yes (appropriate for developmental age) Is the patient deaf or have difficulty hearing?: Yes Does the patient have difficulty seeing, even when wearing glasses/contacts?: No Does the patient have difficulty concentrating, remembering, or making decisions?: No  Permission Sought/Granted                  Emotional Assessment              Admission diagnosis:  Dehydration [E86.0] AKI (acute kidney injury) (HCC) [N17.9] Patient Active Problem List   Diagnosis Date Noted   AKI (acute kidney injury) (HCC) 09/18/2023   Bone lesion 09/18/2023   Hypotension 09/18/2023   Osteoporosis 05/05/2021   Vitamin D  deficiency 03/25/2021   Anemia 03/25/2021   CKD (chronic kidney disease) stage 3, GFR 30-59 ml/min (HCC) 03/24/2021   B12 deficiency 02/02/2021   Mixed hyperlipidemia 02/02/2021   Estrogen deficiency 03/02/2020   Essential hypertension 01/29/2015   Controlled gout 01/29/2015   X-linked hypophosphatemic rickets 01/29/2015   Grief reaction 01/29/2015   Gait instability 01/29/2015   Recurrent falls 01/29/2015   Acquired scoliosis 01/29/2015   History of breast cancer in female 09/08/2012    History of shingles 12/06/2011   PCP:  Lemar Pyles, NP Pharmacy:   [facility] - [place], [place] - [address] [place] [address] Phone: [phone] Fax: [phone]     Social Drivers of Health (SDOH) Social History: SDOH Screenings   Food Insecurity: No Food Insecurity (09/19/2023)  Housing: Low Risk  (09/19/2023)  Transportation Needs: Unknown (09/19/2023)  Utilities: Not At Risk (09/19/2023)  Alcohol Screen: Low Risk  (08/31/2023)  Depression (PHQ2-9): Low Risk  (08/31/2023)  Financial Resource Strain: Low Risk  (09/13/2023)   Received from [facility]  Physical Activity: Insufficiently Active (08/31/2023)  Social Connections: Socially Integrated (09/19/2023)  Stress: No Stress Concern Present (08/31/2023)  Tobacco Use: Medium Risk (09/18/2023)  Health Literacy: Adequate Health Literacy (09/18/2023)   SDOH Interventions:     Readmission Risk Interventions     No data to display

## 2023-09-20 NOTE — Discharge Summary (Addendum)
 Physician Discharge Summary   Patient: Felicia Robinson MRN: [id number] DOB: 12-Nov-1945  Admit date:     09/18/2023  Discharge date: 09/20/23  Discharge Physician: Roise Cleaver   PCP: Lemar Pyles, NP   Recommendations at discharge:   Follow-up with primary care physician to recheck kidney function in 1 week.  Please note she did receive contrast on day of discharge so she may have slight increase in creatinine before continued downtrend Follow-up with oncology for PET scan and to receive results of multiple myeloma panel which is still pending at this time Follow-up with primary care to receive thyroid ultrasound to better evaluate thyroid nodule incidentally seen on chest CT  Discharge Diagnoses: Principal Problem:   AKI (acute kidney injury) (HCC)  Resolved Problems:   * No resolved hospital problems. *  Hospital Course: Felicia Robinson is a 78 y.o. female with medical history significant of stage IIIa CKD, X-linked hypophosphatemic rickets follows with [facility], osteoporosis, anemia, B12 deficiency, grief reaction of sertraline , mixed hyperlipidemia, vitamin D  deficiency, history of breast cancer, hypertension. On May 11, pt reports she syncopized at home and was brought to ED in [facility]. She reports she saw a PA at that time, received a course of Keflex for UTI, IVF and was sent home. She completed her course of antibiotics but has had persistent nausea/vomiting so she presented to our ED on 5/25.  During ED visit on 5/25 patient had CT abdomen pelvis which showed no acute abnormalities but did note diffuse bony lytic lesions which patient felt were related to rickets.  Patient was referred to oncology for further workup.  In the oncology office 5/30 she complained of generalized fatigue, blood pressure was 86/62 so she was recommended to present to the hospital for further workup. Patient reports since May 11 she has had ongoing nausea, vomiting.  She also endorses  chronic diarrhea over the last many months that does not happen daily but spontaneously without clear trigger.  Recently, she has had anorexia due to fear of vomiting.  No fevers at home. Patient lives alone and has no sick contacts.  In the ED initial BP was 83/60 but fluid responsive after 1L NS to 133/70.  Afebrile.  Labs revealed AKI, hypokalemia, hypocalcemia.  She had mild leukocytosis of 12.6.  She was admitted for fluid resuscitation. Gradually her AKI resolved and trended towards normal.  She endorsed significant improvement in nausea vomiting and was able to tolerate p.o. for a full 24 hours prior to discharge.  Workup was largely unremarkable.  No identified source of infection, GI PCR also negative. Regarding the diffuse lytic lesions seen on CT abdomen, I discussed directly with Dr. Wilhelmenia Harada of oncology who reports no further inpatient workup necessary.  Multiple myeloma panel was sent on day of admission and is still pending at this time.  Peripheral smear was unremarkable on WBC morphology but did reveal increased platelet count.  Chest CT this admission does not reveal any primary source of malignancy or other acute findings.  Did reveal incidental thyroid nodule that should be better evaluated with ultrasound outpatient.  TSH within normal limits Leukocytosis resolved with IV fluids, no indication of infection.  Likely reactive given profound dehydration.  Discussed care plan extensively with the patient and her son, Rodman Clam, at bedside.  They will pursue outpatient workup with oncology team and have follow-up planned with PCP tomorrow.   AKI on CKD - Prerenal secondary to vomiting, nausea, anorexia - Baseline creatinine 1.1, elevated to  2.4 on arrival.  Has downtrended to 1.23 - Status post IV fluids - Encourage p.o. intake.  Nausea vomiting resolved - Did receive CT with contrast today, may have small bump in creatinine tomorrow. - Follow-up with PCP in 1 week for repeat BMP to ensure  creatinine is resolving to baseline   Nausea, vomiting - Intermittent and ongoing for the last 3 weeks - COVID/RSV/flu negative. - TSH and hemoglobin A1c unremarkable - Unclear etiology. appears to be resolving - Tolerating diet now - Rx for Zofran  at discharge  Chronic diarrhea - Patient endorses intermittent and consistent diarrhea for the last many months - GI PCR negative - C. difficile still pending at time of discharge - Would benefit from outpatient follow-up with GI   Diffuse lytic lesions -CT abdomen pelvis 5/25: Chronic 6 mm hepatic lobe lesion, stable in size.  3 x 2.5 cm well-circumscribed fluid density lesion above the right gluteal musculature, small volume ascites containing umbilical hernia, small fat-containing right inguinal hernia.  Diffusely heterogenous axial and appendicular skeleton with multiple underlying lytic lesions, redemonstration of a chronic S3 sacral foramina fluid density lesion expanding diaphragm and is suggestive of a Tarlov cyst. - Discussed with Dr. Wilhelmenia Harada from oncology.  Reports multiple myeloma panel has already been sent.   -Peripheral smear review is unremarkable WBC morphology.  Increased platelet count. - Chest CT 6/1 without primary lesion.  No significant adenopathy - Patient will need PET scan outpatient.  Leukocytosis - WBC 12.6 on arrival, unchanged from prior - No signs or symptoms of infection.   - Low suspicion for infection.  Patient has been afebrile.  Leukocytosis has resolved without intervention - Suspect this was reactive from dehydration -urine culture from 5/25 growing 30,000 colonies lactobacillus -- Pt completed course of Keflex - denies any LUTS at this time - CXR unremarkable  Thrombocytosis - Platelets 472 on arrival-has now resolved and normal - Suspect this was secondary to AKI/dehydration   History of breast cancer, 2013.  In remission since 2018. - Status post left mastectomy -- Has had annual mammograms without  recurrence.   X-linked hypophosphatemia rickets - Follows with [facility] endocrinology - History of bilateral femoral stress fractures 2019, tibial fracture 2021 - Receives Cryvista injections monthly.  Scheduled for next dose this week.  Have advised to follow-up with oncology first for continuing   B12 deficiency - Continue outpatient supplements   History of iron deficiency anemia - Hemoglobin 12.1 on arrival although I do suspect this was hemoconcentrated. - Has downtrended to 10.2 which is likely closer to her baseline - Continue supplementation with iron and B12 at discharge   Grief reaction - Continue sertraline  50mg  dose which patient is taking at home   Mixed hyperlipidemia - Continue statin   Hypertension - Pressure has been low/normotensive.  Hold home antihypertensives for now.  Will resume as needed   Hypokalemia Hypoalbuminemia Hypocalcemia Hypophosphatemia Hypomagnesemia - Ionized calcium  ordered, pending.  Follows with endocrinology - PCP follow-up for repeat CMP this week   Elevated alk phos - Baseline 180s.  Alk phos currently improved beyond baseline - No further LFT elevation     Thyroid nodule - 1.7 cm right thyroid nodule seen on chest CT.  Outpatient follow-up for thyroid ultrasound - TSH within normal limits  Disposition: Home Diet recommendation:  Discharge Diet Orders (From admission, onward)     Start     Ordered   09/20/23 0000  Diet general        09/20/23 1210  Regular diet DISCHARGE MEDICATION: Allergies as of 09/20/2023       Reactions   Allopurinol     diarrhea   Codeine Nausea And Vomiting   Other    Pain medications Altered mental status   Shellfish Allergy Diarrhea, Nausea And Vomiting   Just when eating clams Just when eating clams Just when eating clams        Medication List     PAUSE taking these medications    Nebivolol  HCl 20 MG Tabs Wait to take this until your doctor or other care provider tells  you to start again. TAKE 1 TABLET(20 MG) BY MOUTH DAILY   olmesartan -hydrochlorothiazide 40-12.5 MG tablet Wait to take this until your doctor or other care provider tells you to start again. Commonly known as: Benicar  HCT Take 1 tablet by mouth daily.       STOP taking these medications    ibuprofen 200 MG tablet Commonly known as: ADVIL       TAKE these medications    aspirin 81 MG chewable tablet Chew 81 mg by mouth daily.   ondansetron  4 MG disintegrating tablet Commonly known as: ZOFRAN -ODT Take 1 tablet (4 mg total) by mouth every 8 (eight) hours as needed for nausea or vomiting.   rosuvastatin  5 MG tablet Commonly known as: CRESTOR  TAKE 1 TABLET(5 MG) BY MOUTH DAILY   sertraline  50 MG tablet Commonly known as: ZOLOFT  Take 1.5 tablets (75 mg total) by mouth daily.   Slow Fe 142 (45 Fe) MG Tbcr Generic drug: Ferrous Sulfate Take 1 tablet by mouth daily. Take with a little orange juice.   VITAMIN B12 PO Take 3,000 mcg by mouth daily.   Vitamin D3 50 MCG (2000 UT) Tabs Take 2,000 Units by mouth daily.        Discharge Exam: There were no vitals filed for this visit.    09/20/2023    8:06 AM 09/20/2023    3:53 AM 09/19/2023    7:45 PM  Vitals with BMI  Systolic 118 116 045  Diastolic 69 72 69  Pulse 85 86 85   Constitutional:  Normal appearance. Non toxic-appearing.  HENT: Head Normocephalic and atraumatic.  Mucous membranes are moist.  Eyes:  Extraocular intact. Conjunctivae normal. Pupils are equal, round, and reactive to light.  Cardiovascular: Rate and Rhythm: Normal rate and regular rhythm.  Pulmonary: Non labored, symmetric rise of chest wall.  Musculoskeletal:  Normal range of motion.  Skin: warm and dry. not jaundiced.  Neurological: No focal deficit present. alert. Oriented. Psychiatric: Mood and Affect congruent.    Condition at discharge: stable  The results of significant diagnostics from this hospitalization (including imaging,  microbiology, ancillary and laboratory) are listed below for reference.   Imaging Studies: CT CHEST W CONTRAST Result Date: 09/20/2023 CLINICAL DATA:  History of left-sided breast cancer status post mastectomy. Lytic lesions on abdomen/pelvis CT 09/13/2023. EXAM: CT CHEST WITH CONTRAST TECHNIQUE: Multidetector CT imaging of the chest was performed during intravenous contrast administration. RADIATION DOSE REDUCTION: This exam was performed according to the departmental dose-optimization program which includes automated exposure control, adjustment of the mA and/or kV according to patient size and/or use of iterative reconstruction technique. CONTRAST:  50mL OMNIPAQUE IOHEXOL 300 MG/ML  SOLN COMPARISON:  None Available. FINDINGS: Cardiovascular: Heart size upper normal. No substantial pericardial effusion. Mild atherosclerotic calcification is noted in the wall of the thoracic aorta. Enlargement of the pulmonary outflow tract/main pulmonary arteries suggests pulmonary arterial hypertension. Mediastinum/Nodes: 1.7 cm right thyroid  nodule evident. No mediastinal lymphadenopathy. No gross colonic mass. There is no hilar lymphadenopathy. Small to moderate hiatal hernia. The esophagus has normal imaging features. There is no axillary lymphadenopathy. Lungs/Pleura: Centrilobular emphsyema noted. No suspicious pulmonary nodule or mass. 3 mm left upper lobe nodule identified on 34/4. Minimal subsegmental atelectasis noted in both lower lungs. Small left and tiny right pleural effusions. Upper Abdomen: Moderate ascites noted in the visualized upper abdomen. Heterogeneous enhancement of liver parenchyma with 6 mm low-density anterior subcapsular liver lesion noted, similar to recent abdomen CT. Musculoskeletal: Heterogeneous mineralization diffusely in the visualized bony anatomy. No overtly concerning lytic or sclerotic osseous abnormality within the visualized bony anatomy of the thorax. Degenerative changes are noted in  both shoulders, right greater than left. IMPRESSION: 1. No evidence for primary malignancy in the chest. 2. Small left and tiny right pleural effusions with minimal subsegmental atelectasis in both lower lungs. 3. Enlargement of the pulmonary outflow tract/main pulmonary arteries suggests pulmonary arterial hypertension. 4. Bones are heterogeneously mineralized without a dominant suspicious lytic or sclerotic osseous lesion evident. 5. Moderate ascites in the visualized upper abdomen. 6. Heterogeneous enhancement of liver parenchyma with 6 mm low-density anterior subcapsular liver lesion, similar to recent abdomen CT. 7. 1.7 cm right thyroid nodule. Recommend thyroid US  (ref: J Am Coll Radiol. 2015 Feb;12(2): 143-50). 8. Aortic Atherosclerosis (ICD10-I70.0) and Emphysema (ICD10-J43.9). Electronically Signed   By: Donnal Fusi M.D.   On: 09/20/2023 08:53   DG Chest Port 1 View Result Date: 09/18/2023 CLINICAL DATA:  Weakness and hypotension. EXAM: PORTABLE CHEST 1 VIEW COMPARISON:  11/03/2011 FINDINGS: Shallow inspiration. Linear atelectasis in the lung bases. Mild cardiac enlargement. No vascular congestion, edema, or consolidation. No pleural effusion or pneumothorax. Mediastinal contours appear intact. Degenerative changes in the spine and shoulders. IMPRESSION: Shallow inspiration with atelectasis in the lung bases. Electronically Signed   By: Boyce Byes M.D.   On: 09/18/2023 18:05   CT ABDOMEN PELVIS WO CONTRAST Result Date: 09/13/2023 CLINICAL DATA:  Abdominal pain, acute, nonlocalized EXAM: CT ABDOMEN AND PELVIS WITHOUT CONTRAST TECHNIQUE: Multidetector CT imaging of the abdomen and pelvis was performed following the standard protocol without IV contrast. RADIATION DOSE REDUCTION: This exam was performed according to the departmental dose-optimization program which includes automated exposure control, adjustment of the mA and/or kV according to patient size and/or use of iterative reconstruction  technique. COMPARISON:  PET CT 11/05/2011 FINDINGS: Lower chest: No acute abnormality.  Small hiatal hernia. Hepatobiliary: Stable chronic 6 mm hepatic lobe lesion (2:21). No gallstones, gallbladder wall thickening, or pericholecystic fluid. No biliary dilatation. Pancreas: No focal lesion. Normal pancreatic contour. No surrounding inflammatory changes. No main pancreatic ductal dilatation. Spleen: Normal in size without focal abnormality. Adrenals/Urinary Tract: No adrenal nodule bilaterally. No nephrolithiasis and no hydronephrosis. Indeterminate 8 mm hyperdense lesion of the left kidney (2:19)-given hyperdensity finding likely represents a proteinaceous or hemorrhagic cyst-no further follow-up indicated. Subcentimeter hypodensities are too small to characterize-no further follow-up indicated. No ureterolithiasis or hydroureter. The urinary bladder is unremarkable. Stomach/Bowel: Stomach is within normal limits. No evidence of bowel wall thickening or dilatation. The appendix is not definitely identified with no inflammatory changes in the right lower quadrant to suggest acute appendicitis. Vascular/Lymphatic: No abdominal aorta or iliac aneurysm. Mild atherosclerotic plaque of the aorta and its branches. No abdominal, pelvic, or inguinal lymphadenopathy. Reproductive: Uterus and bilateral adnexa are unremarkable. Other: Vsimple free fluid. No intraperitoneal free gas. No organized fluid collection. Musculoskeletal: Interval development of a 3 x 2.5 cm  well-circumscribed fluid density lesion just deep of the right gluteal musculature and lateral to the ischial tuberosity (2:77). Small volume ascites containing umbilical hernia. Small fat containing right inguinal hernia. Status post left mastectomy. Diffusely heterogeneous axial and appendicular skeleton with multiple underlying lytic lesions. No acute displaced fracture. Multilevel degenerative changes of the spine. Redemonstration of a chronic S3 sacral foramina  fluid density lesion expanding the foramina suggestive of a Tarlov cyst. Bilateral hip degenerative changes. IMPRESSION: 1. Small hiatal hernia. 2. Small to moderate volume simple ascites. 3. Diffusely heterogeneous axial and appendicular skeleton with multiple underlying lytic lesions. Underlying metastatic lesions versus multiple myeloma not excluded. 4. Interval development of an indeterminate 3 cm well-circumscribed fluid density lesion just deep to the right gluteal musculature and lateral to the distal tuberosity. 5. Otherwise limited evaluation on this noncontrast study. 6.  Aortic Atherosclerosis (ICD10-I70.0). Electronically Signed   By: Morgane  Naveau M.D.   On: 09/13/2023 17:25    Microbiology: Results for orders placed or performed during the hospital encounter of 09/18/23  Resp panel by RT-PCR (RSV, Flu A&B, Covid) Anterior Nasal Swab     Status: None   Collection Time: 09/18/23  8:02 PM   Specimen: Anterior Nasal Swab  Result Value Ref Range Status   SARS Coronavirus 2 by RT PCR NEGATIVE NEGATIVE Final    Comment: (NOTE) SARS-CoV-2 target nucleic acids are NOT DETECTED.  The SARS-CoV-2 RNA is generally detectable in upper respiratory specimens during the acute phase of infection. The lowest concentration of SARS-CoV-2 viral copies this assay can detect is 138 copies/mL. A negative result does not preclude SARS-Cov-2 infection and should not be used as the sole basis for treatment or other patient management decisions. A negative result may occur with  improper specimen collection/handling, submission of specimen other than nasopharyngeal swab, presence of viral mutation(s) within the areas targeted by this assay, and inadequate number of viral copies(<138 copies/mL). A negative result must be combined with clinical observations, patient history, and epidemiological information. The expected result is Negative.  Fact Sheet for Patients:   BloggerCourse.com  Fact Sheet for Healthcare Providers:  SeriousBroker.it  This test is no t yet approved or cleared by the [place]  FDA and  has been authorized for detection and/or diagnosis of SARS-CoV-2 by FDA under an Emergency Use Authorization (EUA). This EUA will remain  in effect (meaning this test can be used) for the duration of the COVID-19 declaration under Section 564(b)(1) of the Act, 21 U.S.C.section 360bbb-3(b)(1), unless the authorization is terminated  or revoked sooner.       Influenza A by PCR NEGATIVE NEGATIVE Final   Influenza B by PCR NEGATIVE NEGATIVE Final    Comment: (NOTE) The Xpert Xpress SARS-CoV-2/FLU/RSV plus assay is intended as an aid in the diagnosis of influenza from Nasopharyngeal swab specimens and should not be used as a sole basis for treatment. Nasal washings and aspirates are unacceptable for Xpert Xpress SARS-CoV-2/FLU/RSV testing.  Fact Sheet for Patients: BloggerCourse.com  Fact Sheet for Healthcare Providers: SeriousBroker.it  This test is not yet approved or cleared by the [place]  FDA and has been authorized for detection and/or diagnosis of SARS-CoV-2 by FDA under an Emergency Use Authorization (EUA). This EUA will remain in effect (meaning this test can be used) for the duration of the COVID-19 declaration under Section 564(b)(1) of the Act, 21 U.S.C. section 360bbb-3(b)(1), unless the authorization is terminated or revoked.     Resp Syncytial Virus by PCR NEGATIVE NEGATIVE Final  Comment: (NOTE) Fact Sheet for Patients: BloggerCourse.com  Fact Sheet for Healthcare Providers: SeriousBroker.it  This test is not yet approved or cleared by the [place]  FDA and has been authorized for detection and/or diagnosis of SARS-CoV-2 by FDA under an Emergency Use  Authorization (EUA). This EUA will remain in effect (meaning this test can be used) for the duration of the COVID-19 declaration under Section 564(b)(1) of the Act, 21 U.S.C. section 360bbb-3(b)(1), unless the authorization is terminated or revoked.  Performed at [facility], [address] [place], [place] [address]   Gastrointestinal Panel by PCR , Stool     Status: None   Collection Time: 09/20/23  4:34 AM   Specimen: Stool  Result Value Ref Range Status   Campylobacter species NOT DETECTED NOT DETECTED Final   Plesimonas shigelloides NOT DETECTED NOT DETECTED Final   Salmonella species NOT DETECTED NOT DETECTED Final   Yersinia enterocolitica NOT DETECTED NOT DETECTED Final   Vibrio species NOT DETECTED NOT DETECTED Final   Vibrio cholerae NOT DETECTED NOT DETECTED Final   Enteroaggregative E coli (EAEC) NOT DETECTED NOT DETECTED Final   Enteropathogenic E coli (EPEC) NOT DETECTED NOT DETECTED Final   Enterotoxigenic E coli (ETEC) NOT DETECTED NOT DETECTED Final   Shiga like toxin producing E coli (STEC) NOT DETECTED NOT DETECTED Final   Shigella/Enteroinvasive E coli (EIEC) NOT DETECTED NOT DETECTED Final   Cryptosporidium NOT DETECTED NOT DETECTED Final   Cyclospora cayetanensis NOT DETECTED NOT DETECTED Final   Entamoeba histolytica NOT DETECTED NOT DETECTED Final   Giardia lamblia NOT DETECTED NOT DETECTED Final   Adenovirus F40/41 NOT DETECTED NOT DETECTED Final   Astrovirus NOT DETECTED NOT DETECTED Final   Norovirus GI/GII NOT DETECTED NOT DETECTED Final   Rotavirus A NOT DETECTED NOT DETECTED Final   Sapovirus (I, II, IV, and V) NOT DETECTED NOT DETECTED Final    Comment: Performed at [facility], [address] [place], [place] [address]    Labs: CBC: Recent Labs  Lab 09/13/23 1534 09/18/23 1230 09/19/23 0700 09/20/23 0642  WBC 12.6* 12.6* 8.6 9.1  NEUTROABS  --  10.3* 6.8 7.2  HGB 11.5* 12.1 10.3* 10.2*  HCT 35.9* 37.0 31.3* 32.0*   MCV 90.2 88.1 88.4 88.2  PLT 380 472* 323 388   Basic Metabolic Panel: Recent Labs  Lab 09/13/23 1605 09/18/23 1230 09/19/23 0700 09/20/23 0642  NA 129* 130* 135 135  K 3.5 3.3* 3.7 3.5  CL 93* 100 108 109  CO2 20* 19* 19* 19*  GLUCOSE 99 152* 96 121*  BUN 48* 46* 37* 27*  CREATININE 1.93* 2.42* 1.74* 1.23*  CALCIUM  9.0 8.1* 7.8* 7.9*  MG  --   --  1.5* 2.5*  PHOS  --   --  2.3* 3.1   Liver Function Tests: Recent Labs  Lab 09/13/23 1605 09/18/23 1230 09/19/23 0700 09/20/23 0642  AST 25 21 17 18   ALT 17 15 11 13   ALKPHOS 152* 152* 101 107  BILITOT 0.8 0.8 0.9 0.5  PROT 7.8 7.0 5.2* 5.4*  ALBUMIN 3.7 3.2* 2.3* 2.4*   CBG: No results for input(s): "GLUCAP" in the last 168 hours.  Discharge time spent: 32 minutes.  Signed: Iara Monds, DO [facility] 09/20/2023

## 2023-09-20 NOTE — Plan of Care (Signed)

## 2023-09-20 NOTE — Progress Notes (Signed)
     [facility] REFERRAL        Occupational Therapy * Physical Therapy * Speech Therapy                           DATE 09/20/23  PATIENT NAME  Felicia Robinson  PATIENT MRN  [id number]       DIAGNOSIS/DIAGNOSIS CODE  AKI  DATE OF DISCHARGE: 09/20/23        PRIMARY CARE PHYSICIAN      PCP PHONE/FAX  Lemar Pyles, NP     General - Nurse Practitioner    [phone]       Dear Provider (Name: [facility] outpatient __  Fax: [phone]   I certify that I have examined this patient and that occupational/physical/speech therapy is necessary on an outpatient basis.    The patient has expressed interest in completing their recommended course of therapy at your  location.  Once a formal order from the patient's primary care physician has been obtained, please  contact him/her to schedule an appointment for evaluation at your earliest convenience.   [ x]  Physical Therapy Evaluate and Treat  [  ]  Occupational Therapy Evaluate and Treat  [  ]  Speech Therapy Evaluate and Treat         The patient's primary care physician (listed above) must furnish and be responsible for a formal order such that the recommended services may be furnished while under the primary physician's care, and that the plan of care will be established and reviewed every 30 days (or more often if condition necessitates).

## 2023-09-21 ENCOUNTER — Other Ambulatory Visit: Payer: Self-pay

## 2023-09-21 ENCOUNTER — Encounter: Payer: Self-pay | Admitting: Nurse Practitioner

## 2023-09-21 ENCOUNTER — Emergency Department

## 2023-09-21 ENCOUNTER — Ambulatory Visit: Admitting: Nurse Practitioner

## 2023-09-21 ENCOUNTER — Inpatient Hospital Stay
Admission: EM | Admit: 2023-09-21 | Discharge: 2023-09-25 | DRG: 683 | Disposition: A | Discharge status: Home Health | Attending: Internal Medicine | Admitting: Internal Medicine

## 2023-09-21 ENCOUNTER — Telehealth: Payer: Self-pay

## 2023-09-21 VITALS — BP 102/66 | HR 101 | Temp 98.3°F | Ht <= 58 in | Wt 129.4 lb

## 2023-09-21 DIAGNOSIS — I10 Essential (primary) hypertension: Secondary | ICD-10-CM

## 2023-09-21 DIAGNOSIS — F4320 Adjustment disorder, unspecified: Secondary | ICD-10-CM | POA: Diagnosis present

## 2023-09-21 DIAGNOSIS — N179 Acute kidney failure, unspecified: Secondary | ICD-10-CM

## 2023-09-21 DIAGNOSIS — Z515 Encounter for palliative care: Secondary | ICD-10-CM

## 2023-09-21 DIAGNOSIS — Z87891 Personal history of nicotine dependence: Secondary | ICD-10-CM

## 2023-09-21 DIAGNOSIS — E8770 Fluid overload, unspecified: Secondary | ICD-10-CM | POA: Diagnosis present

## 2023-09-21 DIAGNOSIS — Z9842 Cataract extraction status, left eye: Secondary | ICD-10-CM

## 2023-09-21 DIAGNOSIS — R188 Other ascites: Secondary | ICD-10-CM | POA: Diagnosis present

## 2023-09-21 DIAGNOSIS — E8809 Other disorders of plasma-protein metabolism, not elsewhere classified: Secondary | ICD-10-CM | POA: Diagnosis present

## 2023-09-21 DIAGNOSIS — Z7982 Long term (current) use of aspirin: Secondary | ICD-10-CM

## 2023-09-21 DIAGNOSIS — Z8249 Family history of ischemic heart disease and other diseases of the circulatory system: Secondary | ICD-10-CM

## 2023-09-21 DIAGNOSIS — Z823 Family history of stroke: Secondary | ICD-10-CM

## 2023-09-21 DIAGNOSIS — I129 Hypertensive chronic kidney disease with stage 1 through stage 4 chronic kidney disease, or unspecified chronic kidney disease: Secondary | ICD-10-CM | POA: Diagnosis present

## 2023-09-21 DIAGNOSIS — R140 Abdominal distension (gaseous): Secondary | ICD-10-CM | POA: Diagnosis not present

## 2023-09-21 DIAGNOSIS — C7951 Secondary malignant neoplasm of bone: Secondary | ICD-10-CM | POA: Diagnosis present

## 2023-09-21 DIAGNOSIS — I509 Heart failure, unspecified: Secondary | ICD-10-CM | POA: Diagnosis not present

## 2023-09-21 DIAGNOSIS — Z91013 Allergy to seafood: Secondary | ICD-10-CM

## 2023-09-21 DIAGNOSIS — Z885 Allergy status to narcotic agent status: Secondary | ICD-10-CM

## 2023-09-21 DIAGNOSIS — R112 Nausea with vomiting, unspecified: Secondary | ICD-10-CM | POA: Diagnosis present

## 2023-09-21 DIAGNOSIS — K769 Liver disease, unspecified: Secondary | ICD-10-CM | POA: Diagnosis present

## 2023-09-21 DIAGNOSIS — N1831 Chronic kidney disease, stage 3a: Secondary | ICD-10-CM | POA: Diagnosis present

## 2023-09-21 DIAGNOSIS — K429 Umbilical hernia without obstruction or gangrene: Secondary | ICD-10-CM | POA: Diagnosis present

## 2023-09-21 DIAGNOSIS — M7989 Other specified soft tissue disorders: Secondary | ICD-10-CM | POA: Diagnosis present

## 2023-09-21 DIAGNOSIS — K4090 Unilateral inguinal hernia, without obstruction or gangrene, not specified as recurrent: Secondary | ICD-10-CM | POA: Diagnosis present

## 2023-09-21 DIAGNOSIS — R531 Weakness: Secondary | ICD-10-CM | POA: Diagnosis present

## 2023-09-21 DIAGNOSIS — M810 Age-related osteoporosis without current pathological fracture: Secondary | ICD-10-CM | POA: Diagnosis present

## 2023-09-21 DIAGNOSIS — E782 Mixed hyperlipidemia: Secondary | ICD-10-CM | POA: Diagnosis present

## 2023-09-21 DIAGNOSIS — M899 Disorder of bone, unspecified: Secondary | ICD-10-CM

## 2023-09-21 DIAGNOSIS — T502X5A Adverse effect of carbonic-anhydrase inhibitors, benzothiadiazides and other diuretics, initial encounter: Secondary | ICD-10-CM | POA: Diagnosis present

## 2023-09-21 DIAGNOSIS — Z888 Allergy status to other drugs, medicaments and biological substances status: Secondary | ICD-10-CM

## 2023-09-21 DIAGNOSIS — R0600 Dyspnea, unspecified: Principal | ICD-10-CM

## 2023-09-21 DIAGNOSIS — Z9841 Cataract extraction status, right eye: Secondary | ICD-10-CM

## 2023-09-21 DIAGNOSIS — Z807 Family history of other malignant neoplasms of lymphoid, hematopoietic and related tissues: Secondary | ICD-10-CM

## 2023-09-21 DIAGNOSIS — N2581 Secondary hyperparathyroidism of renal origin: Secondary | ICD-10-CM | POA: Diagnosis present

## 2023-09-21 DIAGNOSIS — Z800 Family history of malignant neoplasm of digestive organs: Secondary | ICD-10-CM

## 2023-09-21 DIAGNOSIS — E860 Dehydration: Secondary | ICD-10-CM | POA: Diagnosis present

## 2023-09-21 DIAGNOSIS — R600 Localized edema: Secondary | ICD-10-CM

## 2023-09-21 DIAGNOSIS — N189 Chronic kidney disease, unspecified: Secondary | ICD-10-CM | POA: Diagnosis present

## 2023-09-21 DIAGNOSIS — I959 Hypotension, unspecified: Secondary | ICD-10-CM | POA: Diagnosis present

## 2023-09-21 DIAGNOSIS — Z820 Family history of epilepsy and other diseases of the nervous system: Secondary | ICD-10-CM

## 2023-09-21 DIAGNOSIS — E041 Nontoxic single thyroid nodule: Secondary | ICD-10-CM | POA: Diagnosis present

## 2023-09-21 DIAGNOSIS — D75839 Thrombocytosis, unspecified: Secondary | ICD-10-CM | POA: Diagnosis present

## 2023-09-21 DIAGNOSIS — E538 Deficiency of other specified B group vitamins: Secondary | ICD-10-CM | POA: Diagnosis present

## 2023-09-21 DIAGNOSIS — K529 Noninfective gastroenteritis and colitis, unspecified: Secondary | ICD-10-CM | POA: Diagnosis present

## 2023-09-21 DIAGNOSIS — Z1152 Encounter for screening for COVID-19: Secondary | ICD-10-CM

## 2023-09-21 DIAGNOSIS — Z853 Personal history of malignant neoplasm of breast: Secondary | ICD-10-CM

## 2023-09-21 DIAGNOSIS — Y92239 Unspecified place in hospital as the place of occurrence of the external cause: Secondary | ICD-10-CM | POA: Diagnosis present

## 2023-09-21 DIAGNOSIS — D509 Iron deficiency anemia, unspecified: Secondary | ICD-10-CM | POA: Diagnosis present

## 2023-09-21 DIAGNOSIS — Z79899 Other long term (current) drug therapy: Secondary | ICD-10-CM

## 2023-09-21 DIAGNOSIS — Z9012 Acquired absence of left breast and nipple: Secondary | ICD-10-CM

## 2023-09-21 DIAGNOSIS — D631 Anemia in chronic kidney disease: Secondary | ICD-10-CM | POA: Diagnosis present

## 2023-09-21 DIAGNOSIS — J90 Pleural effusion, not elsewhere classified: Secondary | ICD-10-CM | POA: Diagnosis present

## 2023-09-21 DIAGNOSIS — T508X5A Adverse effect of diagnostic agents, initial encounter: Secondary | ICD-10-CM | POA: Diagnosis present

## 2023-09-21 LAB — COMPREHENSIVE METABOLIC PANEL WITH GFR
ALT: 13 U/L (ref 0–44)
AST: 23 U/L (ref 15–41)
Albumin: 2.7 g/dL — ABNORMAL LOW (ref 3.5–5.0)
Alkaline Phosphatase: 120 U/L (ref 38–126)
Anion gap: 11 (ref 5–15)
BUN: 28 mg/dL — ABNORMAL HIGH (ref 8–23)
CO2: 18 mmol/L — ABNORMAL LOW (ref 22–32)
Calcium: 8.1 mg/dL — ABNORMAL LOW (ref 8.9–10.3)
Chloride: 106 mmol/L (ref 98–111)
Creatinine, Ser: 1.77 mg/dL — ABNORMAL HIGH (ref 0.44–1.00)
GFR, Estimated: 29 mL/min — ABNORMAL LOW (ref >60)
Glucose, Bld: 143 mg/dL — ABNORMAL HIGH (ref 70–99)
Potassium: 3.8 mmol/L (ref 3.5–5.1)
Sodium: 135 mmol/L (ref 135–145)
Total Bilirubin: 0.5 mg/dL (ref 0.0–1.2)
Total Protein: 6.1 g/dL — ABNORMAL LOW (ref 6.5–8.1)

## 2023-09-21 LAB — CBC WITH DIFFERENTIAL/PLATELET
Abs Immature Granulocytes: 0.08 10*3/uL — ABNORMAL HIGH (ref 0.00–0.07)
Basophils Absolute: 0 10*3/uL (ref 0.0–0.1)
Basophils Relative: 0 %
Eosinophils Absolute: 0.2 10*3/uL (ref 0.0–0.5)
Eosinophils Relative: 1 %
HCT: 36 % (ref 36.0–46.0)
Hemoglobin: 11.3 g/dL — ABNORMAL LOW (ref 12.0–15.0)
Immature Granulocytes: 1 %
Lymphocytes Relative: 7 %
Lymphs Abs: 1 10*3/uL (ref 0.7–4.0)
MCH: 28.5 pg (ref 26.0–34.0)
MCHC: 31.4 g/dL (ref 30.0–36.0)
MCV: 90.7 fL (ref 80.0–100.0)
Monocytes Absolute: 0.9 10*3/uL (ref 0.1–1.0)
Monocytes Relative: 7 %
Neutro Abs: 10.8 10*3/uL — ABNORMAL HIGH (ref 1.7–7.7)
Neutrophils Relative %: 84 %
Platelets: 437 10*3/uL — ABNORMAL HIGH (ref 150–400)
RBC: 3.97 MIL/uL (ref 3.87–5.11)
RDW: 13.3 % (ref 11.5–15.5)
WBC: 12.9 10*3/uL — ABNORMAL HIGH (ref 4.0–10.5)
nRBC: 0 % (ref 0.0–0.2)

## 2023-09-21 LAB — CBC
HCT: 36.5 % (ref 36.0–46.0)
Hemoglobin: 11.4 g/dL — ABNORMAL LOW (ref 12.0–15.0)
MCH: 28.3 pg (ref 26.0–34.0)
MCHC: 31.2 g/dL (ref 30.0–36.0)
MCV: 90.6 fL (ref 80.0–100.0)
Platelets: 466 10*3/uL — ABNORMAL HIGH (ref 150–400)
RBC: 4.03 MIL/uL (ref 3.87–5.11)
RDW: 13.2 % (ref 11.5–15.5)
WBC: 14 10*3/uL — ABNORMAL HIGH (ref 4.0–10.5)
nRBC: 0 % (ref 0.0–0.2)

## 2023-09-21 LAB — MULTIPLE MYELOMA PANEL, SERUM
Albumin SerPl Elph-Mcnc: 2.9 g/dL (ref 2.9–4.4)
Albumin/Glob SerPl: 0.9 (ref 0.7–1.7)
Alpha 1: 0.4 g/dL (ref 0.0–0.4)
Alpha2 Glob SerPl Elph-Mcnc: 1 g/dL (ref 0.4–1.0)
B-Globulin SerPl Elph-Mcnc: 1 g/dL (ref 0.7–1.3)
Gamma Glob SerPl Elph-Mcnc: 1 g/dL (ref 0.4–1.8)
Globulin, Total: 3.5 g/dL (ref 2.2–3.9)
IgA: 201 mg/dL (ref 64–422)
IgG (Immunoglobin G), Serum: 1079 mg/dL (ref 586–1602)
IgM (Immunoglobulin M), Srm: 57 mg/dL (ref 26–217)
Total Protein ELP: 6.4 g/dL (ref 6.0–8.5)

## 2023-09-21 LAB — KAPPA/LAMBDA LIGHT CHAINS
Kappa free light chain: 55.3 mg/L — ABNORMAL HIGH (ref 3.3–19.4)
Kappa, lambda light chain ratio: 0.82 (ref 0.26–1.65)
Lambda free light chains: 67.4 mg/L — ABNORMAL HIGH (ref 5.7–26.3)

## 2023-09-21 LAB — TROPONIN I (HIGH SENSITIVITY)
Troponin I (High Sensitivity): 10 ng/L (ref <18)
Troponin I (High Sensitivity): 10 ng/L (ref <18)

## 2023-09-21 LAB — PROTIME-INR
INR: 1.2 (ref 0.8–1.2)
Prothrombin Time: 15.2 s (ref 11.4–15.2)

## 2023-09-21 LAB — BRAIN NATRIURETIC PEPTIDE: B Natriuretic Peptide: 187.9 pg/mL — ABNORMAL HIGH (ref 0.0–100.0)

## 2023-09-21 LAB — CREATININE, SERUM
Creatinine, Ser: 1.8 mg/dL — ABNORMAL HIGH (ref 0.44–1.00)
GFR, Estimated: 29 mL/min — ABNORMAL LOW (ref >60)

## 2023-09-21 LAB — CALCIUM, IONIZED: Calcium, Ionized, Serum: 4.9 mg/dL (ref 4.5–5.6)

## 2023-09-21 MED ORDER — MUSCLE RUB 10-15 % EX CREA: 1.0000 | TOPICAL_CREAM | CUTANEOUS | Status: DC | PRN

## 2023-09-21 MED ORDER — HEPARIN SODIUM (PORCINE) 5000 UNIT/ML IJ SOLN
5000.0000 [IU] | Freq: Three times a day (TID) | INTRAMUSCULAR | Status: DC
  Administered 2023-09-21 – 2023-09-25 (×12): 5000 [IU] via SUBCUTANEOUS
  Filled 2023-09-21 (×12): qty 1

## 2023-09-21 MED ORDER — SALINE SPRAY 0.65 % NA SOLN: 1.0000 | NASAL | Status: DC | PRN

## 2023-09-21 MED ORDER — ACETAMINOPHEN 325 MG PO TABS
650.0000 mg | ORAL_TABLET | Freq: Four times a day (QID) | ORAL | Status: DC | PRN
  Filled 2023-09-21: qty 2

## 2023-09-21 MED ORDER — FUROSEMIDE 10 MG/ML IJ SOLN
20.0000 mg | Freq: Two times a day (BID) | INTRAMUSCULAR | Status: DC
  Administered 2023-09-21 – 2023-09-22 (×3): 20 mg via INTRAVENOUS
  Filled 2023-09-21 (×3): qty 2

## 2023-09-21 MED ORDER — ROSUVASTATIN CALCIUM 10 MG PO TABS
10.0000 mg | ORAL_TABLET | Freq: Every day | ORAL | Status: DC
  Administered 2023-09-21 – 2023-09-25 (×5): 10 mg via ORAL
  Filled 2023-09-21 (×5): qty 1

## 2023-09-21 MED ORDER — FUROSEMIDE 10 MG/ML IJ SOLN
20.0000 mg | Freq: Once | INTRAMUSCULAR | Status: AC
  Administered 2023-09-21: 20 mg via INTRAVENOUS
  Filled 2023-09-21: qty 4

## 2023-09-21 MED ORDER — FERROUS SULFATE 325 (65 FE) MG PO TABS
325.0000 mg | ORAL_TABLET | Freq: Every day | ORAL | Status: DC
  Administered 2023-09-22 – 2023-09-25 (×4): 325 mg via ORAL
  Filled 2023-09-21 (×4): qty 1

## 2023-09-21 MED ORDER — ASPIRIN 81 MG PO CHEW
81.0000 mg | CHEWABLE_TABLET | Freq: Every day | ORAL | Status: DC
  Administered 2023-09-21 – 2023-09-24 (×4): 81 mg via ORAL
  Filled 2023-09-21 (×4): qty 1

## 2023-09-21 MED ORDER — SERTRALINE HCL 50 MG PO TABS
75.0000 mg | ORAL_TABLET | Freq: Every day | ORAL | Status: DC
  Administered 2023-09-21 – 2023-09-22 (×2): 50 mg via ORAL
  Administered 2023-09-23 – 2023-09-25 (×3): 75 mg via ORAL
  Filled 2023-09-21 (×5): qty 2

## 2023-09-21 NOTE — Plan of Care (Signed)

## 2023-09-21 NOTE — Progress Notes (Signed)
 BP 102/66   Pulse (!) 101   Temp 98.3 F (36.8 C) (Oral)   Ht 4\' 6"  (1.372 m)   Wt 129 lb 6.4 oz (58.7 kg)   SpO2 98%   BMI 31.20 kg/m    Subjective:    Patient ID: Felicia Robinson, female    DOB: 05/02/1945, 78 y.o.   MRN: [id number]  HPI: Felicia Robinson is a 78 y.o. female  Chief Complaint  Patient presents with   Hospitalization Follow-up    Discharged from [facility] 09/20/23- acute kidney injury    Son at bedside  Transition of Care Hospital Follow up.  Admitted on 09/18/23 to [facility] after seeing oncology and they noted abnormal vital signs.  Sent to ER for dehydration. Was discharged 09/20/23.  Since returning home she has had increased edema to both legs and abdomen, noted the most last night.  Having some shortness of breath with this. No chest pain.  Her son is at bedside and endorses he has noticed she is more short of breath, even when resting.  BP medications were placed on hold while in hospital due to AKI and hypotension, she has not restarted.  Right thyroid nodule noted on recent chest CT, to have ultrasound outpatient.  Currently followed by oncology due to new finding of bony lytic lesions while in ER on 5/25.  She has underlying rickets and history of breast cancer.  Over last several months has had off and on bouts of diarrhea.  Currently is becoming more constipated in past 24 to 36 hours and notices she has been more gassy since her abdomen started becoming distended.  " Hospital Course: Felicia Robinson is a 78 y.o. female with medical history significant of stage IIIa CKD, X-linked hypophosphatemic rickets follows with [facility], osteoporosis, anemia, B12 deficiency, grief reaction of sertraline , mixed hyperlipidemia, vitamin D  deficiency, history of breast cancer, hypertension. On May 11, pt reports she syncopized at home and was brought to ED in [facility]. She reports she saw a PA at that time, received a course of Keflex for UTI, IVF and was sent home. She  completed her course of antibiotics but has had persistent nausea/vomiting so she presented to our ED on 5/25.  During ED visit on 5/25 patient had CT abdomen pelvis which showed no acute abnormalities but did note diffuse bony lytic lesions which patient felt were related to rickets.  Patient was referred to oncology for further workup.  In the oncology office 5/30 she complained of generalized fatigue, blood pressure was 86/62 so she was recommended to present to the hospital for further workup. Patient reports since May 11 she has had ongoing nausea, vomiting.  She also endorses chronic diarrhea over the last many months that does not happen daily but spontaneously without clear trigger.  Recently, she has had anorexia due to fear of vomiting.  No fevers at home. Patient lives alone and has no sick contacts.  In the ED initial BP was 83/60 but fluid responsive after 1L NS to 133/70.  Afebrile.  Labs revealed AKI, hypokalemia, hypocalcemia.  She had mild leukocytosis of 12.6.  She was admitted for fluid resuscitation. Gradually her AKI resolved and trended towards normal.  She endorsed significant improvement in nausea vomiting and was able to tolerate p.o. for a full 24 hours prior to discharge.  Workup was largely unremarkable.  No identified source of infection, GI PCR also negative. Regarding the diffuse lytic lesions seen on CT abdomen, I discussed directly  with Dr. Wilhelmenia Harada of oncology who reports no further inpatient workup necessary.  Multiple myeloma panel was sent on day of admission and is still pending at this time.  Peripheral smear was unremarkable on WBC morphology but did reveal increased platelet count.  Chest CT this admission does not reveal any primary source of malignancy or other acute findings.  Did reveal incidental thyroid nodule that should be better evaluated with ultrasound outpatient.  TSH within normal limits Leukocytosis resolved with IV fluids, no indication of infection.  Likely  reactive given profound dehydration.   Discussed care plan extensively with the patient and her son, Rodman Clam, at bedside.  They will pursue outpatient workup with oncology team and have follow-up planned with PCP tomorrow.     AKI on CKD - Prerenal secondary to vomiting, nausea, anorexia - Baseline creatinine 1.1, elevated to 2.4 on arrival.  Has downtrended to 1.23 - Status post IV fluids - Encourage p.o. intake.  Nausea vomiting resolved - Did receive CT with contrast today, may have small bump in creatinine tomorrow. - Follow-up with PCP in 1 week for repeat BMP to ensure creatinine is resolving to baseline   Nausea, vomiting - Intermittent and ongoing for the last 3 weeks - COVID/RSV/flu negative. - TSH and hemoglobin A1c unremarkable - Unclear etiology. appears to be resolving - Tolerating diet now - Rx for Zofran  at discharge   Chronic diarrhea - Patient endorses intermittent and consistent diarrhea for the last many months - GI PCR negative - C. difficile still pending at time of discharge - Would benefit from outpatient follow-up with GI   Diffuse lytic lesions -CT abdomen pelvis 5/25: Chronic 6 mm hepatic lobe lesion, stable in size.  3 x 2.5 cm well-circumscribed fluid density lesion above the right gluteal musculature, small volume ascites containing umbilical hernia, small fat-containing right inguinal hernia.  Diffusely heterogenous axial and appendicular skeleton with multiple underlying lytic lesions, redemonstration of a chronic S3 sacral foramina fluid density lesion expanding diaphragm and is suggestive of a Tarlov cyst. - Discussed with Dr. Wilhelmenia Harada from oncology.  Reports multiple myeloma panel has already been sent.   -Peripheral smear review is unremarkable WBC morphology.  Increased platelet count. - Chest CT 6/1 without primary lesion.  No significant adenopathy - Patient will need PET scan outpatient.   Leukocytosis - WBC 12.6 on arrival, unchanged from prior - No  signs or symptoms of infection.   - Low suspicion for infection.  Patient has been afebrile.  Leukocytosis has resolved without intervention - Suspect this was reactive from dehydration -urine culture from 5/25 growing 30,000 colonies lactobacillus -- Pt completed course of Keflex - denies any LUTS at this time - CXR unremarkable   Thrombocytosis - Platelets 472 on arrival-has now resolved and normal - Suspect this was secondary to AKI/dehydration   History of breast cancer, 2013.  In remission since 2018. - Status post left mastectomy -- Has had annual mammograms without recurrence.   X-linked hypophosphatemia rickets - Follows with [facility] endocrinology - History of bilateral femoral stress fractures 2019, tibial fracture 2021 - Receives Cryvista injections monthly.  Scheduled for next dose this week.  Have advised to follow-up with oncology first for continuing   B12 deficiency - Continue outpatient supplements   History of iron deficiency anemia - Hemoglobin 12.1 on arrival although I do suspect this was hemoconcentrated. - Has downtrended to 10.2 which is likely closer to her baseline - Continue supplementation with iron and B12 at discharge   Grief  reaction - Continue sertraline  50mg  dose which patient is taking at home   Mixed hyperlipidemia - Continue statin   Hypertension - Pressure has been low/normotensive.  Hold home antihypertensives for now.  Will resume as needed   Hypokalemia Hypoalbuminemia Hypocalcemia Hypophosphatemia Hypomagnesemia - Ionized calcium  ordered, pending.  Follows with endocrinology - PCP follow-up for repeat CMP this week   Elevated alk phos - Baseline 180s.  Alk phos currently improved beyond baseline - No further LFT elevation     Thyroid nodule - 1.7 cm right thyroid nodule seen on chest CT.  Outpatient follow-up for thyroid ultrasound - TSH within normal limits"  Hospital/Facility: [facility] D/C Physician: Dr. Marquette Sites D/C Date:  09/20/23  Records Requested: 09/21/23 Records Received: 09/21/23 Records Reviewed: 09/21/23  Diagnoses on Discharge: AKI  Date of interactive Contact within 48 hours of discharge:  Contact was through: phone  Date of 7 day or 14 day face-to-face visit:    within 7 days  Outpatient Encounter Medications as of 09/21/2023  Medication Sig Note   aspirin 81 MG chewable tablet Chew 81 mg by mouth daily.    Cholecalciferol (VITAMIN D3) 50 MCG (2000 UT) TABS Take 2,000 Units by mouth daily.    Cyanocobalamin  (VITAMIN B12 PO) Take 3,000 mcg by mouth daily.    Ferrous Sulfate (SLOW FE) 142 (45 Fe) MG TBCR Take 1 tablet by mouth daily. Take with a little orange juice.    ondansetron  (ZOFRAN -ODT) 4 MG disintegrating tablet Take 1 tablet (4 mg total) by mouth every 8 (eight) hours as needed for nausea or vomiting.    rosuvastatin  (CRESTOR ) 5 MG tablet TAKE 1 TABLET(5 MG) BY MOUTH DAILY    sertraline  (ZOLOFT ) 50 MG tablet Take 1.5 tablets (75 mg total) by mouth daily.    [Paused] Nebivolol  HCl 20 MG TABS TAKE 1 TABLET(20 MG) BY MOUTH DAILY (Patient not taking: Reported on 09/21/2023)    [Paused] olmesartan -hydrochlorothiazide (BENICAR  HCT) 40-12.5 MG tablet Take 1 tablet by mouth daily. (Patient not taking: Reported on 09/21/2023)    [DISCONTINUED] Burosumab -twza (CRYSVITA ) 30 MG/ML SOLN Inject 30 mg into the skin every 30 (thirty) days. (Patient not taking: Reported on 09/18/2023)    [DISCONTINUED] cephALEXin (KEFLEX) 500 MG capsule Take 500 mg by mouth 3 (three) times daily. (Patient not taking: Reported on 09/18/2023)    [DISCONTINUED] ibuprofen (ADVIL) 200 MG tablet Take 200 mg by mouth every 6 (six) hours as needed. 09/18/2023: prn   [DISCONTINUED] ondansetron  (ZOFRAN -ODT) 4 MG disintegrating tablet Take 4 mg by mouth every 8 (eight) hours as needed. (Patient not taking: Reported on 09/18/2023)    [EXPIRED] magnesium sulfate IVPB 4 g 100 mL     [EXPIRED] potassium & sodium phosphates (PHOS-NAK) 280-160-250 MG  packet 2 packet     [DISCONTINUED] acetaminophen  (TYLENOL ) tablet 650 mg     [DISCONTINUED] heparin  injection 5,000 Units     [DISCONTINUED] hydrALAZINE  (APRESOLINE ) injection 10 mg     [DISCONTINUED] HYDROcodone -acetaminophen  (NORCO/VICODIN) 5-325 MG per tablet 1 tablet     [DISCONTINUED] ipratropium-albuterol (DUONEB) 0.5-2.5 (3) MG/3ML nebulizer solution 3 mL     [DISCONTINUED] metoCLOPramide  (REGLAN ) injection 5 mg     [DISCONTINUED] Muscle Rub CREA 1 Application     [DISCONTINUED] ondansetron  (ZOFRAN -ODT) disintegrating tablet 4 mg     [DISCONTINUED] sertraline  (ZOLOFT ) tablet 50 mg     [DISCONTINUED] sodium chloride  (OCEAN) 0.65 % nasal spray 1 spray     No facility-administered encounter medications on file as of 09/21/2023.    Diagnostic Tests  Reviewed/Disposition:     Latest Ref Rng & Units 09/20/2023    6:42 AM 09/19/2023    7:00 AM 09/18/2023   12:30 PM  CBC  WBC 4.0 - 10.5 K/uL 9.1  8.6  12.6   Hemoglobin 12.0 - 15.0 g/dL 16.1  09.6  04.5   Hematocrit 36.0 - 46.0 % 32.0  31.3  37.0   Platelets 150 - 400 K/uL 388  323  472        Latest Ref Rng & Units 09/20/2023    6:42 AM 09/19/2023    7:00 AM 09/18/2023   12:30 PM  CMP  Glucose 70 - 99 mg/dL 409  96  811   BUN 8 - 23 mg/dL 27  37  46   Creatinine 0.44 - 1.00 mg/dL 9.14  7.82  9.56   Sodium 135 - 145 mmol/L 135  135  130   Potassium 3.5 - 5.1 mmol/L 3.5  3.7  3.3   Chloride 98 - 111 mmol/L 109  108  100   CO2 22 - 32 mmol/L 19  19  19    Calcium  8.9 - 10.3 mg/dL 7.9  7.8  8.1   Total Protein 6.5 - 8.1 g/dL 5.4  5.2  7.0   Total Bilirubin 0.0 - 1.2 mg/dL 0.5  0.9  0.8   Alkaline Phos 38 - 126 U/L 107  101  152   AST 15 - 41 U/L 18  17  21    ALT 0 - 44 U/L 13  11  15      Consults: oncology  Discharge Instructions:  Follow-up with primary care physician to recheck kidney function in 1 week.  Please note she did receive contrast on day of discharge so she may have slight increase in creatinine before continued  downtrend Follow-up with oncology for PET scan and to receive results of multiple myeloma panel which is still pending at this time Follow-up with primary care to receive thyroid ultrasound to better evaluate thyroid nodule incidentally seen on chest CT  Disease/illness Education: Reviewed with son and patient today  Home Health/Community Services Discussions/Referrals: none  Establishment or re-establishment of referral orders for community resources: none  Discussion with other health care providers: Reviewed all recent notes  Assessment and Support of treatment regimen adherence: Reviewed with son and patient today  Appointments Coordinated with: Reviewed with son and patient today  Education for self-management, independent living, and ADLs:  Reviewed with son and patient today  Relevant past medical, surgical, family and social history reviewed and updated as indicated. Interim medical history since our last visit reviewed. Allergies and medications reviewed and updated.  Review of Systems  Constitutional:  Positive for activity change, appetite change and fatigue. Negative for diaphoresis and fever.  Respiratory:  Positive for cough (occasional), chest tightness, shortness of breath and wheezing.   Cardiovascular:  Positive for leg swelling. Negative for chest pain and palpitations.  Gastrointestinal:  Positive for abdominal distention and constipation. Negative for abdominal pain, diarrhea, nausea and vomiting.  Musculoskeletal:  Positive for arthralgias.  Neurological:  Negative for dizziness, weakness, light-headedness, numbness and headaches.  Psychiatric/Behavioral: Negative.     Per HPI unless specifically indicated above     Objective:     BP 102/66   Pulse (!) 101   Temp 98.3 F (36.8 C) (Oral)   Ht 4\' 6"  (1.372 m)   Wt 129 lb 6.4 oz (58.7 kg)   SpO2 98%   BMI 31.20 kg/m   Wt  Readings from Last 3 Encounters:  09/21/23 129 lb 6.4 oz (58.7 kg)  09/21/23 129  lb 6.4 oz (58.7 kg)  09/18/23 116 lb (52.6 kg)    Physical Exam Vitals and nursing note reviewed.  Constitutional:      General: She is awake. She is not in acute distress.    Appearance: She is well-developed and well-groomed. She is obese. She is not ill-appearing or toxic-appearing.  HENT:     Head: Normocephalic.     Right Ear: Hearing and external ear normal.     Left Ear: Hearing and external ear normal.  Eyes:     General: Lids are normal.        Right eye: No discharge.        Left eye: No discharge.     Conjunctiva/sclera: Conjunctivae normal.     Pupils: Pupils are equal, round, and reactive to light.  Neck:     Thyroid: No thyromegaly.     Vascular: JVD (mild) present. No carotid bruit.  Cardiovascular:     Rate and Rhythm: Normal rate and regular rhythm. No extrasystoles are present.    Heart sounds: Normal heart sounds. No murmur heard.    No gallop. No S3 or S4 sounds.  Pulmonary:     Effort: Pulmonary effort is normal. No accessory muscle usage or respiratory distress.     Breath sounds: Normal breath sounds. No decreased breath sounds, wheezing or rales.  Abdominal:     General: Bowel sounds are normal. There is distension. There is no abdominal bruit.     Palpations: Abdomen is soft.     Tenderness: There is no abdominal tenderness. There is no right CVA tenderness or left CVA tenderness.     Comments: Abdomen distended and firm to upper quadrants.  Musculoskeletal:     Cervical back: Normal range of motion and neck supple.     Right lower leg: 3+ Edema present.     Left lower leg: 3+ Edema present.  Lymphadenopathy:     Cervical: No cervical adenopathy.  Skin:    General: Skin is warm and dry.  Neurological:     Mental Status: She is alert and oriented to person, place, and time.     Deep Tendon Reflexes: Reflexes are normal and symmetric.     Reflex Scores:      Brachioradialis reflexes are 2+ on the right side and 2+ on the left side.      Patellar  reflexes are 2+ on the right side and 2+ on the left side. Psychiatric:        Attention and Perception: Attention normal.        Mood and Affect: Mood normal.        Speech: Speech normal.        Behavior: Behavior normal. Behavior is cooperative.        Thought Content: Thought content normal.    Results for orders placed or performed during the hospital encounter of 09/18/23  Resp panel by RT-PCR (RSV, Flu A&B, Covid) Anterior Nasal Swab   Collection Time: 09/18/23  8:02 PM   Specimen: Anterior Nasal Swab  Result Value Ref Range   SARS Coronavirus 2 by RT PCR NEGATIVE NEGATIVE   Influenza A by PCR NEGATIVE NEGATIVE   Influenza B by PCR NEGATIVE NEGATIVE   Resp Syncytial Virus by PCR NEGATIVE NEGATIVE  Urinalysis, w/ Reflex to Culture (Infection Suspected) -Urine, Clean Catch   Collection Time: 09/18/23  8:02 PM  Result  Value Ref Range   Specimen Source URINE, CLEAN CATCH    Color, Urine YELLOW (A) YELLOW   APPearance HAZY (A) CLEAR   Specific Gravity, Urine 1.012 1.005 - 1.030   pH 5.0 5.0 - 8.0   Glucose, UA NEGATIVE NEGATIVE mg/dL   Hgb urine dipstick NEGATIVE NEGATIVE   Bilirubin Urine NEGATIVE NEGATIVE   Ketones, ur NEGATIVE NEGATIVE mg/dL   Protein, ur NEGATIVE NEGATIVE mg/dL   Nitrite NEGATIVE NEGATIVE   Leukocytes,Ua NEGATIVE NEGATIVE   RBC / HPF 0-5 0 - 5 RBC/hpf   WBC, UA 0-5 0 - 5 WBC/hpf   Bacteria, UA FEW (A) NONE SEEN   Squamous Epithelial / HPF 0-5 0 - 5 /HPF   Mucus PRESENT    Hyaline Casts, UA PRESENT   Magnesium   Collection Time: 09/19/23  7:00 AM  Result Value Ref Range   Magnesium 1.5 (L) 1.7 - 2.4 mg/dL  CBC with Differential/Platelet   Collection Time: 09/19/23  7:00 AM  Result Value Ref Range   WBC 8.6 4.0 - 10.5 K/uL   RBC 3.54 (L) 3.87 - 5.11 MIL/uL   Hemoglobin 10.3 (L) 12.0 - 15.0 g/dL   HCT 16.1 (L) 09.6 - 04.5 %   MCV 88.4 80.0 - 100.0 fL   MCH 29.1 26.0 - 34.0 pg   MCHC 32.9 30.0 - 36.0 g/dL   RDW 40.9 81.1 - 91.4 %   Platelets  323 150 - 400 K/uL   nRBC 0.0 0.0 - 0.2 %   Neutrophils Relative % 79 %   Neutro Abs 6.8 1.7 - 7.7 K/uL   Lymphocytes Relative 11 %   Lymphs Abs 1.0 0.7 - 4.0 K/uL   Monocytes Relative 8 %   Monocytes Absolute 0.7 0.1 - 1.0 K/uL   Eosinophils Relative 2 %   Eosinophils Absolute 0.2 0.0 - 0.5 K/uL   Basophils Relative 0 %   Basophils Absolute 0.0 0.0 - 0.1 K/uL   Immature Granulocytes 0 %   Abs Immature Granulocytes 0.03 0.00 - 0.07 K/uL  Comprehensive metabolic panel with GFR   Collection Time: 09/19/23  7:00 AM  Result Value Ref Range   Sodium 135 135 - 145 mmol/L   Potassium 3.7 3.5 - 5.1 mmol/L   Chloride 108 98 - 111 mmol/L   CO2 19 (L) 22 - 32 mmol/L   Glucose, Bld 96 70 - 99 mg/dL   BUN 37 (H) 8 - 23 mg/dL   Creatinine, Ser 7.82 (H) 0.44 - 1.00 mg/dL   Calcium  7.8 (L) 8.9 - 10.3 mg/dL   Total Protein 5.2 (L) 6.5 - 8.1 g/dL   Albumin 2.3 (L) 3.5 - 5.0 g/dL   AST 17 15 - 41 U/L   ALT 11 0 - 44 U/L   Alkaline Phosphatase 101 38 - 126 U/L   Total Bilirubin 0.9 0.0 - 1.2 mg/dL   GFR, Estimated 30 (L) >60 mL/min   Anion gap 8 5 - 15  Phosphorus   Collection Time: 09/19/23  7:00 AM  Result Value Ref Range   Phosphorus 2.3 (L) 2.5 - 4.6 mg/dL  TSH   Collection Time: 09/19/23  7:00 AM  Result Value Ref Range   TSH 1.194 0.350 - 4.500 uIU/mL  Hemoglobin A1c   Collection Time: 09/19/23  7:00 AM  Result Value Ref Range   Hgb A1c MFr Bld 5.6 4.8 - 5.6 %   Mean Plasma Glucose 114.02 mg/dL  Gastrointestinal Panel by PCR , Stool   Collection  Time: 09/20/23  4:34 AM   Specimen: Stool  Result Value Ref Range   Campylobacter species NOT DETECTED NOT DETECTED   Plesimonas shigelloides NOT DETECTED NOT DETECTED   Salmonella species NOT DETECTED NOT DETECTED   Yersinia enterocolitica NOT DETECTED NOT DETECTED   Vibrio species NOT DETECTED NOT DETECTED   Vibrio cholerae NOT DETECTED NOT DETECTED   Enteroaggregative E coli (EAEC) NOT DETECTED NOT DETECTED   Enteropathogenic E  coli (EPEC) NOT DETECTED NOT DETECTED   Enterotoxigenic E coli (ETEC) NOT DETECTED NOT DETECTED   Shiga like toxin producing E coli (STEC) NOT DETECTED NOT DETECTED   Shigella/Enteroinvasive E coli (EIEC) NOT DETECTED NOT DETECTED   Cryptosporidium NOT DETECTED NOT DETECTED   Cyclospora cayetanensis NOT DETECTED NOT DETECTED   Entamoeba histolytica NOT DETECTED NOT DETECTED   Giardia lamblia NOT DETECTED NOT DETECTED   Adenovirus F40/41 NOT DETECTED NOT DETECTED   Astrovirus NOT DETECTED NOT DETECTED   Norovirus GI/GII NOT DETECTED NOT DETECTED   Rotavirus A NOT DETECTED NOT DETECTED   Sapovirus (I, II, IV, and V) NOT DETECTED NOT DETECTED  Magnesium   Collection Time: 09/20/23  6:42 AM  Result Value Ref Range   Magnesium 2.5 (H) 1.7 - 2.4 mg/dL  CBC with Differential/Platelet   Collection Time: 09/20/23  6:42 AM  Result Value Ref Range   WBC 9.1 4.0 - 10.5 K/uL   RBC 3.63 (L) 3.87 - 5.11 MIL/uL   Hemoglobin 10.2 (L) 12.0 - 15.0 g/dL   HCT 42.5 (L) 95.6 - 38.7 %   MCV 88.2 80.0 - 100.0 fL   MCH 28.1 26.0 - 34.0 pg   MCHC 31.9 30.0 - 36.0 g/dL   RDW 56.4 33.2 - 95.1 %   Platelets 388 150 - 400 K/uL   nRBC 0.0 0.0 - 0.2 %   Neutrophils Relative % 80 %   Neutro Abs 7.2 1.7 - 7.7 K/uL   Lymphocytes Relative 10 %   Lymphs Abs 0.9 0.7 - 4.0 K/uL   Monocytes Relative 8 %   Monocytes Absolute 0.7 0.1 - 1.0 K/uL   Eosinophils Relative 2 %   Eosinophils Absolute 0.2 0.0 - 0.5 K/uL   Basophils Relative 0 %   Basophils Absolute 0.0 0.0 - 0.1 K/uL   Immature Granulocytes 0 %   Abs Immature Granulocytes 0.03 0.00 - 0.07 K/uL  Comprehensive metabolic panel with GFR   Collection Time: 09/20/23  6:42 AM  Result Value Ref Range   Sodium 135 135 - 145 mmol/L   Potassium 3.5 3.5 - 5.1 mmol/L   Chloride 109 98 - 111 mmol/L   CO2 19 (L) 22 - 32 mmol/L   Glucose, Bld 121 (H) 70 - 99 mg/dL   BUN 27 (H) 8 - 23 mg/dL   Creatinine, Ser 8.84 (H) 0.44 - 1.00 mg/dL   Calcium  7.9 (L) 8.9 - 10.3  mg/dL   Total Protein 5.4 (L) 6.5 - 8.1 g/dL   Albumin 2.4 (L) 3.5 - 5.0 g/dL   AST 18 15 - 41 U/L   ALT 13 0 - 44 U/L   Alkaline Phosphatase 107 38 - 126 U/L   Total Bilirubin 0.5 0.0 - 1.2 mg/dL   GFR, Estimated 45 (L) >60 mL/min   Anion gap 7 5 - 15  Phosphorus   Collection Time: 09/20/23  6:42 AM  Result Value Ref Range   Phosphorus 3.1 2.5 - 4.6 mg/dL      Assessment & Plan:   Problem  List Items Addressed This Visit       Cardiovascular and Mediastinum   Essential hypertension   Currently remains hypotensive.  Will keep medications on hold.  Recommend she monitor BP at least a few mornings a week at home and document.  DASH diet at home.         Acute HF (heart failure) (HCC)   Suspect this is present based on symptoms and risk factors recently.  Assessment notes edema to both lower extremities and abdomen.  This is not baseline for her.  She has gained 13 pounds since discharge from hospital. Have recommended return to ER for further assessment - echo and repeat labs, to ensure no worsening.  Concern for worsening due to recent finding of lytic lesions and ascites noted on CT.  History of breast cancer, higher risk.        Musculoskeletal and Integument   Bone lesion (Chronic)   Noted on imaging 09/13/23.  Higher risk due to history of breast cancer. Having some acute HF symptoms on exam today.  Have recommended return to ER for further assessment - echo and repeat labs, to ensure no worsening.  Concern for worsening due to recent finding of lytic lesions and ascites noted on CT.  Discussed with son and her, they agree with this plan of care as are concerned with the acute changes.        Genitourinary   AKI (acute kidney injury) (HCC) - Primary (Chronic)   Acute, discharged yesterday from hospital, but having some acute HF symptoms on exam today.  Have recommended return to ER for further assessment - echo and repeat labs, to ensure no worsening.  Concern for worsening  due to recent finding of lytic lesions and ascites noted on CT.  History of breast cancer, higher risk.       Time: 25 minutes, >50% spent counseling/or care coordination   Follow up plan: Return in about 1 year (around 09/20/2024) for Hospital follow-up.

## 2023-09-21 NOTE — ED Notes (Signed)
 Fall precautions in place for Pt. This RN placed fall band, fall grip socks, bed alarm and fall sign.

## 2023-09-21 NOTE — Assessment & Plan Note (Signed)
 Noted on imaging 09/13/23.  Higher risk due to history of breast cancer. Having some acute HF symptoms on exam today.  Have recommended return to ER for further assessment - echo and repeat labs, to ensure no worsening.  Concern for worsening due to recent finding of lytic lesions and ascites noted on CT.  Discussed with son and her, they agree with this plan of care as are concerned with the acute changes.

## 2023-09-21 NOTE — Transitions of Care (Post Inpatient/ED Visit) (Signed)
   09/21/2023  Name: Felicia Robinson MRN: [id number] DOB: [birth year]  Today's TOC FU Call Status: Today's TOC FU Call Status:: Successful TOC FU Call Completed TOC FU Call Complete Date: 09/21/23 Patient's Name and Date of Birth confirmed.  Transition Care Management Follow-up Telephone Call Date of Discharge: 09/20/23 Discharge Facility: [facility]) Type of Discharge: Inpatient Admission Primary Inpatient Discharge Diagnosis:: dehydration How have you been since you were released from the hospital?: Same Any questions or concerns?: Yes Patient Questions/Concerns:: wants home health services Patient Questions/Concerns Addressed: Notified Provider of Patient Questions/Concerns  Items Reviewed: Did you receive and understand the discharge instructions provided?: Yes Medications obtained,verified, and reconciled?: Yes (Medications Reviewed) Any new allergies since your discharge?: No Dietary orders reviewed?: Yes Do you have support at home?: Yes People in Home [RPT]: child(ren), adult  Medications Reviewed Today: Medications Reviewed Today     Reviewed by Darrall Ellison, LPN (Licensed Practical Nurse) on 09/21/23 at [phone]  Med List Status: <None>   Medication Order Taking? Sig Documenting Provider Last Dose Status Informant  aspirin 81 MG chewable tablet [id number] No Chew 81 mg by mouth daily. [provider] 09/14/2023 Active Self, Child, Pharmacy Records  Cholecalciferol (VITAMIN D3) 50 MCG (2000 UT) TABS [id number] No Take 2,000 Units by mouth daily. [provider] 09/14/2023 Active Self, Child, Pharmacy Records  Cyanocobalamin  (VITAMIN B12 PO) [id number] No Take 3,000 mcg by mouth daily. [provider] 09/14/2023 Active Self, Child, Pharmacy Records  Ferrous Sulfate (SLOW FE) 142 (45 Fe) MG TBCR [id number] No Take 1 tablet by mouth daily. Take with a little orange juice. Doran Galloway T, NP 09/14/2023 Active Self, Child,  Pharmacy Records  Nebivolol  HCl 20 MG TABS [id number] No TAKE 1 TABLET(20 MG) BY MOUTH DAILY Cannady, Jolene T, NP 09/14/2023 Active Self, Child, Pharmacy Records  olmesartan -hydrochlorothiazide (BENICAR  HCT) 40-12.5 MG tablet [id number] No Take 1 tablet by mouth daily. Doran Galloway T, NP 09/14/2023 Active Self, Child, Pharmacy Records  ondansetron  (ZOFRAN -ODT) 4 MG disintegrating tablet [id number]  Take 1 tablet (4 mg total) by mouth every 8 (eight) hours as needed for nausea or vomiting. Dezii, Alexandra, DO  Active   rosuvastatin  (CRESTOR ) 5 MG tablet [id number] No TAKE 1 TABLET(5 MG) BY MOUTH DAILY Cannady, Jolene T, NP 09/14/2023 Active Self, Child, Pharmacy Records  sertraline  (ZOLOFT ) 50 MG tablet [id number] No Take 1.5 tablets (75 mg total) by mouth daily. Doran Galloway T, NP 09/14/2023 Active Self, Child, Pharmacy Records            Home Care and Equipment/Supplies: Were Home Health Services Ordered?: NA Any new equipment or medical supplies ordered?: NA  Functional Questionnaire: Do you need assistance with bathing/showering or dressing?: Yes Do you need assistance with meal preparation?: Yes Do you need assistance with eating?: No Do you have difficulty maintaining continence: No Do you need assistance with getting out of bed/getting out of a chair/moving?: No Do you have difficulty managing or taking your medications?: Yes  Follow up appointments reviewed: PCP Follow-up appointment confirmed?: Yes Date of PCP follow-up appointment?: 09/21/23 Follow-up Provider: [facility] Follow-up appointment confirmed?: NA Do you need transportation to your follow-up appointment?: No Do you understand care options if your condition(s) worsen?: Yes-patient verbalized understanding    SIGNATURE Darrall Ellison, LPN [facility] Nurse Health Advisor Direct Dial [phone]

## 2023-09-21 NOTE — Assessment & Plan Note (Addendum)
 Currently remains hypotensive.  Will keep medications on hold.  Recommend she monitor BP at least a few mornings a week at home and document.  DASH diet at home.

## 2023-09-21 NOTE — H&P (Signed)
 History and Physical    Patient: Felicia Robinson AVW:[id number] DOB: Jul 19, 1945 DOA: 09/21/2023 DOS: the patient was seen and examined on 09/21/2023 PCP: Lemar Pyles, NP  Patient coming from: Home  Chief Complaint:  Chief Complaint  Patient presents with   Shortness of Breath   HPI: Felicia Robinson is a 78 y.o. female with medical history significant of stage IIIa CKD, X-linked hypophosphatemic rickets follows with [facility], osteoporosis, anemia, B12 deficiency, grief reaction of sertraline , mixed hyperlipidemia, vitamin D  deficiency, history of breast cancer, hypertension. Patient was admitted to the hospital 5/30 through 6/1 with prerenal dehydration induced AKI secondary to nausea, vomiting, diarrhea.  While admitted she received 24 hours of IV fluids.  At time of discharge kidney function was gradually improving and patient had improved to her physiologic baseline. When patient returned home she noted increased swelling in her lower extremities and at PCP follow-up today she was found to be overtly volume overloaded.  It was recommended she return to the ER. Of note patient has recently diagnosed lytic lesions.  Workup is ongoing at this time.  Multiple myeloma panel has been sent and is still pending.  Patient has outpatient follow-up with oncology and scheduled PET scan tomorrow.   On arrival to the ED this admission patient is found to have 1+ pitting edema in her lower extremities, and abdominal distention.  She reports she is currently 13 pounds above her normal weight.  Labs revealed BNP 187, WBC 12.9, creatinine 1.7.  Chest x-ray with small left pleural effusion, mild cardiomegaly.  Lower extremity Dopplers are negative for DVT.  Patient is stable on room air but endorses abdominal discomfort due to the swelling.  She denies any chest pain.  She has had no fever.  Review of Systems: As mentioned in the history of present illness. All other systems reviewed and are negative. Past  Medical History:  Diagnosis Date   Arthritis    Breast cancer (HCC) 2013   left breast   Cancer (HCC) 2013   L breast   Depression 2021   Disorders of phosphorus metabolism    rickets   Fibroadenosis of breast    Gout 2013   Hypertension 2009   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 ER 90%, PR 1%, HER-2/neu not overexpressing.   Osteomalacia    Renal disorder    CKD early stage   Rickets    congenital   Shingles 2013   Past Surgical History:  Procedure Laterality Date   BREAST SURGERY Left 09/30/2011   Left simple mastectomy with sentinel node biopsy   CATARACT EXTRACTION, BILATERAL Bilateral 04/2015   Dr. Demetrios Finders in [place]   COLONOSCOPY  2012   DENTAL SURGERY  03/25/2023   DILATION AND CURETTAGE OF UTERUS  1982   MASTECTOMY Left 2013   with SN biopsy   TONSILLECTOMY  1953   UPPER GI ENDOSCOPY  2012   Social History:  reports that she quit smoking about 17 years ago. Her smoking use included cigarettes. She started smoking about 47 years ago. She has a 15 pack-year smoking history. She has never used smokeless tobacco. She reports current alcohol use of about 1.0 standard drink of alcohol per week. She reports that she does not use drugs.  Allergies  Allergen Reactions   Allopurinol      diarrhea   Codeine Nausea And Vomiting   Other     Pain  medications Altered mental status   Shellfish Allergy Diarrhea and Nausea And Vomiting    Just when eating clams Just when eating clams Just when eating clams     Family History  Problem Relation Age of Onset   Alzheimer's disease Mother    Stroke Father    Lymphoma Brother 55       Hodgkin's Lymphoma   Cancer Brother        non hodgkins lymphoma    Heart disease Brother    Cancer Maternal Uncle 81       cancer of the esophagus   Cancer Paternal Uncle    Heart disease Maternal Grandfather    Rickets Son    Breast  cancer Neg Hx     Prior to Admission medications   Medication Sig Start Date End Date Taking? Authorizing Provider  aspirin 81 MG chewable tablet Chew 81 mg by mouth daily.   Yes [provider]  Cholecalciferol (VITAMIN D3) 50 MCG (2000 UT) TABS Take 2,000 Units by mouth daily.   Yes [provider]  Cyanocobalamin  (VITAMIN B12 PO) Take 3,000 mcg by mouth daily.   Yes [provider]  Ferrous Sulfate (SLOW FE) 142 (45 Fe) MG TBCR Take 1 tablet by mouth daily. Take with a little orange juice. 03/26/21  Yes Cannady, Jolene T, NP  rosuvastatin  (CRESTOR ) 5 MG tablet TAKE 1 TABLET(5 MG) BY MOUTH DAILY 05/25/23  Yes Cannady, Jolene T, NP  sertraline  (ZOLOFT ) 50 MG tablet Take 1.5 tablets (75 mg total) by mouth daily. 11/11/22  Yes Cannady, Jolene T, NP  Nebivolol  HCl 20 MG TABS TAKE 1 TABLET(20 MG) BY MOUTH DAILY Patient not taking: Reported on 09/21/2023 05/25/23   Cannady, Jolene T, NP  olmesartan -hydrochlorothiazide (BENICAR  HCT) 40-12.5 MG tablet Take 1 tablet by mouth daily. Patient not taking: Reported on 09/21/2023 05/25/23   Cannady, Jolene T, NP  ondansetron  (ZOFRAN -ODT) 4 MG disintegrating tablet Take 1 tablet (4 mg total) by mouth every 8 (eight) hours as needed for nausea or vomiting. Patient not taking: Reported on 09/21/2023 09/20/23   Roise Cleaver, DO    Physical Exam: Vitals:   09/21/23 1230 09/21/23 1330 09/21/23 1400 09/21/23 1519  BP: (!) 108/94 119/63 133/75 130/82  Pulse: 88 84 84 94  Resp: (!) 24 (!) 23 (!) 23 18  Temp:    98.5 F (36.9 C)  TempSrc:    Oral  SpO2: 97% 97% 98% 99%  Weight:      Height:       Constitutional:  Normal appearance. Non toxic-appearing.  HENT: Head Normocephalic and atraumatic.  Mucous membranes are moist.  Eyes:  Extraocular intact. Conjunctivae normal. Pupils are equal, round, and reactive to light.  Cardiovascular: Rate and Rhythm: Normal rate and regular rhythm.  Pulmonary: Non labored, symmetric rise of chest wall.   Musculoskeletal:  Normal range of motion.  1+ pitting edema bilateral lower extremities Abdomen: Distended, nontender to palpation. Skin: warm and dry. not jaundiced.  Neurological: No focal deficit present. alert. Oriented. Psychiatric: Mood and Affect congruent.   Data Reviewed:     Latest Ref Rng & Units 09/21/2023   12:29 PM 09/20/2023    6:42 AM 09/19/2023    7:00 AM  BMP  Glucose 70 - 99 mg/dL 469  629  96   BUN 8 - 23 mg/dL 28  27  37   Creatinine 0.44 - 1.00 mg/dL 5.28  4.13  2.44   Sodium 135 - 145 mmol/L 135  135  135   Potassium 3.5 - 5.1 mmol/L 3.8  3.5  3.7   Chloride 98 - 111 mmol/L 106  109  108   CO2 22 - 32 mmol/L 18  19  19    Calcium  8.9 - 10.3 mg/dL 8.1  7.9  7.8       Latest Ref Rng & Units 09/21/2023    3:26 PM 09/21/2023   12:29 PM 09/20/2023    6:42 AM  CBC  WBC 4.0 - 10.5 K/uL 14.0  12.9  9.1   Hemoglobin 12.0 - 15.0 g/dL 01.0  27.2  53.6   Hematocrit 36.0 - 46.0 % 36.5  36.0  32.0   Platelets 150 - 400 K/uL 466  437  388      Assessment and Plan: Volume overload - Patient recently admitted with AKI and received 24 hours of normal saline.  Suspect she has become volume overloaded due to this with some component of diastolic heart failure - Small left pleural effusion on CXR, stable since prior admission.  Patient is on room air comfortable.  No significant crackles appreciated on exam - Echocardiogram ordered and pending - BNP mildly elevated - Has been started 20 mg IV push Lasix in the ED, will continue with 20 mg twice daily - Strict I's and O's - Gentle diuresis for now given AKI.  May require Lasix drip if kidney function is not able to tolerate - Cardiology consult per results of echocardiogram  AKI on CKD - Recently admitted with prerenal injury secondary to vomiting, nausea, anorexia.  Creatinine down trended to 1.23 on 6/1, up trended to 1.7 today.  Patient did receive CT contrast on 6/1 prior to discharge - Will diurese gently with 20 mg IV  Lasix twice daily prerenal secondary to vomiting, nausea, anorexia - If kidney function is not tolerating diuresis will consult nephrology and initiate Lasix drip  Nausea, vomiting - Resolved at this time.  Has been chronic and intermittent for the last 3 weeks - Workup on prior admission reveals COVID/RSV/flu negative.  TSH and hemoglobin A1c unremarkable - She has been tolerating her diet for now - Will continue with as needed Zofran    Chronic diarrhea - Patient endorses intermittent diarrhea for the last many months - GI PCR negative - C. difficile ordered and still pending - Would benefit from outpatient GI follow-up.   Diffuse lytic lesions -CT abdomen pelvis 5/25: Chronic 6 mm hepatic lobe lesion, stable in size.  3 x 2.5 cm well-circumscribed fluid density lesion above the right gluteal musculature, small volume ascites containing umbilical hernia, small fat-containing right inguinal hernia.  Diffusely heterogenous axial and appendicular skeleton with multiple underlying lytic lesions, redemonstration of a chronic S3 sacral foramina fluid density lesion expanding diaphragm and is suggestive of a Tarlov cyst. - Patient has established follow-up with Dr. [facility]. - Multiple myeloma panel sent, pending - Peripheral smear reviewed with unremarkable WBC morphology. - Chest CT 6 mom without primary lesion.  No significant adenopathy - PET scan scheduled for tomorrow  Leukocytosis - WBC 12.9 on arrival.  No infectious signs or symptoms - Was previously elevated on prior admission and downtrended without intervention.  May be reactive - Trend CBC - Urine culture 5/25 with 30,000 colonies of lactobacillus.  Patient completed course of Keflex.  Denies any LUTS - Chest x-ray without infiltrates   Thrombocytosis - Platelets 437 on arrival, 472 on 5/30 which downtrended to normal without intervention. - Leukocytosis and thrombocytosis likely in some  part related to underlying  malignancy/multiple myeloma.  Workup as above.   History of breast cancer, 2013.  In remission since 2018. - Status post left mastectomy -- Has had annual mammograms without recurrence.   X-linked hypophosphatemia rickets - Follows with [facility] endocrinology - History of bilateral femoral stress fractures 2019, tibial fracture 2021 - Receives Cryvista injections monthly.  Scheduled for next dose this week.  Have advised to follow-up with oncology first  before continuing   B12 deficiency - Continue outpatient supplements at discharge   History of iron deficiency anemia - Continue with iron and B12 supplementation at discharge - Hemoglobin appears near baseline at this time - Trend CBC   Grief reaction - Continue sertraline  50mg  dose    Mixed hyperlipidemia - Continue statin   Hypertension - Pressure currently at goal without antihypertensives.  Will monitor.  Resume home meds as needed    Hypoalbuminemia Hypocalcemia - Correct electrolytes as needed - Trend daily CMP  Thyroid nodule - 1.7 cm right thyroid nodule seen on chest CT.  Outpatient follow-up for thyroid ultrasound - TSH within normal limits     Advance Care Planning:   Code Status: Full Code patient and son to discuss CODE STATUS at a later time. They'd prefer full code for now  Consults:   Family Communication: Son, Rodman Clam, at bedside and present for discussion  Severity of Illness: The appropriate patient status for this patient is OBSERVATION. Observation status is judged to be reasonable and necessary in order to provide the required intensity of service to ensure the patient's safety. The patient's presenting symptoms, physical exam findings, and initial radiographic and laboratory data in the context of their medical condition is felt to place them at decreased risk for further clinical deterioration. Furthermore, it is anticipated that the patient will be medically stable for discharge from the hospital within  2 midnights of admission.   Author: Dave Mergen, DO 09/21/2023 3:28 PM  For on call review [email or web address].

## 2023-09-21 NOTE — Assessment & Plan Note (Signed)
 Suspect this is present based on symptoms and risk factors recently.  Assessment notes edema to both lower extremities and abdomen.  This is not baseline for her.  She has gained 13 pounds since discharge from hospital. Have recommended return to ER for further assessment - echo and repeat labs, to ensure no worsening.  Concern for worsening due to recent finding of lytic lesions and ascites noted on CT.  History of breast cancer, higher risk.

## 2023-09-21 NOTE — ED Triage Notes (Signed)
 Pt Arrives via EMS from PCP office Pt having episodes of SOB, comes and goes EMS assessment: edema BLLE, and distended belly  Pt states Sx started last night, fine when she is at rest Denies recent trauma and falls Vitals: 127/80, HR 95, 96% RA, ETCO229, RR 18 EMS interventions: BLS  MedHx: Dx w/recent UTI, rickets, Early stage kidney Dz

## 2023-09-21 NOTE — Telephone Encounter (Signed)
 Noted

## 2023-09-21 NOTE — Assessment & Plan Note (Signed)
 Acute, discharged yesterday from hospital, but having some acute HF symptoms on exam today.  Have recommended return to ER for further assessment - echo and repeat labs, to ensure no worsening.  Concern for worsening due to recent finding of lytic lesions and ascites noted on CT.  History of breast cancer, higher risk.

## 2023-09-21 NOTE — ED Provider Notes (Signed)
 [facility] Provider Note    Event Date/Time   First MD Initiated Contact with Patient 09/21/23 1210     (approximate)   History   Shortness of Breath   HPI  Felicia Robinson is a 78 y.o. female with history of hypertension, anemia, CKD, presenting with bilateral lower extremity swelling as well as abdominal distention for 1 day.  Also with shortness of breath.  She denies any chest pain or cough.  States that she was treated for UTI and completed antibiotics for that.  States that she has no nausea, vomiting, abdominal pain, back pain.  Does have history of chronic diarrhea.  Per independent history from EMS, send in her PCP office for intermittent shortness of breath and bilateral lower extremity edema as well as abdominal distention, was satting 96% on room air, heart rate was 95, BP was 127/80.  On independent chart review, she was discharged yesterday from the hospital after she was hospitalized for AKI and dehydration.  Was given IV fluids, creatinine went down to 1.23 from 2.4.     Physical Exam   Triage Vital Signs: ED Triage Vitals  Encounter Vitals Group     BP      Systolic BP Percentile      Diastolic BP Percentile      Pulse      Resp      Temp      Temp src      SpO2      Weight      Height      Head Circumference      Peak Flow      Pain Score      Pain Loc      Pain Education      Exclude from Growth Chart     Most recent vital signs: Vitals:   09/21/23 1226 09/21/23 1230  BP:  (!) 108/94  Pulse:  88  Resp:  (!) 24  Temp: 98.1 F (36.7 C)   SpO2:  97%     General: Awake, no distress.  CV:  Good peripheral perfusion.  Resp:  Normal effort.  Clear Abd:  Soft, distended, nontender Other:  Bilateral lower extremity edema that appears symmetrical.   ED Results / Procedures / Treatments   Labs (all labs ordered are listed, but only abnormal results are displayed) Labs Reviewed  COMPREHENSIVE METABOLIC PANEL  WITH GFR - Abnormal; Notable for the following components:      Result Value   CO2 18 (*)    Glucose, Bld 143 (*)    BUN 28 (*)    Creatinine, Ser 1.77 (*)    Calcium  8.1 (*)    Total Protein 6.1 (*)    Albumin 2.7 (*)    GFR, Estimated 29 (*)    All other components within normal limits  BRAIN NATRIURETIC PEPTIDE - Abnormal; Notable for the following components:   B Natriuretic Peptide 187.9 (*)    All other components within normal limits  CBC WITH DIFFERENTIAL/PLATELET - Abnormal; Notable for the following components:   WBC 12.9 (*)    Hemoglobin 11.3 (*)    Platelets 437 (*)    Neutro Abs 10.8 (*)    Abs Immature Granulocytes 0.08 (*)    All other components within normal limits  PROTIME-INR  URINALYSIS, W/ REFLEX TO CULTURE (INFECTION SUSPECTED)  TROPONIN I (HIGH SENSITIVITY)  TROPONIN I (HIGH SENSITIVITY)     EKG  EKG shows, sinus rhythm,  rate 88, normal QRS, prolonged QTc, no ischemic ST elevation, T wave flattening to lateral leads, mild T wave inversion to inferior leads, not significant change compared to prior  RADIOLOGY On my independent interpretation, ultrasound without obvious DVT   PROCEDURES:  Critical Care performed: No  Procedures   MEDICATIONS ORDERED IN ED: Medications  furosemide (LASIX) injection 20 mg (has no administration in time range)     IMPRESSION / MDM / ASSESSMENT AND PLAN / ED COURSE  I reviewed the triage vital signs and the nursing notes.                              Differential diagnosis includes, but is not limited to, renal failure, CHF, DVT, venous stasis, electrolyte derangements, atypical ACS, she has no cough or fever to suggest pneumonia viral illness, also considered PE.  Get labs, EKG, troponin, chest x-ray, ultrasound DVT.  Patient's presentation is most consistent with acute presentation with potential threat to life or bodily function.  Independent interpretation of labs and imaging below.  Given the abdominal  distention, lower extremity edema and elevated BNP with pleural effusion noted on x-ray, she will need to be admitted for further workup of new CHF, will give her a dose of Lasix here.  Consult to hospitalist was agreeable with this plan for admission and will evaluate the patient.  She is admitted.  The patient is on the cardiac monitor to evaluate for evidence of arrhythmia and/or significant heart rate changes.   Clinical Course as of 09/21/23 1357  Mon Sep 21, 2023  1342 DG Chest 1 View IMPRESSION: 1. Unchanged left pleural effusion. The right pleural effusion on CT is not well seen by radiograph. 2. Stable cardiomegaly and aortic tortuosity.   [TT]  1342 US  Venous Img Lower Bilateral No evidence of deep venous thrombosis in either lower extremity.  [TT]  1356 Independent review of labs, creatinine is mildly elevated compared to her discharge creatinine, BNP is mildly elevated, she is my leukocytosis but no cough, troponin is normal. [TT]    Clinical Course User Index [TT] Drenda Gentle, Richard Champion, MD     FINAL CLINICAL IMPRESSION(S) / ED DIAGNOSES   Final diagnoses:  Dyspnea, unspecified type  Lower extremity edema  Abdominal distension     Rx / DC Orders   ED Discharge Orders     None        Note:  This document was prepared using Dragon voice recognition software and may include unintentional dictation errors.    Shane Darling, MD 09/21/23 [phone]

## 2023-09-22 ENCOUNTER — Inpatient Hospital Stay

## 2023-09-22 ENCOUNTER — Inpatient Hospital Stay (HOSPITAL_COMMUNITY)
Admit: 2023-09-22 | Discharge: 2023-09-22 | Disposition: A | Discharge status: Home / Self Care | Attending: Family Medicine | Admitting: Family Medicine

## 2023-09-22 ENCOUNTER — Encounter

## 2023-09-22 DIAGNOSIS — K529 Noninfective gastroenteritis and colitis, unspecified: Secondary | ICD-10-CM | POA: Diagnosis not present

## 2023-09-22 DIAGNOSIS — J90 Pleural effusion, not elsewhere classified: Secondary | ICD-10-CM | POA: Diagnosis present

## 2023-09-22 DIAGNOSIS — E8809 Other disorders of plasma-protein metabolism, not elsewhere classified: Secondary | ICD-10-CM | POA: Diagnosis present

## 2023-09-22 DIAGNOSIS — K769 Liver disease, unspecified: Secondary | ICD-10-CM | POA: Diagnosis present

## 2023-09-22 DIAGNOSIS — N2581 Secondary hyperparathyroidism of renal origin: Secondary | ICD-10-CM | POA: Diagnosis present

## 2023-09-22 DIAGNOSIS — R188 Other ascites: Secondary | ICD-10-CM | POA: Diagnosis present

## 2023-09-22 DIAGNOSIS — R0600 Dyspnea, unspecified: Secondary | ICD-10-CM | POA: Diagnosis not present

## 2023-09-22 DIAGNOSIS — Z9012 Acquired absence of left breast and nipple: Secondary | ICD-10-CM | POA: Diagnosis not present

## 2023-09-22 DIAGNOSIS — I129 Hypertensive chronic kidney disease with stage 1 through stage 4 chronic kidney disease, or unspecified chronic kidney disease: Secondary | ICD-10-CM | POA: Diagnosis present

## 2023-09-22 DIAGNOSIS — Z87891 Personal history of nicotine dependence: Secondary | ICD-10-CM | POA: Diagnosis not present

## 2023-09-22 DIAGNOSIS — N179 Acute kidney failure, unspecified: Secondary | ICD-10-CM

## 2023-09-22 DIAGNOSIS — E041 Nontoxic single thyroid nodule: Secondary | ICD-10-CM | POA: Diagnosis present

## 2023-09-22 DIAGNOSIS — Z91013 Allergy to seafood: Secondary | ICD-10-CM | POA: Diagnosis not present

## 2023-09-22 DIAGNOSIS — K4090 Unilateral inguinal hernia, without obstruction or gangrene, not specified as recurrent: Secondary | ICD-10-CM | POA: Diagnosis present

## 2023-09-22 DIAGNOSIS — R600 Localized edema: Secondary | ICD-10-CM | POA: Diagnosis not present

## 2023-09-22 DIAGNOSIS — R140 Abdominal distension (gaseous): Secondary | ICD-10-CM | POA: Diagnosis present

## 2023-09-22 DIAGNOSIS — I5031 Acute diastolic (congestive) heart failure: Secondary | ICD-10-CM | POA: Diagnosis not present

## 2023-09-22 DIAGNOSIS — E782 Mixed hyperlipidemia: Secondary | ICD-10-CM | POA: Diagnosis present

## 2023-09-22 DIAGNOSIS — Z515 Encounter for palliative care: Secondary | ICD-10-CM | POA: Diagnosis not present

## 2023-09-22 DIAGNOSIS — E785 Hyperlipidemia, unspecified: Secondary | ICD-10-CM | POA: Diagnosis not present

## 2023-09-22 DIAGNOSIS — M898X9 Other specified disorders of bone, unspecified site: Secondary | ICD-10-CM | POA: Diagnosis not present

## 2023-09-22 DIAGNOSIS — Z8249 Family history of ischemic heart disease and other diseases of the circulatory system: Secondary | ICD-10-CM | POA: Diagnosis not present

## 2023-09-22 DIAGNOSIS — D631 Anemia in chronic kidney disease: Secondary | ICD-10-CM | POA: Diagnosis present

## 2023-09-22 DIAGNOSIS — Z7982 Long term (current) use of aspirin: Secondary | ICD-10-CM | POA: Diagnosis not present

## 2023-09-22 DIAGNOSIS — E8770 Fluid overload, unspecified: Secondary | ICD-10-CM | POA: Diagnosis present

## 2023-09-22 DIAGNOSIS — C7951 Secondary malignant neoplasm of bone: Secondary | ICD-10-CM | POA: Diagnosis present

## 2023-09-22 DIAGNOSIS — N1831 Chronic kidney disease, stage 3a: Secondary | ICD-10-CM | POA: Diagnosis present

## 2023-09-22 DIAGNOSIS — Z1152 Encounter for screening for COVID-19: Secondary | ICD-10-CM | POA: Diagnosis not present

## 2023-09-22 DIAGNOSIS — D509 Iron deficiency anemia, unspecified: Secondary | ICD-10-CM | POA: Diagnosis not present

## 2023-09-22 DIAGNOSIS — Y92239 Unspecified place in hospital as the place of occurrence of the external cause: Secondary | ICD-10-CM | POA: Diagnosis present

## 2023-09-22 DIAGNOSIS — E860 Dehydration: Secondary | ICD-10-CM | POA: Diagnosis present

## 2023-09-22 LAB — COMPREHENSIVE METABOLIC PANEL WITH GFR
ALT: 12 U/L (ref 0–44)
ALT: 13 U/L (ref 0–44)
AST: 18 U/L (ref 15–41)
AST: 20 U/L (ref 15–41)
Albumin: 2.3 g/dL — ABNORMAL LOW (ref 3.5–5.0)
Albumin: 2.5 g/dL — ABNORMAL LOW (ref 3.5–5.0)
Alkaline Phosphatase: 100 U/L (ref 38–126)
Alkaline Phosphatase: 112 U/L (ref 38–126)
Anion gap: 10 (ref 5–15)
Anion gap: 11 (ref 5–15)
BUN: 31 mg/dL — ABNORMAL HIGH (ref 8–23)
BUN: 34 mg/dL — ABNORMAL HIGH (ref 8–23)
CO2: 19 mmol/L — ABNORMAL LOW (ref 22–32)
CO2: 16 mmol/L — ABNORMAL LOW (ref 22–32)
Calcium: 7.9 mg/dL — ABNORMAL LOW (ref 8.9–10.3)
Calcium: 8 mg/dL — ABNORMAL LOW (ref 8.9–10.3)
Chloride: 107 mmol/L (ref 98–111)
Chloride: 107 mmol/L (ref 98–111)
Creatinine, Ser: 1.92 mg/dL — ABNORMAL HIGH (ref 0.44–1.00)
Creatinine, Ser: 2.35 mg/dL — ABNORMAL HIGH (ref 0.44–1.00)
GFR, Estimated: 27 mL/min — ABNORMAL LOW (ref >60)
GFR, Estimated: 21 mL/min — ABNORMAL LOW (ref >60)
Glucose, Bld: 95 mg/dL (ref 70–99)
Glucose, Bld: 104 mg/dL — ABNORMAL HIGH (ref 70–99)
Potassium: 3.8 mmol/L (ref 3.5–5.1)
Potassium: 3.7 mmol/L (ref 3.5–5.1)
Sodium: 136 mmol/L (ref 135–145)
Sodium: 134 mmol/L — ABNORMAL LOW (ref 135–145)
Total Bilirubin: 0.7 mg/dL (ref 0.0–1.2)
Total Bilirubin: 0.6 mg/dL (ref 0.0–1.2)
Total Protein: 5.1 g/dL — ABNORMAL LOW (ref 6.5–8.1)
Total Protein: 5.5 g/dL — ABNORMAL LOW (ref 6.5–8.1)

## 2023-09-22 LAB — CBC WITH DIFFERENTIAL/PLATELET
Abs Immature Granulocytes: 0.05 10*3/uL (ref 0.00–0.07)
Basophils Absolute: 0 10*3/uL (ref 0.0–0.1)
Basophils Relative: 0 %
Eosinophils Absolute: 0.2 10*3/uL (ref 0.0–0.5)
Eosinophils Relative: 2 %
HCT: 30.2 % — ABNORMAL LOW (ref 36.0–46.0)
Hemoglobin: 9.5 g/dL — ABNORMAL LOW (ref 12.0–15.0)
Immature Granulocytes: 1 %
Lymphocytes Relative: 11 %
Lymphs Abs: 1 10*3/uL (ref 0.7–4.0)
MCH: 28 pg (ref 26.0–34.0)
MCHC: 31.5 g/dL (ref 30.0–36.0)
MCV: 89.1 fL (ref 80.0–100.0)
Monocytes Absolute: 0.8 10*3/uL (ref 0.1–1.0)
Monocytes Relative: 8 %
Neutro Abs: 7.6 10*3/uL (ref 1.7–7.7)
Neutrophils Relative %: 78 %
Platelets: 357 10*3/uL (ref 150–400)
RBC: 3.39 MIL/uL — ABNORMAL LOW (ref 3.87–5.11)
RDW: 13.3 % (ref 11.5–15.5)
WBC: 9.7 10*3/uL (ref 4.0–10.5)
nRBC: 0 % (ref 0.0–0.2)

## 2023-09-22 LAB — MAGNESIUM: Magnesium: 2.1 mg/dL (ref 1.7–2.4)

## 2023-09-22 LAB — PHOSPHORUS: Phosphorus: 3.1 mg/dL (ref 2.5–4.6)

## 2023-09-22 NOTE — Progress Notes (Signed)
 My review of labs, imaging, notes and other tests is significant for creatinine rising on repeat CMP. Discontinued lasix. Obtain Nephrology consult in AM.

## 2023-09-22 NOTE — Progress Notes (Signed)
 PROGRESS NOTE    Felicia Robinson  ONG:[id number] DOB: 12-09-1945 DOA: 09/21/2023 PCP: Lemar Pyles, NP  Chief Complaint  Patient presents with   Shortness of Breath    Hospital Course:  Felicia Robinson is a 78 y.o. female with medical history significant of stage IIIa CKD, X-linked hypophosphatemic rickets follows with [facility], osteoporosis, anemia, B12 deficiency, grief reaction of sertraline , mixed hyperlipidemia, vitamin D  deficiency, history of breast cancer, hypertension. Patient was admitted to the hospital 5/30 through 6/1 with prerenal dehydration induced AKI secondary to nausea, vomiting, diarrhea.  While admitted she received 24 hours of IV fluids.  At time of discharge kidney function was gradually improving and patient had improved to her physiologic baseline. When patient returned home she noted increased swelling in her lower extremities and at PCP follow-up today she was found to be overtly volume overloaded.  It was recommended she return to the ER. Of note patient has recently diagnosed lytic lesions.  Workup is ongoing at this time.  Multiple myeloma panel has been sent and is still pending.  Patient has outpatient follow-up with oncology and scheduled PET scan tomorrow.   On arrival to the ED this admission patient is found to have 1+ pitting edema in her lower extremities, and abdominal distention.  She reports she is currently 13 pounds above her normal weight.  Labs revealed BNP 187, WBC 12.9, creatinine 1.7.  Chest x-ray with small left pleural effusion, mild cardiomegaly.  Lower extremity Dopplers are negative for DVT.  Patient is stable on room air but endorses abdominal discomfort due to the swelling.  She denies any chest pain.  She has had no fever.    Subjective: Patient reports minimal urination overnight.  She reports that she has the sensation that she needs to go but is unable to produce significant amounts of urine.  She did receive straight cath x 1 today  but only 100 cc was obtained despite 250 cc seen on bladder scan. She believes her lower extremity swelling is much improved.  Objective: Vitals:   09/21/23 2354 09/22/23 0521 09/22/23 0715 09/22/23 1028  BP: 118/62 115/63 114/84 106/74  Pulse: 81 77 95 93  Resp: 18 18 18 19   Temp: 98.4 F (36.9 C) 98.4 F (36.9 C) 98.3 F (36.8 C) 98.2 F (36.8 C)  TempSrc:   Oral   SpO2: 94% 95% 95% 96%  Weight:      Height:        Intake/Output Summary (Last 24 hours) at 09/22/2023 1606 Last data filed at 09/22/2023 1600 Gross per 24 hour  Intake 600 ml  Output 200 ml  Net 400 ml   Filed Weights   09/21/23 1212  Weight: 58.7 kg    Examination: General exam: Appears calm and comfortable, NAD  Respiratory system: No work of breathing, symmetric chest wall expansion Cardiovascular system: S1 & S2 heard, RRR.  Gastrointestinal system: Abdomen is mildly distended, nontender to palpation Neuro: Alert and oriented. No focal neurological deficits. Extremities: Symmetric, expected ROM.  Pitting edema in lower extremities improved from prior exams Skin: No rashes, lesions Psychiatry: Demonstrates appropriate judgement and insight. Mood & affect appropriate for situation.   Assessment & Plan:  Principal Problem:   Volume overload Active Problems:   AKI (acute kidney injury) (HCC) Volume overload - Evidence of overload by lower extremity edema and abdominal swelling.  Some ascites seen on prior CT. - Patient recently admitted with AKI and received 24 hours of normal saline.  No  volume overload during that admission, but did return with lower extremity edema, bilateral pleural effusions, and increasing abdominal distention on this admission within 24 hours of discharge - No history of heart failure.  Very well-controlled hypertension. - Have ordered echocardiogram, still pending at this time - Small pleural effusion on CXR, stable since prior admission - Stable and comfortable on room air.  No  significant crackles on exam - BNP 187. -Lasix nave.  Continue with 20 mg twice daily IV for now - Strict I's and O's - Lower extremity edema is improving though patient has had minimal output today. - Kidney function unlikely to tolerate higher dose Lasix.  May require Lasix drip.  Interestingly BUN is rising -Consider cardiology consult per results of echocardiogram  AKI on CKD - Recently admitted with prerenal injury secondary to vomiting, nausea, anorexia.  Creatinine down trended to 1.23 on 6/1, up trended to 1.7 at admission.  Patient did receive CT contrast on 6/1 prior to discharge - Creatinine continues to rise, BUN also rising despite clinical evidence of volume overload. - Currently on 20 mg IV push Lasix.  Has had minimal output with this despite straight cath x 1. - Continue bladder scanning -Repeat CMP ordered and pending - Have ordered for retroperitoneal ultrasound to evaluate for hydronephrosis or urinary obstruction - If kidney function not improving we will pursue nephrology consult.  Abdominal distention - Initially thought to be secondary to volume overload, however she is not diuresing as expected.  Now have concerns of ascites - Complete abdominal ultrasound ordered to evaluate for ascites burden today (CT >1 wk ago) - Will order paracentesis with IR and send sample for cyto-path  Diffuse lytic lesions - Presumed malignancy,  unable to find primary source at this time.  Have reviewed scans again with radiology today. Lytic lesions difficult to truly characterize in setting of profound osteopenia. We will pursue paracentesis to obtain sample of ascites which is otherwise not explained by heart failure and normal liver function.  Cell lines mostly stable, do not suspect the bone marrow biopsy would be immediately helpful.  Fluid density lesion of the right gluteal musculature appears to have simple fluid collection, not likely malignant per radiology review. -CT abdomen  pelvis 5/25: Chronic 6 mm hepatic lobe lesion, stable in size.  3 x 2.5 cm well-circumscribed fluid density lesion above the right gluteal musculature, small volume ascites containing umbilical hernia, small fat-containing right inguinal hernia.  Diffusely heterogenous axial and appendicular skeleton with multiple underlying lytic lesions, redemonstration of a chronic S3 sacral foramina fluid density lesion expanding diaphragm and is suggestive of a Tarlov cyst. - Patient has established follow-up with Dr. [facility]. - Multiple myeloma panel sent, on review does not appear diagnostic for multiple myeloma but pending oncology interpretation - Peripheral smear review was unremarkable WBC morphology - Chest CT without primary lesion.  No significant adenopathy - Had PET scan scheduled for today but nuclear medicine team would not allow to occur while inpatient. We will try again to pursue this.   Nausea, vomiting - Resolved at this time.  Has been chronic and intermittent for the last 3 weeks - Workup on prior admission reveals COVID/RSV/flu negative.  TSH and hemoglobin A1c unremarkable - She has been tolerating her diet for now - Will continue with as needed Zofran    Chronic diarrhea - Patient endorses intermittent diarrhea for the last many months - GI PCR negative - C. difficile ordered and still pending - Would benefit from outpatient  GI follow-up.  Leukocytosis - WBC 12.9 on arrival.  No infectious signs or symptoms - Was previously elevated on prior admission and downtrended without intervention.  May be reactive vs malignancy associated -Continue to trend CBC. - Urine culture 5/25 with 30,000 colonies of lactobacillus.  Patient completed course of Keflex.  Denies any LUTS - Chest x-ray without infiltrates   Thrombocytosis - Platelets 437 on arrival, 472 on 5/30 which downtrended to normal without intervention. - Leukocytosis and thrombocytosis likely in some part related to  underlying malignancy/multiple myeloma.  Workup as above.   History of breast cancer, 2013.  In remission since 2018. - Status post left mastectomy -- Has had annual mammograms without recurrence. Most recent 10/2022 WNL   X-linked hypophosphatemia rickets - Follows with [facility] endocrinology - History of bilateral femoral stress fractures 2019, tibial fracture 2021 - Receives Cryvista injections monthly.  Scheduled for next dose this week. Holding for now.   B12 deficiency - Continue outpatient supplements at discharge   History of iron deficiency anemia - Continue with iron and B12 supplementation at discharge - Hemoglobin appears near baseline at this time - Trend CBC   Grief reaction - Continue sertraline  50mg  dose    Mixed hyperlipidemia - Continue statin   Hypertension - Pressure currently at goal without antihypertensives.  Will monitor.  Resume home meds as needed    Hypoalbuminemia Hypocalcemia - Correct electrolytes as needed - Trend daily CMP   Thyroid nodule - 1.7 cm right thyroid nodule seen on chest CT.  Outpatient follow-up for thyroid ultrasound - TSH within normal limits    DVT prophylaxis: Heparin    Code Status: Full Code Disposition:  Inpatient, work up on going.  Consultants:    Procedures:    Antimicrobials:  Anti-infectives (From admission, onward)    None       Data Reviewed: I have personally reviewed following labs and imaging studies CBC: Recent Labs  Lab 09/18/23 1230 09/19/23 0700 09/20/23 0642 09/21/23 1229 09/21/23 1526 09/22/23 0506  WBC 12.6* 8.6 9.1 12.9* 14.0* 9.7  NEUTROABS 10.3* 6.8 7.2 10.8*  --  7.6  HGB 12.1 10.3* 10.2* 11.3* 11.4* 9.5*  HCT 37.0 31.3* 32.0* 36.0 36.5 30.2*  MCV 88.1 88.4 88.2 90.7 90.6 89.1  PLT 472* 323 388 437* 466* 357   Basic Metabolic Panel: Recent Labs  Lab 09/18/23 1230 09/19/23 0700 09/20/23 0642 09/21/23 1229 09/21/23 1526 09/22/23 0506  NA 130* 135 135 135  --  136  K 3.3*  3.7 3.5 3.8  --  3.8  CL 100 108 109 106  --  107  CO2 19* 19* 19* 18*  --  19*  GLUCOSE 152* 96 121* 143*  --  95  BUN 46* 37* 27* 28*  --  31*  CREATININE 2.42* 1.74* 1.23* 1.77* 1.80* 1.92*  CALCIUM  8.1* 7.8* 7.9* 8.1*  --  7.9*  MG  --  1.5* 2.5*  --   --  2.1  PHOS  --  2.3* 3.1  --   --  3.1   GFR: Estimated Creatinine Clearance: 16.5 mL/min (A) (by C-G formula based on SCr of 1.92 mg/dL (H)). Liver Function Tests: Recent Labs  Lab 09/18/23 1230 09/19/23 0700 09/20/23 0642 09/21/23 1229 09/22/23 0506  AST 21 17 18 23 18   ALT 15 11 13 13 12   ALKPHOS 152* 101 107 120 100  BILITOT 0.8 0.9 0.5 0.5 0.7  PROT 7.0 5.2* 5.4* 6.1* 5.1*  ALBUMIN 3.2* 2.3* 2.4* 2.7* 2.3*  CBG: No results for input(s): "GLUCAP" in the last 168 hours.  Recent Results (from the past 240 hours)  Urine Culture     Status: Abnormal   Collection Time: 09/13/23  5:50 PM   Specimen: Urine, Clean Catch  Result Value Ref Range Status   Specimen Description   Final    URINE, CLEAN CATCH Performed at [facility], [address] [place], [place] [address]    Special Requests   Final    NONE Performed at [facility], [address] [place], [place] [address]    Culture (A)  Final    30,000 COLONIES/mL LACTOBACILLUS SPECIES Standardized susceptibility testing for this organism is not available. Performed at [facility], [address] [place], [place] [address]    Report Status 09/14/2023 FINAL  Final  Resp panel by RT-PCR (RSV, Flu A&B, Covid) Anterior Nasal Swab     Status: None   Collection Time: 09/18/23  8:02 PM   Specimen: Anterior Nasal Swab  Result Value Ref Range Status   SARS Coronavirus 2 by RT PCR NEGATIVE NEGATIVE Final    Comment: (NOTE) SARS-CoV-2 target nucleic acids are NOT DETECTED.  The SARS-CoV-2 RNA is generally detectable in upper respiratory specimens during the acute phase of infection. The lowest concentration of SARS-CoV-2 viral copies  this assay can detect is 138 copies/mL. A negative result does not preclude SARS-Cov-2 infection and should not be used as the sole basis for treatment or other patient management decisions. A negative result may occur with  improper specimen collection/handling, submission of specimen other than nasopharyngeal swab, presence of viral mutation(s) within the areas targeted by this assay, and inadequate number of viral copies(<138 copies/mL). A negative result must be combined with clinical observations, patient history, and epidemiological information. The expected result is Negative.  Fact Sheet for Patients:  BloggerCourse.com  Fact Sheet for Healthcare Providers:  SeriousBroker.it  This test is no t yet approved or cleared by the [place]  FDA and  has been authorized for detection and/or diagnosis of SARS-CoV-2 by FDA under an Emergency Use Authorization (EUA). This EUA will remain  in effect (meaning this test can be used) for the duration of the COVID-19 declaration under Section 564(b)(1) of the Act, 21 U.S.C.section 360bbb-3(b)(1), unless the authorization is terminated  or revoked sooner.       Influenza A by PCR NEGATIVE NEGATIVE Final   Influenza B by PCR NEGATIVE NEGATIVE Final    Comment: (NOTE) The Xpert Xpress SARS-CoV-2/FLU/RSV plus assay is intended as an aid in the diagnosis of influenza from Nasopharyngeal swab specimens and should not be used as a sole basis for treatment. Nasal washings and aspirates are unacceptable for Xpert Xpress SARS-CoV-2/FLU/RSV testing.  Fact Sheet for Patients: BloggerCourse.com  Fact Sheet for Healthcare Providers: SeriousBroker.it  This test is not yet approved or cleared by the [place]  FDA and has been authorized for detection and/or diagnosis of SARS-CoV-2 by FDA under an Emergency Use Authorization (EUA). This EUA  will remain in effect (meaning this test can be used) for the duration of the COVID-19 declaration under Section 564(b)(1) of the Act, 21 U.S.C. section 360bbb-3(b)(1), unless the authorization is terminated or revoked.     Resp Syncytial Virus by PCR NEGATIVE NEGATIVE Final    Comment: (NOTE) Fact Sheet for Patients: BloggerCourse.com  Fact Sheet for Healthcare Providers: SeriousBroker.it  This test is not yet approved or cleared by the [place]  FDA and has been authorized for detection  and/or diagnosis of SARS-CoV-2 by FDA under an Emergency Use Authorization (EUA). This EUA will remain in effect (meaning this test can be used) for the duration of the COVID-19 declaration under Section 564(b)(1) of the Act, 21 U.S.C. section 360bbb-3(b)(1), unless the authorization is terminated or revoked.  Performed at [facility], [address] [place], [place] [address]   Gastrointestinal Panel by PCR , Stool     Status: None   Collection Time: 09/20/23  4:34 AM   Specimen: Stool  Result Value Ref Range Status   Campylobacter species NOT DETECTED NOT DETECTED Final   Plesimonas shigelloides NOT DETECTED NOT DETECTED Final   Salmonella species NOT DETECTED NOT DETECTED Final   Yersinia enterocolitica NOT DETECTED NOT DETECTED Final   Vibrio species NOT DETECTED NOT DETECTED Final   Vibrio cholerae NOT DETECTED NOT DETECTED Final   Enteroaggregative E coli (EAEC) NOT DETECTED NOT DETECTED Final   Enteropathogenic E coli (EPEC) NOT DETECTED NOT DETECTED Final   Enterotoxigenic E coli (ETEC) NOT DETECTED NOT DETECTED Final   Shiga like toxin producing E coli (STEC) NOT DETECTED NOT DETECTED Final   Shigella/Enteroinvasive E coli (EIEC) NOT DETECTED NOT DETECTED Final   Cryptosporidium NOT DETECTED NOT DETECTED Final   Cyclospora cayetanensis NOT DETECTED NOT DETECTED Final   Entamoeba histolytica NOT DETECTED NOT DETECTED  Final   Giardia lamblia NOT DETECTED NOT DETECTED Final   Adenovirus F40/41 NOT DETECTED NOT DETECTED Final   Astrovirus NOT DETECTED NOT DETECTED Final   Norovirus GI/GII NOT DETECTED NOT DETECTED Final   Rotavirus A NOT DETECTED NOT DETECTED Final   Sapovirus (I, II, IV, and V) NOT DETECTED NOT DETECTED Final    Comment: Performed at [facility], [address] [place], [place] [address]     Radiology Studies: US  Venous Img Lower Bilateral Result Date: 09/21/2023 CLINICAL DATA:  Bilateral lower extremity swelling EXAM: BILATERAL LOWER EXTREMITY VENOUS DOPPLER ULTRASOUND TECHNIQUE: Gray-scale sonography with graded compression, as well as color Doppler and duplex ultrasound were performed to evaluate the lower extremity deep venous systems from the level of the common femoral vein and including the common femoral, femoral, profunda femoral, popliteal and calf veins including the posterior tibial, peroneal and gastrocnemius veins when visible. The superficial great saphenous vein was also interrogated. Spectral Doppler was utilized to evaluate flow at rest and with distal augmentation maneuvers in the common femoral, femoral and popliteal veins. COMPARISON:  None Available. FINDINGS: RIGHT LOWER EXTREMITY Common Femoral Vein: No evidence of thrombus. Normal compressibility, respiratory phasicity and response to augmentation. Saphenofemoral Junction: No evidence of thrombus. Normal compressibility and flow on color Doppler imaging. Profunda Femoral Vein: No evidence of thrombus. Normal compressibility and flow on color Doppler imaging. Femoral Vein: No evidence of thrombus. Normal compressibility, respiratory phasicity and response to augmentation. Popliteal Vein: No evidence of thrombus. Normal compressibility, respiratory phasicity and response to augmentation. Calf Veins: No evidence of thrombus. Normal compressibility and flow on color Doppler imaging. Superficial Great Saphenous Vein: No  evidence of thrombus. Normal compressibility. Venous Reflux:  None. Other Findings:  None. LEFT LOWER EXTREMITY Common Femoral Vein: No evidence of thrombus. Normal compressibility, respiratory phasicity and response to augmentation. Saphenofemoral Junction: No evidence of thrombus. Normal compressibility and flow on color Doppler imaging. Profunda Femoral Vein: No evidence of thrombus. Normal compressibility and flow on color Doppler imaging. Femoral Vein: No evidence of thrombus. Normal compressibility, respiratory phasicity and response to augmentation. Popliteal Vein: No evidence of thrombus. Normal compressibility, respiratory  phasicity and response to augmentation. Calf Veins: No evidence of thrombus. Normal compressibility and flow on color Doppler imaging. Superficial Great Saphenous Vein: No evidence of thrombus. Normal compressibility. Venous Reflux:  None. Other Findings:  None. IMPRESSION: No evidence of deep venous thrombosis in either lower extremity. Electronically Signed   By: Fernando Hoyer M.D.   On: 09/21/2023 13:24   DG Chest 1 View Result Date: 09/21/2023 CLINICAL DATA:  Shortness of breath and chest pain. EXAM: CHEST  1 VIEW COMPARISON:  Chest CT yesterday FINDINGS: Stable cardiomegaly. Aortic tortuosity. Left pleural effusion is unchanged allowing for differences in modality. The right pleural effusion on CT is not well seen by radiograph. No new airspace disease. No pulmonary edema. No pneumothorax. Scoliotic curvature of spine. IMPRESSION: 1. Unchanged left pleural effusion. The right pleural effusion on CT is not well seen by radiograph. 2. Stable cardiomegaly and aortic tortuosity. Electronically Signed   By: Chadwick Colonel M.D.   On: 09/21/2023 13:22    Scheduled Meds:  aspirin  81 mg Oral Daily   ferrous sulfate  325 mg Oral Q breakfast   furosemide  20 mg Intravenous Q12H   heparin   5,000 Units Subcutaneous Q8H   rosuvastatin   10 mg Oral Daily   sertraline   75 mg Oral  Daily   Continuous Infusions:   LOS: 0 days  MDM: Patient is high risk for one or more organ failure.  They necessitate ongoing hospitalization for continued IV therapies and subsequent lab monitoring. Total time spent interpreting labs and vitals, reviewing the medical record, coordinating care amongst consultants and care team members, directly assessing and discussing care with the patient and/or family: 55 min  Anaija Wissink, DO [facility]  To contact the attending physician between 7A-7P please use Epic Chat. To contact the covering physician during after hours 7P-7A, please review Amion.  09/22/2023, 4:06 PM   *This document has been created with the assistance of dictation software. Please excuse typographical errors. *

## 2023-09-22 NOTE — Care Management Obs Status (Signed)
 MEDICARE OBSERVATION STATUS NOTIFICATION   Patient Details  Name: Felicia Robinson MRN: [id number] Date of Birth: 04-18-1946   Medicare Observation Status Notification Given:  Yes    Anise Kerns 09/22/2023, 12:19 PM

## 2023-09-22 NOTE — Plan of Care (Signed)
   Problem: Education: Goal: Knowledge of General Education information will improve Description Including pain rating scale, medication(s)/side effects and non-pharmacologic comfort measures Outcome: Progressing

## 2023-09-23 ENCOUNTER — Inpatient Hospital Stay

## 2023-09-23 DIAGNOSIS — N179 Acute kidney failure, unspecified: Secondary | ICD-10-CM | POA: Diagnosis not present

## 2023-09-23 DIAGNOSIS — R600 Localized edema: Secondary | ICD-10-CM | POA: Diagnosis not present

## 2023-09-23 DIAGNOSIS — R140 Abdominal distension (gaseous): Secondary | ICD-10-CM | POA: Diagnosis not present

## 2023-09-23 DIAGNOSIS — K529 Noninfective gastroenteritis and colitis, unspecified: Secondary | ICD-10-CM

## 2023-09-23 DIAGNOSIS — E8770 Fluid overload, unspecified: Secondary | ICD-10-CM | POA: Diagnosis not present

## 2023-09-23 DIAGNOSIS — M898X9 Other specified disorders of bone, unspecified site: Secondary | ICD-10-CM | POA: Diagnosis not present

## 2023-09-23 DIAGNOSIS — Z515 Encounter for palliative care: Secondary | ICD-10-CM

## 2023-09-23 LAB — BODY FLUID CELL COUNT WITH DIFFERENTIAL
Eos, Fluid: 0 %
Lymphs, Fluid: 13 %
Monocyte-Macrophage-Serous Fluid: 21 %
Neutrophil Count, Fluid: 66 %
Total Nucleated Cell Count, Fluid: 1819 uL

## 2023-09-23 LAB — CBC WITH DIFFERENTIAL/PLATELET
Abs Immature Granulocytes: 0.06 10*3/uL (ref 0.00–0.07)
Basophils Absolute: 0 10*3/uL (ref 0.0–0.1)
Basophils Relative: 0 %
Eosinophils Absolute: 0.1 10*3/uL (ref 0.0–0.5)
Eosinophils Relative: 1 %
HCT: 33.3 % — ABNORMAL LOW (ref 36.0–46.0)
Hemoglobin: 10.6 g/dL — ABNORMAL LOW (ref 12.0–15.0)
Immature Granulocytes: 1 %
Lymphocytes Relative: 10 %
Lymphs Abs: 1.1 10*3/uL (ref 0.7–4.0)
MCH: 28.5 pg (ref 26.0–34.0)
MCHC: 31.8 g/dL (ref 30.0–36.0)
MCV: 89.5 fL (ref 80.0–100.0)
Monocytes Absolute: 0.9 10*3/uL (ref 0.1–1.0)
Monocytes Relative: 8 %
Neutro Abs: 8.6 10*3/uL — ABNORMAL HIGH (ref 1.7–7.7)
Neutrophils Relative %: 80 %
Platelets: 369 10*3/uL (ref 150–400)
RBC: 3.72 MIL/uL — ABNORMAL LOW (ref 3.87–5.11)
RDW: 13.5 % (ref 11.5–15.5)
WBC: 10.9 10*3/uL — ABNORMAL HIGH (ref 4.0–10.5)
nRBC: 0 % (ref 0.0–0.2)

## 2023-09-23 LAB — URINALYSIS, W/ REFLEX TO CULTURE (INFECTION SUSPECTED)
Bilirubin Urine: NEGATIVE
Glucose, UA: NEGATIVE mg/dL
Ketones, ur: NEGATIVE mg/dL
Nitrite: NEGATIVE
Protein, ur: NEGATIVE mg/dL
Specific Gravity, Urine: 1.015 (ref 1.005–1.030)
pH: 5 (ref 5.0–8.0)

## 2023-09-23 LAB — URINALYSIS, COMPLETE (UACMP) WITH MICROSCOPIC
Bilirubin Urine: NEGATIVE
Glucose, UA: NEGATIVE mg/dL
Ketones, ur: NEGATIVE mg/dL
Nitrite: NEGATIVE
Protein, ur: NEGATIVE mg/dL
Specific Gravity, Urine: 1.015 (ref 1.005–1.030)
pH: 5 (ref 5.0–8.0)

## 2023-09-23 LAB — ECHOCARDIOGRAM COMPLETE
AV Mean grad: 6.5 mmHg
AV Peak grad: 12.5 mmHg
Ao pk vel: 1.76 m/s
Area-P 1/2: 4.4 cm2
Height: 54 in
S' Lateral: 2.4 cm
Weight: 2070.38 [oz_av]

## 2023-09-23 LAB — PROTEIN, PLEURAL OR PERITONEAL FLUID: Total protein, fluid: 4.2 g/dL

## 2023-09-23 LAB — COMPREHENSIVE METABOLIC PANEL WITH GFR
ALT: 13 U/L (ref 0–44)
AST: 21 U/L (ref 15–41)
Albumin: 2.5 g/dL — ABNORMAL LOW (ref 3.5–5.0)
Alkaline Phosphatase: 116 U/L (ref 38–126)
Anion gap: 11 (ref 5–15)
BUN: 36 mg/dL — ABNORMAL HIGH (ref 8–23)
CO2: 17 mmol/L — ABNORMAL LOW (ref 22–32)
Calcium: 8.1 mg/dL — ABNORMAL LOW (ref 8.9–10.3)
Chloride: 106 mmol/L (ref 98–111)
Creatinine, Ser: 2.61 mg/dL — ABNORMAL HIGH (ref 0.44–1.00)
GFR, Estimated: 18 mL/min — ABNORMAL LOW (ref >60)
Glucose, Bld: 106 mg/dL — ABNORMAL HIGH (ref 70–99)
Potassium: 3.7 mmol/L (ref 3.5–5.1)
Sodium: 134 mmol/L — ABNORMAL LOW (ref 135–145)
Total Bilirubin: 0.7 mg/dL (ref 0.0–1.2)
Total Protein: 5.6 g/dL — ABNORMAL LOW (ref 6.5–8.1)

## 2023-09-23 LAB — PROTEIN / CREATININE RATIO, URINE
Creatinine, Urine: 145 mg/dL
Protein Creatinine Ratio: 0.11 mg/mg{creat} (ref 0.00–0.15)
Total Protein, Urine: 16 mg/dL

## 2023-09-23 LAB — LACTATE DEHYDROGENASE, PLEURAL OR PERITONEAL FLUID: LD, Fluid: 211 U/L — ABNORMAL HIGH (ref 3–23)

## 2023-09-23 LAB — ALBUMIN, PLEURAL OR PERITONEAL FLUID: Albumin, Fluid: 2.1 g/dL

## 2023-09-23 LAB — AMYLASE, PLEURAL OR PERITONEAL FLUID: Amylase, Fluid: 30 U/L

## 2023-09-23 LAB — GLUCOSE, PLEURAL OR PERITONEAL FLUID: Glucose, Fluid: 97 mg/dL

## 2023-09-23 LAB — MAGNESIUM: Magnesium: 2 mg/dL (ref 1.7–2.4)

## 2023-09-23 LAB — PHOSPHORUS: Phosphorus: 3.2 mg/dL (ref 2.5–4.6)

## 2023-09-23 MED ORDER — IPRATROPIUM-ALBUTEROL 0.5-2.5 (3) MG/3ML IN SOLN: 3.0000 mL | Freq: Four times a day (QID) | RESPIRATORY_TRACT | Status: DC | PRN

## 2023-09-23 MED ORDER — ENSURE PLUS HIGH PROTEIN PO LIQD
237.0000 mL | Freq: Two times a day (BID) | ORAL | Status: DC
  Administered 2023-09-23 – 2023-09-25 (×2): 237 mL via ORAL

## 2023-09-23 MED ORDER — IPRATROPIUM-ALBUTEROL 0.5-2.5 (3) MG/3ML IN SOLN: 3.0000 mL | Freq: Four times a day (QID) | RESPIRATORY_TRACT | Status: DC

## 2023-09-23 MED ORDER — LIDOCAINE HCL (PF) 1 % IJ SOLN
6.0000 mL | Freq: Once | INTRAMUSCULAR | Status: AC
  Administered 2023-09-23: 6 mL via INTRADERMAL

## 2023-09-23 NOTE — Plan of Care (Signed)

## 2023-09-23 NOTE — Procedures (Signed)
 PROCEDURE SUMMARY:  Successful US  guided paracentesis from right lateral abdomen.  Yielded 3.5 liters of clear yellow fluid.  No immediate complications.  Patient tolerated well.  EBL = trace  Specimen sent for labs.  Adyn Serna Alonzo Artis PA-C 09/23/2023 10:37 AM

## 2023-09-23 NOTE — Plan of Care (Signed)
   Problem: Education: Goal: Knowledge of General Education information will improve Description Including pain rating scale, medication(s)/side effects and non-pharmacologic comfort measures Outcome: Progressing

## 2023-09-23 NOTE — Consult Note (Signed)
 Consultation Note Date: 09/23/2023 at 1430  Patient Name: Felicia Robinson  DOB: July 24, 1945  MRN: [id number]  Age / Sex: 78 y.o., female  PCP: Lemar Pyles, NP Referring Physician: Brenna Cam, MD  HPI/Patient Profile: 78 y.o. female  with past medical history of stage IIIa CKD, X-linked hypophosphatemic rickets follows with [facility], osteoporosis, anemia, B12 deficiency, grief reaction of sertraline , mixed hyperlipidemia, vitamin D  deficiency, history of breast cancer, hypertension, recent hospitalization (5/30 - 6/1) with prerenal dehydration induced AKI secondary to nausea/vomiting/diarrhea.  She was admitted for 24 hours, given IV fluids, and discharged with kidney function gradually improving.  Patient followed up with her PCP after discharge due to increased swelling in her lower extremities.  Patient was overtly volume overloaded and advised to return to the ED.  Of note, patient was recently diagnosed with Lydick lesions, multiple myeloma panel has been sent and is pending, and had plans to follow-up outpatient with oncology with scheduled PET scan.  Patient admitted on 09/21/2023 with +1 pitting edema in her lower extremities and abdominal distention.  6/4-patient had paracentesis with 3.5 L removed.  PMT was consulted to support patient with goals of care discussions.   Clinical Assessment and Goals of Care: Extensive chart review completed prior to meeting patient including labs, vital signs, imaging, progress notes, orders, and available advanced directive documents from current and previous encounters. I then met with patient at bedside to discuss diagnosis prognosis, GOC, EOL wishes, disposition and options.  I introduced Palliative Medicine as specialized medical care for people living with serious illness. It focuses on providing relief from the symptoms and stress of a serious illness. The goal  is to improve quality of life for both the patient and the family.  We discussed a brief life review of the patient.  She is a widowed, retired Geophysicist/field seismologist.  She has 1 child who spends his time between [place] and [place].  She shares he lives with her when business brings him to [place] and takes care of her affairs.  As far as functional and nutritional status patient endorses she was having difficulty eating without feeling nauseous and difficulty ambulating due to swelling PTA. She shares she felt "9 months pregnant" with how large her stomach was.  She says she did "my best to do what the doctor said" but that long became so extreme that she knew she needed to go to the ER.  Our visit comes after she has just had 3.5 L of fluid removed via paracentesis.  She endorses instant relief of abdominal tightness.  She has taken a few sips of soup and plans to have more soft food later today.  She is grateful that she can swallow without feeling sick now.  We discussed patient's current illness and that etiology is unknown at this time.  Multiple myeloma panel is pending, patient will need to receive PET scan as an outpatient, and various lab test continue to be run to help determine what disease process is occurring.  We discussed  potential that it is cancer.  She shares she would like to get all the test and information compiled and then be able to follow-up with her doctors at [facility].  She shares that she has a long history with them and trust they would do what is best for her moving forward.  I attempted to elicit values and goals of care important to the patient. Patient shares she wants to "find out what it is" and hopefully be able to treat it.  She shares she is hopeful she can treated and still feel well.  Discussed quality versus quantity of life.      Advance directives, concepts specific to code status, artificial feeding and hydration, and rehospitalization were considered and  discussed.  Patient confirmed that in the event she is unable to speak for herself that her son would be her next of kin and surrogate decision maker.  Patient shares she has considered her options in the past but never felt like there was any urgency to make a final decision.  She shares that each circumstance that arises in her medical treatment would help determine what boundaries she would like to put in place.  Specifically, patient would like to know a diagnosis and prognosis prior to making any change to boundaries of care.    Full code and full scope remain.  However, patient did state that she would never want to "live in limbo" or to be "kept alive just to be kept alive".  I suspect patient would never be accepting of tracheostomy or long-term ventilatory support.  However, ongoing conversations to continue with more information about her diagnosis is known.  Primary Decision Maker PATIENT  Physical Exam Vitals reviewed.  HENT:     Head: Normocephalic.  Eyes:     Pupils: Pupils are equal, round, and reactive to light.  Cardiovascular:     Rate and Rhythm: Normal rate.  Pulmonary:     Effort: Pulmonary effort is normal.  Abdominal:     Palpations: Abdomen is soft. There is no mass.  Musculoskeletal:        General: Normal range of motion.  Neurological:     Mental Status: She is alert and oriented to person, place, and time.  Psychiatric:        Mood and Affect: Mood normal. Mood is not anxious.        Behavior: Behavior normal. Behavior is not agitated.     Palliative Assessment/Data: 60-70%     Thank you for this consult. Palliative medicine will continue to follow and assist holistically.   Time Total: 75 minutes  Time spent includes: Detailed review of medical records (labs, imaging, vital signs), medically appropriate exam (mental status, respiratory, cardiac, skin), discussed with treatment team, counseling and educating patient, family and staff, documenting  clinical information, medication management and coordination of care.  Signed by: Eller Gut, DNP, FNP-BC Palliative Medicine   Please contact Palliative Medicine Team providers via [facility] for questions and concerns.

## 2023-09-23 NOTE — Progress Notes (Signed)
 PROGRESS NOTE    [name]  UJW:[id number] DOB: 11/02/1945 DOA: 09/21/2023 PCP: Lemar Pyles, NP  Chief Complaint  Patient presents with   Shortness of Breath    Hospital Course:  Felicia Robinson is a 78 y.o. female with medical history significant of stage IIIa CKD, X-linked hypophosphatemic rickets follows with [facility], osteoporosis, anemia, B12 deficiency, grief reaction of sertraline , mixed hyperlipidemia, vitamin D  deficiency, history of breast cancer, hypertension. Patient was admitted to the hospital 5/30 through 6/1 with prerenal dehydration induced AKI secondary to nausea, vomiting, diarrhea.  While admitted she received 24 hours of IV fluids.  At time of discharge kidney function was gradually improving and patient had improved to her physiologic baseline. When patient returned home she noted increased swelling in her lower extremities and at PCP follow-up today she was found to be overtly volume overloaded.  It was recommended she return to the ER. Of note patient has recently diagnosed lytic lesions.  Workup is ongoing at this time.  Multiple myeloma panel has been sent and is still pending.  Patient has outpatient follow-up with oncology and scheduled PET scan tomorrow.   On arrival to the ED this admission patient is found to have 1+ pitting edema in her lower extremities, and abdominal distention.  She reports she is currently 13 pounds above her normal weight.  Labs revealed BNP 187, WBC 12.9, creatinine 1.7.  Chest x-ray with small left pleural effusion, mild cardiomegaly.  Lower extremity Dopplers are negative for DVT.  Patient is stable on room air but endorses abdominal discomfort due to the swelling.  She denies any chest pain.  She has had no fever.    6/4: Palliative care and nephrology consult.  Ultrasound-guided paracentesis with 3.5 L of fluid removal  Subjective: Patient reports minimal urination overnight.  Her abdomen does feel tight and she is hopeful to  get fluid removed today  Objective: Vitals:   09/23/23 0320 09/23/23 0823 09/23/23 1012 09/23/23 1159  BP: 121/69 113/81 111/75 112/66  Pulse: 88  91 82  Resp: 20     Temp: (!) 97.5 F (36.4 C) 98.2 F (36.8 C)    TempSrc: Oral Oral    SpO2: 96% 95% 95% 95%  Weight:      Height:        Intake/Output Summary (Last 24 hours) at 09/23/2023 2207 Last data filed at 09/23/2023 1830 Gross per 24 hour  Intake 600 ml  Output 798 ml  Net -198 ml   Filed Weights   09/21/23 1212  Weight: 58.7 kg    Examination: General exam: Appears calm and comfortable, NAD  Respiratory system: No work of breathing, symmetric chest wall expansion Cardiovascular system: S1 & S2 heard, RRR.  Gastrointestinal system: Abdomen is distended, nontender to palpation Neuro: Alert and oriented. No focal neurological deficits. Extremities: Symmetric, expected ROM.  Pitting edema in lower extremities improved from prior exams Skin: No rashes, lesions Psychiatry: Demonstrates appropriate judgement and insight. Mood & affect appropriate for situation.   Assessment & Plan:  Principal Problem:   Volume overload Active Problems:   AKI (acute kidney injury) (HCC) Volume overload on admission - Evidence of overload by lower extremity edema and abdominal swelling. Ascites seen on prior CT. - Patient recently admitted with AKI and received 24 hours of normal saline.  No volume overload during that admission, but did return with lower extremity edema, bilateral pleural effusions, and increasing abdominal distention on this admission within 24 hours of discharge -  No history of heart failure.  Very well-controlled hypertension. - echo shows normal function - Small pleural effusion on CXR, stable since prior admission - Stable and comfortable on room air.  No significant crackles on exam - BNP 187. -Lasix nave.  Hold lasix with worsening renal function - Strict I's and O's - Lower extremity edema is improving though  patient has had minimal output in last 24 hrs likely due to intravascular volume depletion  AKI on CKD 3a - Recently admitted with prerenal injury secondary to vomiting, nausea, anorexia.  Creatinine down trended to 1.23 on 6/1, up trended to 1.7 at admission.  Patient did receive CT contrast on 6/1 prior to discharge - Creatinine continues to rise, BUN & creat likely due to contrast exposure and Diuresis - Hold Diuresis - Nephro c/s  Ascites - s/p ultrasound guided paracentesis with removal of 2.5 L of fluid - Labs pending  Diffuse lytic lesions - Presumed malignancy,  unable to find primary source at this time.  Lytic lesions difficult to truly characterize in setting of profound osteopenia. - - No heart failure and normal liver function.  Cell lines mostly stable, do not suspect the bone marrow biopsy would be immediately helpful.  Fluid density lesion of the right gluteal musculature appears to have simple fluid collection, not likely malignant per radiology review. -CT abdomen pelvis 5/25: Chronic 6 mm hepatic lobe lesion, stable in size.  3 x 2.5 cm well-circumscribed fluid density lesion above the right gluteal musculature, small volume ascites containing umbilical hernia, small fat-containing right inguinal hernia.  Diffusely heterogenous axial and appendicular skeleton with multiple underlying lytic lesions, redemonstration of a chronic S3 sacral foramina fluid density lesion expanding diaphragm and is suggestive of a Tarlov cyst. - Patient has established follow-up with Dr. [facility].  - Multiple myeloma panel sent, on review does not appear diagnostic for multiple myeloma but pending oncology interpretation - Peripheral smear review was unremarkable WBC morphology - Chest CT without primary lesion.  No significant adenopathy - Had PET scan scheduled for inpatient but nuclear medicine team would not allow to occur while inpatient. We will try again to pursue this, if not, may need it  as an outpt  Nausea, vomiting - Resolved at this time.  Has been chronic and intermittent for the last 3 weeks - Workup on prior admission reveals COVID/RSV/flu negative.  TSH and hemoglobin A1c unremarkable - She has been tolerating her diet for now -  continue with as needed Zofran    Chronic diarrhea - Patient endorses intermittent diarrhea for the last many months - GI PCR negative - Would benefit from outpatient GI follow-up.  Leukocytosis - WBC 12.9 on arrival.  No infectious signs or symptoms - Was previously elevated on prior admission and downtrended without intervention.  May be reactive vs malignancy associated -Continue to trend CBC. - Urine culture 5/25 with 30,000 colonies of lactobacillus.  Patient completed course of Keflex.  Denies any urinary symptoms - Chest x-ray without infiltrates   Thrombocytosis - Platelets 437 on arrival, 472 on 5/30 which downtrended to normal without intervention. - Leukocytosis and thrombocytosis likely in some part related to underlying malignancy/multiple myeloma.  Workup as above.   History of breast cancer, 2013.  In remission since 2018. - Status post left mastectomy -- Has had annual mammograms without recurrence. Most recent 10/2022 WNL   X-linked hypophosphatemia rickets - Follows with [facility] endocrinology - History of bilateral femoral stress fractures 2019, tibial fracture 2021 - Receives Cryvista injections  monthly.  Scheduled for next dose this week. Holding for now.   B12 deficiency - Continue outpatient supplements at discharge   History of iron deficiency anemia - Continue with iron and B12 supplementation at discharge - Hemoglobin appears near baseline at this time - Trend CBC   Grief reaction - Continue sertraline  50mg  dose    Mixed hyperlipidemia - Continue statin   Hypertension - Pressure currently at goal without antihypertensives.  Will monitor.  Resume home meds as needed     Hypoalbuminemia Hypocalcemia - Correct electrolytes as needed - Trend daily CMP   Thyroid nodule - 1.7 cm right thyroid nodule seen on chest CT.  Outpatient follow-up for thyroid ultrasound - TSH within normal limits    DVT prophylaxis: Heparin    Code Status: Full Code Disposition:  Inpatient, work up on going.  Consultants:   - Onco  Procedures:  US  Guided Paracentesis  Antimicrobials:  Anti-infectives (From admission, onward)    None       Data Reviewed: I have personally reviewed following labs and imaging studies CBC: Recent Labs  Lab 09/19/23 0700 09/20/23 0642 09/21/23 1229 09/21/23 1526 09/22/23 0506 09/23/23 0444  WBC 8.6 9.1 12.9* 14.0* 9.7 10.9*  NEUTROABS 6.8 7.2 10.8*  --  7.6 8.6*  HGB 10.3* 10.2* 11.3* 11.4* 9.5* 10.6*  HCT 31.3* 32.0* 36.0 36.5 30.2* 33.3*  MCV 88.4 88.2 90.7 90.6 89.1 89.5  PLT 323 388 437* 466* 357 369   Basic Metabolic Panel: Recent Labs  Lab 09/19/23 0700 09/20/23 0642 09/21/23 1229 09/21/23 1526 09/22/23 0506 09/22/23 1749 09/23/23 0444  NA 135 135 135  --  136 134* 134*  K 3.7 3.5 3.8  --  3.8 3.7 3.7  CL 108 109 106  --  107 107 106  CO2 19* 19* 18*  --  19* 16* 17*  GLUCOSE 96 121* 143*  --  95 104* 106*  BUN 37* 27* 28*  --  31* 34* 36*  CREATININE 1.74* 1.23* 1.77* 1.80* 1.92* 2.35* 2.61*  CALCIUM  7.8* 7.9* 8.1*  --  7.9* 8.0* 8.1*  MG 1.5* 2.5*  --   --  2.1  --  2.0  PHOS 2.3* 3.1  --   --  3.1  --  3.2   GFR: Estimated Creatinine Clearance: 12.1 mL/min (A) (by C-G formula based on SCr of 2.61 mg/dL (H)). Liver Function Tests: Recent Labs  Lab 09/20/23 0642 09/21/23 1229 09/22/23 0506 09/22/23 1749 09/23/23 0444  AST 18 23 18 20 21   ALT 13 13 12 13 13   ALKPHOS 107 120 100 112 116  BILITOT 0.5 0.5 0.7 0.6 0.7  PROT 5.4* 6.1* 5.1* 5.5* 5.6*  ALBUMIN 2.4* 2.7* 2.3* 2.5* 2.5*   CBG: No results for input(s): "GLUCAP" in the last 168 hours.  Recent Results (from the past 240 hours)  Resp  panel by RT-PCR (RSV, Flu A&B, Covid) Anterior Nasal Swab     Status: None   Collection Time: 09/18/23  8:02 PM   Specimen: Anterior Nasal Swab  Result Value Ref Range Status   SARS Coronavirus 2 by RT PCR NEGATIVE NEGATIVE Final    Comment: (NOTE) SARS-CoV-2 target nucleic acids are NOT DETECTED.  The SARS-CoV-2 RNA is generally detectable in upper respiratory specimens during the acute phase of infection. The lowest concentration of SARS-CoV-2 viral copies this assay can detect is 138 copies/mL. A negative result does not preclude SARS-Cov-2 infection and should not be used as the sole basis for treatment  or other patient management decisions. A negative result may occur with  improper specimen collection/handling, submission of specimen other than nasopharyngeal swab, presence of viral mutation(s) within the areas targeted by this assay, and inadequate number of viral copies(<138 copies/mL). A negative result must be combined with clinical observations, patient history, and epidemiological information. The expected result is Negative.  Fact Sheet for Patients:  BloggerCourse.com  Fact Sheet for Healthcare Providers:  SeriousBroker.it  This test is no t yet approved or cleared by the [place]  FDA and  has been authorized for detection and/or diagnosis of SARS-CoV-2 by FDA under an Emergency Use Authorization (EUA). This EUA will remain  in effect (meaning this test can be used) for the duration of the COVID-19 declaration under Section 564(b)(1) of the Act, 21 U.S.C.section 360bbb-3(b)(1), unless the authorization is terminated  or revoked sooner.       Influenza A by PCR NEGATIVE NEGATIVE Final   Influenza B by PCR NEGATIVE NEGATIVE Final    Comment: (NOTE) The Xpert Xpress SARS-CoV-2/FLU/RSV plus assay is intended as an aid in the diagnosis of influenza from Nasopharyngeal swab specimens and should not be used as a  sole basis for treatment. Nasal washings and aspirates are unacceptable for Xpert Xpress SARS-CoV-2/FLU/RSV testing.  Fact Sheet for Patients: BloggerCourse.com  Fact Sheet for Healthcare Providers: SeriousBroker.it  This test is not yet approved or cleared by the [place]  FDA and has been authorized for detection and/or diagnosis of SARS-CoV-2 by FDA under an Emergency Use Authorization (EUA). This EUA will remain in effect (meaning this test can be used) for the duration of the COVID-19 declaration under Section 564(b)(1) of the Act, 21 U.S.C. section 360bbb-3(b)(1), unless the authorization is terminated or revoked.     Resp Syncytial Virus by PCR NEGATIVE NEGATIVE Final    Comment: (NOTE) Fact Sheet for Patients: BloggerCourse.com  Fact Sheet for Healthcare Providers: SeriousBroker.it  This test is not yet approved or cleared by the [place]  FDA and has been authorized for detection and/or diagnosis of SARS-CoV-2 by FDA under an Emergency Use Authorization (EUA). This EUA will remain in effect (meaning this test can be used) for the duration of the COVID-19 declaration under Section 564(b)(1) of the Act, 21 U.S.C. section 360bbb-3(b)(1), unless the authorization is terminated or revoked.  Performed at [facility], [address] [place], [place] [address]   Gastrointestinal Panel by PCR , Stool     Status: None   Collection Time: 09/20/23  4:34 AM   Specimen: Stool  Result Value Ref Range Status   Campylobacter species NOT DETECTED NOT DETECTED Final   Plesimonas shigelloides NOT DETECTED NOT DETECTED Final   Salmonella species NOT DETECTED NOT DETECTED Final   Yersinia enterocolitica NOT DETECTED NOT DETECTED Final   Vibrio species NOT DETECTED NOT DETECTED Final   Vibrio cholerae NOT DETECTED NOT DETECTED Final   Enteroaggregative E coli (EAEC)  NOT DETECTED NOT DETECTED Final   Enteropathogenic E coli (EPEC) NOT DETECTED NOT DETECTED Final   Enterotoxigenic E coli (ETEC) NOT DETECTED NOT DETECTED Final   Shiga like toxin producing E coli (STEC) NOT DETECTED NOT DETECTED Final   Shigella/Enteroinvasive E coli (EIEC) NOT DETECTED NOT DETECTED Final   Cryptosporidium NOT DETECTED NOT DETECTED Final   Cyclospora cayetanensis NOT DETECTED NOT DETECTED Final   Entamoeba histolytica NOT DETECTED NOT DETECTED Final   Giardia lamblia NOT DETECTED NOT DETECTED Final   Adenovirus F40/41 NOT DETECTED NOT DETECTED Final   Astrovirus NOT  DETECTED NOT DETECTED Final   Norovirus GI/GII NOT DETECTED NOT DETECTED Final   Rotavirus A NOT DETECTED NOT DETECTED Final   Sapovirus (I, II, IV, and V) NOT DETECTED NOT DETECTED Final    Comment: Performed at [facility], [address] [place], [place] [address]  Body fluid culture w Gram Stain     Status: None (Preliminary result)   Collection Time: 09/23/23 10:47 AM   Specimen: PATH Cytology Peritoneal fluid  Result Value Ref Range Status   Specimen Description   Final    FLUID Performed at [facility], [address] [place], [place] [address]    Special Requests   Final    PERITONEAL Performed at [facility], [address], [place], [place] [address]    Gram Stain PENDING  Incomplete   Culture PENDING  Incomplete   Report Status PENDING  Incomplete     Radiology Studies: US  Paracentesis Result Date: 09/23/2023 INDICATION: 78 year old female with a history of CKD IIIa, breast cancer, and currently undergoing workup for multiple myeloma. She presented to the ED due to lower extremity swelling and volume overload. Request for diagnostic and therapeutic paracentesis. EXAM: ULTRASOUND GUIDED right PARACENTESIS MEDICATIONS: 1% lidocaine, 7 mL COMPLICATIONS: None immediate. PROCEDURE: Informed written consent was obtained from the patient after a discussion of the risks,  benefits and alternatives to treatment. A timeout was performed prior to the initiation of the procedure. Initial ultrasound scanning demonstrates a large amount of ascites within the right lower abdominal quadrant. The right lower abdomen was prepped and draped in the usual sterile fashion. 1% lidocaine was used for local anesthesia. Following this, a 19 gauge, 7-cm, Yueh catheter was introduced. An ultrasound image was saved for documentation purposes. The paracentesis was performed. The catheter was removed and a dressing was applied. The patient tolerated the procedure well without immediate post procedural complication. FINDINGS: A total of approximately 3.5 L of amber fluid was removed. Samples were sent to the laboratory as requested by the clinical team. IMPRESSION: Successful ultrasound-guided paracentesis yielding 3.5 liters of peritoneal fluid. Procedure performed by: Estella Helling, PA-C under the supervision of Dr. Baldemar Lev Electronically Signed   By: Fernando Hoyer M.D.   On: 09/23/2023 15:40   ECHOCARDIOGRAM COMPLETE Result Date: 09/23/2023    ECHOCARDIOGRAM REPORT   Patient Name:   Felicia Robinson [facility] Date of Exam: 09/22/2023 Medical Rec #:  [id number]           Height:       54.0 in Accession #:    [id number]          Weight:       129.4 lb Date of Birth:  August 18, 1945            BSA:          1.437 m Patient Age:    78 years            BP:           108/94 mmHg Patient Gender: F                   HR:           94 bpm. Exam Location:  [facility] Procedure: 2D Echo, Cardiac Doppler and Color Doppler (Both Spectral and Color            Flow Doppler were utilized during procedure). Indications:     I50.31 Acute Diastolic CHF  History:  Patient has no prior history of Echocardiogram examinations.                  Risk Factors:Hypertension. History of Breast Cancer.  Sonographer:     Brigid Canada RDCS Referring Phys:  [id number] ALEXANDRA DEZII Diagnosing Phys: Sammy Crisp MD IMPRESSIONS   1. Left ventricular ejection fraction, by estimation, is 70 to 75%. The left ventricle has hyperdynamic function. The left ventricle has no regional wall motion abnormalities. Left ventricular diastolic parameters were normal.  2. Right ventricular systolic function is normal. The right ventricular size is mildly enlarged. Tricuspid regurgitation signal is inadequate for assessing PA pressure.  3. The mitral valve is normal in structure. No evidence of mitral valve regurgitation. No evidence of mitral stenosis.  4. The aortic valve is tricuspid. There is mild thickening of the aortic valve. Aortic valve regurgitation is trivial. Aortic valve sclerosis is present, with no evidence of aortic valve stenosis. FINDINGS  Left Ventricle: Left ventricular ejection fraction, by estimation, is 70 to 75%. The left ventricle has hyperdynamic function. The left ventricle has no regional wall motion abnormalities. The left ventricular internal cavity size was normal in size. There is no left ventricular hypertrophy. Left ventricular diastolic parameters were normal. Right Ventricle: The right ventricular size is mildly enlarged. No increase in right ventricular wall thickness. Right ventricular systolic function is normal. Tricuspid regurgitation signal is inadequate for assessing PA pressure. Left Atrium: Left atrial size was normal in size. Right Atrium: Right atrial size was normal in size. Pericardium: Trivial pericardial effusion is present. Mitral Valve: The mitral valve is normal in structure. No evidence of mitral valve regurgitation. No evidence of mitral valve stenosis. Tricuspid Valve: The tricuspid valve is not well visualized. Tricuspid valve regurgitation is trivial. Aortic Valve: The aortic valve is tricuspid. There is mild thickening of the aortic valve. Aortic valve regurgitation is trivial. Aortic valve sclerosis is present, with no evidence of aortic valve stenosis. Aortic valve mean gradient measures 6.5  mmHg. Aortic valve peak gradient measures 12.5 mmHg. Pulmonic Valve: The pulmonic valve was not well visualized. Pulmonic valve regurgitation is trivial. No evidence of pulmonic stenosis. Aorta: The aortic root is normal in size and structure. Pulmonary Artery: The pulmonary artery is of normal size. Venous: The inferior vena cava was not well visualized. IAS/Shunts: The interatrial septum was not well visualized.  LEFT VENTRICLE PLAX 2D LVIDd:         4.60 cm Diastology LVIDs:         2.40 cm LV e' medial:    7.89 cm/s LV PW:         0.80 cm LV E/e' medial:  6.2 LV IVS:        0.80 cm LV e' lateral:   12.20 cm/s                        LV E/e' lateral: 4.0  RIGHT VENTRICLE RV Basal diam:  3.70 cm RV S prime:     34.22 cm/s TAPSE (M-mode): 3.0 cm LEFT ATRIUM             Index        RIGHT ATRIUM           Index LA diam:        4.40 cm 3.06 cm/m   RA Area:     12.10 cm LA Vol (A2C):   24.2 ml 16.83 ml/m  RA Volume:   24.20 ml  16.83 ml/m LA Vol (A4C):   25.7 ml 17.88 ml/m LA Biplane Vol: 25.7 ml 17.88 ml/m  AORTIC VALVE AV Vmax:           176.46 cm/s AV Vmean:          120.312 cm/s AV VTI:            0.297 m AV Peak Grad:      12.5 mmHg AV Mean Grad:      6.5 mmHg LVOT Vmax:         163.67 cm/s LVOT Vmean:        109.667 cm/s LVOT VTI:          0.259 m LVOT/AV VTI ratio: 0.87  AORTA Ao Root diam: 3.30 cm MITRAL VALVE MV Area (PHT): 4.40 cm    SHUNTS MV Decel Time: 173 msec    Systemic VTI: 0.26 m MV E velocity: 48.80 cm/s MV A velocity: 93.40 cm/s MV E/A ratio:  0.52 Veryl Gottron End MD Electronically signed by Sammy Crisp MD Signature Date/Time: 09/23/2023/7:14:12 AM    Final    US  ASCITES (ABDOMEN LIMITED) Result Date: 09/22/2023 CLINICAL DATA:  Ascites. EXAM: LIMITED ABDOMEN ULTRASOUND FOR ASCITES TECHNIQUE: Limited ultrasound survey for ascites was performed in all four abdominal quadrants. COMPARISON:  CT 09/13/2023 FINDINGS: Moderate to large volume of ascites, greatest in the pelvis and left upper  quadrant. IMPRESSION: Moderate to large volume abdominopelvic ascites. Electronically Signed   By: Chadwick Colonel M.D.   On: 09/22/2023 19:10   US  RENAL Result Date: 09/22/2023 CLINICAL DATA:  Acute kidney injury EXAM: RENAL / URINARY TRACT ULTRASOUND COMPLETE COMPARISON:  None Available. FINDINGS: Right Kidney: Renal measurements: 9.3 x 4.1 x 4.6 cm = volume: 90 mL. Echogenicity is increased. No mass or hydronephrosis visualized. Left Kidney: Renal measurements: 8.1 x 4.1 x 2.7 cm = volume: 43 mL. Echogenicity is increased. No mass or hydronephrosis visualized. Bladder: Nondistended. Other: None. IMPRESSION: 1. No hydronephrosis. 2. Increased echogenicity of the kidneys bilaterally consistent with medical renal disease. Electronically Signed   By: Tyron Gallon M.D.   On: 09/22/2023 19:10    Scheduled Meds:  aspirin  81 mg Oral Daily   feeding supplement  237 mL Oral BID BM   ferrous sulfate  325 mg Oral Q breakfast   heparin   5,000 Units Subcutaneous Q8H   rosuvastatin   10 mg Oral Daily   sertraline   75 mg Oral Daily   Time Spent: 35 mins   LOS: 1 day  MDM: Patient is high risk for one or more organ failure.  They necessitate ongoing hospitalization for continued IV therapies and subsequent lab monitoring. Total time spent interpreting labs and vitals, reviewing the medical record, coordinating care amongst consultants and care team members, directly assessing and discussing care with the patient and/or family: 55 min  Eliette Drumwright Mason Sole, MD [facility]  To contact the attending physician between 7A-7P please use Epic Chat. To contact the covering physician during after hours 7P-7A, please review Amion.  09/23/2023, 10:07 PM   *This document has been created with the assistance of dictation software. Please excuse typographical errors. *

## 2023-09-23 NOTE — Consult Note (Addendum)
 [facility]  CONSULT NOTE    Date: 09/23/2023                  Patient Name:  Felicia Robinson  MRN: [id number]  DOB: May 03, 1945  Age / Sex: 78 y.o., female         PCP: Lemar Pyles, NP                 Service Requesting Consult: TRH                 Reason for Consult: Acute kidney injury            History of Present Illness: Ms. Felicia Robinson is a 78 y.o.  female with past medical history of anemia,Osteoporosis, hypophosphatemia with rickets, chronic kidney disease stage III A, who was admitted to [facility] on 09/21/2023 for Abdominal distension [R14.0] Lower extremity edema [R60.0] Volume overload [E87.70] Dyspnea, unspecified type [R06.00] AKI (acute kidney injury) (HCC) [N17.9]  Patient presented to the emergency department via EMS from PCPs office complaining of shortness of breath.  Patient states it began the night prior to arrival.  She was recently admitted and treated for AKI due to nausea vomiting.  Denies any recent episodes of nausea or vomiting.  Does continue to have poor oral intake.  Creatinine on ED arrival 1.77 with GFR 29.  Creatinine has increased to 2.61 with GFR 18.  Patient did undergo CT chest with contrast which Modha small left pleural effusion, moderate ascites, and a right thyroid nodule.  Renal ultrasound was negative for obstruction.  Abdominal ultrasound shows moderate to large volume ascites.  Paracentesis scheduled.   Medications: Outpatient medications: Medications Prior to Admission  Medication Sig Dispense Refill Last Dose/Taking   aspirin 81 MG chewable tablet Chew 81 mg by mouth daily.   09/14/2023   Cholecalciferol (VITAMIN D3) 50 MCG (2000 UT) TABS Take 2,000 Units by mouth daily.   09/14/2023   Cyanocobalamin  (VITAMIN B12 PO) Take 3,000 mcg by mouth daily.   09/14/2023   Ferrous Sulfate (SLOW FE) 142 (45 Fe) MG TBCR Take 1 tablet by mouth daily. Take with a little orange juice. 90 tablet 4 09/14/2023    rosuvastatin  (CRESTOR ) 5 MG tablet TAKE 1 TABLET(5 MG) BY MOUTH DAILY 90 tablet 4 09/14/2023   sertraline  (ZOLOFT ) 50 MG tablet Take 1.5 tablets (75 mg total) by mouth daily. 135 tablet 4 09/14/2023   [Paused] Nebivolol  HCl 20 MG TABS TAKE 1 TABLET(20 MG) BY MOUTH DAILY (Patient not taking: Reported on 09/21/2023) 90 tablet 4 Not Taking   [Paused] olmesartan -hydrochlorothiazide (BENICAR  HCT) 40-12.5 MG tablet Take 1 tablet by mouth daily. (Patient not taking: Reported on 09/21/2023) 90 tablet 4 Not Taking   ondansetron  (ZOFRAN -ODT) 4 MG disintegrating tablet Take 1 tablet (4 mg total) by mouth every 8 (eight) hours as needed for nausea or vomiting. (Patient not taking: Reported on 09/21/2023) 20 tablet 0 Not Taking    Current medications: Current Facility-Administered Medications  Medication Dose Route Frequency Provider Last Rate Last Admin   acetaminophen  (TYLENOL ) tablet 650 mg  650 mg Oral Q6H PRN Dezii, Alexandra, DO       aspirin chewable tablet 81 mg  81 mg Oral Daily Dezii, Alexandra, DO   81 mg at 09/23/23 4098   ferrous sulfate tablet 325 mg  325 mg Oral Q breakfast Dezii, Alexandra, DO   325 mg at 09/23/23 0826   heparin  injection 5,000 Units  5,000 Units Subcutaneous  Q8H Dezii, Alexandra, DO   5,000 Units at 09/23/23 0600   ipratropium-albuterol (DUONEB) 0.5-2.5 (3) MG/3ML nebulizer solution 3 mL  3 mL Nebulization Q6H PRN Dezii, Alexandra, DO       Muscle Rub CREA 1 Application  1 Application Topical PRN Dezii, Alexandra, DO       rosuvastatin  (CRESTOR ) tablet 10 mg  10 mg Oral Daily Dezii, Alexandra, DO   10 mg at 09/23/23 0826   sertraline  (ZOLOFT ) tablet 75 mg  75 mg Oral Daily Dezii, Alexandra, DO   75 mg at 09/23/23 0825   sodium chloride  (OCEAN) 0.65 % nasal spray 1 spray  1 spray Each Nare PRN Dezii, Alexandra, DO          Allergies: Allergies  Allergen Reactions   Allopurinol      diarrhea   Codeine Nausea And Vomiting   Other     Pain medications Altered mental status    Shellfish Allergy Diarrhea and Nausea And Vomiting    Just when eating clams Just when eating clams Just when eating clams       Past Medical History: Past Medical History:  Diagnosis Date   Arthritis    Breast cancer (HCC) 2013   left breast   Cancer (HCC) 2013   L breast   Depression 2021   Disorders of phosphorus metabolism    rickets   Fibroadenosis of breast    Gout 2013   Hypertension 2009   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 ER 90%, PR 1%, HER-2/neu not overexpressing.   Osteomalacia    Renal disorder    CKD early stage   Rickets    congenital   Shingles 2013     Past Surgical History: Past Surgical History:  Procedure Laterality Date   BREAST SURGERY Left 09/30/2011   Left simple mastectomy with sentinel node biopsy   CATARACT EXTRACTION, BILATERAL Bilateral 04/2015   Dr. Demetrios Finders in [place]   COLONOSCOPY  2012   DENTAL SURGERY  03/25/2023   DILATION AND CURETTAGE OF UTERUS  1982   MASTECTOMY Left 2013   with SN biopsy   TONSILLECTOMY  1953   UPPER GI ENDOSCOPY  2012     Family History: Family History  Problem Relation Age of Onset   Alzheimer's disease Mother    Stroke Father    Lymphoma Brother 38       Hodgkin's Lymphoma   Cancer Brother        non hodgkins lymphoma    Heart disease Brother    Cancer Maternal Uncle 19       cancer of the esophagus   Cancer Paternal Uncle    Heart disease Maternal Grandfather    Rickets Son    Breast cancer Neg Hx      Social History: Social History   Socioeconomic History   Marital status: Widowed    Spouse name: Sonny   Number of children: 1   Years of education: some college   Highest education level: Associate degree: occupational, Scientist, product/process development, or vocational program  Occupational History   Occupation: Retired  Tobacco Use   Smoking status: Former    Current packs/day: 0.00    Average  packs/day: 0.5 packs/day for 30.0 years (15.0 ttl pk-yrs)    Types: Cigarettes    Start date: 04/21/1976    Quit date: 04/21/2006    Years since quitting: 17.4   Smokeless tobacco: Never  Vaping Use   Vaping status: Never Used  Substance and Sexual Activity   Alcohol use: Yes    Alcohol/week: 1.0 standard drink of alcohol    Types: 1 Glasses of wine per week    Comment: occ   Drug use: No   Sexual activity: Not Currently  Other Topics Concern   Not on file  Social History Narrative   Not on file   Social Drivers of Health   Financial Resource Strain: Low Risk  (09/20/2023)   Overall Financial Resource Strain (CARDIA)    Difficulty of Paying Living Expenses: Not hard at all  Food Insecurity: No Food Insecurity (09/21/2023)   Hunger Vital Sign    Worried About Running Out of Food in the Last Year: Never true    Ran Out of Food in the Last Year: Never true  Transportation Needs: No Transportation Needs (09/21/2023)   PRAPARE - Administrator, Civil Service (Medical): No    Lack of Transportation (Non-Medical): No  Physical Activity: Inactive (09/20/2023)   Exercise Vital Sign    Days of Exercise per Week: 0 days    Minutes of Exercise per Session: 20 min  Stress: No Stress Concern Present (09/20/2023)   [employer] of Occupational Health - Occupational Stress Questionnaire    Feeling of Stress : Only a little  Social Connections: Moderately Isolated (09/21/2023)   Social Connection and Isolation Panel [NHANES]    Frequency of Communication with Friends and Family: More than three times a week    Frequency of Social Gatherings with Friends and Family: Once a week    Attends Religious Services: 1 to 4 times per year    Active Member of [employer] or Organizations: No    Attends Banker Meetings: Never    Marital Status: Widowed  Intimate Partner Violence: Not At Risk (09/21/2023)   Humiliation, Afraid, Rape, and Kick questionnaire    Fear of Current or Ex-Partner:  No    Emotionally Abused: No    Physically Abused: No    Sexually Abused: No     Review of Systems: Review of Systems  Constitutional:  Negative for chills, fever and malaise/fatigue.  HENT:  Negative for congestion, sore throat and tinnitus.   Eyes:  Negative for blurred vision and redness.  Respiratory:  Positive for shortness of breath. Negative for cough and wheezing.   Cardiovascular:  Positive for leg swelling. Negative for chest pain, palpitations and claudication.  Gastrointestinal:  Negative for abdominal pain, blood in stool, diarrhea, nausea and vomiting.  Genitourinary:  Negative for flank pain, frequency and hematuria.  Musculoskeletal:  Negative for back pain, falls and myalgias.  Skin:  Negative for rash.  Neurological:  Negative for dizziness, weakness and headaches.  Endo/Heme/Allergies:  Does not bruise/bleed easily.  Psychiatric/Behavioral:  Negative for depression. The patient is not nervous/anxious and does not have insomnia.     Vital Signs: Blood pressure 111/75, pulse 91, temperature 98.2 F (36.8 C), temperature source Oral, resp. rate 20, height 4\' 6"  (1.372 m), weight 58.7 kg, SpO2 95%.  Weight trends: Filed Weights   09/21/23 1212  Weight: 58.7 kg    Physical Exam: General: NAD  Head: Normocephalic, atraumatic. Moist oral mucosal membranes  Eyes: Anicteric  Lungs:  Clear to auscultation, normal effort  Heart: Regular rate and rhythm  Abdomen:  Soft, nontender, distended  Extremities:  1+ peripheral edema.  Neurologic: Alert, moving all four extremities  Skin: No lesions  Lab results: Basic Metabolic Panel: Recent Labs  Lab 09/19/23 0700 09/20/23 0642 09/21/23 1229 09/22/23 0506 09/22/23 1749 09/23/23 0444  NA 135 135   < > 136 134* 134*  K 3.7 3.5   < > 3.8 3.7 3.7  CL 108 109   < > 107 107 106  CO2 19* 19*   < > 19* 16* 17*  GLUCOSE 96 121*   < > 95 104* 106*  BUN 37* 27*   < > 31* 34* 36*  CREATININE 1.74* 1.23*   < >  1.92* 2.35* 2.61*  CALCIUM  7.8* 7.9*   < > 7.9* 8.0* 8.1*  MG 1.5* 2.5*  --  2.1  --  2.0  PHOS 2.3* 3.1  --  3.1  --  3.2   < > = values in this interval not displayed.    Liver Function Tests: Recent Labs  Lab 09/22/23 0506 09/22/23 1749 09/23/23 0444  AST 18 20 21   ALT 12 13 13   ALKPHOS 100 112 116  BILITOT 0.7 0.6 0.7  PROT 5.1* 5.5* 5.6*  ALBUMIN 2.3* 2.5* 2.5*   No results for input(s): "LIPASE", "AMYLASE" in the last 168 hours. No results for input(s): "AMMONIA" in the last 168 hours.  CBC: Recent Labs  Lab 09/20/23 0642 09/21/23 1229 09/21/23 1526 09/22/23 0506 09/23/23 0444  WBC 9.1 12.9* 14.0* 9.7 10.9*  NEUTROABS 7.2 10.8*  --  7.6 8.6*  HGB 10.2* 11.3* 11.4* 9.5* 10.6*  HCT 32.0* 36.0 36.5 30.2* 33.3*  MCV 88.2 90.7 90.6 89.1 89.5  PLT 388 437* 466* 357 369    Cardiac Enzymes: No results for input(s): "CKTOTAL", "CKMB", "CKMBINDEX", "TROPONINI" in the last 168 hours.  BNP: Invalid input(s): "POCBNP"  CBG: No results for input(s): "GLUCAP" in the last 168 hours.  Microbiology: Results for orders placed or performed during the hospital encounter of 09/18/23  Resp panel by RT-PCR (RSV, Flu A&B, Covid) Anterior Nasal Swab     Status: None   Collection Time: 09/18/23  8:02 PM   Specimen: Anterior Nasal Swab  Result Value Ref Range Status   SARS Coronavirus 2 by RT PCR NEGATIVE NEGATIVE Final    Comment: (NOTE) SARS-CoV-2 target nucleic acids are NOT DETECTED.  The SARS-CoV-2 RNA is generally detectable in upper respiratory specimens during the acute phase of infection. The lowest concentration of SARS-CoV-2 viral copies this assay can detect is 138 copies/mL. A negative result does not preclude SARS-Cov-2 infection and should not be used as the sole basis for treatment or other patient management decisions. A negative result may occur with  improper specimen collection/handling, submission of specimen other than nasopharyngeal swab, presence of  viral mutation(s) within the areas targeted by this assay, and inadequate number of viral copies(<138 copies/mL). A negative result must be combined with clinical observations, patient history, and epidemiological information. The expected result is Negative.  Fact Sheet for Patients:  BloggerCourse.com  Fact Sheet for Healthcare Providers:  SeriousBroker.it  This test is no t yet approved or cleared by the [place]  FDA and  has been authorized for detection and/or diagnosis of SARS-CoV-2 by FDA under an Emergency Use Authorization (EUA). This EUA will remain  in effect (meaning this test can be used) for the duration of the COVID-19 declaration under Section 564(b)(1) of the Act, 21 U.S.C.section 360bbb-3(b)(1), unless the authorization is terminated  or revoked sooner.       Influenza A by PCR NEGATIVE NEGATIVE Final   Influenza B by PCR  NEGATIVE NEGATIVE Final    Comment: (NOTE) The Xpert Xpress SARS-CoV-2/FLU/RSV plus assay is intended as an aid in the diagnosis of influenza from Nasopharyngeal swab specimens and should not be used as a sole basis for treatment. Nasal washings and aspirates are unacceptable for Xpert Xpress SARS-CoV-2/FLU/RSV testing.  Fact Sheet for Patients: BloggerCourse.com  Fact Sheet for Healthcare Providers: SeriousBroker.it  This test is not yet approved or cleared by the [place]  FDA and has been authorized for detection and/or diagnosis of SARS-CoV-2 by FDA under an Emergency Use Authorization (EUA). This EUA will remain in effect (meaning this test can be used) for the duration of the COVID-19 declaration under Section 564(b)(1) of the Act, 21 U.S.C. section 360bbb-3(b)(1), unless the authorization is terminated or revoked.     Resp Syncytial Virus by PCR NEGATIVE NEGATIVE Final    Comment: (NOTE) Fact Sheet for  Patients: BloggerCourse.com  Fact Sheet for Healthcare Providers: SeriousBroker.it  This test is not yet approved or cleared by the [place]  FDA and has been authorized for detection and/or diagnosis of SARS-CoV-2 by FDA under an Emergency Use Authorization (EUA). This EUA will remain in effect (meaning this test can be used) for the duration of the COVID-19 declaration under Section 564(b)(1) of the Act, 21 U.S.C. section 360bbb-3(b)(1), unless the authorization is terminated or revoked.  Performed at [facility], [address] [place], [place] [address]   Gastrointestinal Panel by PCR , Stool     Status: None   Collection Time: 09/20/23  4:34 AM   Specimen: Stool  Result Value Ref Range Status   Campylobacter species NOT DETECTED NOT DETECTED Final   Plesimonas shigelloides NOT DETECTED NOT DETECTED Final   Salmonella species NOT DETECTED NOT DETECTED Final   Yersinia enterocolitica NOT DETECTED NOT DETECTED Final   Vibrio species NOT DETECTED NOT DETECTED Final   Vibrio cholerae NOT DETECTED NOT DETECTED Final   Enteroaggregative E coli (EAEC) NOT DETECTED NOT DETECTED Final   Enteropathogenic E coli (EPEC) NOT DETECTED NOT DETECTED Final   Enterotoxigenic E coli (ETEC) NOT DETECTED NOT DETECTED Final   Shiga like toxin producing E coli (STEC) NOT DETECTED NOT DETECTED Final   Shigella/Enteroinvasive E coli (EIEC) NOT DETECTED NOT DETECTED Final   Cryptosporidium NOT DETECTED NOT DETECTED Final   Cyclospora cayetanensis NOT DETECTED NOT DETECTED Final   Entamoeba histolytica NOT DETECTED NOT DETECTED Final   Giardia lamblia NOT DETECTED NOT DETECTED Final   Adenovirus F40/41 NOT DETECTED NOT DETECTED Final   Astrovirus NOT DETECTED NOT DETECTED Final   Norovirus GI/GII NOT DETECTED NOT DETECTED Final   Rotavirus A NOT DETECTED NOT DETECTED Final   Sapovirus (I, II, IV, and V) NOT DETECTED NOT DETECTED Final     Comment: Performed at [facility], [address] [place], [place] [address]    Coagulation Studies: Recent Labs    09/21/23 1229  LABPROT 15.2  INR 1.2    Urinalysis: No results for input(s): "COLORURINE", "LABSPEC", "PHURINE", "GLUCOSEU", "HGBUR", "BILIRUBINUR", "KETONESUR", "PROTEINUR", "UROBILINOGEN", "NITRITE", "LEUKOCYTESUR" in the last 72 hours.  Invalid input(s): "APPERANCEUR"    Imaging: ECHOCARDIOGRAM COMPLETE Result Date: 09/23/2023    ECHOCARDIOGRAM REPORT   Patient Name:   SHELL BLANCHETTE [facility] Date of Exam: 09/22/2023 Medical Rec #:  [id number]           Height:       54.0 in Accession #:    [id number]          Weight:  129.4 lb Date of Birth:  03-19-1946            BSA:          1.437 m Patient Age:    78 years            BP:           108/94 mmHg Patient Gender: F                   HR:           94 bpm. Exam Location:  [facility] Procedure: 2D Echo, Cardiac Doppler and Color Doppler (Both Spectral and Color            Flow Doppler were utilized during procedure). Indications:     I50.31 Acute Diastolic CHF  History:         Patient has no prior history of Echocardiogram examinations.                  Risk Factors:Hypertension. History of Breast Cancer.  Sonographer:     Brigid Canada RDCS Referring Phys:  [id number] ALEXANDRA DEZII Diagnosing Phys: Sammy Crisp MD IMPRESSIONS  1. Left ventricular ejection fraction, by estimation, is 70 to 75%. The left ventricle has hyperdynamic function. The left ventricle has no regional wall motion abnormalities. Left ventricular diastolic parameters were normal.  2. Right ventricular systolic function is normal. The right ventricular size is mildly enlarged. Tricuspid regurgitation signal is inadequate for assessing PA pressure.  3. The mitral valve is normal in structure. No evidence of mitral valve regurgitation. No evidence of mitral stenosis.  4. The aortic valve is tricuspid. There is mild thickening of the aortic valve.  Aortic valve regurgitation is trivial. Aortic valve sclerosis is present, with no evidence of aortic valve stenosis. FINDINGS  Left Ventricle: Left ventricular ejection fraction, by estimation, is 70 to 75%. The left ventricle has hyperdynamic function. The left ventricle has no regional wall motion abnormalities. The left ventricular internal cavity size was normal in size. There is no left ventricular hypertrophy. Left ventricular diastolic parameters were normal. Right Ventricle: The right ventricular size is mildly enlarged. No increase in right ventricular wall thickness. Right ventricular systolic function is normal. Tricuspid regurgitation signal is inadequate for assessing PA pressure. Left Atrium: Left atrial size was normal in size. Right Atrium: Right atrial size was normal in size. Pericardium: Trivial pericardial effusion is present. Mitral Valve: The mitral valve is normal in structure. No evidence of mitral valve regurgitation. No evidence of mitral valve stenosis. Tricuspid Valve: The tricuspid valve is not well visualized. Tricuspid valve regurgitation is trivial. Aortic Valve: The aortic valve is tricuspid. There is mild thickening of the aortic valve. Aortic valve regurgitation is trivial. Aortic valve sclerosis is present, with no evidence of aortic valve stenosis. Aortic valve mean gradient measures 6.5 mmHg. Aortic valve peak gradient measures 12.5 mmHg. Pulmonic Valve: The pulmonic valve was not well visualized. Pulmonic valve regurgitation is trivial. No evidence of pulmonic stenosis. Aorta: The aortic root is normal in size and structure. Pulmonary Artery: The pulmonary artery is of normal size. Venous: The inferior vena cava was not well visualized. IAS/Shunts: The interatrial septum was not well visualized.  LEFT VENTRICLE PLAX 2D LVIDd:         4.60 cm Diastology LVIDs:         2.40 cm LV e' medial:    7.89 cm/s LV PW:         0.80 cm LV  E/e' medial:  6.2 LV IVS:        0.80 cm LV e'  lateral:   12.20 cm/s                        LV E/e' lateral: 4.0  RIGHT VENTRICLE RV Basal diam:  3.70 cm RV S prime:     34.22 cm/s TAPSE (M-mode): 3.0 cm LEFT ATRIUM             Index        RIGHT ATRIUM           Index LA diam:        4.40 cm 3.06 cm/m   RA Area:     12.10 cm LA Vol (A2C):   24.2 ml 16.83 ml/m  RA Volume:   24.20 ml  16.83 ml/m LA Vol (A4C):   25.7 ml 17.88 ml/m LA Biplane Vol: 25.7 ml 17.88 ml/m  AORTIC VALVE AV Vmax:           176.46 cm/s AV Vmean:          120.312 cm/s AV VTI:            0.297 m AV Peak Grad:      12.5 mmHg AV Mean Grad:      6.5 mmHg LVOT Vmax:         163.67 cm/s LVOT Vmean:        109.667 cm/s LVOT VTI:          0.259 m LVOT/AV VTI ratio: 0.87  AORTA Ao Root diam: 3.30 cm MITRAL VALVE MV Area (PHT): 4.40 cm    SHUNTS MV Decel Time: 173 msec    Systemic VTI: 0.26 m MV E velocity: 48.80 cm/s MV A velocity: 93.40 cm/s MV E/A ratio:  0.52 Veryl Gottron End MD Electronically signed by Sammy Crisp MD Signature Date/Time: 09/23/2023/7:14:12 AM    Final    US  ASCITES (ABDOMEN LIMITED) Result Date: 09/22/2023 CLINICAL DATA:  Ascites. EXAM: LIMITED ABDOMEN ULTRASOUND FOR ASCITES TECHNIQUE: Limited ultrasound survey for ascites was performed in all four abdominal quadrants. COMPARISON:  CT 09/13/2023 FINDINGS: Moderate to large volume of ascites, greatest in the pelvis and left upper quadrant. IMPRESSION: Moderate to large volume abdominopelvic ascites. Electronically Signed   By: Chadwick Colonel M.D.   On: 09/22/2023 19:10   US  RENAL Result Date: 09/22/2023 CLINICAL DATA:  Acute kidney injury EXAM: RENAL / URINARY TRACT ULTRASOUND COMPLETE COMPARISON:  None Available. FINDINGS: Right Kidney: Renal measurements: 9.3 x 4.1 x 4.6 cm = volume: 90 mL. Echogenicity is increased. No mass or hydronephrosis visualized. Left Kidney: Renal measurements: 8.1 x 4.1 x 2.7 cm = volume: 43 mL. Echogenicity is increased. No mass or hydronephrosis visualized. Bladder: Nondistended. Other:  None. IMPRESSION: 1. No hydronephrosis. 2. Increased echogenicity of the kidneys bilaterally consistent with medical renal disease. Electronically Signed   By: Tyron Gallon M.D.   On: 09/22/2023 19:10   US  Venous Img Lower Bilateral Result Date: 09/21/2023 CLINICAL DATA:  Bilateral lower extremity swelling EXAM: BILATERAL LOWER EXTREMITY VENOUS DOPPLER ULTRASOUND TECHNIQUE: Gray-scale sonography with graded compression, as well as color Doppler and duplex ultrasound were performed to evaluate the lower extremity deep venous systems from the level of the common femoral vein and including the common femoral, femoral, profunda femoral, popliteal and calf veins including the posterior tibial, peroneal and gastrocnemius veins when visible. The superficial great saphenous vein was also interrogated. Spectral Doppler was utilized to  evaluate flow at rest and with distal augmentation maneuvers in the common femoral, femoral and popliteal veins. COMPARISON:  None Available. FINDINGS: RIGHT LOWER EXTREMITY Common Femoral Vein: No evidence of thrombus. Normal compressibility, respiratory phasicity and response to augmentation. Saphenofemoral Junction: No evidence of thrombus. Normal compressibility and flow on color Doppler imaging. Profunda Femoral Vein: No evidence of thrombus. Normal compressibility and flow on color Doppler imaging. Femoral Vein: No evidence of thrombus. Normal compressibility, respiratory phasicity and response to augmentation. Popliteal Vein: No evidence of thrombus. Normal compressibility, respiratory phasicity and response to augmentation. Calf Veins: No evidence of thrombus. Normal compressibility and flow on color Doppler imaging. Superficial Great Saphenous Vein: No evidence of thrombus. Normal compressibility. Venous Reflux:  None. Other Findings:  None. LEFT LOWER EXTREMITY Common Femoral Vein: No evidence of thrombus. Normal compressibility, respiratory phasicity and response to augmentation.  Saphenofemoral Junction: No evidence of thrombus. Normal compressibility and flow on color Doppler imaging. Profunda Femoral Vein: No evidence of thrombus. Normal compressibility and flow on color Doppler imaging. Femoral Vein: No evidence of thrombus. Normal compressibility, respiratory phasicity and response to augmentation. Popliteal Vein: No evidence of thrombus. Normal compressibility, respiratory phasicity and response to augmentation. Calf Veins: No evidence of thrombus. Normal compressibility and flow on color Doppler imaging. Superficial Great Saphenous Vein: No evidence of thrombus. Normal compressibility. Venous Reflux:  None. Other Findings:  None. IMPRESSION: No evidence of deep venous thrombosis in either lower extremity. Electronically Signed   By: Fernando Hoyer M.D.   On: 09/21/2023 13:24   DG Chest 1 View Result Date: 09/21/2023 CLINICAL DATA:  Shortness of breath and chest pain. EXAM: CHEST  1 VIEW COMPARISON:  Chest CT yesterday FINDINGS: Stable cardiomegaly. Aortic tortuosity. Left pleural effusion is unchanged allowing for differences in modality. The right pleural effusion on CT is not well seen by radiograph. No new airspace disease. No pulmonary edema. No pneumothorax. Scoliotic curvature of spine. IMPRESSION: 1. Unchanged left pleural effusion. The right pleural effusion on CT is not well seen by radiograph. 2. Stable cardiomegaly and aortic tortuosity. Electronically Signed   By: Chadwick Colonel M.D.   On: 09/21/2023 13:22     Assessment & Plan: Ms. Ajooni Karam is a 78 y.o.  female with past medical history of anemia,Osteoporosis, hypophosphatemia with rickets, chronic kidney disease stage III A, who was admitted to [facility] on 09/21/2023 for Abdominal distension [R14.0] Lower extremity edema [R60.0] Volume overload [E87.70] Dyspnea, unspecified type [R06.00] AKI (acute kidney injury) (HCC) [N17.9]  Acute kidney injury on chronic kidney disease stage IIIa.  Baseline  creatinine 1.16 with GFR 49 on 05/25/2023.  Acute kidney injury likely secondary to hypotension, and intravascular depletion due to aggressive diuresis. Also IV contrast exposure on 6/1. Renal ultrasound negative for obstruction. Agree with holding diuresis and avoiding IVF. Will order urinalysis and protein creatinine ratio. Will also order Ensure supplementation.  Avoid all other nephrotoxic agents, therapies and hypotension. No acute indication for dialysis. Will monitor during this admission.   2. Fluid volume overload, abd ultrasound shows moderate to large volume ascites. IR consulted for paracentesis, 3.5L achieved.   3. Anemia of chronic kidney disease Lab Results  Component Value Date   HGB 10.6 (L) 09/23/2023    Hgb within optimal range.   4. Secondary Hyperparathyroidism: with outpatient labs: PTH 81 on 05/25/23  Lab Results  Component Value Date   PTH 81 (H) 05/25/2023   CALCIUM  8.1 (L) 09/23/2023   PHOS 3.2 09/23/2023    Cholecalciferol ordered  outpatient.    LOS: 1 Shaundra Fullam 6/4/202511:15 AM

## 2023-09-23 NOTE — Plan of Care (Signed)
                                                     Palliative Care Progress Note   Patient Name: Felicia Robinson       Date: 09/23/2023 DOB: [birth year]  Age: 78 y.o. MRN#: [id number] Attending Physician: Brenna Cam, MD Primary Care Physician: Lemar Pyles, NP Admit Date: 09/21/2023  Extensive chart review completed including labs, vital signs, imaging, progress notes, orders, and available advanced directive documents from current and previous encounters.   In to see patient. Patient off of unit to ultrasound.  Counseled with attending Dr. Mason Sole in regards to patient condition and treatment plan.   I plan to f/u with patient at a later time today.   Thank you for allowing the Palliative Medicine Team to assist in the care of Felicia Robinson.  Felicia Nose L. Rebbeca Campi, DNP, FNP-BC Palliative Medicine Team    No charge

## 2023-09-24 ENCOUNTER — Encounter: Payer: Self-pay | Admitting: Nurse Practitioner

## 2023-09-24 DIAGNOSIS — R140 Abdominal distension (gaseous): Secondary | ICD-10-CM | POA: Diagnosis not present

## 2023-09-24 DIAGNOSIS — M898X9 Other specified disorders of bone, unspecified site: Secondary | ICD-10-CM | POA: Diagnosis not present

## 2023-09-24 DIAGNOSIS — D509 Iron deficiency anemia, unspecified: Secondary | ICD-10-CM

## 2023-09-24 DIAGNOSIS — R0600 Dyspnea, unspecified: Secondary | ICD-10-CM | POA: Diagnosis not present

## 2023-09-24 DIAGNOSIS — E8770 Fluid overload, unspecified: Secondary | ICD-10-CM | POA: Diagnosis not present

## 2023-09-24 DIAGNOSIS — N179 Acute kidney failure, unspecified: Secondary | ICD-10-CM | POA: Diagnosis not present

## 2023-09-24 DIAGNOSIS — E785 Hyperlipidemia, unspecified: Secondary | ICD-10-CM | POA: Diagnosis not present

## 2023-09-24 LAB — CBC WITH DIFFERENTIAL/PLATELET
Abs Immature Granulocytes: 0.06 10*3/uL (ref 0.00–0.07)
Basophils Absolute: 0 10*3/uL (ref 0.0–0.1)
Basophils Relative: 0 %
Eosinophils Absolute: 0.1 10*3/uL (ref 0.0–0.5)
Eosinophils Relative: 1 %
HCT: 32.7 % — ABNORMAL LOW (ref 36.0–46.0)
Hemoglobin: 10.3 g/dL — ABNORMAL LOW (ref 12.0–15.0)
Immature Granulocytes: 1 %
Lymphocytes Relative: 11 %
Lymphs Abs: 1.2 10*3/uL (ref 0.7–4.0)
MCH: 28.2 pg (ref 26.0–34.0)
MCHC: 31.5 g/dL (ref 30.0–36.0)
MCV: 89.6 fL (ref 80.0–100.0)
Monocytes Absolute: 0.9 10*3/uL (ref 0.1–1.0)
Monocytes Relative: 9 %
Neutro Abs: 8.2 10*3/uL — ABNORMAL HIGH (ref 1.7–7.7)
Neutrophils Relative %: 78 %
Platelets: 324 10*3/uL (ref 150–400)
RBC: 3.65 MIL/uL — ABNORMAL LOW (ref 3.87–5.11)
RDW: 13.3 % (ref 11.5–15.5)
WBC: 10.4 10*3/uL (ref 4.0–10.5)
nRBC: 0 % (ref 0.0–0.2)

## 2023-09-24 LAB — COMPREHENSIVE METABOLIC PANEL WITH GFR
ALT: 13 U/L (ref 0–44)
AST: 21 U/L (ref 15–41)
Albumin: 2.1 g/dL — ABNORMAL LOW (ref 3.5–5.0)
Alkaline Phosphatase: 100 U/L (ref 38–126)
Anion gap: 8 (ref 5–15)
BUN: 41 mg/dL — ABNORMAL HIGH (ref 8–23)
CO2: 18 mmol/L — ABNORMAL LOW (ref 22–32)
Calcium: 7.7 mg/dL — ABNORMAL LOW (ref 8.9–10.3)
Chloride: 108 mmol/L (ref 98–111)
Creatinine, Ser: 2.85 mg/dL — ABNORMAL HIGH (ref 0.44–1.00)
GFR, Estimated: 17 mL/min — ABNORMAL LOW (ref >60)
Glucose, Bld: 96 mg/dL (ref 70–99)
Potassium: 3.8 mmol/L (ref 3.5–5.1)
Sodium: 134 mmol/L — ABNORMAL LOW (ref 135–145)
Total Bilirubin: 0.7 mg/dL (ref 0.0–1.2)
Total Protein: 5 g/dL — ABNORMAL LOW (ref 6.5–8.1)

## 2023-09-24 LAB — ACID FAST SMEAR (AFB, MYCOBACTERIA): Acid Fast Smear: NEGATIVE

## 2023-09-24 LAB — PHOSPHORUS: Phosphorus: 3.1 mg/dL (ref 2.5–4.6)

## 2023-09-24 LAB — MAGNESIUM: Magnesium: 2 mg/dL (ref 1.7–2.4)

## 2023-09-24 LAB — CYTOLOGY - NON PAP

## 2023-09-24 MED ORDER — MIDODRINE HCL 5 MG PO TABS
5.0000 mg | ORAL_TABLET | Freq: Three times a day (TID) | ORAL | Status: DC
  Administered 2023-09-24 – 2023-09-25 (×3): 5 mg via ORAL
  Filled 2023-09-24 (×3): qty 1

## 2023-09-24 NOTE — Plan of Care (Signed)
  Problem: Education: Goal: Knowledge of General Education information will improve Description: Including pain rating scale, medication(s)/side effects and non-pharmacologic comfort measures Outcome: Progressing   Problem: Health Behavior/Discharge Planning: Goal: Ability to manage health-related needs will improve Outcome: Progressing   Problem: Clinical Measurements: Goal: Ability to maintain clinical measurements within normal limits will improve Outcome: Progressing Goal: Will remain free from infection Outcome: Progressing Goal: Diagnostic test results will improve Outcome: Progressing   Problem: Nutrition: Goal: Adequate nutrition will be maintained Outcome: Progressing   

## 2023-09-24 NOTE — TOC CM/SW Note (Signed)
 Transition of Care [facility]) - Inpatient Brief Assessment   Patient Details  Name: Felicia Robinson MRN: [id number] Date of Birth: March 10, 1946  [facility] of [facility] CM/SW Contact:    Loman Risk, RN Phone Number: 09/24/2023, 12:32 PM   Clinical Narrative:  S/p paracentesis Patient was recently discharged 6/1, and I met with her at that time and made referral to [facility] outpatient PT  Admitted for: SOB Admitted from: home alone.  When son is in town he stays with her PCP: [place] Current home health/prior home health/DME: NA     Transition of Care Asessment: Insurance and Status: Insurance coverage has been reviewed Patient has primary care physician: Yes     Prior/Current Home Services: No current home services Social Drivers of Health Review: SDOH reviewed no interventions necessary Readmission risk has been reviewed: Yes Transition of care needs: no transition of care needs at this time

## 2023-09-24 NOTE — Progress Notes (Addendum)
 PROGRESS NOTE    Felicia Robinson  JXB:[id number] DOB: 03/11/1946 DOA: 09/21/2023 PCP: Lemar Pyles, NP  Chief Complaint  Patient presents with   Shortness of Breath    Hospital Course:  Felicia Robinson is a 78 y.o. female with medical history significant of stage IIIa CKD, X-linked hypophosphatemic rickets follows with [facility], osteoporosis, anemia, B12 deficiency, grief reaction of sertraline , mixed hyperlipidemia, vitamin D  deficiency, history of breast cancer, hypertension. Patient was admitted to the hospital 5/30 through 6/1 with prerenal dehydration induced AKI secondary to nausea, vomiting, diarrhea.  While admitted she received 24 hours of IV fluids.  At time of discharge kidney function was gradually improving and patient had improved to her physiologic baseline. When patient returned home she noted increased swelling in her lower extremities and at PCP follow-up today she was found to be overtly volume overloaded.  It was recommended she return to the ER. Of note patient has recently diagnosed lytic lesions.  Workup is ongoing at this time.  Multiple myeloma panel has been sent and is still pending.  Patient has outpatient follow-up with oncology and scheduled PET scan tomorrow.   On arrival to the ED this admission patient is found to have 1+ pitting edema in her lower extremities, and abdominal distention.  She reports she is currently 13 pounds above her normal weight.  Labs revealed BNP 187, WBC 12.9, creatinine 1.7.  Chest x-ray with small left pleural effusion, mild cardiomegaly.  Lower extremity Dopplers are negative for DVT.  Patient is stable on room air but endorses abdominal discomfort due to the swelling.  She denies any chest pain.  She has had no fever.    6/4: Palliative care and nephrology consult.  Ultrasound-guided paracentesis with 3.5 L of fluid removal 6/5: PT and OT eval, discontinue telemetry, stopping/holding aspirin as she had/maroon  stool.  Subjective:  She is sleepy as she has not had much rest for last 2 to 3 days due to Ascites.  Now with fluid removed she is feeling much relieved and able to breathe and relax   Objective: Vitals:   09/24/23 0400 09/24/23 0826 09/24/23 1054 09/24/23 1511  BP: 121/62 117/75 (!) 99/58 109/72  Pulse: 80 95 98 90  Resp:  16 17 19   Temp: 97.8 F (36.6 C) 98.6 F (37 C) 99 F (37.2 C) 99.3 F (37.4 C)  TempSrc: Oral Oral    SpO2: 96% 94% 95% 96%  Weight:      Height:        Intake/Output Summary (Last 24 hours) at 09/24/2023 1551 Last data filed at 09/24/2023 0557 Gross per 24 hour  Intake --  Output 200 ml  Net -200 ml   Filed Weights   09/21/23 1212  Weight: 58.7 kg    Examination: General exam: Appears calm and comfortable, NAD  Respiratory system: No work of breathing, symmetric chest wall expansion Cardiovascular system: S1 & S2 heard, RRR.  Gastrointestinal system: Abdomen is soft, benign Neuro: Alert and oriented. No focal neurological deficits. Extremities: Symmetric, expected ROM.  Pitting edema in lower extremities improved from prior exams Skin: No rashes, lesions Psychiatry: Demonstrates appropriate judgement and insight. Mood & affect appropriate for situation.   Assessment & Plan:  Principal Problem:   Volume overload Active Problems:   AKI (acute kidney injury) (HCC) Volume overload on admission - now volume contracted s/p diuresis - echo shows normal function - Small pleural effusion on CXR, stable since prior admission  AKI on CKD 3a -  Recently admitted with prerenal injury secondary to vomiting, nausea, anorexia.  Creatinine down trended to 1.23 on 6/1, up trended to 1.7 at admission.  Patient did receive CT contrast on 6/1 prior to discharge - Creatinine continues to rise, BUN & creat likely due to contrast exposure and Diuresis - Hold Diuresis - Nephro following Lab Results  Component Value Date   CREATININE 2.85 (H) 09/24/2023    CREATININE 2.61 (H) 09/23/2023   CREATININE 2.35 (H) 09/22/2023     Ascites - s/p ultrasound guided paracentesis with removal of 2.5 L of fluid - fluid seems exudative in nature  Diffuse lytic lesions - Presumed malignancy,  unable to find primary source at this time.  Lytic lesions difficult to truly characterize in setting of profound osteopenia. - - No heart failure and normal liver function.  Cell lines mostly stable, do not suspect the bone marrow biopsy would be immediately helpful.  Fluid density lesion of the right gluteal musculature appears to have simple fluid collection, not likely malignant per radiology review. -CT abdomen pelvis 5/25: Chronic 6 mm hepatic lobe lesion, stable in size.  3 x 2.5 cm well-circumscribed fluid density lesion above the right gluteal musculature, small volume ascites containing umbilical hernia, small fat-containing right inguinal hernia.  Diffusely heterogenous axial and appendicular skeleton with multiple underlying lytic lesions, redemonstration of a chronic S3 sacral foramina fluid density lesion expanding diaphragm and is suggestive of a Tarlov cyst. - Patient has established follow-up with Dr. [facility].  - Multiple myeloma panel not suggestive of myeloma.  Cannot rule out nonsecretory myeloma per oncology - Peripheral smear review was unremarkable WBC morphology - Chest CT without primary lesion.  No significant adenopathy - Will get outpatient PET scan per oncology/radiology.  If any bone lesions are hypermetabolic she will need biopsy as an outpatient  Nausea, vomiting - Resolved at this time.  Has been chronic and intermittent for the last 3 weeks - Workup on prior admission reveals COVID/RSV/flu negative.  TSH and hemoglobin A1c unremarkable - She has been tolerating her diet for now -  continue with as needed Zofran    Chronic diarrhea - Patient endorses intermittent diarrhea for the last many months - GI PCR negative - Would benefit from  outpatient GI follow-up.  Leukocytosis - WBC 12.9 on arrival.  No infectious signs or symptoms - Was previously elevated on prior admission and downtrended without intervention.  May be reactive vs malignancy associated -Continue to trend CBC. - Urine culture 5/25 with 30,000 colonies of lactobacillus.  Patient completed course of Keflex.  Denies any urinary symptoms - Chest x-ray without infiltrates   Thrombocytosis - Platelets 437 on arrival, 472 on 5/30 which downtrended to normal without intervention. - Leukocytosis and thrombocytosis likely in some part related to underlying malignancy/multiple myeloma.  Workup as above.   History of breast cancer, 2013.  In remission since 2018. - Status post left mastectomy -- Has had annual mammograms without recurrence. Most recent 10/2022 WNL   X-linked hypophosphatemia rickets - Follows with [facility] endocrinology - History of bilateral femoral stress fractures 2019, tibial fracture 2021 - Receives Cryvista injections monthly.  Scheduled for next dose this week. Holding for now.   B12 deficiency - Continue outpatient supplements at discharge   History of iron deficiency anemia - Continue with iron and B12 supplementation at discharge - Hemoglobin appears near baseline at this time - monitor CBC   Grief reaction - Continue sertraline  50mg  dose    Mixed hyperlipidemia - Continue statin  Hypertension - Pressure currently at goal without antihypertensives.  Will monitor.  Resume home meds as needed    Hypoalbuminemia Hypocalcemia - Correct electrolytes as needed - Trend daily CMP   Thyroid nodule - 1.7 cm right thyroid nodule seen on chest CT.  Outpatient follow-up for thyroid ultrasound - TSH within normal limits  Weakness PT and OT eval    DVT prophylaxis: Heparin    Code Status: Full Code Disposition:  Inpatient, work up on going.  Consultants:   - Onco Nephrology  Procedures:  US  Guided  Paracentesis  Antimicrobials:  Anti-infectives (From admission, onward)    None       Data Reviewed: I have personally reviewed following labs and imaging studies CBC: Recent Labs  Lab 09/20/23 0642 09/21/23 1229 09/21/23 1526 09/22/23 0506 09/23/23 0444 09/24/23 0439  WBC 9.1 12.9* 14.0* 9.7 10.9* 10.4  NEUTROABS 7.2 10.8*  --  7.6 8.6* 8.2*  HGB 10.2* 11.3* 11.4* 9.5* 10.6* 10.3*  HCT 32.0* 36.0 36.5 30.2* 33.3* 32.7*  MCV 88.2 90.7 90.6 89.1 89.5 89.6  PLT 388 437* 466* 357 369 324   Basic Metabolic Panel: Recent Labs  Lab 09/19/23 0700 09/20/23 0642 09/21/23 1229 09/21/23 1526 09/22/23 0506 09/22/23 1749 09/23/23 0444 09/24/23 0439  NA 135 135 135  --  136 134* 134* 134*  K 3.7 3.5 3.8  --  3.8 3.7 3.7 3.8  CL 108 109 106  --  107 107 106 108  CO2 19* 19* 18*  --  19* 16* 17* 18*  GLUCOSE 96 121* 143*  --  95 104* 106* 96  BUN 37* 27* 28*  --  31* 34* 36* 41*  CREATININE 1.74* 1.23* 1.77* 1.80* 1.92* 2.35* 2.61* 2.85*  CALCIUM  7.8* 7.9* 8.1*  --  7.9* 8.0* 8.1* 7.7*  MG 1.5* 2.5*  --   --  2.1  --  2.0 2.0  PHOS 2.3* 3.1  --   --  3.1  --  3.2 3.1   GFR: Estimated Creatinine Clearance: 11.1 mL/min (A) (by C-G formula based on SCr of 2.85 mg/dL (H)). Liver Function Tests: Recent Labs  Lab 09/21/23 1229 09/22/23 0506 09/22/23 1749 09/23/23 0444 09/24/23 0439  AST 23 18 20 21 21   ALT 13 12 13 13 13   ALKPHOS 120 100 112 116 100  BILITOT 0.5 0.7 0.6 0.7 0.7  PROT 6.1* 5.1* 5.5* 5.6* 5.0*  ALBUMIN 2.7* 2.3* 2.5* 2.5* 2.1*   CBG: No results for input(s): "GLUCAP" in the last 168 hours.  Recent Results (from the past 240 hours)  Resp panel by RT-PCR (RSV, Flu A&B, Covid) Anterior Nasal Swab     Status: None   Collection Time: 09/18/23  8:02 PM   Specimen: Anterior Nasal Swab  Result Value Ref Range Status   SARS Coronavirus 2 by RT PCR NEGATIVE NEGATIVE Final    Comment: (NOTE) SARS-CoV-2 target nucleic acids are NOT DETECTED.  The SARS-CoV-2  RNA is generally detectable in upper respiratory specimens during the acute phase of infection. The lowest concentration of SARS-CoV-2 viral copies this assay can detect is 138 copies/mL. A negative result does not preclude SARS-Cov-2 infection and should not be used as the sole basis for treatment or other patient management decisions. A negative result may occur with  improper specimen collection/handling, submission of specimen other than nasopharyngeal swab, presence of viral mutation(s) within the areas targeted by this assay, and inadequate number of viral copies(<138 copies/mL). A negative result must be combined with clinical  observations, patient history, and epidemiological information. The expected result is Negative.  Fact Sheet for Patients:  BloggerCourse.com  Fact Sheet for Healthcare Providers:  SeriousBroker.it  This test is no t yet approved or cleared by the [place]  FDA and  has been authorized for detection and/or diagnosis of SARS-CoV-2 by FDA under an Emergency Use Authorization (EUA). This EUA will remain  in effect (meaning this test can be used) for the duration of the COVID-19 declaration under Section 564(b)(1) of the Act, 21 U.S.C.section 360bbb-3(b)(1), unless the authorization is terminated  or revoked sooner.       Influenza A by PCR NEGATIVE NEGATIVE Final   Influenza B by PCR NEGATIVE NEGATIVE Final    Comment: (NOTE) The Xpert Xpress SARS-CoV-2/FLU/RSV plus assay is intended as an aid in the diagnosis of influenza from Nasopharyngeal swab specimens and should not be used as a sole basis for treatment. Nasal washings and aspirates are unacceptable for Xpert Xpress SARS-CoV-2/FLU/RSV testing.  Fact Sheet for Patients: BloggerCourse.com  Fact Sheet for Healthcare Providers: SeriousBroker.it  This test is not yet approved or cleared by the  [place]  FDA and has been authorized for detection and/or diagnosis of SARS-CoV-2 by FDA under an Emergency Use Authorization (EUA). This EUA will remain in effect (meaning this test can be used) for the duration of the COVID-19 declaration under Section 564(b)(1) of the Act, 21 U.S.C. section 360bbb-3(b)(1), unless the authorization is terminated or revoked.     Resp Syncytial Virus by PCR NEGATIVE NEGATIVE Final    Comment: (NOTE) Fact Sheet for Patients: BloggerCourse.com  Fact Sheet for Healthcare Providers: SeriousBroker.it  This test is not yet approved or cleared by the [place]  FDA and has been authorized for detection and/or diagnosis of SARS-CoV-2 by FDA under an Emergency Use Authorization (EUA). This EUA will remain in effect (meaning this test can be used) for the duration of the COVID-19 declaration under Section 564(b)(1) of the Act, 21 U.S.C. section 360bbb-3(b)(1), unless the authorization is terminated or revoked.  Performed at [facility], [address] [place], [place] [address]   Gastrointestinal Panel by PCR , Stool     Status: None   Collection Time: 09/20/23  4:34 AM   Specimen: Stool  Result Value Ref Range Status   Campylobacter species NOT DETECTED NOT DETECTED Final   Plesimonas shigelloides NOT DETECTED NOT DETECTED Final   Salmonella species NOT DETECTED NOT DETECTED Final   Yersinia enterocolitica NOT DETECTED NOT DETECTED Final   Vibrio species NOT DETECTED NOT DETECTED Final   Vibrio cholerae NOT DETECTED NOT DETECTED Final   Enteroaggregative E coli (EAEC) NOT DETECTED NOT DETECTED Final   Enteropathogenic E coli (EPEC) NOT DETECTED NOT DETECTED Final   Enterotoxigenic E coli (ETEC) NOT DETECTED NOT DETECTED Final   Shiga like toxin producing E coli (STEC) NOT DETECTED NOT DETECTED Final   Shigella/Enteroinvasive E coli (EIEC) NOT DETECTED NOT DETECTED Final    Cryptosporidium NOT DETECTED NOT DETECTED Final   Cyclospora cayetanensis NOT DETECTED NOT DETECTED Final   Entamoeba histolytica NOT DETECTED NOT DETECTED Final   Giardia lamblia NOT DETECTED NOT DETECTED Final   Adenovirus F40/41 NOT DETECTED NOT DETECTED Final   Astrovirus NOT DETECTED NOT DETECTED Final   Norovirus GI/GII NOT DETECTED NOT DETECTED Final   Rotavirus A NOT DETECTED NOT DETECTED Final   Sapovirus (I, II, IV, and V) NOT DETECTED NOT DETECTED Final    Comment: Performed at [facility], [address]  [place], [place] [address]  Acid Fast Smear (AFB)     Status: None   Collection Time: 09/23/23 10:32 AM   Specimen: PATH Cytology Peritoneal fluid  Result Value Ref Range Status   AFB Specimen Processing Concentration  Final   Acid Fast Smear Negative  Final    Comment: (NOTE) Performed At: [facility] [address] [place], [place] [id number] Pearlean Botts MD XB:[id number]    Source (AFB) CYTO PERI  Final    Comment: Performed at [facility], [address] [place], [place] [address]  Anaerobic culture w Gram Stain     Status: None (Preliminary result)   Collection Time: 09/23/23 10:32 AM   Specimen: Fluid  Result Value Ref Range Status   Specimen Description FLUID PERITONEAL  Final   Special Requests NONE  Final   Gram Stain   Final    FEW WBC PRESENT,BOTH PMN AND MONONUCLEAR NO ORGANISMS SEEN Performed at [facility], [address] [place], [place] [address]    Culture PENDING  Incomplete   Report Status PENDING  Incomplete  Body fluid culture w Gram Stain     Status: None (Preliminary result)   Collection Time: 09/23/23 10:47 AM   Specimen: PATH Cytology Peritoneal fluid  Result Value Ref Range Status   Specimen Description   Final    FLUID Performed at [facility], [address] [place], [place] [address]    Special Requests PERITONEAL  Final   Gram Stain   Final    FEW WBC PRESENT,BOTH PMN AND  MONONUCLEAR NO ORGANISMS SEEN    Culture   Final    NO GROWTH < 12 HOURS Performed at [facility], [address], [place], [place] [address]    Report Status PENDING  Incomplete     Radiology Studies: US  Paracentesis Result Date: 09/23/2023 INDICATION: 78 year old female with a history of CKD IIIa, breast cancer, and currently undergoing workup for multiple myeloma. She presented to the ED due to lower extremity swelling and volume overload. Request for diagnostic and therapeutic paracentesis. EXAM: ULTRASOUND GUIDED right PARACENTESIS MEDICATIONS: 1% lidocaine, 7 mL COMPLICATIONS: None immediate. PROCEDURE: Informed written consent was obtained from the patient after a discussion of the risks, benefits and alternatives to treatment. A timeout was performed prior to the initiation of the procedure. Initial ultrasound scanning demonstrates a large amount of ascites within the right lower abdominal quadrant. The right lower abdomen was prepped and draped in the usual sterile fashion. 1% lidocaine was used for local anesthesia. Following this, a 19 gauge, 7-cm, Yueh catheter was introduced. An ultrasound image was saved for documentation purposes. The paracentesis was performed. The catheter was removed and a dressing was applied. The patient tolerated the procedure well without immediate post procedural complication. FINDINGS: A total of approximately 3.5 L of amber fluid was removed. Samples were sent to the laboratory as requested by the clinical team. IMPRESSION: Successful ultrasound-guided paracentesis yielding 3.5 liters of peritoneal fluid. Procedure performed by: Estella Helling, PA-C under the supervision of Dr. Baldemar Lev Electronically Signed   By: Fernando Hoyer M.D.   On: 09/23/2023 15:40   ECHOCARDIOGRAM COMPLETE Result Date: 09/23/2023    ECHOCARDIOGRAM REPORT   Patient Name:   BRADLEE BRIDGERS [facility] Date of Exam: 09/22/2023 Medical Rec #:  [id number]           Height:       54.0 in  Accession #:    [id number]  Weight:       129.4 lb Date of Birth:  1945-10-20            BSA:          1.437 m Patient Age:    78 years            BP:           108/94 mmHg Patient Gender: F                   HR:           94 bpm. Exam Location:  [facility] Procedure: 2D Echo, Cardiac Doppler and Color Doppler (Both Spectral and Color            Flow Doppler were utilized during procedure). Indications:     I50.31 Acute Diastolic CHF  History:         Patient has no prior history of Echocardiogram examinations.                  Risk Factors:Hypertension. History of Breast Cancer.  Sonographer:     Brigid Canada RDCS Referring Phys:  [id number] ALEXANDRA DEZII Diagnosing Phys: Sammy Crisp MD IMPRESSIONS  1. Left ventricular ejection fraction, by estimation, is 70 to 75%. The left ventricle has hyperdynamic function. The left ventricle has no regional wall motion abnormalities. Left ventricular diastolic parameters were normal.  2. Right ventricular systolic function is normal. The right ventricular size is mildly enlarged. Tricuspid regurgitation signal is inadequate for assessing PA pressure.  3. The mitral valve is normal in structure. No evidence of mitral valve regurgitation. No evidence of mitral stenosis.  4. The aortic valve is tricuspid. There is mild thickening of the aortic valve. Aortic valve regurgitation is trivial. Aortic valve sclerosis is present, with no evidence of aortic valve stenosis. FINDINGS  Left Ventricle: Left ventricular ejection fraction, by estimation, is 70 to 75%. The left ventricle has hyperdynamic function. The left ventricle has no regional wall motion abnormalities. The left ventricular internal cavity size was normal in size. There is no left ventricular hypertrophy. Left ventricular diastolic parameters were normal. Right Ventricle: The right ventricular size is mildly enlarged. No increase in right ventricular wall thickness. Right ventricular systolic function is  normal. Tricuspid regurgitation signal is inadequate for assessing PA pressure. Left Atrium: Left atrial size was normal in size. Right Atrium: Right atrial size was normal in size. Pericardium: Trivial pericardial effusion is present. Mitral Valve: The mitral valve is normal in structure. No evidence of mitral valve regurgitation. No evidence of mitral valve stenosis. Tricuspid Valve: The tricuspid valve is not well visualized. Tricuspid valve regurgitation is trivial. Aortic Valve: The aortic valve is tricuspid. There is mild thickening of the aortic valve. Aortic valve regurgitation is trivial. Aortic valve sclerosis is present, with no evidence of aortic valve stenosis. Aortic valve mean gradient measures 6.5 mmHg. Aortic valve peak gradient measures 12.5 mmHg. Pulmonic Valve: The pulmonic valve was not well visualized. Pulmonic valve regurgitation is trivial. No evidence of pulmonic stenosis. Aorta: The aortic root is normal in size and structure. Pulmonary Artery: The pulmonary artery is of normal size. Venous: The inferior vena cava was not well visualized. IAS/Shunts: The interatrial septum was not well visualized.  LEFT VENTRICLE PLAX 2D LVIDd:         4.60 cm Diastology LVIDs:         2.40 cm LV e' medial:    7.89 cm/s LV PW:  0.80 cm LV E/e' medial:  6.2 LV IVS:        0.80 cm LV e' lateral:   12.20 cm/s                        LV E/e' lateral: 4.0  RIGHT VENTRICLE RV Basal diam:  3.70 cm RV S prime:     34.22 cm/s TAPSE (M-mode): 3.0 cm LEFT ATRIUM             Index        RIGHT ATRIUM           Index LA diam:        4.40 cm 3.06 cm/m   RA Area:     12.10 cm LA Vol (A2C):   24.2 ml 16.83 ml/m  RA Volume:   24.20 ml  16.83 ml/m LA Vol (A4C):   25.7 ml 17.88 ml/m LA Biplane Vol: 25.7 ml 17.88 ml/m  AORTIC VALVE AV Vmax:           176.46 cm/s AV Vmean:          120.312 cm/s AV VTI:            0.297 m AV Peak Grad:      12.5 mmHg AV Mean Grad:      6.5 mmHg LVOT Vmax:         163.67 cm/s LVOT  Vmean:        109.667 cm/s LVOT VTI:          0.259 m LVOT/AV VTI ratio: 0.87  AORTA Ao Root diam: 3.30 cm MITRAL VALVE MV Area (PHT): 4.40 cm    SHUNTS MV Decel Time: 173 msec    Systemic VTI: 0.26 m MV E velocity: 48.80 cm/s MV A velocity: 93.40 cm/s MV E/A ratio:  0.52 Veryl Gottron End MD Electronically signed by Sammy Crisp MD Signature Date/Time: 09/23/2023/7:14:12 AM    Final    US  ASCITES (ABDOMEN LIMITED) Result Date: 09/22/2023 CLINICAL DATA:  Ascites. EXAM: LIMITED ABDOMEN ULTRASOUND FOR ASCITES TECHNIQUE: Limited ultrasound survey for ascites was performed in all four abdominal quadrants. COMPARISON:  CT 09/13/2023 FINDINGS: Moderate to large volume of ascites, greatest in the pelvis and left upper quadrant. IMPRESSION: Moderate to large volume abdominopelvic ascites. Electronically Signed   By: Chadwick Colonel M.D.   On: 09/22/2023 19:10   US  RENAL Result Date: 09/22/2023 CLINICAL DATA:  Acute kidney injury EXAM: RENAL / URINARY TRACT ULTRASOUND COMPLETE COMPARISON:  None Available. FINDINGS: Right Kidney: Renal measurements: 9.3 x 4.1 x 4.6 cm = volume: 90 mL. Echogenicity is increased. No mass or hydronephrosis visualized. Left Kidney: Renal measurements: 8.1 x 4.1 x 2.7 cm = volume: 43 mL. Echogenicity is increased. No mass or hydronephrosis visualized. Bladder: Nondistended. Other: None. IMPRESSION: 1. No hydronephrosis. 2. Increased echogenicity of the kidneys bilaterally consistent with medical renal disease. Electronically Signed   By: Tyron Gallon M.D.   On: 09/22/2023 19:10    Scheduled Meds:  aspirin  81 mg Oral Daily   feeding supplement  237 mL Oral BID BM   ferrous sulfate  325 mg Oral Q breakfast   heparin   5,000 Units Subcutaneous Q8H   rosuvastatin   10 mg Oral Daily   sertraline   75 mg Oral Daily   Time Spent: 35 mins  Son Rodman Clam updated over phone on 6/5   LOS: 2 days  MDM: Patient is high risk for one or more organ failure.  They  necessitate ongoing hospitalization  for continued IV therapies and subsequent lab monitoring. Total time spent interpreting labs and vitals, reviewing the medical record, coordinating care amongst consultants and care team members, directly assessing and discussing care with the patient and/or family: 55 min  Rudolph Dobler Mason Sole, MD [facility]  To contact the attending physician between 7A-7P please use Epic Chat. To contact the covering physician during after hours 7P-7A, please review Amion.  09/24/2023, 3:51 PM   *This document has been created with the assistance of dictation software. Please excuse typographical errors. *

## 2023-09-24 NOTE — Progress Notes (Signed)
 [facility]  ROUNDING NOTE   Subjective:   Patient seen ambulating in the room Alert and oriented Reports appetite is improving Denies nausea or vomiting Remains on room air Reports respiratory status has improved with removal of fluid  Objective:  Vital signs in last 24 hours:  Temp:  [97.8 F (36.6 C)-98.6 F (37 C)] 98.6 F (37 C) (06/05 0826) Pulse Rate:  [80-95] 95 (06/05 0826) Resp:  [15-16] 16 (06/05 0826) BP: (112-121)/(62-76) 117/75 (06/05 0826) SpO2:  [94 %-96 %] 94 % (06/05 0826)  Weight change:  Filed Weights   09/21/23 1212  Weight: 58.7 kg    Intake/Output: I/O last 3 completed shifts: In: 600 [P.O.:600] Out: 998 [Urine:998]   Intake/Output this shift:  No intake/output data recorded.  Physical Exam: General: NAD  Head: Normocephalic, atraumatic. Moist oral mucosal membranes  Eyes: Anicteric  Lungs:  Clear to auscultation, normal effort  Heart: Regular rate and rhythm  Abdomen:  Soft, nontender, mild distention  Extremities:  1+ peripheral edema.  Neurologic: Awake, alert, conversant  Skin: Warm,dry, no rash       Basic Metabolic Panel: Recent Labs  Lab 09/19/23 0700 09/20/23 0642 09/21/23 1229 09/21/23 1526 09/22/23 0506 09/22/23 1749 09/23/23 0444 09/24/23 0439  NA 135 135 135  --  136 134* 134* 134*  K 3.7 3.5 3.8  --  3.8 3.7 3.7 3.8  CL 108 109 106  --  107 107 106 108  CO2 19* 19* 18*  --  19* 16* 17* 18*  GLUCOSE 96 121* 143*  --  95 104* 106* 96  BUN 37* 27* 28*  --  31* 34* 36* 41*  CREATININE 1.74* 1.23* 1.77* 1.80* 1.92* 2.35* 2.61* 2.85*  CALCIUM  7.8* 7.9* 8.1*  --  7.9* 8.0* 8.1* 7.7*  MG 1.5* 2.5*  --   --  2.1  --  2.0 2.0  PHOS 2.3* 3.1  --   --  3.1  --  3.2 3.1    Liver Function Tests: Recent Labs  Lab 09/21/23 1229 09/22/23 0506 09/22/23 1749 09/23/23 0444 09/24/23 0439  AST 23 18 20 21 21   ALT 13 12 13 13 13   ALKPHOS 120 100 112 116 100  BILITOT 0.5 0.7 0.6 0.7 0.7  PROT 6.1* 5.1* 5.5*  5.6* 5.0*  ALBUMIN 2.7* 2.3* 2.5* 2.5* 2.1*   No results for input(s): "LIPASE", "AMYLASE" in the last 168 hours. No results for input(s): "AMMONIA" in the last 168 hours.  CBC: Recent Labs  Lab 09/20/23 0642 09/21/23 1229 09/21/23 1526 09/22/23 0506 09/23/23 0444 09/24/23 0439  WBC 9.1 12.9* 14.0* 9.7 10.9* 10.4  NEUTROABS 7.2 10.8*  --  7.6 8.6* 8.2*  HGB 10.2* 11.3* 11.4* 9.5* 10.6* 10.3*  HCT 32.0* 36.0 36.5 30.2* 33.3* 32.7*  MCV 88.2 90.7 90.6 89.1 89.5 89.6  PLT 388 437* 466* 357 369 324    Cardiac Enzymes: No results for input(s): "CKTOTAL", "CKMB", "CKMBINDEX", "TROPONINI" in the last 168 hours.  BNP: Invalid input(s): "POCBNP"  CBG: No results for input(s): "GLUCAP" in the last 168 hours.  Microbiology: Results for orders placed or performed during the hospital encounter of 09/21/23  Anaerobic culture w Gram Stain     Status: None (Preliminary result)   Collection Time: 09/23/23 10:32 AM   Specimen: Fluid  Result Value Ref Range Status   Specimen Description FLUID PERITONEAL  Final   Special Requests NONE  Final   Gram Stain   Final    FEW  WBC PRESENT,BOTH PMN AND MONONUCLEAR NO ORGANISMS SEEN Performed at [facility], [address], [place], [place] [address]    Culture PENDING  Incomplete   Report Status PENDING  Incomplete  Body fluid culture w Gram Stain     Status: None (Preliminary result)   Collection Time: 09/23/23 10:47 AM   Specimen: PATH Cytology Peritoneal fluid  Result Value Ref Range Status   Specimen Description   Final    FLUID Performed at [facility], [address] [place], [place] [address]    Special Requests PERITONEAL  Final   Gram Stain   Final    FEW WBC PRESENT,BOTH PMN AND MONONUCLEAR NO ORGANISMS SEEN    Culture   Final    NO GROWTH < 12 HOURS Performed at [facility], [address] [place], [place] [address]    Report Status PENDING  Incomplete    Coagulation Studies: Recent Labs     09/21/23 1229  LABPROT 15.2  INR 1.2    Urinalysis: Recent Labs    09/23/23 1500  COLORURINE YELLOW*  YELLOW*  LABSPEC 1.015  1.015  PHURINE 5.0  5.0  GLUCOSEU NEGATIVE  NEGATIVE  HGBUR SMALL*  SMALL*  BILIRUBINUR NEGATIVE  NEGATIVE  KETONESUR NEGATIVE  NEGATIVE  PROTEINUR NEGATIVE  NEGATIVE  NITRITE NEGATIVE  NEGATIVE  LEUKOCYTESUR TRACE*  SMALL*      Imaging: US  Paracentesis Result Date: 09/23/2023 INDICATION: 78 year old female with a history of CKD IIIa, breast cancer, and currently undergoing workup for multiple myeloma. She presented to the ED due to lower extremity swelling and volume overload. Request for diagnostic and therapeutic paracentesis. EXAM: ULTRASOUND GUIDED right PARACENTESIS MEDICATIONS: 1% lidocaine, 7 mL COMPLICATIONS: None immediate. PROCEDURE: Informed written consent was obtained from the patient after a discussion of the risks, benefits and alternatives to treatment. A timeout was performed prior to the initiation of the procedure. Initial ultrasound scanning demonstrates a large amount of ascites within the right lower abdominal quadrant. The right lower abdomen was prepped and draped in the usual sterile fashion. 1% lidocaine was used for local anesthesia. Following this, a 19 gauge, 7-cm, Yueh catheter was introduced. An ultrasound image was saved for documentation purposes. The paracentesis was performed. The catheter was removed and a dressing was applied. The patient tolerated the procedure well without immediate post procedural complication. FINDINGS: A total of approximately 3.5 L of amber fluid was removed. Samples were sent to the laboratory as requested by the clinical team. IMPRESSION: Successful ultrasound-guided paracentesis yielding 3.5 liters of peritoneal fluid. Procedure performed by: Estella Helling, PA-C under the supervision of Dr. Baldemar Lev Electronically Signed   By: Fernando Hoyer M.D.   On: 09/23/2023 15:40    ECHOCARDIOGRAM COMPLETE Result Date: 09/23/2023    ECHOCARDIOGRAM REPORT   Patient Name:   Felicia Robinson [facility] Date of Exam: 09/22/2023 Medical Rec #:  [id number]           Height:       54.0 in Accession #:    [id number]          Weight:       129.4 lb Date of Birth:  1946-03-08            BSA:          1.437 m Patient Age:    78 years            BP:           108/94 mmHg Patient  Gender: F                   HR:           94 bpm. Exam Location:  [facility] Procedure: 2D Echo, Cardiac Doppler and Color Doppler (Both Spectral and Color            Flow Doppler were utilized during procedure). Indications:     I50.31 Acute Diastolic CHF  History:         Patient has no prior history of Echocardiogram examinations.                  Risk Factors:Hypertension. History of Breast Cancer.  Sonographer:     Brigid Canada RDCS Referring Phys:  [id number] ALEXANDRA DEZII Diagnosing Phys: Sammy Crisp MD IMPRESSIONS  1. Left ventricular ejection fraction, by estimation, is 70 to 75%. The left ventricle has hyperdynamic function. The left ventricle has no regional wall motion abnormalities. Left ventricular diastolic parameters were normal.  2. Right ventricular systolic function is normal. The right ventricular size is mildly enlarged. Tricuspid regurgitation signal is inadequate for assessing PA pressure.  3. The mitral valve is normal in structure. No evidence of mitral valve regurgitation. No evidence of mitral stenosis.  4. The aortic valve is tricuspid. There is mild thickening of the aortic valve. Aortic valve regurgitation is trivial. Aortic valve sclerosis is present, with no evidence of aortic valve stenosis. FINDINGS  Left Ventricle: Left ventricular ejection fraction, by estimation, is 70 to 75%. The left ventricle has hyperdynamic function. The left ventricle has no regional wall motion abnormalities. The left ventricular internal cavity size was normal in size. There is no left ventricular hypertrophy. Left  ventricular diastolic parameters were normal. Right Ventricle: The right ventricular size is mildly enlarged. No increase in right ventricular wall thickness. Right ventricular systolic function is normal. Tricuspid regurgitation signal is inadequate for assessing PA pressure. Left Atrium: Left atrial size was normal in size. Right Atrium: Right atrial size was normal in size. Pericardium: Trivial pericardial effusion is present. Mitral Valve: The mitral valve is normal in structure. No evidence of mitral valve regurgitation. No evidence of mitral valve stenosis. Tricuspid Valve: The tricuspid valve is not well visualized. Tricuspid valve regurgitation is trivial. Aortic Valve: The aortic valve is tricuspid. There is mild thickening of the aortic valve. Aortic valve regurgitation is trivial. Aortic valve sclerosis is present, with no evidence of aortic valve stenosis. Aortic valve mean gradient measures 6.5 mmHg. Aortic valve peak gradient measures 12.5 mmHg. Pulmonic Valve: The pulmonic valve was not well visualized. Pulmonic valve regurgitation is trivial. No evidence of pulmonic stenosis. Aorta: The aortic root is normal in size and structure. Pulmonary Artery: The pulmonary artery is of normal size. Venous: The inferior vena cava was not well visualized. IAS/Shunts: The interatrial septum was not well visualized.  LEFT VENTRICLE PLAX 2D LVIDd:         4.60 cm Diastology LVIDs:         2.40 cm LV e' medial:    7.89 cm/s LV PW:         0.80 cm LV E/e' medial:  6.2 LV IVS:        0.80 cm LV e' lateral:   12.20 cm/s                        LV E/e' lateral: 4.0  RIGHT VENTRICLE RV Basal diam:  3.70 cm RV S prime:  34.22 cm/s TAPSE (M-mode): 3.0 cm LEFT ATRIUM             Index        RIGHT ATRIUM           Index LA diam:        4.40 cm 3.06 cm/m   RA Area:     12.10 cm LA Vol (A2C):   24.2 ml 16.83 ml/m  RA Volume:   24.20 ml  16.83 ml/m LA Vol (A4C):   25.7 ml 17.88 ml/m LA Biplane Vol: 25.7 ml 17.88 ml/m   AORTIC VALVE AV Vmax:           176.46 cm/s AV Vmean:          120.312 cm/s AV VTI:            0.297 m AV Peak Grad:      12.5 mmHg AV Mean Grad:      6.5 mmHg LVOT Vmax:         163.67 cm/s LVOT Vmean:        109.667 cm/s LVOT VTI:          0.259 m LVOT/AV VTI ratio: 0.87  AORTA Ao Root diam: 3.30 cm MITRAL VALVE MV Area (PHT): 4.40 cm    SHUNTS MV Decel Time: 173 msec    Systemic VTI: 0.26 m MV E velocity: 48.80 cm/s MV A velocity: 93.40 cm/s MV E/A ratio:  0.52 Veryl Gottron End MD Electronically signed by Sammy Crisp MD Signature Date/Time: 09/23/2023/7:14:12 AM    Final    US  ASCITES (ABDOMEN LIMITED) Result Date: 09/22/2023 CLINICAL DATA:  Ascites. EXAM: LIMITED ABDOMEN ULTRASOUND FOR ASCITES TECHNIQUE: Limited ultrasound survey for ascites was performed in all four abdominal quadrants. COMPARISON:  CT 09/13/2023 FINDINGS: Moderate to large volume of ascites, greatest in the pelvis and left upper quadrant. IMPRESSION: Moderate to large volume abdominopelvic ascites. Electronically Signed   By: Chadwick Colonel M.D.   On: 09/22/2023 19:10   US  RENAL Result Date: 09/22/2023 CLINICAL DATA:  Acute kidney injury EXAM: RENAL / URINARY TRACT ULTRASOUND COMPLETE COMPARISON:  None Available. FINDINGS: Right Kidney: Renal measurements: 9.3 x 4.1 x 4.6 cm = volume: 90 mL. Echogenicity is increased. No mass or hydronephrosis visualized. Left Kidney: Renal measurements: 8.1 x 4.1 x 2.7 cm = volume: 43 mL. Echogenicity is increased. No mass or hydronephrosis visualized. Bladder: Nondistended. Other: None. IMPRESSION: 1. No hydronephrosis. 2. Increased echogenicity of the kidneys bilaterally consistent with medical renal disease. Electronically Signed   By: Tyron Gallon M.D.   On: 09/22/2023 19:10     Medications:     aspirin  81 mg Oral Daily   feeding supplement  237 mL Oral BID BM   ferrous sulfate  325 mg Oral Q breakfast   heparin   5,000 Units Subcutaneous Q8H   rosuvastatin   10 mg Oral Daily    sertraline   75 mg Oral Daily   acetaminophen , ipratropium-albuterol, Muscle Rub, sodium chloride   Assessment/ Plan:  Ms. Shelbylynn Walczyk is a 78 y.o.  female with past medical history of anemia,Osteoporosis, hypophosphatemia with rickets, chronic kidney disease stage III A, who was admitted to [facility] on 09/21/2023 for Abdominal distension [R14.0] Lower extremity edema [R60.0] Volume overload [E87.70] Dyspnea, unspecified type [R06.00] AKI (acute kidney injury) (HCC) [N17.9]   Acute kidney injury on chronic kidney disease stage IIIa.  Baseline creatinine 1.16 with GFR 49 on 05/25/2023.  Acute kidney injury likely secondary to hypotension, and intravascular depletion due  to aggressive diuresis. Also IV contrast exposure on 6/1. Renal ultrasound negative for obstruction. Agree with holding diuresis and avoiding IVF.  Urinalysis appears hazy but unremarkable.  Protein creatinine ratio 0.11.  Creatinine remains slightly elevated today.  Not overtly concerned at this time.  Will continue to monitor renal indices.  Continue to avoid nephrotoxic agents and therapies.  No acute indication for dialysis.  Lab Results  Component Value Date   CREATININE 2.85 (H) 09/24/2023   CREATININE 2.61 (H) 09/23/2023   CREATININE 2.35 (H) 09/22/2023    Intake/Output Summary (Last 24 hours) at 09/24/2023 1050 Last data filed at 09/24/2023 0557 Gross per 24 hour  Intake 360 ml  Output 300 ml  Net 60 ml    2. Fluid volume overload, abd ultrasound shows moderate to large volume ascites. IR consulted for paracentesis, 3.5L achieved.  Patient reports improvements in respiratory status.  3. Anemia of chronic kidney disease Lab Results  Component Value Date   HGB 10.3 (L) 09/24/2023    Hemoglobin remains acceptable.  4. Secondary Hyperparathyroidism: with outpatient labs: PTH 81 on 05/25/23  Lab Results  Component Value Date   PTH 81 (H) 05/25/2023   CALCIUM  7.7 (L) 09/24/2023   PHOS 3.1 09/24/2023     Cholecalciferol ordered outpatient.  Corrected calcium  of 9.2.   LOS: 2 Shontae Rosiles 6/5/202510:50 AM

## 2023-09-24 NOTE — Progress Notes (Signed)
 Palliative Care Progress Note, Assessment & Plan   Patient Name: Felicia Robinson       Date: 09/24/2023 DOB: February 13, 1946  Age: 78 y.o. MRN#: [id number] Attending Physician: Brenna Cam, MD Primary Care Physician: Lemar Pyles, NP Admit Date: 09/21/2023  Subjective: Patient is lying in bed, sleeping, but easily awakens to my presence.  Once awake, she is fully alert and oriented.  No family or friends present at bedside during my visit.  HPI: 78 y.o. female  with past medical history of stage IIIa CKD, X-linked hypophosphatemic rickets follows with [facility], osteoporosis, anemia, B12 deficiency, grief reaction of sertraline , mixed hyperlipidemia, vitamin D  deficiency, history of breast cancer, hypertension, recent hospitalization (5/30 - 6/1) with prerenal dehydration induced AKI secondary to nausea/vomiting/diarrhea.  She was admitted for 24 hours, given IV fluids, and discharged with kidney function gradually improving.   Patient followed up with her PCP after discharge due to increased swelling in her lower extremities.  Patient was overtly volume overloaded and advised to return to the ED.   Of note, patient was recently diagnosed with Lydick lesions, multiple myeloma panel has been sent and is pending, and had plans to follow-up outpatient with oncology with scheduled PET scan.   Patient admitted on 09/21/2023 with +1 pitting edema in her lower extremities and abdominal distention.   6/4-patient had paracentesis with 3.5 L removed.   PMT was consulted to support patient with goals of care discussions.   Summary of counseling/coordination of care: Extensive chart review completed prior to meeting patient including labs, vital signs, imaging, progress notes, orders, and available advanced directive  documents from current and previous encounters.   After reviewing the patient's chart and assessing the patient at bedside, I spoke with patient in regards to symptom management and goals of care.   Patient endorses she feels well today.  She denies nausea, vomiting, constipation, or other abdominal issues.  She endorses she remains somewhat tired but feels better than she did yesterday.  She is looking forward to going home soon.  No acute issues at this time.  No adjustment to [facility] needed.  Patient continues to endorse that she would like to accept cardiopulmonary resuscitation in the event of a cardiopulmonary arrest.  However, she is clear that she would never want to be sustained long-term on machines.  Full code and full scope remain.  She plans to meet with the palliative team at [facility] to continue goals of care discussions once oncological care is established there.  Goals are clear.  Symptom burden is low.  PMT will step back from daily visits.  Please reengage at patient/family's request, if goals change, or if patient's health deteriorates during hospitalization.  Physical Exam Constitutional:      General: She is not in acute distress.    Appearance: She is normal weight. She is not ill-appearing.  HENT:     Head: Normocephalic.  Cardiovascular:     Rate and Rhythm: Normal rate.  Pulmonary:     Effort: Pulmonary effort is normal.  Abdominal:     Palpations: Abdomen is soft.  Musculoskeletal:     Cervical back: Normal range of motion.  Neurological:     Mental  Status: She is alert and oriented to person, place, and time.  Psychiatric:        Mood and Affect: Mood is not anxious.        Behavior: Behavior normal. Behavior is not agitated.             Total Time 25 minutes   Time spent includes: Detailed review of medical records (labs, imaging, vital signs), medically appropriate exam (mental status, respiratory, cardiac, skin), discussed with treatment team, counseling and  educating patient, family and staff, documenting clinical information, medication management and coordination of care.  Judeen Nose L. Rebbeca Campi, DNP, FNP-BC Palliative Medicine Team

## 2023-09-25 ENCOUNTER — Telehealth: Payer: Self-pay | Admitting: Oncology

## 2023-09-25 ENCOUNTER — Other Ambulatory Visit

## 2023-09-25 ENCOUNTER — Other Ambulatory Visit: Payer: Self-pay

## 2023-09-25 DIAGNOSIS — R0600 Dyspnea, unspecified: Secondary | ICD-10-CM

## 2023-09-25 DIAGNOSIS — R140 Abdominal distension (gaseous): Secondary | ICD-10-CM | POA: Diagnosis not present

## 2023-09-25 DIAGNOSIS — E8770 Fluid overload, unspecified: Secondary | ICD-10-CM | POA: Diagnosis not present

## 2023-09-25 DIAGNOSIS — R188 Other ascites: Secondary | ICD-10-CM

## 2023-09-25 DIAGNOSIS — N179 Acute kidney failure, unspecified: Secondary | ICD-10-CM | POA: Diagnosis not present

## 2023-09-25 LAB — COMPREHENSIVE METABOLIC PANEL WITH GFR
ALT: 13 U/L (ref 0–44)
AST: 19 U/L (ref 15–41)
Albumin: 2 g/dL — ABNORMAL LOW (ref 3.5–5.0)
Alkaline Phosphatase: 100 U/L (ref 38–126)
Anion gap: 8 (ref 5–15)
BUN: 45 mg/dL — ABNORMAL HIGH (ref 8–23)
CO2: 18 mmol/L — ABNORMAL LOW (ref 22–32)
Calcium: 7.6 mg/dL — ABNORMAL LOW (ref 8.9–10.3)
Chloride: 110 mmol/L (ref 98–111)
Creatinine, Ser: 2.67 mg/dL — ABNORMAL HIGH (ref 0.44–1.00)
GFR, Estimated: 18 mL/min — ABNORMAL LOW (ref >60)
Glucose, Bld: 100 mg/dL — ABNORMAL HIGH (ref 70–99)
Potassium: 3.7 mmol/L (ref 3.5–5.1)
Sodium: 136 mmol/L (ref 135–145)
Total Bilirubin: 0.7 mg/dL (ref 0.0–1.2)
Total Protein: 4.9 g/dL — ABNORMAL LOW (ref 6.5–8.1)

## 2023-09-25 LAB — CBC WITH DIFFERENTIAL/PLATELET
Abs Immature Granulocytes: 0.07 10*3/uL (ref 0.00–0.07)
Basophils Absolute: 0 10*3/uL (ref 0.0–0.1)
Basophils Relative: 0 %
Eosinophils Absolute: 0.1 10*3/uL (ref 0.0–0.5)
Eosinophils Relative: 1 %
HCT: 32.5 % — ABNORMAL LOW (ref 36.0–46.0)
Hemoglobin: 10.4 g/dL — ABNORMAL LOW (ref 12.0–15.0)
Immature Granulocytes: 1 %
Lymphocytes Relative: 10 %
Lymphs Abs: 1.1 10*3/uL (ref 0.7–4.0)
MCH: 28.3 pg (ref 26.0–34.0)
MCHC: 32 g/dL (ref 30.0–36.0)
MCV: 88.3 fL (ref 80.0–100.0)
Monocytes Absolute: 0.9 10*3/uL (ref 0.1–1.0)
Monocytes Relative: 8 %
Neutro Abs: 9 10*3/uL — ABNORMAL HIGH (ref 1.7–7.7)
Neutrophils Relative %: 80 %
Platelets: 318 10*3/uL (ref 150–400)
RBC: 3.68 MIL/uL — ABNORMAL LOW (ref 3.87–5.11)
RDW: 13.4 % (ref 11.5–15.5)
WBC: 11.2 10*3/uL — ABNORMAL HIGH (ref 4.0–10.5)
nRBC: 0 % (ref 0.0–0.2)

## 2023-09-25 LAB — PHOSPHORUS: Phosphorus: 2.8 mg/dL (ref 2.5–4.6)

## 2023-09-25 LAB — MAGNESIUM: Magnesium: 1.8 mg/dL (ref 1.7–2.4)

## 2023-09-25 MED ORDER — MIDODRINE HCL 5 MG PO TABS
5.0000 mg | ORAL_TABLET | Freq: Three times a day (TID) | ORAL | 0 refills | Status: DC
  Filled 2023-09-25: qty 15, 5d supply, fill #0

## 2023-09-25 MED ORDER — ENSURE PLUS HIGH PROTEIN PO LIQD
237.0000 mL | Freq: Two times a day (BID) | ORAL | 0 refills | Status: AC
Start: 2023-09-25 — End: 2023-10-25
  Filled 2023-09-25: qty 14220, 30d supply, fill #0

## 2023-09-25 NOTE — Plan of Care (Signed)

## 2023-09-25 NOTE — Telephone Encounter (Signed)
 Patient is being discharged from the hospital today. Nurse from floor called to schedule follow up. Please advise

## 2023-09-25 NOTE — Progress Notes (Addendum)
 [facility]  ROUNDING NOTE   Subjective:   Patient seen siting up in chair Just completed session with PT States it went well and she was able to ambulate Remains on room air, feels slightly winded, but not terrible Continues to tolerate meals  Creatinine 2.67  Objective:  Vital signs in last 24 hours:  Temp:  [98.2 F (36.8 C)-99.3 F (37.4 C)] 99.3 F (37.4 C) (06/06 0823) Pulse Rate:  [80-98] 87 (06/06 0823) Resp:  [16-20] 16 (06/06 0823) BP: (99-122)/(58-75) 117/75 (06/06 0823) SpO2:  [95 %-97 %] 95 % (06/06 0823)  Weight change:  Filed Weights   09/21/23 1212  Weight: 58.7 kg    Intake/Output: I/O last 3 completed shifts: In: 474 [P.O.:474] Out: 100 [Urine:100]   Intake/Output this shift:  No intake/output data recorded.  Physical Exam: General: NAD, sitting in chair  Head: Normocephalic, atraumatic. Moist oral mucosal membranes  Eyes: Anicteric  Lungs:  Clear to auscultation, normal effort  Heart: Regular rate and rhythm  Abdomen:  Soft, nontender, mild distention  Extremities:  Trace-1+ peripheral edema.  Neurologic: Awake, alert, conversant  Skin: Warm,dry, no rash       Basic Metabolic Panel: Recent Labs  Lab 09/20/23 0642 09/21/23 1229 09/22/23 0506 09/22/23 1749 09/23/23 0444 09/24/23 0439 09/25/23 0527  NA 135   < > 136 134* 134* 134* 136  K 3.5   < > 3.8 3.7 3.7 3.8 3.7  CL 109   < > 107 107 106 108 110  CO2 19*   < > 19* 16* 17* 18* 18*  GLUCOSE 121*   < > 95 104* 106* 96 100*  BUN 27*   < > 31* 34* 36* 41* 45*  CREATININE 1.23*   < > 1.92* 2.35* 2.61* 2.85* 2.67*  CALCIUM  7.9*   < > 7.9* 8.0* 8.1* 7.7* 7.6*  MG 2.5*  --  2.1  --  2.0 2.0 1.8  PHOS 3.1  --  3.1  --  3.2 3.1 2.8   < > = values in this interval not displayed.    Liver Function Tests: Recent Labs  Lab 09/22/23 0506 09/22/23 1749 09/23/23 0444 09/24/23 0439 09/25/23 0527  AST 18 20 21 21 19   ALT 12 13 13 13 13   ALKPHOS 100 112 116 100 100   BILITOT 0.7 0.6 0.7 0.7 0.7  PROT 5.1* 5.5* 5.6* 5.0* 4.9*  ALBUMIN 2.3* 2.5* 2.5* 2.1* 2.0*   No results for input(s): "LIPASE", "AMYLASE" in the last 168 hours. No results for input(s): "AMMONIA" in the last 168 hours.  CBC: Recent Labs  Lab 09/21/23 1229 09/21/23 1526 09/22/23 0506 09/23/23 0444 09/24/23 0439 09/25/23 0527  WBC 12.9* 14.0* 9.7 10.9* 10.4 11.2*  NEUTROABS 10.8*  --  7.6 8.6* 8.2* 9.0*  HGB 11.3* 11.4* 9.5* 10.6* 10.3* 10.4*  HCT 36.0 36.5 30.2* 33.3* 32.7* 32.5*  MCV 90.7 90.6 89.1 89.5 89.6 88.3  PLT 437* 466* 357 369 324 318    Cardiac Enzymes: No results for input(s): "CKTOTAL", "CKMB", "CKMBINDEX", "TROPONINI" in the last 168 hours.  BNP: Invalid input(s): "POCBNP"  CBG: No results for input(s): "GLUCAP" in the last 168 hours.  Microbiology: Results for orders placed or performed during the hospital encounter of 09/21/23  Acid Fast Smear (AFB)     Status: None   Collection Time: 09/23/23 10:32 AM   Specimen: PATH Cytology Peritoneal fluid  Result Value Ref Range Status   AFB Specimen Processing Concentration  Final  Acid Fast Smear Negative  Final    Comment: (NOTE) Performed At: [facility] [address] [place], [place] [id number] Pearlean Botts MD JY:[id number]    Source (AFB) CYTO PERI  Final    Comment: Performed at [facility], [address] [place], [place] [address]  Anaerobic culture w Gram Stain     Status: None (Preliminary result)   Collection Time: 09/23/23 10:32 AM   Specimen: Fluid  Result Value Ref Range Status   Specimen Description FLUID PERITONEAL  Final   Special Requests NONE  Final   Gram Stain   Final    FEW WBC PRESENT,BOTH PMN AND MONONUCLEAR NO ORGANISMS SEEN Performed at [facility], [address], [place], [place] [address]    Culture PENDING  Incomplete   Report Status PENDING  Incomplete  Body fluid culture w Gram Stain     Status: None (Preliminary result)   Collection  Time: 09/23/23 10:47 AM   Specimen: PATH Cytology Peritoneal fluid  Result Value Ref Range Status   Specimen Description   Final    FLUID Performed at [facility], [address] [place], [place] [address]    Special Requests PERITONEAL  Final   Gram Stain   Final    FEW WBC PRESENT,BOTH PMN AND MONONUCLEAR NO ORGANISMS SEEN    Culture   Final    NO GROWTH < 12 HOURS Performed at [facility], [address], [place], [place] [address]    Report Status PENDING  Incomplete    Coagulation Studies: No results for input(s): "LABPROT", "INR" in the last 72 hours.   Urinalysis: Recent Labs    09/23/23 1500  COLORURINE YELLOW*  YELLOW*  LABSPEC 1.015  1.015  PHURINE 5.0  5.0  GLUCOSEU NEGATIVE  NEGATIVE  HGBUR SMALL*  SMALL*  BILIRUBINUR NEGATIVE  NEGATIVE  KETONESUR NEGATIVE  NEGATIVE  PROTEINUR NEGATIVE  NEGATIVE  NITRITE NEGATIVE  NEGATIVE  LEUKOCYTESUR TRACE*  SMALL*      Imaging: US  Paracentesis Result Date: 09/23/2023 INDICATION: 78 year old female with a history of CKD IIIa, breast cancer, and currently undergoing workup for multiple myeloma. She presented to the ED due to lower extremity swelling and volume overload. Request for diagnostic and therapeutic paracentesis. EXAM: ULTRASOUND GUIDED right PARACENTESIS MEDICATIONS: 1% lidocaine, 7 mL COMPLICATIONS: None immediate. PROCEDURE: Informed written consent was obtained from the patient after a discussion of the risks, benefits and alternatives to treatment. A timeout was performed prior to the initiation of the procedure. Initial ultrasound scanning demonstrates a large amount of ascites within the right lower abdominal quadrant. The right lower abdomen was prepped and draped in the usual sterile fashion. 1% lidocaine was used for local anesthesia. Following this, a 19 gauge, 7-cm, Yueh catheter was introduced. An ultrasound image was saved for documentation purposes. The paracentesis was  performed. The catheter was removed and a dressing was applied. The patient tolerated the procedure well without immediate post procedural complication. FINDINGS: A total of approximately 3.5 L of amber fluid was removed. Samples were sent to the laboratory as requested by the clinical team. IMPRESSION: Successful ultrasound-guided paracentesis yielding 3.5 liters of peritoneal fluid. Procedure performed by: Estella Helling, PA-C under the supervision of Dr. Baldemar Lev Electronically Signed   By: Fernando Hoyer M.D.   On: 09/23/2023 15:40     Medications:     feeding supplement  237 mL Oral BID BM   ferrous sulfate  325 mg Oral Q breakfast  heparin   5,000 Units Subcutaneous Q8H   midodrine  5 mg Oral TID WC   rosuvastatin   10 mg Oral Daily   sertraline   75 mg Oral Daily   acetaminophen , ipratropium-albuterol, Muscle Rub, sodium chloride   Assessment/ Plan:  Felicia Robinson is a 78 y.o.  female with past medical history of anemia,Osteoporosis, hypophosphatemia with rickets, chronic kidney disease stage III A, who was admitted to [facility] on 09/21/2023 for Abdominal distension [R14.0] Lower extremity edema [R60.0] Volume overload [E87.70] Dyspnea, unspecified type [R06.00] AKI (acute kidney injury) (HCC) [N17.9]   Acute kidney injury on chronic kidney disease stage IIIa.  Baseline creatinine 1.16 with GFR 49 on 05/25/2023.  Acute kidney injury likely secondary to hypotension, and intravascular depletion due to aggressive diuresis. Also IV contrast exposure on 6/1. Renal ultrasound negative for obstruction. Agree with holding diuresis and avoiding IVF.  Urinalysis appears hazy but unremarkable.  Protein creatinine ratio 0.11.  Creatinine has shown some Improvement today. Patient will need follow up in our office at discharge.   Lab Results  Component Value Date   CREATININE 2.67 (H) 09/25/2023   CREATININE 2.85 (H) 09/24/2023   CREATININE 2.61 (H) 09/23/2023    Intake/Output  Summary (Last 24 hours) at 09/25/2023 1014 Last data filed at 09/25/2023 0345 Gross per 24 hour  Intake 474 ml  Output --  Net 474 ml    2. Ascites -noted on ultrasound.  Etiology unclear. IR consulted for paracentesis, 3.5L achieved.    Able to ambulate with PT with minimal shortness of breath. Remains on room air  3. Anemia of chronic kidney disease Lab Results  Component Value Date   HGB 10.4 (L) 09/25/2023    Hemoglobin within desired goal  4. Secondary Hyperparathyroidism: with outpatient labs: PTH 81 on 05/25/23  Lab Results  Component Value Date   PTH 81 (H) 05/25/2023   CALCIUM  7.6 (L) 09/25/2023   PHOS 2.8 09/25/2023    Cholecalciferol ordered outpatient.  Corrected calcium  remains 9.2.   LOS: 3 Felicia Robinson 6/6/202510:14 AM  Patient was seen and examined with Anola King, NP.  Plan of care was formulated for the problems addressed and discussed with NP.  I agree with the note as documented except as noted below.

## 2023-09-25 NOTE — Evaluation (Signed)
 Occupational Therapy Evaluation Patient Details Name: Felicia Robinson MRN: [id number] DOB: 06/10/1945 Today's Date: 09/25/2023   History of Present Illness   Pt is a 78 y.o. female presenting to hospital 09/21/23 with B LE swelling, abdominal distention x1 day, and SOB; discharged day prior from hospital (AKI and dehydration).  Pt admitted with volume overload, AKI on CKD, N/V, chronic diarrhea, diffuse lytic lesions, leukocytosis, thrombocytosis.  S/p paracentesis 09/23/23.  PMH includes stage IIIa CKD, X-linked hypophosphatemic rickets, osteoporosis, anemia, B12 deficiency, vitamin D  deficiency, h/o L breast CA s/p mastectomy, HTN.  Per notes, pt recently diagnosed with lytic lesions.     Clinical Impressions Upon entering the room, pt seated in recliner chair and standing with staff to go to bathroom. Pt transitioned to OT evaluation without issue. Pt ambulates with SPC into bathroom at mod I level. Pt manages clothing, hygiene, and toilet transfer without assistance. Pt standing at sink for hand hygiene and returning to room. Pt transfers self back into bed and scoots back towards HOB in log sitting position. Pt is close to baseline and reports feeling much better. She lives at home alone but does have family support at discharge. Pt has all needed equipment. OT to complete orders at this time as pt does not need acute OT intervention.      If plan is discharge home, recommend the following:         Functional Status Assessment   Patient has not had a recent decline in their functional status     Equipment Recommendations   None recommended by OT      Precautions/Restrictions   Precautions Precautions: Fall Recall of Precautions/Restrictions: Intact Restrictions Weight Bearing Restrictions Per Provider Order: No     Mobility Bed Mobility Overal bed mobility: Modified Independent             General bed mobility comments: increased effort but no assistance for  sit>supine    Transfers Overall transfer level: Modified independent Equipment used: Straight cane Transfers: Sit to/from Stand Sit to Stand: Modified independent (Device/Increase time)                  Balance Overall balance assessment: Needs assistance Sitting-balance support: Feet supported Sitting balance-Leahy Scale: Normal     Standing balance support: Single extremity supported, During functional activity Standing balance-Leahy Scale: Good                             ADL either performed or assessed with clinical judgement   ADL Overall ADL's : Modified independent                                       General ADL Comments: increased time and use of SPC but no physical assistance or LOB noted during self care tasks     Vision Patient Visual Report: No change from baseline              Pertinent Vitals/Pain Pain Assessment Pain Assessment: No/denies pain     Extremity/Trunk Assessment Upper Extremity Assessment Upper Extremity Assessment: Overall WFL for tasks assessed   Lower Extremity Assessment Lower Extremity Assessment: Generalized weakness   Cervical / Trunk Assessment Cervical / Trunk Assessment: Normal   Communication Communication Communication: Impaired Factors Affecting Communication: Hearing impaired   Cognition Arousal: Alert Behavior During Therapy: WFL for tasks assessed/performed Cognition: No  apparent impairments                               Following commands: Intact       Cueing  General Comments   Cueing Techniques: Verbal cues;Tactile cues              Home Living Family/patient expects to be discharged to:: Private residence Living Arrangements: Alone Available Help at Discharge: Family;Friend(s);Available PRN/intermittently;Other (Comment) Type of Home: House Home Access: Level entry     Home Layout: One level     Bathroom Shower/Tub: Company secretary: Standard     Home Equipment: Grab bars - tub/shower;Grab bars - toilet;Cane - single point;Shower seat - built in          Prior Functioning/Environment Prior Level of Function : Independent/Modified Independent;History of Falls (last six months)             Mobility Comments: In home ambulation without an AD, for longer community ambulation requires SPC, hx of falls with most recent being six months ago ADLs Comments: Independent with ADLs and drives            OT Goals(Current goals can be found in the care plan section)   Acute Rehab OT Goals Patient Stated Goal: to go home OT Goal Formulation: With patient Time For Goal Achievement: 09/25/23 Potential to Achieve Goals: Fair   OT Frequency:          AM-PAC OT "6 Clicks" Daily Activity     Outcome Measure Help from another person eating meals?: None Help from another person taking care of personal grooming?: None Help from another person toileting, which includes using toliet, bedpan, or urinal?: None Help from another person bathing (including washing, rinsing, drying)?: None Help from another person to put on and taking off regular upper body clothing?: None Help from another person to put on and taking off regular lower body clothing?: None 6 Click Score: 24   End of Session Equipment Utilized During Treatment: Other (comment) [facility])  Activity Tolerance: Patient tolerated treatment well Patient left: in bed                   Time: 4098-1191 OT Time Calculation (min): 20 min Charges:  OT General Charges $OT Visit: 1 Visit OT Evaluation $OT Eval Low Complexity: 1 Low OT Treatments $Self Care/Home Management : 8-22 mins  ,George Kinder, MS, OTR/L , CBIS ascom [phone]  09/25/23, 11:34 AM

## 2023-09-25 NOTE — TOC Transition Note (Signed)
 Transition of Care [facility]) - Discharge Note   Patient Details  Name: Felicia Robinson MRN: [id number] Date of Birth: 12-29-1945  Transition of Care [facility]) CM/SW Contact:  Valisa Karpel A Silver Parkey, RN Phone Number: 09/25/2023, 3:42 PM   Clinical Narrative:    Chart reviewed.  Noted that patient has orders for discharge today.    I have informed [name] and her son Rodman Clam that PT has recommend Outpatient PT/OT.  Patient would like to receive home health therapy. I have asked Dr. Mason Sole to write orders for Home Health PT/OT.  Patient did not have a home health preference.  I have asked [place]  with [facility] to accept home health referral.  [facility] will provide PT/OT on discharge.    Patient has a walker and a cane at home.    I have informed staff nurse of the above information.     Final next level of care: Home w Home Health Services Barriers to Discharge: No Barriers Identified   Patient Goals and CMS Choice   CMS Medicare.gov Compare Post Acute Care list provided to:: Patient Choice offered to / list presented to : Patient      Discharge Placement                  Name of family member notified: [place] Patient and family notified of of transfer: 09/25/23  Discharge Plan and Services Additional resources added to the After Visit Summary for                            [facility] Arranged: PT, OT [facility] Agency: [facility] Date [facility] Agency Contacted: 09/25/23   Representative spoke with at [place] Agency: [place]   Social Drivers of Health (SDOH) Interventions SDOH Screenings   Food Insecurity: No Food Insecurity (09/21/2023)  Housing: Low Risk  (09/21/2023)  Transportation Needs: No Transportation Needs (09/21/2023)  Utilities: Not At Risk (09/21/2023)  Alcohol Screen: Low Risk  (09/20/2023)  Depression (PHQ2-9): High Risk (09/21/2023)  Financial Resource Strain: Low Risk  (09/20/2023)  Physical Activity: Inactive (09/20/2023)  Social Connections: Moderately Isolated  (09/21/2023)  Stress: No Stress Concern Present (09/20/2023)  Tobacco Use: Medium Risk (09/21/2023)  Health Literacy: Adequate Health Literacy (09/18/2023)     Readmission Risk Interventions    09/24/2023   12:30 PM  Readmission Risk Prevention Plan  PCP or Specialist Appt within 5-7 Days --  Home Care Screening --  Medication Review (RN CM) Complete

## 2023-09-25 NOTE — Evaluation (Signed)
 Physical Therapy Evaluation Patient Details Name: Felicia Robinson MRN: [id number] DOB: [birth year] Today's Date: 09/25/2023  History of Present Illness  Pt is a 78 y.o. female presenting to hospital 09/21/23 with B LE swelling, abdominal distention x1 day, and SOB; discharged day prior from hospital (AKI and dehydration).  Pt admitted with volume overload, AKI on CKD, N/V, chronic diarrhea, diffuse lytic lesions, leukocytosis, thrombocytosis.  S/p paracentesis 09/23/23.  PMH includes stage IIIa CKD, X-linked hypophosphatemic rickets, osteoporosis, anemia, B12 deficiency, vitamin D  deficiency, h/o L breast CA s/p mastectomy, HTN.  Per notes, pt recently diagnosed with lytic lesions.  Clinical Impression  Prior to recent medical concerns, pt reports being modified independent with ADLs, ambulates household distances without an AD, but requires a [facility] for longer ambulation bouts in the community; lives alone in a 1 level home with no steps to enter. Currently pt is performing all bed mobility with SBA (utilizing BUE support on handrails) has good dynamic and static sitting balance (able to reach outside of her BOS), performs STS transfer with SBA and is currently ambulating 120 ft with SPC and CGA for increased safety. Pt demonstrates decreased endurance, decreased ambulation distances and generalized LE weakness compared to baseline. Upon hospital discharge, pt would benefit from ongoing therapy to continue to work towards PLOF.       If plan is discharge home, recommend the following: A little help with walking and/or transfers;Assist for transportation   Can travel by private vehicle        Equipment Recommendations None recommended by PT  Recommendations for Other Services  OT consult    Functional Status Assessment Patient has had a recent decline in their functional status and demonstrates the ability to make significant improvements in function in a reasonable and predictable amount of time.      Precautions / Restrictions Precautions Precautions: Fall Recall of Precautions/Restrictions: Intact Restrictions Weight Bearing Restrictions Per Provider Order: No      Mobility  Bed Mobility Overal bed mobility: Modified Independent             General bed mobility comments: Is able to roll and come supine to sit with BUE on hand rails to initiate movement and come towards EOB    Transfers Overall transfer level: Needs assistance Equipment used: Straight cane Transfers: Sit to/from Stand Sit to Stand: Supervision           General transfer comment: No vc's required    Ambulation/Gait Ambulation/Gait assistance: Contact guard assist Gait Distance (Feet): 120 Feet Assistive device: Straight cane Gait Pattern/deviations: Step-to pattern, Decreased step length - right, Decreased step length - left, Narrow base of support, Decreased stride length, Trunk flexed (Adduction of RLE but does not cross line of progression) Gait velocity: decreased     General Gait Details: No LOB noted during ambulation  Stairs            Wheelchair Mobility     Tilt Bed    Modified Rankin (Stroke Patients Only)       Balance Overall balance assessment: Needs assistance Sitting-balance support: Feet supported Sitting balance-Leahy Scale: Good Sitting balance - Comments: Can maintain static and dynamic sitting balance, reaching outside her BOS, with BLE on the ground.   Standing balance support: Single extremity supported, During functional activity Standing balance-Leahy Scale: Good Standing balance comment: Can maintain static standing balance with RUE supported with SPC  Pertinent Vitals/Pain Pain Assessment Pain Assessment: No/denies pain    Home Living Family/patient expects to be discharged to:: Private residence Living Arrangements: Alone Available Help at Discharge: Family;Friend(s);Available  PRN/intermittently;Other (Comment) (Son is available when he comes to town but currently resides 3+ hours away) Type of Home: House Home Access: Level entry (No steps to enter, just door threshold)       Home Layout: One level Home Equipment: Grab bars - tub/shower;Grab bars - toilet;Cane - single point;Shower seat - built in      Prior Function Prior Level of Function : Independent/Modified Independent;History of Falls (last six months)             Mobility Comments: In home ambulation without an AD, for longer community ambulation requires SPC, hx of falls with most recent being six months ago ADLs Comments: Independent with ADLs and drives     Extremity/Trunk Assessment   Upper Extremity Assessment Upper Extremity Assessment: Defer to OT evaluation (ROM for BUE intact and WFL, no pain with motion)    Lower Extremity Assessment Lower Extremity Assessment: Generalized weakness  MMT:   MMT Right  Left   Hip flexion 4-/5 4-/5  Hip abduction    Hip adduction    Hip internal rotation    Hip external rotation    Knee flexion 4+/5 4-/5  Knee extension 5/5 5/5  Ankle dorsiflexion 4+/5 4+/5  Ankle plantarflexion 5/5 5/5  Ankle inversion    Ankle eversion    (Blank rows = not tested)  Cervical / Trunk Assessment Cervical / Trunk Assessment: Normal  Communication   Communication Communication: Impaired Factors Affecting Communication: Hearing impaired (Has hearing aids in the room)    Cognition Arousal: Alert Behavior During Therapy: WFL for tasks assessed/performed   PT - Cognitive impairments: No apparent impairments                       PT - Cognition Comments: AxO x 4, can recall name, DOB, PMH, reason of most recent hospitalization and date Following commands: Intact       Cueing Cueing Techniques: Verbal cues, Tactile cues     General Comments      Exercises     Assessment/Plan    PT Assessment Patient needs continued PT services  PT  Problem List Decreased strength;Decreased balance;Decreased mobility;Decreased activity tolerance       PT Treatment Interventions DME instruction;Gait training;Functional mobility training;Therapeutic activities;Therapeutic exercise;Balance training;Patient/family education    PT Goals (Current goals can be found in the Care Plan section)  Acute Rehab PT Goals Patient Stated Goal: to walk out of here PT Goal Formulation: With patient Time For Goal Achievement: 10/09/23 Potential to Achieve Goals: Good    Frequency Min 2X/week     Co-evaluation               AM-PAC PT "6 Clicks" Mobility  Outcome Measure Help needed turning from your back to your side while in a flat bed without using bedrails?: None Help needed moving from lying on your back to sitting on the side of a flat bed without using bedrails?: None Help needed moving to and from a bed to a chair (including a wheelchair)?: A Little Help needed standing up from a chair using your arms (e.g., wheelchair or bedside chair)?: A Little Help needed to walk in hospital room?: A Little Help needed climbing 3-5 steps with a railing? : A Little 6 Click Score: 20    End of  Session Equipment Utilized During Treatment: Gait belt Activity Tolerance: Patient tolerated treatment well Patient left: with call bell/phone within reach;in chair;with chair alarm set Nurse Communication: Mobility status PT Visit Diagnosis: History of falling (Z91.81);Difficulty in walking, not elsewhere classified (R26.2);Muscle weakness (generalized) (M62.81)    Time: 1610-9604 PT Time Calculation (min) (ACUTE ONLY): 30 min   Charges:                 Traveon Louro, SPT  Braydyn Schultes 09/25/2023, 11:13 AM

## 2023-09-26 LAB — TOTAL BILIRUBIN, BODY FLUID: Total bilirubin, fluid: 0.3 mg/dL

## 2023-09-27 LAB — BODY FLUID CULTURE W GRAM STAIN: Culture: NO GROWTH

## 2023-09-27 LAB — TRIGLYCERIDES, BODY FLUIDS: Triglycerides, Fluid: 49 mg/dL

## 2023-09-27 LAB — LIPASE, FLUID: Lipase-Fluid: 17 U/L

## 2023-09-27 NOTE — Patient Instructions (Signed)
 Be Involved in Caring For Your Health:  Taking Medications When medications are taken as directed, they can greatly improve your health. But if they are not taken as prescribed, they may not work. In some cases, not taking them correctly can be harmful. To help ensure your treatment remains effective and safe, understand your medications and how to take them. Bring your medications to each visit for review by your provider.  Your lab results, notes, and after visit summary will be available on My Chart. We strongly encourage you to use this feature. If lab results are abnormal the clinic will contact you with the appropriate steps. If the clinic does not contact you assume the results are satisfactory. You can always view your results on My Chart. If you have questions regarding your health or results, please contact the clinic during office hours. You can also ask questions on My Chart.  We at [facility] are grateful that you chose [place] to provide your care. We strive to provide evidence-based and compassionate care and are always looking for feedback. If you get a survey from the clinic please complete this so we can hear your opinions.  Heart-Healthy Eating Plan Many factors influence your heart health, including eating and exercise habits. Heart health is also called coronary health. Coronary risk increases with abnormal blood fat (lipid) levels. A heart-healthy eating plan includes limiting unhealthy fats, increasing healthy fats, limiting salt (sodium) intake, and making other diet and lifestyle changes. What is my plan? Your health care provider may recommend that: You limit your fat intake to _________% or less of your total calories each day. You limit your saturated fat intake to _________% or less of your total calories each day. You limit the amount of cholesterol in your diet to less than _________ mg per day. You limit the amount of sodium in your diet to less than _________  mg per day. What are tips for following this plan? Cooking Cook foods using methods other than frying. Baking, boiling, grilling, and broiling are all good options. Other ways to reduce fat include: Removing the skin from poultry. Removing all visible fats from meats. Steaming vegetables in water or broth. Meal planning  At meals, imagine dividing your plate into fourths: Fill one-half of your plate with vegetables and green salads. Fill one-fourth of your plate with whole grains. Fill one-fourth of your plate with lean protein foods. Eat 2-4 cups of vegetables per day. One cup of vegetables equals 1 cup (91 g) broccoli or cauliflower florets, 2 medium carrots, 1 large bell pepper, 1 large sweet potato, 1 large tomato, 1 medium white potato, 2 cups (150 g) raw leafy greens. Eat 1-2 cups of fruit per day. One cup of fruit equals 1 small apple, 1 large banana, 1 cup (237 g) mixed fruit, 1 large orange,  cup (82 g) dried fruit, 1 cup (240 mL) 100% fruit juice. Eat more foods that contain soluble fiber. Examples include apples, broccoli, carrots, beans, peas, and barley. Aim to get 25-30 g of fiber per day. Increase your consumption of legumes, nuts, and seeds to 4-5 servings per week. One serving of dried beans or legumes equals  cup (90 g) cooked, 1 serving of nuts is  oz (12 almonds, 24 pistachios, or 7 walnut halves), and 1 serving of seeds equals  oz (8 g). Fats Choose healthy fats more often. Choose monounsaturated and polyunsaturated fats, such as olive and canola oils, avocado oil, flaxseeds, walnuts, almonds, and seeds. Eat  more omega-3 fats. Choose salmon, mackerel, sardines, tuna, flaxseed oil, and ground flaxseeds. Aim to eat fish at least 2 times each week. Check food labels carefully to identify foods with trans fats or high amounts of saturated fat. Limit saturated fats. These are found in animal products, such as meats, butter, and cream. Plant sources of saturated fats  include palm oil, palm kernel oil, and coconut oil. Avoid foods with partially hydrogenated oils in them. These contain trans fats. Examples are stick margarine, some tub margarines, cookies, crackers, and other baked goods. Avoid fried foods. General information Eat more home-cooked food and less restaurant, buffet, and fast food. Limit or avoid alcohol. Limit foods that are high in added sugar and simple starches such as foods made using white refined flour (white breads, pastries, sweets). Lose weight if you are overweight. Losing just 5-10% of your body weight can help your overall health and prevent diseases such as diabetes and heart disease. Monitor your sodium intake, especially if you have high blood pressure. Talk with your health care provider about your sodium intake. Try to incorporate more vegetarian meals weekly. What foods should I eat? Fruits All fresh, canned (in natural juice), or frozen fruits. Vegetables Fresh or frozen vegetables (raw, steamed, roasted, or grilled). Green salads. Grains Most grains. Choose whole wheat and whole grains most of the time. Rice and pasta, including brown rice and pastas made with whole wheat. Meats and other proteins Lean, well-trimmed beef, veal, pork, and lamb. Chicken and [place] without skin. All fish and shellfish. Wild duck, rabbit, pheasant, and venison. Egg whites or low-cholesterol egg substitutes. Dried beans, peas, lentils, and tofu. Seeds and most nuts. Dairy Low-fat or nonfat cheeses, including ricotta and mozzarella. Skim or 1% milk (liquid, powdered, or evaporated). Buttermilk made with low-fat milk. Nonfat or low-fat yogurt. Fats and oils Non-hydrogenated (trans-free) margarines. Vegetable oils, including soybean, sesame, sunflower, olive, avocado, peanut, safflower, corn, canola, and cottonseed. Salad dressings or mayonnaise made with a vegetable oil. Beverages Water (mineral or sparkling). Coffee and tea. Unsweetened ice  tea. Diet beverages. Sweets and desserts Sherbet, gelatin, and fruit ice. Small amounts of dark chocolate. Limit all sweets and desserts. Seasonings and condiments All seasonings and condiments. The items listed above may not be a complete list of foods and beverages you can eat. Contact a dietitian for more options. What foods should I avoid? Fruits Canned fruit in heavy syrup. Fruit in cream or butter sauce. Fried fruit. Limit coconut. Vegetables Vegetables cooked in cheese, cream, or butter sauce. Fried vegetables. Grains Breads made with saturated or trans fats, oils, or whole milk. Croissants. Sweet rolls. Donuts. High-fat crackers, such as cheese crackers and chips. Meats and other proteins Fatty meats, such as hot dogs, ribs, sausage, bacon, rib-eye roast or steak. High-fat deli meats, such as salami and bologna. Caviar. Domestic duck and goose. Organ meats, such as liver. Dairy Cream, sour cream, cream cheese, and creamed cottage cheese. Whole-milk cheeses. Whole or 2% milk (liquid, evaporated, or condensed). Whole buttermilk. Cream sauce or high-fat cheese sauce. Whole-milk yogurt. Fats and oils Meat fat, or shortening. Cocoa butter, hydrogenated oils, palm oil, coconut oil, palm kernel oil. Solid fats and shortenings, including bacon fat, salt pork, lard, and butter. Nondairy cream substitutes. Salad dressings with cheese or sour cream. Beverages Regular sodas and any drinks with added sugar. Sweets and desserts Frosting. Pudding. Cookies. Cakes. Pies. Milk chocolate or white chocolate. Buttered syrups. Full-fat ice cream or ice cream drinks. The items listed above may  not be a complete list of foods and beverages to avoid. Contact a dietitian for more information. Summary Heart-healthy meal planning includes limiting unhealthy fats, increasing healthy fats, limiting salt (sodium) intake and making other diet and lifestyle changes. Lose weight if you are overweight. Losing just  5-10% of your body weight can help your overall health and prevent diseases such as diabetes and heart disease. Focus on eating a balance of foods, including fruits and vegetables, low-fat or nonfat dairy, lean protein, nuts and legumes, whole grains, and heart-healthy oils and fats. This information is not intended to replace advice given to you by your health care provider. Make sure you discuss any questions you have with your health care provider. Document Revised: 05/13/2021 Document Reviewed: 05/13/2021 [employer] Patient Education  2024 [employer].

## 2023-09-27 NOTE — Discharge Summary (Signed)
 Physician Discharge Summary   Patient: Roslynn Holte MRN: [id number] DOB: May 26, 1945  Admit date:     09/21/2023  Discharge date: 09/25/2023  Discharge Physician: Brenna Cam   PCP: Lemar Pyles, NP   Recommendations at discharge:    F/up with outpt providers as requested  Discharge Diagnoses: Active Problems:   AKI (acute kidney injury) (HCC)   Ascites of liver  Principal Problem (Resolved):   Volume overload Resolved Problems:   Abdominal distension   Dyspnea  Hospital Course: Assessment and Plan:  78 y.o. female with medical history significant of stage IIIa CKD, X-linked hypophosphatemic rickets follows with [facility], osteoporosis, anemia, B12 deficiency, grief reaction of sertraline , mixed hyperlipidemia, vitamin D  deficiency, history of breast cancer, hypertension. Patient was admitted to the hospital 5/30 through 6/1 with prerenal dehydration induced AKI secondary to nausea, vomiting, diarrhea.  While admitted she received 24 hours of IV fluids.  At time of discharge kidney function was gradually improving and patient had improved to her physiologic baseline. When patient returned home she noted increased swelling in her lower extremities and at PCP follow-up today she was found to be overtly volume overloaded.  It was recommended she return to the ER.  Of note patient has recently diagnosed lytic lesions.  Workup is ongoing at this time.  Multiple myeloma panel has been sent and is still pending.  Patient has outpatient follow-up with oncology and scheduled PET scan as an outpt. On arrival to the ED this admission patient is found to have 1+ pitting edema in her lower extremities, and abdominal distention.  She reports she is currently 13 pounds above her normal weight.  Labs revealed BNP 187, WBC 12.9, creatinine 1.7.  Chest x-ray with small left pleural effusion, mild cardiomegaly.  Lower extremity Dopplers are negative for DVT.  Patient is stable on room air but endorses  abdominal discomfort due to the swelling.  She denies any chest pain.  She has had no fever.     6/4: Palliative care and nephrology consult.  Ultrasound-guided paracentesis with 3.5 L of fluid removal 6/5: PT and OT eval, discontinue telemetry, stopping/holding aspirin  as she had/maroon stool.  Volume overload on admission - Diuresed initially - echo shows normal function - Small pleural effusion on CXR, stable since prior admission  AKI on CKD 3a - Recently admitted with prerenal injury secondary to vomiting, nausea, anorexia.  Creatinine down trended to 1.23 on 6/1, up trended to 1.7 at admission.  Patient did receive CT contrast on 6/1 prior to discharge - Creatinine continues to rise, BUN & creat likely due to contrast exposure and Diuresis - Hold Diuresis and function improved.   Ascites - s/p ultrasound guided paracentesis with removal of 2.5 L of fluid - fluid seems exudative in nature, outpt f/up with Onco   Diffuse lytic lesions - Presumed malignancy,  unable to find primary source at this time.  Lytic lesions difficult to truly characterize in setting of profound osteopenia. - - No heart failure and normal liver function.  Cell lines mostly stable, do not suspect the bone marrow biopsy would be immediately helpful.  Fluid density lesion of the right gluteal musculature appears to have simple fluid collection, not likely malignant per radiology review. -CT abdomen pelvis 5/25: Chronic 6 mm hepatic lobe lesion, stable in size.  3 x 2.5 cm well-circumscribed fluid density lesion above the right gluteal musculature, small volume ascites containing umbilical hernia, small fat-containing right inguinal hernia.  Diffusely heterogenous axial and appendicular skeleton  with multiple underlying lytic lesions, redemonstration of a chronic S3 sacral foramina fluid density lesion expanding diaphragm and is suggestive of a Tarlov cyst. - Patient has established follow-up with Dr. [facility].  -  Multiple myeloma panel not suggestive of myeloma.  Cannot rule out nonsecretory myeloma per oncology - Peripheral smear review was unremarkable WBC morphology - Chest CT without primary lesion.  No significant adenopathy - Outpatient PET scan per oncology,  If any bone lesions are hypermetabolic she will need biopsy as an outpatient   Nausea, vomiting - Resolved at this time.  Has been chronic and intermittent for the last 3 weeks - Workup on prior admission reveals COVID/RSV/flu negative.  TSH and hemoglobin A1c unremarkable - She has been tolerating her diet for now   Chronic diarrhea - Patient endorses intermittent diarrhea for the last many months - GI PCR negative - Would benefit from outpatient GI follow-up.   Leukocytosis - WBC 12.9 on arrival.  No infectious signs or symptoms - Was previously elevated on prior admission and downtrended without intervention.  May be reactive vs malignancy associated - Urine culture 5/25 with 30,000 colonies of lactobacillus.  Patient completed course of Keflex.  Denies any urinary symptoms - Chest x-ray without infiltrates   Thrombocytosis - Platelets 437 on arrival, 472 on 5/30 which downtrended to normal without intervention. - Leukocytosis and thrombocytosis likely in some part related to underlying malignancy/multiple myeloma. Outpt Onco f/up   History of breast cancer, 2013.  In remission since 2018. - Status post left mastectomy -- Has had annual mammograms without recurrence. Most recent 10/2022 WNL   X-linked hypophosphatemia rickets - Follows with [facility] endocrinology - History of bilateral femoral stress fractures 2019, tibial fracture 2021 - Receives Cryvista injections monthly.  Scheduled for next dose this week. Holding for now.   B12 deficiency History of iron deficiency anemia - Continue with iron and B12 supplementation - Hemoglobin appears near baseline at this time   Grief reaction - Continue sertraline  50mg  dose     Mixed hyperlipidemia - Continue statin   Hypertension - Pressure currently at goal without antihypertensives.  Will monitor.  Resume home meds as needed    Hypoalbuminemia Hypocalcemia - Correct electrolytes as needed   Thyroid nodule - 1.7 cm right thyroid nodule seen on chest CT.  Outpatient follow-up for thyroid ultrasound - TSH within normal limits   Weakness PT and OT recommends HHPT, OT        Consultants: Onco, Nephro Procedures performed: ultrasound guided paracentesis with removal of 2.5 L of fluid   Disposition: Home health Diet recommendation:  Discharge Diet Orders (From admission, onward)     Start     Ordered   09/25/23 0000  Diet - low sodium heart healthy        09/25/23 1055           Carb modified diet DISCHARGE MEDICATION: Allergies as of 09/25/2023       Reactions   Allopurinol     diarrhea   Codeine Nausea And Vomiting   Other    Pain medications Altered mental status   Shellfish Allergy Diarrhea, Nausea And Vomiting   Just when eating clams Just when eating clams Just when eating clams        Medication List     STOP taking these medications    Nebivolol  HCl 20 MG Tabs   olmesartan -hydrochlorothiazide 40-12.5 MG tablet Commonly known as: Benicar  HCT   ondansetron  4 MG disintegrating tablet Commonly known as:  ZOFRAN -ODT   VITAMIN B12 PO       TAKE these medications    aspirin  81 MG chewable tablet Chew 81 mg by mouth daily.   feeding supplement Liqd Take 237 mLs by mouth 2 (two) times daily between meals.   midodrine  5 MG tablet Commonly known as: PROAMATINE  Take 1 tablet (5 mg total) by mouth 3 (three) times daily with meals for 5 days.   rosuvastatin  5 MG tablet Commonly known as: CRESTOR  TAKE 1 TABLET(5 MG) BY MOUTH DAILY   sertraline  50 MG tablet Commonly known as: ZOLOFT  Take 1.5 tablets (75 mg total) by mouth daily.   Slow Fe 142 (45 Fe) MG Tbcr Generic drug: Ferrous Sulfate  Take 1 tablet by mouth  daily. Take with a little orange juice.   Vitamin D3 50 MCG (2000 UT) Tabs Take 2,000 Units by mouth daily.        Follow-up Information     Lemar Pyles, NP. Schedule an appointment as soon as possible for a visit in 1 week(s).   Specialty: Nurse Practitioner Why: [facility] Discharge F/UP  appt on 09/28/23 @2 :20pm Contact information: [address] [place] [address] [phone]         Timmy Forbes, MD. Schedule an appointment as soon as possible for a visit in 3 day(s).   Specialty: Oncology Why: [facility] Discharge F/UP  MD will call back with appt. date Contact information: [address] [place] [address] [phone]         Amedeo Jupiter, MD. Schedule an appointment as soon as possible for a visit in 2 week(s).   Specialty: Endocrinology Why: [facility] Discharge F/UP  appt. on 12/16/23 @4 :30pm Contact information: [address] [facility] [place] [address] [phone]         Rica Chalet, MD. Schedule an appointment as soon as possible for a visit in 3 week(s).   Specialty: Nephrology Why: [facility] Discharge F/UP  appt. on 09/30/23 @1 :30pm Contact information: [address] [place] [address] [phone]                Discharge Exam: Cleavon Curls Weights   09/21/23 1212  Weight: 58.7 kg   General exam: Appears calm and comfortable, NAD  Respiratory system: No work of breathing, symmetric chest wall expansion Cardiovascular system: S1 & S2 heard, RRR.  Gastrointestinal system: Abdomen is soft, benign Neuro: Alert and oriented. No focal neurological deficits. Extremities: Symmetric, expected ROM.  No pedal edmea Skin: No rashes, lesions Psychiatry: Demonstrates appropriate judgement and insight. Mood & affect appropriate for situation.   Condition at discharge: fair  The results of significant diagnostics from this hospitalization  (including imaging, microbiology, ancillary and laboratory) are listed below for reference.   Imaging Studies: US  Paracentesis Result Date: 09/23/2023 INDICATION: 78 year old female with a history of CKD IIIa, breast cancer, and currently undergoing workup for multiple myeloma. She presented to the ED due to lower extremity swelling and volume overload. Request for diagnostic and therapeutic paracentesis. EXAM: ULTRASOUND GUIDED right PARACENTESIS MEDICATIONS: 1% lidocaine , 7 mL COMPLICATIONS: None immediate. PROCEDURE: Informed written consent was obtained from the patient after a discussion of the risks, benefits and alternatives to treatment. A timeout was performed prior to the initiation of the procedure. Initial ultrasound scanning demonstrates a large amount of ascites within the right lower abdominal quadrant. The right lower abdomen was prepped and draped in the usual sterile fashion. 1% lidocaine   was used for local anesthesia. Following this, a 19 gauge, 7-cm, Yueh catheter was introduced. An ultrasound image was saved for documentation purposes. The paracentesis was performed. The catheter was removed and a dressing was applied. The patient tolerated the procedure well without immediate post procedural complication. FINDINGS: A total of approximately 3.5 L of amber fluid was removed. Samples were sent to the laboratory as requested by the clinical team. IMPRESSION: Successful ultrasound-guided paracentesis yielding 3.5 liters of peritoneal fluid. Procedure performed by: Estella Helling, PA-C under the supervision of Dr. Baldemar Lev Electronically Signed   By: Fernando Hoyer M.D.   On: 09/23/2023 15:40   ECHOCARDIOGRAM COMPLETE Result Date: 09/23/2023    ECHOCARDIOGRAM REPORT   Patient Name:   JOLEE CRITCHER [facility] Date of Exam: 09/22/2023 Medical Rec #:  [id number]           Height:       54.0 in Accession #:    [id number]          Weight:       129.4 lb Date of Birth:  1945-05-05            BSA:           1.437 m Patient Age:    78 years            BP:           108/94 mmHg Patient Gender: F                   HR:           94 bpm. Exam Location:  [facility] Procedure: 2D Echo, Cardiac Doppler and Color Doppler (Both Spectral and Color            Flow Doppler were utilized during procedure). Indications:     I50.31 Acute Diastolic CHF  History:         Patient has no prior history of Echocardiogram examinations.                  Risk Factors:Hypertension. History of Breast Cancer.  Sonographer:     Brigid Canada RDCS Referring Phys:  [id number] ALEXANDRA DEZII Diagnosing Phys: Sammy Crisp MD IMPRESSIONS  1. Left ventricular ejection fraction, by estimation, is 70 to 75%. The left ventricle has hyperdynamic function. The left ventricle has no regional wall motion abnormalities. Left ventricular diastolic parameters were normal.  2. Right ventricular systolic function is normal. The right ventricular size is mildly enlarged. Tricuspid regurgitation signal is inadequate for assessing PA pressure.  3. The mitral valve is normal in structure. No evidence of mitral valve regurgitation. No evidence of mitral stenosis.  4. The aortic valve is tricuspid. There is mild thickening of the aortic valve. Aortic valve regurgitation is trivial. Aortic valve sclerosis is present, with no evidence of aortic valve stenosis. FINDINGS  Left Ventricle: Left ventricular ejection fraction, by estimation, is 70 to 75%. The left ventricle has hyperdynamic function. The left ventricle has no regional wall motion abnormalities. The left ventricular internal cavity size was normal in size. There is no left ventricular hypertrophy. Left ventricular diastolic parameters were normal. Right Ventricle: The right ventricular size is mildly enlarged. No increase in right ventricular wall thickness. Right ventricular systolic function is normal. Tricuspid regurgitation signal is inadequate for assessing PA pressure. Left Atrium: Left atrial size  was normal in size. Right Atrium: Right atrial size was normal in size. Pericardium: Trivial pericardial effusion is present. Mitral Valve: The mitral valve  is normal in structure. No evidence of mitral valve regurgitation. No evidence of mitral valve stenosis. Tricuspid Valve: The tricuspid valve is not well visualized. Tricuspid valve regurgitation is trivial. Aortic Valve: The aortic valve is tricuspid. There is mild thickening of the aortic valve. Aortic valve regurgitation is trivial. Aortic valve sclerosis is present, with no evidence of aortic valve stenosis. Aortic valve mean gradient measures 6.5 mmHg. Aortic valve peak gradient measures 12.5 mmHg. Pulmonic Valve: The pulmonic valve was not well visualized. Pulmonic valve regurgitation is trivial. No evidence of pulmonic stenosis. Aorta: The aortic root is normal in size and structure. Pulmonary Artery: The pulmonary artery is of normal size. Venous: The inferior vena cava was not well visualized. IAS/Shunts: The interatrial septum was not well visualized.  LEFT VENTRICLE PLAX 2D LVIDd:         4.60 cm Diastology LVIDs:         2.40 cm LV e' medial:    7.89 cm/s LV PW:         0.80 cm LV E/e' medial:  6.2 LV IVS:        0.80 cm LV e' lateral:   12.20 cm/s                        LV E/e' lateral: 4.0  RIGHT VENTRICLE RV Basal diam:  3.70 cm RV S prime:     34.22 cm/s TAPSE (M-mode): 3.0 cm LEFT ATRIUM             Index        RIGHT ATRIUM           Index LA diam:        4.40 cm 3.06 cm/m   RA Area:     12.10 cm LA Vol (A2C):   24.2 ml 16.83 ml/m  RA Volume:   24.20 ml  16.83 ml/m LA Vol (A4C):   25.7 ml 17.88 ml/m LA Biplane Vol: 25.7 ml 17.88 ml/m  AORTIC VALVE AV Vmax:           176.46 cm/s AV Vmean:          120.312 cm/s AV VTI:            0.297 m AV Peak Grad:      12.5 mmHg AV Mean Grad:      6.5 mmHg LVOT Vmax:         163.67 cm/s LVOT Vmean:        109.667 cm/s LVOT VTI:          0.259 m LVOT/AV VTI ratio: 0.87  AORTA Ao Root diam: 3.30 cm MITRAL  VALVE MV Area (PHT): 4.40 cm    SHUNTS MV Decel Time: 173 msec    Systemic VTI: 0.26 m MV E velocity: 48.80 cm/s MV A velocity: 93.40 cm/s MV E/A ratio:  0.52 Veryl Gottron End MD Electronically signed by Sammy Crisp MD Signature Date/Time: 09/23/2023/7:14:12 AM    Final    US  ASCITES (ABDOMEN LIMITED) Result Date: 09/22/2023 CLINICAL DATA:  Ascites. EXAM: LIMITED ABDOMEN ULTRASOUND FOR ASCITES TECHNIQUE: Limited ultrasound survey for ascites was performed in all four abdominal quadrants. COMPARISON:  CT 09/13/2023 FINDINGS: Moderate to large volume of ascites, greatest in the pelvis and left upper quadrant. IMPRESSION: Moderate to large volume abdominopelvic ascites. Electronically Signed   By: Chadwick Colonel M.D.   On: 09/22/2023 19:10   US  RENAL Result Date: 09/22/2023 CLINICAL DATA:  Acute kidney injury EXAM: RENAL /  URINARY TRACT ULTRASOUND COMPLETE COMPARISON:  None Available. FINDINGS: Right Kidney: Renal measurements: 9.3 x 4.1 x 4.6 cm = volume: 90 mL. Echogenicity is increased. No mass or hydronephrosis visualized. Left Kidney: Renal measurements: 8.1 x 4.1 x 2.7 cm = volume: 43 mL. Echogenicity is increased. No mass or hydronephrosis visualized. Bladder: Nondistended. Other: None. IMPRESSION: 1. No hydronephrosis. 2. Increased echogenicity of the kidneys bilaterally consistent with medical renal disease. Electronically Signed   By: Tyron Gallon M.D.   On: 09/22/2023 19:10   US  Venous Img Lower Bilateral Result Date: 09/21/2023 CLINICAL DATA:  Bilateral lower extremity swelling EXAM: BILATERAL LOWER EXTREMITY VENOUS DOPPLER ULTRASOUND TECHNIQUE: Gray-scale sonography with graded compression, as well as color Doppler and duplex ultrasound were performed to evaluate the lower extremity deep venous systems from the level of the common femoral vein and including the common femoral, femoral, profunda femoral, popliteal and calf veins including the posterior tibial, peroneal and gastrocnemius veins  when visible. The superficial great saphenous vein was also interrogated. Spectral Doppler was utilized to evaluate flow at rest and with distal augmentation maneuvers in the common femoral, femoral and popliteal veins. COMPARISON:  None Available. FINDINGS: RIGHT LOWER EXTREMITY Common Femoral Vein: No evidence of thrombus. Normal compressibility, respiratory phasicity and response to augmentation. Saphenofemoral Junction: No evidence of thrombus. Normal compressibility and flow on color Doppler imaging. Profunda Femoral Vein: No evidence of thrombus. Normal compressibility and flow on color Doppler imaging. Femoral Vein: No evidence of thrombus. Normal compressibility, respiratory phasicity and response to augmentation. Popliteal Vein: No evidence of thrombus. Normal compressibility, respiratory phasicity and response to augmentation. Calf Veins: No evidence of thrombus. Normal compressibility and flow on color Doppler imaging. Superficial Great Saphenous Vein: No evidence of thrombus. Normal compressibility. Venous Reflux:  None. Other Findings:  None. LEFT LOWER EXTREMITY Common Femoral Vein: No evidence of thrombus. Normal compressibility, respiratory phasicity and response to augmentation. Saphenofemoral Junction: No evidence of thrombus. Normal compressibility and flow on color Doppler imaging. Profunda Femoral Vein: No evidence of thrombus. Normal compressibility and flow on color Doppler imaging. Femoral Vein: No evidence of thrombus. Normal compressibility, respiratory phasicity and response to augmentation. Popliteal Vein: No evidence of thrombus. Normal compressibility, respiratory phasicity and response to augmentation. Calf Veins: No evidence of thrombus. Normal compressibility and flow on color Doppler imaging. Superficial Great Saphenous Vein: No evidence of thrombus. Normal compressibility. Venous Reflux:  None. Other Findings:  None. IMPRESSION: No evidence of deep venous thrombosis in either lower  extremity. Electronically Signed   By: Fernando Hoyer M.D.   On: 09/21/2023 13:24   DG Chest 1 View Result Date: 09/21/2023 CLINICAL DATA:  Shortness of breath and chest pain. EXAM: CHEST  1 VIEW COMPARISON:  Chest CT yesterday FINDINGS: Stable cardiomegaly. Aortic tortuosity. Left pleural effusion is unchanged allowing for differences in modality. The right pleural effusion on CT is not well seen by radiograph. No new airspace disease. No pulmonary edema. No pneumothorax. Scoliotic curvature of spine. IMPRESSION: 1. Unchanged left pleural effusion. The right pleural effusion on CT is not well seen by radiograph. 2. Stable cardiomegaly and aortic tortuosity. Electronically Signed   By: Chadwick Colonel M.D.   On: 09/21/2023 13:22   CT CHEST W CONTRAST Result Date: 09/20/2023 CLINICAL DATA:  History of left-sided breast cancer status post mastectomy. Lytic lesions on abdomen/pelvis CT 09/13/2023. EXAM: CT CHEST WITH CONTRAST TECHNIQUE: Multidetector CT imaging of the chest was performed during intravenous contrast administration. RADIATION DOSE REDUCTION: This exam was performed according to  the departmental dose-optimization program which includes automated exposure control, adjustment of the mA and/or kV according to patient size and/or use of iterative reconstruction technique. CONTRAST:  50mL OMNIPAQUE  IOHEXOL  300 MG/ML  SOLN COMPARISON:  None Available. FINDINGS: Cardiovascular: Heart size upper normal. No substantial pericardial effusion. Mild atherosclerotic calcification is noted in the wall of the thoracic aorta. Enlargement of the pulmonary outflow tract/main pulmonary arteries suggests pulmonary arterial hypertension. Mediastinum/Nodes: 1.7 cm right thyroid nodule evident. No mediastinal lymphadenopathy. No gross colonic mass. There is no hilar lymphadenopathy. Small to moderate hiatal hernia. The esophagus has normal imaging features. There is no axillary lymphadenopathy. Lungs/Pleura:  Centrilobular emphsyema noted. No suspicious pulmonary nodule or mass. 3 mm left upper lobe nodule identified on 34/4. Minimal subsegmental atelectasis noted in both lower lungs. Small left and tiny right pleural effusions. Upper Abdomen: Moderate ascites noted in the visualized upper abdomen. Heterogeneous enhancement of liver parenchyma with 6 mm low-density anterior subcapsular liver lesion noted, similar to recent abdomen CT. Musculoskeletal: Heterogeneous mineralization diffusely in the visualized bony anatomy. No overtly concerning lytic or sclerotic osseous abnormality within the visualized bony anatomy of the thorax. Degenerative changes are noted in both shoulders, right greater than left. IMPRESSION: 1. No evidence for primary malignancy in the chest. 2. Small left and tiny right pleural effusions with minimal subsegmental atelectasis in both lower lungs. 3. Enlargement of the pulmonary outflow tract/main pulmonary arteries suggests pulmonary arterial hypertension. 4. Bones are heterogeneously mineralized without a dominant suspicious lytic or sclerotic osseous lesion evident. 5. Moderate ascites in the visualized upper abdomen. 6. Heterogeneous enhancement of liver parenchyma with 6 mm low-density anterior subcapsular liver lesion, similar to recent abdomen CT. 7. 1.7 cm right thyroid nodule. Recommend thyroid US  (ref: J Am Coll Radiol. 2015 Feb;12(2): 143-50). 8. Aortic Atherosclerosis (ICD10-I70.0) and Emphysema (ICD10-J43.9). Electronically Signed   By: Donnal Fusi M.D.   On: 09/20/2023 08:53   DG Chest Port 1 View Result Date: 09/18/2023 CLINICAL DATA:  Weakness and hypotension. EXAM: PORTABLE CHEST 1 VIEW COMPARISON:  11/03/2011 FINDINGS: Shallow inspiration. Linear atelectasis in the lung bases. Mild cardiac enlargement. No vascular congestion, edema, or consolidation. No pleural effusion or pneumothorax. Mediastinal contours appear intact. Degenerative changes in the spine and shoulders.  IMPRESSION: Shallow inspiration with atelectasis in the lung bases. Electronically Signed   By: Boyce Byes M.D.   On: 09/18/2023 18:05   CT ABDOMEN PELVIS WO CONTRAST Result Date: 09/13/2023 CLINICAL DATA:  Abdominal pain, acute, nonlocalized EXAM: CT ABDOMEN AND PELVIS WITHOUT CONTRAST TECHNIQUE: Multidetector CT imaging of the abdomen and pelvis was performed following the standard protocol without IV contrast. RADIATION DOSE REDUCTION: This exam was performed according to the departmental dose-optimization program which includes automated exposure control, adjustment of the mA and/or kV according to patient size and/or use of iterative reconstruction technique. COMPARISON:  PET CT 11/05/2011 FINDINGS: Lower chest: No acute abnormality.  Small hiatal hernia. Hepatobiliary: Stable chronic 6 mm hepatic lobe lesion (2:21). No gallstones, gallbladder wall thickening, or pericholecystic fluid. No biliary dilatation. Pancreas: No focal lesion. Normal pancreatic contour. No surrounding inflammatory changes. No main pancreatic ductal dilatation. Spleen: Normal in size without focal abnormality. Adrenals/Urinary Tract: No adrenal nodule bilaterally. No nephrolithiasis and no hydronephrosis. Indeterminate 8 mm hyperdense lesion of the left kidney (2:19)-given hyperdensity finding likely represents a proteinaceous or hemorrhagic cyst-no further follow-up indicated. Subcentimeter hypodensities are too small to characterize-no further follow-up indicated. No ureterolithiasis or hydroureter. The urinary bladder is unremarkable. Stomach/Bowel: Stomach is within normal limits.  No evidence of bowel wall thickening or dilatation. The appendix is not definitely identified with no inflammatory changes in the right lower quadrant to suggest acute appendicitis. Vascular/Lymphatic: No abdominal aorta or iliac aneurysm. Mild atherosclerotic plaque of the aorta and its branches. No abdominal, pelvic, or inguinal lymphadenopathy.  Reproductive: Uterus and bilateral adnexa are unremarkable. Other: Vsimple free fluid. No intraperitoneal free gas. No organized fluid collection. Musculoskeletal: Interval development of a 3 x 2.5 cm well-circumscribed fluid density lesion just deep of the right gluteal musculature and lateral to the ischial tuberosity (2:77). Small volume ascites containing umbilical hernia. Small fat containing right inguinal hernia. Status post left mastectomy. Diffusely heterogeneous axial and appendicular skeleton with multiple underlying lytic lesions. No acute displaced fracture. Multilevel degenerative changes of the spine. 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 Bilateral hip degenerative changes. IMPRESSION: 1. Small hiatal hernia. 2. Small to moderate volume simple ascites. 3. Diffusely heterogeneous axial and appendicular skeleton with multiple underlying lytic lesions. Underlying metastatic lesions versus multiple myeloma not excluded. 4. Interval development of an indeterminate 3 cm well-circumscribed fluid density lesion just deep to the right gluteal musculature and lateral to the distal tuberosity. 5. Otherwise limited evaluation on this noncontrast study. 6.  Aortic Atherosclerosis (ICD10-I70.0). Electronically Signed   By: Morgane  Naveau M.D.   On: 09/13/2023 17:25    Microbiology: Results for orders placed or performed during the hospital encounter of 09/21/23  Fungus Culture With Stain     Status: None (Preliminary result)   Collection Time: 09/23/23 10:32 AM   Specimen: PATH Cytology Peritoneal fluid  Result Value Ref Range Status   Fungus Stain Final report  Final    Comment: (NOTE) Performed At: [facility] [address] [place], [place] [id number] Pearlean Botts MD [id number]    Fungus (Mycology) Culture PENDING  Incomplete   Fungal Source CYTO PERI  Final    Comment: Performed at [facility], [address] [place], [place] [address]  Acid Fast Smear (AFB)     Status: None   Collection Time: 09/23/23 10:32 AM   Specimen: PATH Cytology Peritoneal fluid  Result Value Ref Range Status   AFB Specimen Processing Concentration  Final   Acid Fast Smear Negative  Final    Comment: (NOTE) Performed At: [facility] [address] [place], [place] [id number] Pearlean Botts MD [id number]    Source (AFB) CYTO PERI  Final    Comment: Performed at [facility], [address] [place], [place] [address]  Anaerobic culture w Gram Stain     Status: None (Preliminary result)   Collection Time: 09/23/23 10:32 AM   Specimen: Fluid  Result Value Ref Range Status   Specimen Description FLUID PERITONEAL  Final   Special Requests NONE  Final   Gram Stain   Final    FEW WBC PRESENT,BOTH PMN AND MONONUCLEAR NO ORGANISMS SEEN Performed at [facility], [address] [place], [place] [address]    Culture   Final    NO ANAEROBES ISOLATED; CULTURE IN PROGRESS FOR 5 DAYS   Report Status PENDING  Incomplete  Fungus Culture Result     Status: None   Collection Time: 09/23/23 10:32 AM  Result Value Ref Range Status   Result 1 Comment  Final    Comment: (NOTE) KOH/Calcofluor preparation:  no fungus observed. Performed At: [facility] [address] [employer], [place] [id number] Nagendra Sanjai  MD UJ:[id number]   Body fluid culture w Gram Stain     Status: None   Collection Time: 09/23/23 10:47 AM   Specimen: PATH Cytology Peritoneal fluid  Result Value Ref Range Status   Specimen Description   Final    FLUID Performed at [facility], [address] [place], [place] [address]    Special Requests PERITONEAL  Final   Gram Stain   Final    FEW WBC PRESENT,BOTH PMN AND MONONUCLEAR NO ORGANISMS SEEN    Culture   Final    NO GROWTH 3 DAYS Performed at [facility], [address] [place], [place] [address]    Report  Status 09/27/2023 FINAL  Final    Labs: CBC: Recent Labs  Lab 09/21/23 1229 09/21/23 1526 09/22/23 0506 09/23/23 0444 09/24/23 0439 09/25/23 0527  WBC 12.9* 14.0* 9.7 10.9* 10.4 11.2*  NEUTROABS 10.8*  --  7.6 8.6* 8.2* 9.0*  HGB 11.3* 11.4* 9.5* 10.6* 10.3* 10.4*  HCT 36.0 36.5 30.2* 33.3* 32.7* 32.5*  MCV 90.7 90.6 89.1 89.5 89.6 88.3  PLT 437* 466* 357 369 324 318   Basic Metabolic Panel: Recent Labs  Lab 09/22/23 0506 09/22/23 1749 09/23/23 0444 09/24/23 0439 09/25/23 0527  NA 136 134* 134* 134* 136  K 3.8 3.7 3.7 3.8 3.7  CL 107 107 106 108 110  CO2 19* 16* 17* 18* 18*  GLUCOSE 95 104* 106* 96 100*  BUN 31* 34* 36* 41* 45*  CREATININE 1.92* 2.35* 2.61* 2.85* 2.67*  CALCIUM  7.9* 8.0* 8.1* 7.7* 7.6*  MG 2.1  --  2.0 2.0 1.8  PHOS 3.1  --  3.2 3.1 2.8   Liver Function Tests: Recent Labs  Lab 09/22/23 0506 09/22/23 1749 09/23/23 0444 09/24/23 0439 09/25/23 0527  AST 18 20 21 21 19   ALT 12 13 13 13 13   ALKPHOS 100 112 116 100 100  BILITOT 0.7 0.6 0.7 0.7 0.7  PROT 5.1* 5.5* 5.6* 5.0* 4.9*  ALBUMIN 2.3* 2.5* 2.5* 2.1* 2.0*   CBG: No results for input(s): "GLUCAP" in the last 168 hours.  Discharge time spent: greater than 30 minutes.  Signed: Brenna Cam, MD [facility] 09/27/2023

## 2023-09-28 ENCOUNTER — Encounter: Payer: Self-pay | Admitting: Nurse Practitioner

## 2023-09-28 ENCOUNTER — Ambulatory Visit: Admitting: Nurse Practitioner

## 2023-09-28 ENCOUNTER — Telehealth: Payer: Self-pay

## 2023-09-28 VITALS — BP 105/71 | HR 98 | Temp 98.3°F | Ht <= 58 in | Wt 118.4 lb

## 2023-09-28 DIAGNOSIS — N179 Acute kidney failure, unspecified: Secondary | ICD-10-CM

## 2023-09-28 DIAGNOSIS — M899 Disorder of bone, unspecified: Secondary | ICD-10-CM

## 2023-09-28 DIAGNOSIS — E861 Hypovolemia: Secondary | ICD-10-CM | POA: Diagnosis not present

## 2023-09-28 DIAGNOSIS — Z853 Personal history of malignant neoplasm of breast: Secondary | ICD-10-CM

## 2023-09-28 DIAGNOSIS — R188 Other ascites: Secondary | ICD-10-CM | POA: Diagnosis not present

## 2023-09-28 DIAGNOSIS — D649 Anemia, unspecified: Secondary | ICD-10-CM

## 2023-09-28 DIAGNOSIS — E041 Nontoxic single thyroid nodule: Secondary | ICD-10-CM

## 2023-09-28 LAB — ANAEROBIC CULTURE W GRAM STAIN

## 2023-09-28 NOTE — Telephone Encounter (Signed)
 Please schedule MD, a week after PET and notify pt of appt details.

## 2023-09-28 NOTE — Assessment & Plan Note (Signed)
 In 2013 to left breast, she obtains annual mammograms, continue this.  Needs new prosthesis and bras as current ones are old and tattered, will order these.

## 2023-09-28 NOTE — Progress Notes (Signed)
 BP 105/71   Pulse 98   Temp 98.3 F (36.8 C) (Oral)   Ht 4\' 6"  (1.372 m)   Wt 118 lb 6.4 oz (53.7 kg)   SpO2 98%   BMI 28.55 kg/m    Subjective:    Patient ID: Felicia Robinson, female    DOB: [birth year], 78 y.o.   MRN: [id number]  HPI: Felicia Robinson is a 78 y.o. female  Chief Complaint  Patient presents with   Hospitalization Follow-up   Son at bedside  Transition of Care Hospital Follow up.  Admitted on 09/21/23 due to SOB, edema, and abdominal distension & discharged on 09/25/23.  Has recently been discharged from hospital prior to this on 09/20/23 when diffuse lytic lesions were noted on abdominal CT, which is assumed to be malignancy.  When attended hospital follow-up on 09/21/23 was noted to be fluid overloaded and recommended to go to ER.  On hospital admission she was noted to have ascites and paracentesis was performed on 09/23/23, removing 3.5 L. She is to follow-up outpatient with oncology and is scheduled for next Monday.  PET scan scheduled for tomorrow. 1.7 cm right thyroid nodule, noted on chest CT during last hospitalization with recommendation for ultrasound.  Has history of left breast cancer in July 2013 and reports is in need of new prosthesis and bras as current ones are getting old and tattered.  Today she reports feeling a little bit better, but weak. Sleeping more often.  Appetite slightly returned, but not 100%.  Continues to have diarrhea. Overall abdomen feels better per patient with no distension.  To have home health, but has not heard from anyone to schedule.  "Hospital Course: Assessment and Plan:   79 y.o. female with medical history significant of stage IIIa CKD, X-linked hypophosphatemic rickets follows with [facility], osteoporosis, anemia, B12 deficiency, grief reaction of sertraline , mixed hyperlipidemia, vitamin D  deficiency, history of breast cancer, hypertension. Patient was admitted to the hospital 5/30 through 6/1 with prerenal dehydration induced AKI  secondary to nausea, vomiting, diarrhea.  While admitted she received 24 hours of IV fluids.  At time of discharge kidney function was gradually improving and patient had improved to her physiologic baseline. When patient returned home she noted increased swelling in her lower extremities and at PCP follow-up today she was found to be overtly volume overloaded.  It was recommended she return to the ER.   Of note patient has recently diagnosed lytic lesions.  Workup is ongoing at this time.  Multiple myeloma panel has been sent and is still pending.  Patient has outpatient follow-up with oncology and scheduled PET scan as an outpt. On arrival to the ED this admission patient is found to have 1+ pitting edema in her lower extremities, and abdominal distention.  She reports she is currently 13 pounds above her normal weight.  Labs revealed BNP 187, WBC 12.9, creatinine 1.7.  Chest x-ray with small left pleural effusion, mild cardiomegaly.  Lower extremity Dopplers are negative for DVT.  Patient is stable on room air but endorses abdominal discomfort due to the swelling.  She denies any chest pain.  She has had no fever.     6/4: Palliative care and nephrology consult.  Ultrasound-guided paracentesis with 3.5 L of fluid removal 6/5: PT and OT eval, discontinue telemetry, stopping/holding aspirin  as she had/maroon stool.   Volume overload on admission - Diuresed initially - echo shows normal function - Small pleural effusion on CXR, stable since prior admission  AKI on CKD 3a - Recently admitted with prerenal injury secondary to vomiting, nausea, anorexia.  Creatinine down trended to 1.23 on 6/1, up trended to 1.7 at admission.  Patient did receive CT contrast on 6/1 prior to discharge - Creatinine continues to rise, BUN & creat likely due to contrast exposure and Diuresis - Hold Diuresis and function improved.   Ascites - s/p ultrasound guided paracentesis with removal of 2.5 L of fluid - fluid seems  exudative in nature, outpt f/up with Onco   Diffuse lytic lesions - Presumed malignancy,  unable to find primary source at this time.  Lytic lesions difficult to truly characterize in setting of profound osteopenia. - - No heart failure and normal liver function.  Cell lines mostly stable, do not suspect the bone marrow biopsy would be immediately helpful.  Fluid density lesion of the right gluteal musculature appears to have simple fluid collection, not likely malignant per radiology review. -CT abdomen pelvis 5/25: Chronic 6 mm hepatic lobe lesion, stable in size.  3 x 2.5 cm well-circumscribed fluid density lesion above the right gluteal musculature, small volume ascites containing umbilical hernia, small fat-containing right inguinal hernia.  Diffusely heterogenous axial and appendicular skeleton with multiple underlying lytic lesions, redemonstration of a chronic S3 sacral foramina fluid density lesion expanding diaphragm and is suggestive of a Tarlov cyst. - Patient has established follow-up with Dr. [facility].  - Multiple myeloma panel not suggestive of myeloma.  Cannot rule out nonsecretory myeloma per oncology - Peripheral smear review was unremarkable WBC morphology - Chest CT without primary lesion.  No significant adenopathy - Outpatient PET scan per oncology,  If any bone lesions are hypermetabolic she will need biopsy as an outpatient   Nausea, vomiting - Resolved at this time.  Has been chronic and intermittent for the last 3 weeks - Workup on prior admission reveals COVID/RSV/flu negative.  TSH and hemoglobin A1c unremarkable - She has been tolerating her diet for now   Chronic diarrhea - Patient endorses intermittent diarrhea for the last many months - GI PCR negative - Would benefit from outpatient GI follow-up.   Leukocytosis - WBC 12.9 on arrival.  No infectious signs or symptoms - Was previously elevated on prior admission and downtrended without intervention.  May be  reactive vs malignancy associated - Urine culture 5/25 with 30,000 colonies of lactobacillus.  Patient completed course of Keflex.  Denies any urinary symptoms - Chest x-ray without infiltrates   Thrombocytosis - Platelets 437 on arrival, 472 on 5/30 which downtrended to normal without intervention. - Leukocytosis and thrombocytosis likely in some part related to underlying malignancy/multiple myeloma. Outpt Onco f/up   History of breast cancer, 2013.  In remission since 2018. - Status post left mastectomy -- Has had annual mammograms without recurrence. Most recent 10/2022 WNL   X-linked hypophosphatemia rickets - Follows with [facility] - History of bilateral femoral stress fractures 2019, tibial fracture 2021 - Receives Cryvista injections monthly.  Scheduled for next dose this week. Holding for now.   B12 deficiency History of iron deficiency anemia - Continue with iron and B12 supplementation - Hemoglobin appears near baseline at this time   Grief reaction - Continue sertraline  50mg  dose    Mixed hyperlipidemia - Continue statin   Hypertension - Pressure currently at goal without antihypertensives.  Will monitor.  Resume home meds as needed    Hypoalbuminemia Hypocalcemia - Correct electrolytes as needed   Thyroid nodule - 1.7 cm right thyroid nodule seen on  chest CT.  Outpatient follow-up for thyroid ultrasound - TSH within normal limits   Weakness PT and OT recommends HHPT, OT"   Hospital/Facility: [facility] Physician: Dr. Mason Sole D/C Date: 09/25/23  Records Requested: 09/28/23 Records Received: 09/28/23 Records Reviewed: 09/28/23  Diagnoses on Discharge: AKI & Ascites of Liver with fluid overload  Date of interactive Contact within 48 hours of discharge:  Contact was through: none  Date of 7 day or 14 day face-to-face visit:    within 7 days  Outpatient Encounter Medications as of 09/28/2023  Medication Sig   aspirin  81 MG chewable tablet Chew 81 mg by mouth  daily.   Cholecalciferol (VITAMIN D3) 50 MCG (2000 UT) TABS Take 2,000 Units by mouth daily.   feeding supplement (ENSURE PLUS HIGH PROTEIN) LIQD Take 237 mLs by mouth 2 (two) times daily between meals.   Ferrous Sulfate  (SLOW FE) 142 (45 Fe) MG TBCR Take 1 tablet by mouth daily. Take with a little orange juice.   midodrine  (PROAMATINE ) 5 MG tablet Take 1 tablet (5 mg total) by mouth 3 (three) times daily with meals for 5 days.   rosuvastatin  (CRESTOR ) 5 MG tablet TAKE 1 TABLET(5 MG) BY MOUTH DAILY   sertraline  (ZOLOFT ) 50 MG tablet Take 1.5 tablets (75 mg total) by mouth daily.   No facility-administered encounter medications on file as of 09/28/2023.    Diagnostic Tests Reviewed/Disposition:     Latest Ref Rng & Units 09/25/2023    5:27 AM 09/24/2023    4:39 AM 09/23/2023    4:44 AM  CBC  WBC 4.0 - 10.5 K/uL 11.2  10.4  10.9   Hemoglobin 12.0 - 15.0 g/dL 84.1  32.4  40.1   Hematocrit 36.0 - 46.0 % 32.5  32.7  33.3   Platelets 150 - 400 K/uL 318  324  369        Latest Ref Rng & Units 09/25/2023    5:27 AM 09/24/2023    4:39 AM 09/23/2023    4:44 AM  CMP  Glucose 70 - 99 mg/dL 027  96  253   BUN 8 - 23 mg/dL 45  41  36   Creatinine 0.44 - 1.00 mg/dL 6.64  4.03  4.74   Sodium 135 - 145 mmol/L 136  134  134   Potassium 3.5 - 5.1 mmol/L 3.7  3.8  3.7   Chloride 98 - 111 mmol/L 110  108  106   CO2 22 - 32 mmol/L 18  18  17    Calcium  8.9 - 10.3 mg/dL 7.6  7.7  8.1   Total Protein 6.5 - 8.1 g/dL 4.9  5.0  5.6   Total Bilirubin 0.0 - 1.2 mg/dL 0.7  0.7  0.7   Alkaline Phos 38 - 126 U/L 100  100  116   AST 15 - 41 U/L 19  21  21    ALT 0 - 44 U/L 13  13  13      Consults: oncology and nephrology  Discharge Instructions:  Follow-up with outpatient providers. Follow-up with primary care to receive thyroid ultrasound to better evaluate thyroid nodule incidentally seen on chest CT  Disease/illness Education: Reviewed with son and patient today  Home Health/Community Services  Discussions/Referrals: none  Establishment or re-establishment of referral orders for community resources: none  Discussion with other health care providers: Reviewed all recent notes  Assessment and Support of treatment regimen adherence: Reviewed with son and patient today  Appointments Coordinated with: Reviewed with son and  patient today  Education for self-management, independent living, and ADLs:  Reviewed with son and patient today  Relevant past medical, surgical, family and social history reviewed and updated as indicated. Interim medical history since our last visit reviewed. Allergies and medications reviewed and updated.  Review of Systems  Constitutional:  Positive for activity change, appetite change (increased a little) and fatigue. Negative for diaphoresis and fever.  Respiratory:  Positive for shortness of breath (with short walks). Negative for cough, chest tightness and wheezing.   Cardiovascular:  Negative for chest pain, palpitations and leg swelling.  Gastrointestinal:  Positive for diarrhea. Negative for abdominal distention, abdominal pain, blood in stool, constipation, nausea and vomiting.  Neurological:  Positive for weakness. Negative for dizziness, seizures, syncope, facial asymmetry, speech difficulty, numbness and headaches.  Psychiatric/Behavioral: Negative.     Per HPI unless specifically indicated above     Objective:     BP 105/71   Pulse 98   Temp 98.3 F (36.8 C) (Oral)   Ht 4\' 6"  (1.372 m)   Wt 118 lb 6.4 oz (53.7 kg)   SpO2 98%   BMI 28.55 kg/m   Wt Readings from Last 3 Encounters:  09/28/23 118 lb 6.4 oz (53.7 kg)  09/21/23 129 lb 6.4 oz (58.7 kg)  09/21/23 129 lb 6.4 oz (58.7 kg)    Physical Exam Vitals and nursing note reviewed.  Constitutional:      General: She is awake. She is not in acute distress.    Appearance: She is well-developed and well-groomed. She is not ill-appearing or toxic-appearing.  HENT:     Head:  Normocephalic.     Right Ear: Hearing and external ear normal.     Left Ear: Hearing and external ear normal.  Eyes:     General: Lids are normal.        Right eye: No discharge.        Left eye: No discharge.     Conjunctiva/sclera: Conjunctivae normal.     Pupils: Pupils are equal, round, and reactive to light.  Neck:     Thyroid: No thyromegaly.     Vascular: No carotid bruit or JVD.  Cardiovascular:     Rate and Rhythm: Normal rate and regular rhythm.     Heart sounds: Normal heart sounds. No murmur heard.    No gallop.  Pulmonary:     Effort: Pulmonary effort is normal. No accessory muscle usage or respiratory distress.     Breath sounds: Normal breath sounds. No decreased breath sounds, wheezing or rales.  Abdominal:     General: Bowel sounds are normal. There is distension (mild, but improved from last visit).     Palpations: Abdomen is soft. There is no fluid wave.     Tenderness: There is no abdominal tenderness. There is no right CVA tenderness or left CVA tenderness.  Musculoskeletal:     Cervical back: Normal range of motion and neck supple.     Right lower leg: 1+ Pitting Edema present.     Left lower leg: 1+ Pitting Edema present.  Lymphadenopathy:     Cervical: No cervical adenopathy.  Skin:    General: Skin is warm and dry.  Neurological:     Mental Status: She is alert and oriented to person, place, and time.     Deep Tendon Reflexes: Reflexes are normal and symmetric.     Reflex Scores:      Brachioradialis reflexes are 2+ on the right side and 2+ on  the left side.      Patellar reflexes are 2+ on the right side and 2+ on the left side. Psychiatric:        Attention and Perception: Attention normal.        Mood and Affect: Mood normal.        Speech: Speech normal.        Behavior: Behavior normal. Behavior is cooperative.        Thought Content: Thought content normal.    Results for orders placed or performed during the hospital encounter of 09/21/23   Comprehensive metabolic panel   Collection Time: 09/21/23 12:29 PM  Result Value Ref Range   Sodium 135 135 - 145 mmol/L   Potassium 3.8 3.5 - 5.1 mmol/L   Chloride 106 98 - 111 mmol/L   CO2 18 (L) 22 - 32 mmol/L   Glucose, Bld 143 (H) 70 - 99 mg/dL   BUN 28 (H) 8 - 23 mg/dL   Creatinine, Ser 1.61 (H) 0.44 - 1.00 mg/dL   Calcium  8.1 (L) 8.9 - 10.3 mg/dL   Total Protein 6.1 (L) 6.5 - 8.1 g/dL   Albumin 2.7 (L) 3.5 - 5.0 g/dL   AST 23 15 - 41 U/L   ALT 13 0 - 44 U/L   Alkaline Phosphatase 120 38 - 126 U/L   Total Bilirubin 0.5 0.0 - 1.2 mg/dL   GFR, Estimated 29 (L) >60 mL/min   Anion gap 11 5 - 15  Brain natriuretic peptide   Collection Time: 09/21/23 12:29 PM  Result Value Ref Range   B Natriuretic Peptide 187.9 (H) 0.0 - 100.0 pg/mL  CBC with Differential   Collection Time: 09/21/23 12:29 PM  Result Value Ref Range   WBC 12.9 (H) 4.0 - 10.5 K/uL   RBC 3.97 3.87 - 5.11 MIL/uL   Hemoglobin 11.3 (L) 12.0 - 15.0 g/dL   HCT 09.6 04.5 - 40.9 %   MCV 90.7 80.0 - 100.0 fL   MCH 28.5 26.0 - 34.0 pg   MCHC 31.4 30.0 - 36.0 g/dL   RDW 81.1 91.4 - 78.2 %   Platelets 437 (H) 150 - 400 K/uL   nRBC 0.0 0.0 - 0.2 %   Neutrophils Relative % 84 %   Neutro Abs 10.8 (H) 1.7 - 7.7 K/uL   Lymphocytes Relative 7 %   Lymphs Abs 1.0 0.7 - 4.0 K/uL   Monocytes Relative 7 %   Monocytes Absolute 0.9 0.1 - 1.0 K/uL   Eosinophils Relative 1 %   Eosinophils Absolute 0.2 0.0 - 0.5 K/uL   Basophils Relative 0 %   Basophils Absolute 0.0 0.0 - 0.1 K/uL   Immature Granulocytes 1 %   Abs Immature Granulocytes 0.08 (H) 0.00 - 0.07 K/uL  Protime-INR   Collection Time: 09/21/23 12:29 PM  Result Value Ref Range   Prothrombin Time 15.2 11.4 - 15.2 seconds   INR 1.2 0.8 - 1.2  Troponin I (High Sensitivity)   Collection Time: 09/21/23 12:29 PM  Result Value Ref Range   Troponin I (High Sensitivity) 10 <18 ng/L  CBC   Collection Time: 09/21/23  3:26 PM  Result Value Ref Range   WBC 14.0 (H) 4.0 -  10.5 K/uL   RBC 4.03 3.87 - 5.11 MIL/uL   Hemoglobin 11.4 (L) 12.0 - 15.0 g/dL   HCT 95.6 21.3 - 08.6 %   MCV 90.6 80.0 - 100.0 fL   MCH 28.3 26.0 - 34.0 pg   MCHC 31.2  30.0 - 36.0 g/dL   RDW 81.1 91.4 - 78.2 %   Platelets 466 (H) 150 - 400 K/uL   nRBC 0.0 0.0 - 0.2 %  Creatinine, serum   Collection Time: 09/21/23  3:26 PM  Result Value Ref Range   Creatinine, Ser 1.80 (H) 0.44 - 1.00 mg/dL   GFR, Estimated 29 (L) >60 mL/min  Troponin I (High Sensitivity)   Collection Time: 09/21/23  3:26 PM  Result Value Ref Range   Troponin I (High Sensitivity) 10 <18 ng/L  Magnesium    Collection Time: 09/22/23  5:06 AM  Result Value Ref Range   Magnesium  2.1 1.7 - 2.4 mg/dL  CBC with Differential/Platelet   Collection Time: 09/22/23  5:06 AM  Result Value Ref Range   WBC 9.7 4.0 - 10.5 K/uL   RBC 3.39 (L) 3.87 - 5.11 MIL/uL   Hemoglobin 9.5 (L) 12.0 - 15.0 g/dL   HCT 95.6 (L) 21.3 - 08.6 %   MCV 89.1 80.0 - 100.0 fL   MCH 28.0 26.0 - 34.0 pg   MCHC 31.5 30.0 - 36.0 g/dL   RDW 57.8 46.9 - 62.9 %   Platelets 357 150 - 400 K/uL   nRBC 0.0 0.0 - 0.2 %   Neutrophils Relative % 78 %   Neutro Abs 7.6 1.7 - 7.7 K/uL   Lymphocytes Relative 11 %   Lymphs Abs 1.0 0.7 - 4.0 K/uL   Monocytes Relative 8 %   Monocytes Absolute 0.8 0.1 - 1.0 K/uL   Eosinophils Relative 2 %   Eosinophils Absolute 0.2 0.0 - 0.5 K/uL   Basophils Relative 0 %   Basophils Absolute 0.0 0.0 - 0.1 K/uL   Immature Granulocytes 1 %   Abs Immature Granulocytes 0.05 0.00 - 0.07 K/uL  Phosphorus   Collection Time: 09/22/23  5:06 AM  Result Value Ref Range   Phosphorus 3.1 2.5 - 4.6 mg/dL  Comprehensive metabolic panel with GFR   Collection Time: 09/22/23  5:06 AM  Result Value Ref Range   Sodium 136 135 - 145 mmol/L   Potassium 3.8 3.5 - 5.1 mmol/L   Chloride 107 98 - 111 mmol/L   CO2 19 (L) 22 - 32 mmol/L   Glucose, Bld 95 70 - 99 mg/dL   BUN 31 (H) 8 - 23 mg/dL   Creatinine, Ser 5.28 (H) 0.44 - 1.00 mg/dL    Calcium  7.9 (L) 8.9 - 10.3 mg/dL   Total Protein 5.1 (L) 6.5 - 8.1 g/dL   Albumin 2.3 (L) 3.5 - 5.0 g/dL   AST 18 15 - 41 U/L   ALT 12 0 - 44 U/L   Alkaline Phosphatase 100 38 - 126 U/L   Total Bilirubin 0.7 0.0 - 1.2 mg/dL   GFR, Estimated 27 (L) >60 mL/min   Anion gap 10 5 - 15  Comprehensive metabolic panel   Collection Time: 09/22/23  5:49 PM  Result Value Ref Range   Sodium 134 (L) 135 - 145 mmol/L   Potassium 3.7 3.5 - 5.1 mmol/L   Chloride 107 98 - 111 mmol/L   CO2 16 (L) 22 - 32 mmol/L   Glucose, Bld 104 (H) 70 - 99 mg/dL   BUN 34 (H) 8 - 23 mg/dL   Creatinine, Ser 4.13 (H) 0.44 - 1.00 mg/dL   Calcium  8.0 (L) 8.9 - 10.3 mg/dL   Total Protein 5.5 (L) 6.5 - 8.1 g/dL   Albumin 2.5 (L) 3.5 - 5.0 g/dL   AST 20 15 -  41 U/L   ALT 13 0 - 44 U/L   Alkaline Phosphatase 112 38 - 126 U/L   Total Bilirubin 0.6 0.0 - 1.2 mg/dL   GFR, Estimated 21 (L) >60 mL/min   Anion gap 11 5 - 15  ECHOCARDIOGRAM COMPLETE   Collection Time: 09/22/23  7:10 PM  Result Value Ref Range   Weight 2,070.38 oz   Height 54 in   BP 117/79 mmHg   S' Lateral 2.40 cm   Area-P 1/2 4.40 cm2   Est EF 70 - 75%    Ao pk vel 1.76 m/s   AV Peak grad 12.5 mmHg   AV Mean grad 6.5 mmHg  Cytology - Non PAP;   Collection Time: 09/23/23 12:00 AM  Result Value Ref Range   CYTOLOGY - NON GYN      CYTOLOGY - NON PAP [facility] [address] [place], [place] [address] Telephone [phone] or [phone] Fax [phone]  CYTOPATHOLOGY REPORT   Accession #: [id number] Patient Name: TYKERIA, WAWRZYNIAK Visit # : [id number]  MRN: [id number] Physician: Roise Cleaver DOB/Age 04-08-1946 (Age: 38) Gender: F Collected Date: 09/23/2023 Received Date: 09/23/2023  FINAL DIAGNOSIS STATEMENT Of SPECIMEN ADEQUACY:  INTERPRETATION(S):      NEGATIVE FOR MALIGNANCY      ACUTE AND CHRONIC INFLAMMATORY CELLS WITH SCATTERED BENIGN/REACTIVE MESOTHELIAL      CELLS WITHIN A SEROUS  BACKGROUND      DATE SIGNED OUT: 09/24/2023 ELECTRONIC SIGNATURE : Picklesimer Md, Aron Lard , Sports administrator, Electronic Signature   CASE COMMENTS   CLINICAL HISTORY  SOURCE OF SPECIMEN(S) Peritoneal/Ascitic Fluid  SPECIMEN COMMENTS: SPECIMEN CLINICAL INFORMATION:    Gross Description Specimen: Received is/are 1000cc of yellow fluid in an evacuated containe r and one slide      Prepared:      # Smears: 1      # Concentration Technique Slides (i.e. ThinPrep): 1      # Cell Block: 1      # Diff-Quick Stain: 0        Report signed out from the following location(s) [place] [address], [place] [address] CLIA #: [id number]  [facility] [address] [place], [place] [address] CLIA #: [id number]   Magnesium    Collection Time: 09/23/23  4:44 AM  Result Value Ref Range   Magnesium  2.0 1.7 - 2.4 mg/dL  CBC with Differential/Platelet   Collection Time: 09/23/23  4:44 AM  Result Value Ref Range   WBC 10.9 (H) 4.0 - 10.5 K/uL   RBC 3.72 (L) 3.87 - 5.11 MIL/uL   Hemoglobin 10.6 (L) 12.0 - 15.0 g/dL   HCT 09.3 (L) 23.5 - 57.3 %   MCV 89.5 80.0 - 100.0 fL   MCH 28.5 26.0 - 34.0 pg   MCHC 31.8 30.0 - 36.0 g/dL   RDW 22.0 25.4 - 27.0 %   Platelets 369 150 - 400 K/uL   nRBC 0.0 0.0 - 0.2 %   Neutrophils Relative % 80 %   Neutro Abs 8.6 (H) 1.7 - 7.7 K/uL   Lymphocytes Relative 10 %   Lymphs Abs 1.1 0.7 - 4.0 K/uL   Monocytes Relative 8 %   Monocytes Absolute 0.9 0.1 - 1.0 K/uL   Eosinophils Relative 1 %   Eosinophils Absolute 0.1 0.0 - 0.5 K/uL   Basophils Relative 0 %   Basophils Absolute 0.0 0.0 - 0.1 K/uL   Immature Granulocytes 1 %  Abs Immature Granulocytes 0.06 0.00 - 0.07 K/uL  Phosphorus   Collection Time: 09/23/23  4:44 AM  Result Value Ref Range   Phosphorus 3.2 2.5 - 4.6 mg/dL  Comprehensive metabolic panel with GFR   Collection Time: 09/23/23  4:44 AM  Result Value Ref Range   Sodium 134 (L) 135 -  145 mmol/L   Potassium 3.7 3.5 - 5.1 mmol/L   Chloride 106 98 - 111 mmol/L   CO2 17 (L) 22 - 32 mmol/L   Glucose, Bld 106 (H) 70 - 99 mg/dL   BUN 36 (H) 8 - 23 mg/dL   Creatinine, Ser 1.91 (H) 0.44 - 1.00 mg/dL   Calcium  8.1 (L) 8.9 - 10.3 mg/dL   Total Protein 5.6 (L) 6.5 - 8.1 g/dL   Albumin 2.5 (L) 3.5 - 5.0 g/dL   AST 21 15 - 41 U/L   ALT 13 0 - 44 U/L   Alkaline Phosphatase 116 38 - 126 U/L   Total Bilirubin 0.7 0.0 - 1.2 mg/dL   GFR, Estimated 18 (L) >60 mL/min   Anion gap 11 5 - 15  Fungus Culture With Stain   Collection Time: 09/23/23 10:32 AM   Specimen: PATH Cytology Peritoneal fluid  Result Value Ref Range   Fungus Stain Final report    Fungus (Mycology) Culture PENDING    Fungal Source CYTO PERI   Acid Fast Smear (AFB)   Collection Time: 09/23/23 10:32 AM   Specimen: PATH Cytology Peritoneal fluid  Result Value Ref Range   AFB Specimen Processing Concentration    Acid Fast Smear Negative    Source (AFB) CYTO PERI   Anaerobic culture w Gram Stain   Collection Time: 09/23/23 10:32 AM   Specimen: Fluid  Result Value Ref Range   Specimen Description FLUID PERITONEAL    Special Requests NONE    Gram Stain      FEW WBC PRESENT,BOTH PMN AND MONONUCLEAR NO ORGANISMS SEEN    Culture      NO ANAEROBES ISOLATED Performed at [facility], [address], [place], [place] [address]    Report Status 09/28/2023 FINAL   Fungus Culture Result   Collection Time: 09/23/23 10:32 AM  Result Value Ref Range   Result 1 Comment   Lactate dehydrogenase (pleural or peritoneal fluid)   Collection Time: 09/23/23 10:32 AM  Result Value Ref Range   LD, Fluid 211 (H) 3 - 23 U/L   Fluid Type-FLDH CYTO PERI   Triglycerides, Body Fluid   Collection Time: 09/23/23 10:32 AM  Result Value Ref Range   Triglycerides, Fluid 49 Not Estab. mg/dL   Fluid Type-FTRIG CYTO PERI   Body fluid cell count with differential   Collection Time: 09/23/23 10:32 AM  Result Value Ref Range    Fluid Type-FCT CYTO PERI    Color, Fluid YELLOW YELLOW   Appearance, Fluid HAZY (A) CLEAR   Total Nucleated Cell Count, Fluid 1,819 cu mm   Neutrophil Count, Fluid 66 %   Lymphs, Fluid 13 %   Monocyte-Macrophage-Serous Fluid 21 %   Eos, Fluid 0 %  Amylase, pleural or peritoneal fluid      Collection Time: 09/23/23 10:32 AM  Result Value Ref Range   Amylase, Fluid 30 U/L   Fluid Type-FAMY CYTO PERI   Albumin, pleural or peritoneal fluid    Collection Time: 09/23/23 10:32 AM  Result Value Ref Range   Albumin, Fluid 2.1 g/dL   Fluid Type-FALB CYTO PERI   Protein,  pleural or peritoneal fluid   Collection Time: 09/23/23 10:32 AM  Result Value Ref Range   Total protein, fluid 4.2 g/dL   Fluid Type-FTP CYTO PERI   Glucose, pleural or peritoneal fluid   Collection Time: 09/23/23 10:32 AM  Result Value Ref Range   Glucose, Fluid 97 mg/dL   Fluid Type-FGLU CYTO PERI   Lipase, Fluid   Collection Time: 09/23/23 10:32 AM  Result Value Ref Range   Lipase-Fluid 17 U/L   Source of Sample CYTO PERI   Total bilirubin, body fluid   Collection Time: 09/23/23 10:39 AM  Result Value Ref Range   Total bilirubin, fluid 0.3 mg/dL  Body fluid culture w Gram Stain   Collection Time: 09/23/23 10:47 AM   Specimen: PATH Cytology Peritoneal fluid  Result Value Ref Range   Specimen Description      FLUID Performed at [facility], [address] [place], [place] [address]    Special Requests PERITONEAL    Gram Stain      FEW WBC PRESENT,BOTH PMN AND MONONUCLEAR NO ORGANISMS SEEN    Culture      NO GROWTH 3 DAYS Performed at [facility], [address], [place], [place] [address]    Report Status 09/27/2023 FINAL   Urinalysis, w/ Reflex to Culture (Infection Suspected) -Urine, Clean Catch   Collection Time: 09/23/23  3:00 PM  Result Value Ref Range   Specimen Source URINE, CLEAN CATCH    Color, Urine YELLOW (A) YELLOW   APPearance HAZY (A) CLEAR   Specific Gravity, Urine  1.015 1.005 - 1.030   pH 5.0 5.0 - 8.0   Glucose, UA NEGATIVE NEGATIVE mg/dL   Hgb urine dipstick SMALL (A) NEGATIVE   Bilirubin Urine NEGATIVE NEGATIVE   Ketones, ur NEGATIVE NEGATIVE mg/dL   Protein, ur NEGATIVE NEGATIVE mg/dL   Nitrite NEGATIVE NEGATIVE   Leukocytes,Ua TRACE (A) NEGATIVE   RBC / HPF 0-5 0 - 5 RBC/hpf   WBC, UA 11-20 0 - 5 WBC/hpf   Bacteria, UA MANY (A) NONE SEEN   Squamous Epithelial / HPF 6-10 0 - 5 /HPF   Mucus PRESENT   Urinalysis, Complete w Microscopic -Urine, Clean Catch   Collection Time: 09/23/23  3:00 PM  Result Value Ref Range   Color, Urine YELLOW (A) YELLOW   APPearance HAZY (A) CLEAR   Specific Gravity, Urine 1.015 1.005 - 1.030   pH 5.0 5.0 - 8.0   Glucose, UA NEGATIVE NEGATIVE mg/dL   Hgb urine dipstick SMALL (A) NEGATIVE   Bilirubin Urine NEGATIVE NEGATIVE   Ketones, ur NEGATIVE NEGATIVE mg/dL   Protein, ur NEGATIVE NEGATIVE mg/dL   Nitrite NEGATIVE NEGATIVE   Leukocytes,Ua SMALL (A) NEGATIVE   RBC / HPF 0-5 0 - 5 RBC/hpf   WBC, UA 11-20 0 - 5 WBC/hpf   Bacteria, UA MANY (A) NONE SEEN   Squamous Epithelial / HPF 6-10 0 - 5 /HPF   Mucus PRESENT    Hyaline Casts, UA PRESENT   Protein / creatinine ratio, urine   Collection Time: 09/23/23  3:00 PM  Result Value Ref Range   Creatinine, Urine 145 mg/dL   Total Protein, Urine 16 mg/dL   Protein Creatinine Ratio 0.11 0.00 - 0.15 mg/mg[Cre]  Magnesium    Collection Time: 09/24/23  4:39 AM  Result Value Ref Range   Magnesium  2.0 1.7 - 2.4 mg/dL  CBC with Differential/Platelet   Collection Time: 09/24/23  4:39 AM  Result Value Ref Range  WBC 10.4 4.0 - 10.5 K/uL   RBC 3.65 (L) 3.87 - 5.11 MIL/uL   Hemoglobin 10.3 (L) 12.0 - 15.0 g/dL   HCT 24.4 (L) 01.0 - 27.2 %   MCV 89.6 80.0 - 100.0 fL   MCH 28.2 26.0 - 34.0 pg   MCHC 31.5 30.0 - 36.0 g/dL   RDW 53.6 64.4 - 03.4 %   Platelets 324 150 - 400 K/uL   nRBC 0.0 0.0 - 0.2 %   Neutrophils Relative % 78 %   Neutro Abs 8.2 (H) 1.7 - 7.7  K/uL   Lymphocytes Relative 11 %   Lymphs Abs 1.2 0.7 - 4.0 K/uL   Monocytes Relative 9 %   Monocytes Absolute 0.9 0.1 - 1.0 K/uL   Eosinophils Relative 1 %   Eosinophils Absolute 0.1 0.0 - 0.5 K/uL   Basophils Relative 0 %   Basophils Absolute 0.0 0.0 - 0.1 K/uL   Immature Granulocytes 1 %   Abs Immature Granulocytes 0.06 0.00 - 0.07 K/uL  Phosphorus   Collection Time: 09/24/23  4:39 AM  Result Value Ref Range   Phosphorus 3.1 2.5 - 4.6 mg/dL  Comprehensive metabolic panel with GFR   Collection Time: 09/24/23  4:39 AM  Result Value Ref Range   Sodium 134 (L) 135 - 145 mmol/L   Potassium 3.8 3.5 - 5.1 mmol/L   Chloride 108 98 - 111 mmol/L   CO2 18 (L) 22 - 32 mmol/L   Glucose, Bld 96 70 - 99 mg/dL   BUN 41 (H) 8 - 23 mg/dL   Creatinine, Ser 7.42 (H) 0.44 - 1.00 mg/dL   Calcium  7.7 (L) 8.9 - 10.3 mg/dL   Total Protein 5.0 (L) 6.5 - 8.1 g/dL   Albumin 2.1 (L) 3.5 - 5.0 g/dL   AST 21 15 - 41 U/L   ALT 13 0 - 44 U/L   Alkaline Phosphatase 100 38 - 126 U/L   Total Bilirubin 0.7 0.0 - 1.2 mg/dL   GFR, Estimated 17 (L) >60 mL/min   Anion gap 8 5 - 15  Magnesium    Collection Time: 09/25/23  5:27 AM  Result Value Ref Range   Magnesium  1.8 1.7 - 2.4 mg/dL  CBC with Differential/Platelet   Collection Time: 09/25/23  5:27 AM  Result Value Ref Range   WBC 11.2 (H) 4.0 - 10.5 K/uL   RBC 3.68 (L) 3.87 - 5.11 MIL/uL   Hemoglobin 10.4 (L) 12.0 - 15.0 g/dL   HCT 59.5 (L) 63.8 - 75.6 %   MCV 88.3 80.0 - 100.0 fL   MCH 28.3 26.0 - 34.0 pg   MCHC 32.0 30.0 - 36.0 g/dL   RDW 43.3 29.5 - 18.8 %   Platelets 318 150 - 400 K/uL   nRBC 0.0 0.0 - 0.2 %   Neutrophils Relative % 80 %   Neutro Abs 9.0 (H) 1.7 - 7.7 K/uL   Lymphocytes Relative 10 %   Lymphs Abs 1.1 0.7 - 4.0 K/uL   Monocytes Relative 8 %   Monocytes Absolute 0.9 0.1 - 1.0 K/uL   Eosinophils Relative 1 %   Eosinophils Absolute 0.1 0.0 - 0.5 K/uL   Basophils Relative 0 %   Basophils Absolute 0.0 0.0 - 0.1 K/uL   Immature  Granulocytes 1 %   Abs Immature Granulocytes 0.07 0.00 - 0.07 K/uL  Phosphorus   Collection Time: 09/25/23  5:27 AM  Result Value Ref Range   Phosphorus 2.8 2.5 - 4.6 mg/dL  Comprehensive  metabolic panel with GFR   Collection Time: 09/25/23  5:27 AM  Result Value Ref Range   Sodium 136 135 - 145 mmol/L   Potassium 3.7 3.5 - 5.1 mmol/L   Chloride 110 98 - 111 mmol/L   CO2 18 (L) 22 - 32 mmol/L   Glucose, Bld 100 (H) 70 - 99 mg/dL   BUN 45 (H) 8 - 23 mg/dL   Creatinine, Ser 8.29 (H) 0.44 - 1.00 mg/dL   Calcium  7.6 (L) 8.9 - 10.3 mg/dL   Total Protein 4.9 (L) 6.5 - 8.1 g/dL   Albumin 2.0 (L) 3.5 - 5.0 g/dL   AST 19 15 - 41 U/L   ALT 13 0 - 44 U/L   Alkaline Phosphatase 100 38 - 126 U/L   Total Bilirubin 0.7 0.0 - 1.2 mg/dL   GFR, Estimated 18 (L) >60 mL/min   Anion gap 8 5 - 15      Assessment & Plan:   Problem List Items Addressed This Visit       Cardiovascular and Mediastinum   Hypotension (Chronic)   Continues to trend on lower side, will maintain off all BP medications for now.  Has completed Midodrine .  Labs obtained today.  Recommend compression hose at home on during day and off at night.      Relevant Orders   Ambulatory referral to Home Health     Digestive   Ascites of liver   In presence of bone lesion and liver findings.  To follow with oncology.  Scheduled for PET tomorrow.  CMP today. Overall improvement on exam today and has trended down on weight by 11 lbs since last visit, after paracentesis.  Home health ordered for weakness post hospitalization.      Relevant Orders   Ambulatory referral to Home Health     Endocrine   Thyroid nodule   Noted on chest CT 09/21/23, with recommendation for ultrasound. Will plan to order this in 4 weeks dependent on findings of PET scan.  Discussed with patient and her son.        Musculoskeletal and Integument   Bone lesion (Chronic)   Noted on imaging 09/13/23.  Higher risk due to history of breast cancer. Scheduled  for PET tomorrow and to follow-up with oncology, appreciate their input.  Home health ordered for weakness post hospitalization.      Relevant Orders   Ambulatory referral to Home Health     Genitourinary   AKI (acute kidney injury) (HCC) - Primary (Chronic)   Acute and some improvement on exam today.  Will recheck CMP.  Recommend good hydration at home.  Would avoid diuretic at present for leg edema due to her recent AKI and low BP.  Home health ordered for weakness post hospitalization.      Relevant Orders   CBC with Differential/Platelet   Comprehensive metabolic panel with GFR   Ambulatory referral to Home Health     Other   History of breast cancer in female (Chronic)   In 2013 to left breast, she obtains annual mammograms, continue this.  Needs new prosthesis and bras as current ones are old and tattered, will order these.      Anemia   Stable.  Recheck CBC.  Discussed at length with her.  She does have fatigue and weakness.  ?related to kidneys vs GI or rickets.  Consider GI referral if worsening. Currently scheduled to see oncology upcoming.       Time: 25 minutes, >50%  spent counseling/or care coordination  Follow up plan: Return in about 4 weeks (around 10/26/2023) for CIRRHOSIS.

## 2023-09-28 NOTE — Assessment & Plan Note (Signed)
 Continues to trend on lower side, will maintain off all BP medications for now.  Has completed Midodrine .  Labs obtained today.  Recommend compression hose at home on during day and off at night.

## 2023-09-28 NOTE — Assessment & Plan Note (Addendum)
 Noted on imaging 09/13/23.  Higher risk due to history of breast cancer. Scheduled for PET tomorrow and to follow-up with oncology, appreciate their input.  Home health ordered for weakness post hospitalization.

## 2023-09-28 NOTE — Assessment & Plan Note (Signed)
 Stable.  Recheck CBC.  Discussed at length with her.  She does have fatigue and weakness.  ?related to kidneys vs GI or rickets.  Consider GI referral if worsening. Currently scheduled to see oncology upcoming.

## 2023-09-28 NOTE — Assessment & Plan Note (Signed)
 Noted on chest CT 09/21/23, with recommendation for ultrasound. Will plan to order this in 4 weeks dependent on findings of PET scan.  Discussed with patient and her son.

## 2023-09-28 NOTE — Telephone Encounter (Signed)
-----   Message from Timmy Forbes sent at 09/28/2023  8:26 AM EDT ----- Please arrange her to follow up 1 week after PET scan. Thanks.

## 2023-09-28 NOTE — Assessment & Plan Note (Addendum)
 In presence of bone lesion and liver findings.  To follow with oncology.  Scheduled for PET tomorrow.  CMP today. Overall improvement on exam today and has trended down on weight by 11 lbs since last visit, after paracentesis.  Home health ordered for weakness post hospitalization.

## 2023-09-28 NOTE — Assessment & Plan Note (Addendum)
 Acute and some improvement on exam today.  Will recheck CMP.  Recommend good hydration at home.  Would avoid diuretic at present for leg edema due to her recent AKI and low BP.  Home health ordered for weakness post hospitalization.

## 2023-09-29 ENCOUNTER — Other Ambulatory Visit: Payer: Self-pay | Admitting: Nurse Practitioner

## 2023-09-29 ENCOUNTER — Other Ambulatory Visit: Payer: Self-pay

## 2023-09-29 ENCOUNTER — Ambulatory Visit
Admit: 2023-09-29 | Discharge: 2023-09-29 | Disposition: A | Discharge status: Home / Self Care | Attending: Oncology | Admitting: Oncology

## 2023-09-29 ENCOUNTER — Ambulatory Visit: Payer: Self-pay | Admitting: Nurse Practitioner

## 2023-09-29 DIAGNOSIS — Z9012 Acquired absence of left breast and nipple: Secondary | ICD-10-CM | POA: Insufficient documentation

## 2023-09-29 DIAGNOSIS — Z853 Personal history of malignant neoplasm of breast: Secondary | ICD-10-CM | POA: Diagnosis not present

## 2023-09-29 DIAGNOSIS — R188 Other ascites: Secondary | ICD-10-CM | POA: Diagnosis not present

## 2023-09-29 DIAGNOSIS — M898X9 Other specified disorders of bone, unspecified site: Secondary | ICD-10-CM

## 2023-09-29 LAB — CBC WITH DIFFERENTIAL/PLATELET
Basophils Absolute: 0 10*3/uL (ref 0.0–0.2)
Basos: 0 %
EOS (ABSOLUTE): 0.2 10*3/uL (ref 0.0–0.4)
Eos: 1 %
Hematocrit: 37.7 % (ref 34.0–46.6)
Hemoglobin: 11.5 g/dL (ref 11.1–15.9)
Immature Grans (Abs): 0.1 10*3/uL (ref 0.0–0.1)
Immature Granulocytes: 1 %
Lymphocytes Absolute: 1 10*3/uL (ref 0.7–3.1)
Lymphs: 8 %
MCH: 27.8 pg (ref 26.6–33.0)
MCHC: 30.5 g/dL — ABNORMAL LOW (ref 31.5–35.7)
MCV: 91 fL (ref 79–97)
Monocytes Absolute: 0.9 10*3/uL (ref 0.1–0.9)
Monocytes: 7 %
Neutrophils Absolute: 10.5 10*3/uL — ABNORMAL HIGH (ref 1.4–7.0)
Neutrophils: 83 %
Platelets: 436 10*3/uL (ref 150–450)
RBC: 4.14 x10E6/uL (ref 3.77–5.28)
RDW: 12 % (ref 11.7–15.4)
WBC: 12.6 10*3/uL — ABNORMAL HIGH (ref 3.4–10.8)

## 2023-09-29 LAB — COMPREHENSIVE METABOLIC PANEL WITH GFR
ALT: 20 IU/L (ref 0–32)
AST: 34 IU/L (ref 0–40)
Albumin: 3.1 g/dL — ABNORMAL LOW (ref 3.8–4.8)
Alkaline Phosphatase: 183 IU/L — ABNORMAL HIGH (ref 44–121)
BUN/Creatinine Ratio: 31 — ABNORMAL HIGH (ref 12–28)
BUN: 55 mg/dL — ABNORMAL HIGH (ref 8–27)
Bilirubin Total: 0.3 mg/dL (ref 0.0–1.2)
CO2: 15 mmol/L — ABNORMAL LOW (ref 20–29)
Calcium: 8.5 mg/dL — ABNORMAL LOW (ref 8.7–10.3)
Chloride: 102 mmol/L (ref 96–106)
Creatinine, Ser: 1.75 mg/dL — ABNORMAL HIGH (ref 0.57–1.00)
Globulin, Total: 2.5 g/dL (ref 1.5–4.5)
Glucose: 124 mg/dL — ABNORMAL HIGH (ref 70–99)
Potassium: 4.4 mmol/L (ref 3.5–5.2)
Sodium: 136 mmol/L (ref 134–144)
Total Protein: 5.6 g/dL — ABNORMAL LOW (ref 6.0–8.5)
eGFR: 30 mL/min/{1.73_m2} — ABNORMAL LOW (ref >59)

## 2023-09-29 LAB — MISC LABCORP TEST (SEND OUT): Labcorp test code: 9985

## 2023-09-29 LAB — GLUCOSE, CAPILLARY: Glucose-Capillary: 99 mg/dL (ref 70–99)

## 2023-09-29 MED ORDER — FLUDEOXYGLUCOSE F - 18 (FDG) INJECTION
6.9600 | Freq: Once | INTRAVENOUS | Status: AC | PRN
  Administered 2023-09-29: 6.96 via INTRAVENOUS

## 2023-09-29 NOTE — Progress Notes (Signed)
 Contacted via MyChart   Good afternoon Felicia Robinson, your labs have returned: - CBC is showing improvement in hemoglobin and hematocrit, improving anemia.  Goods news.  However, white blood cell count and neutrophils remain elevated. Many things can cause this and we will see how oncology visit goes upcoming. - Kidney function, creatinine and eGFR, shows ongoing kidney disease but levels a little better this check from previous.  Glucose, sugar, a little high which we can continue to monitor.  If she just ate that may explain findings.  Calcium  remains a little low and alkaline phosphatase a little high, this can at times be bone related.  We will continue to monitor.  Any questions? Keep being amazing!!  Thank you for allowing me to participate in your care.  I appreciate you. Kindest regards, Rector Devonshire

## 2023-09-30 ENCOUNTER — Other Ambulatory Visit: Payer: Self-pay

## 2023-09-30 MED ORDER — MIDODRINE HCL 5 MG PO TABS
5.0000 mg | ORAL_TABLET | Freq: Three times a day (TID) | ORAL | 2 refills | Status: DC
  Filled 2023-09-30: qty 90, 30d supply, fill #0

## 2023-09-30 NOTE — Telephone Encounter (Signed)
 Requested medication (s) are due for refill today: na  Requested medication (s) are on the active medication list: yes  Last refill:  04/27/23-09/30/23 #15 0 refills  Future visit scheduled: yes in 2 months  Notes to clinic:  not delegated per protocol. Last ordered by Brenna Cam, MD. Do you want to refill Rx?     Requested Prescriptions  Pending Prescriptions Disp Refills   midodrine  (PROAMATINE ) 5 MG tablet 15 tablet 0    Sig: Take 1 tablet (5 mg total) by mouth 3 (three) times daily with meals for 5 days.     Not Delegated - Cardiovascular: Midodrine  Failed - 09/30/2023 11:07 AM      Failed - This refill cannot be delegated      Failed - Cr in normal range and within 360 days    Creat  Date Value Ref Range Status  08/17/2019 1.00 (H) 0.60 - 0.93 mg/dL Final    Comment:    For patients >79 years of age, the reference limit for Creatinine is approximately 13% higher for people identified as African-American. .    Creatinine, Ser  Date Value Ref Range Status  09/28/2023 1.75 (H) 0.57 - 1.00 mg/dL Final   Creatinine, Urine  Date Value Ref Range Status  09/23/2023 145 mg/dL Final         Passed - ALT in normal range and within 360 days    ALT  Date Value Ref Range Status  09/28/2023 20 0 - 32 IU/L Final   SGPT (ALT)  Date Value Ref Range Status  11/03/2011 24 U/L Final    Comment:    12-78 NOTE: NEW REFERENCE RANGE 03/14/2011          Passed - AST in normal range and within 360 days    AST  Date Value Ref Range Status  09/28/2023 34 0 - 40 IU/L Final   SGOT(AST)  Date Value Ref Range Status  11/03/2011 23 15 - 37 Unit/L Final         Passed - Last BP in normal range    BP Readings from Last 1 Encounters:  09/28/23 105/71         Passed - Valid encounter within last 12 months    Recent Outpatient Visits           2 days ago AKI (acute kidney injury) (HCC)   [place] [facility] [place], Sanjuan Crumbly T, NP   1 week ago AKI (acute kidney  injury) (HCC)   [place] [facility] Cannady, Sanjuan Crumbly T, NP   4 months ago Stage 3a chronic kidney disease (HCC)   [place] [facility] [place], Lavelle Posey, NP       Future Appointments             In 2 months Cannady, Jolene T, NP [place] [facility]

## 2023-10-05 ENCOUNTER — Inpatient Hospital Stay

## 2023-10-05 ENCOUNTER — Inpatient Hospital Stay: Attending: Oncology | Admitting: Oncology

## 2023-10-05 ENCOUNTER — Telehealth: Payer: Self-pay

## 2023-10-05 ENCOUNTER — Encounter: Payer: Self-pay | Admitting: Oncology

## 2023-10-05 ENCOUNTER — Other Ambulatory Visit: Payer: Self-pay

## 2023-10-05 VITALS — BP 131/90 | HR 107 | Temp 99.6°F | Resp 17 | Wt 116.0 lb

## 2023-10-05 DIAGNOSIS — M9080 Osteopathy in diseases classified elsewhere, unspecified site: Secondary | ICD-10-CM

## 2023-10-05 DIAGNOSIS — R188 Other ascites: Secondary | ICD-10-CM | POA: Diagnosis not present

## 2023-10-05 DIAGNOSIS — C180 Malignant neoplasm of cecum: Secondary | ICD-10-CM | POA: Diagnosis not present

## 2023-10-05 DIAGNOSIS — N1831 Chronic kidney disease, stage 3a: Secondary | ICD-10-CM

## 2023-10-05 DIAGNOSIS — Z807 Family history of other malignant neoplasms of lymphoid, hematopoietic and related tissues: Secondary | ICD-10-CM | POA: Insufficient documentation

## 2023-10-05 DIAGNOSIS — M899 Disorder of bone, unspecified: Secondary | ICD-10-CM | POA: Insufficient documentation

## 2023-10-05 DIAGNOSIS — Z853 Personal history of malignant neoplasm of breast: Secondary | ICD-10-CM

## 2023-10-05 DIAGNOSIS — Z87891 Personal history of nicotine dependence: Secondary | ICD-10-CM | POA: Diagnosis not present

## 2023-10-05 DIAGNOSIS — Z9012 Acquired absence of left breast and nipple: Secondary | ICD-10-CM | POA: Insufficient documentation

## 2023-10-05 DIAGNOSIS — M7989 Other specified soft tissue disorders: Secondary | ICD-10-CM

## 2023-10-05 DIAGNOSIS — R971 Elevated cancer antigen 125 [CA 125]: Secondary | ICD-10-CM | POA: Insufficient documentation

## 2023-10-05 DIAGNOSIS — N189 Chronic kidney disease, unspecified: Secondary | ICD-10-CM | POA: Diagnosis not present

## 2023-10-05 DIAGNOSIS — R229 Localized swelling, mass and lump, unspecified: Secondary | ICD-10-CM | POA: Diagnosis not present

## 2023-10-05 DIAGNOSIS — N183 Chronic kidney disease, stage 3 unspecified: Secondary | ICD-10-CM

## 2023-10-05 DIAGNOSIS — C786 Secondary malignant neoplasm of retroperitoneum and peritoneum: Secondary | ICD-10-CM

## 2023-10-05 DIAGNOSIS — Z800 Family history of malignant neoplasm of digestive organs: Secondary | ICD-10-CM | POA: Diagnosis not present

## 2023-10-05 NOTE — Telephone Encounter (Signed)
 Pt will need CT guided bx of peritonial nodularity. Request sent to IR. Will schedule MD 1 week after bx.

## 2023-10-05 NOTE — Assessment & Plan Note (Signed)
 Encourage oral hydration and avoid nephrotoxins.

## 2023-10-05 NOTE — Assessment & Plan Note (Addendum)
 PET scan showed peritoneal nodules with ascites.  Cytology of the service is negative. Recommend CT-guided biopsy of the peritoneal nodularity. Check CA125, CEA, CA 19-9. Hold Aspirin  81mg  for 5 days prior to biopsy and resume after.

## 2023-10-05 NOTE — Assessment & Plan Note (Signed)
 Patient is status post mastectomy, she recalls being on tamoxifen  for 2 to 3 years.   Check CA 27.29, CA 15.3.

## 2023-10-05 NOTE — Assessment & Plan Note (Signed)
 Status post paracentesis.  Fluid studies showed SAAG <0.1.  Likely exudate.  Cytology negative for malignancy.

## 2023-10-05 NOTE — Progress Notes (Signed)
 Hematology/Oncology Progress note [phone] [phone]           REFERRING PROVIDER: Lemar Pyles, NP   CHIEF COMPLAINTS/REASON FOR VISIT:  Evaluation of bone lesions   ASSESSMENT & PLAN:   Bone lesion Diffusely heterogeneous axial and appendicular skeleton with multiple underlying lytic lesions Differential diagnosis includes metastatic bone disease from solid tumor, ie metastatic breast cancer; multiple myeloma etc. Negative M protein on multiple myeloma panel, normal light chain ratio.-Does not support multiple myeloma diagnosis. PET scan showed no hypermetabolic bone lesions.   X-linked hypophosphatemic rickets Patient follows up with endocrinology at [facility] for management. On crysvita    History of breast cancer in female Patient is status post mastectomy, she recalls being on tamoxifen  for 2 to 3 years.   Check CA 27.29, CA 15.3.  CKD (chronic kidney disease) Encourage oral hydration and avoid nephrotoxins.     Peritoneal carcinomatosis (HCC) PET scan showed peritoneal nodules with ascites.  Cytology of the service is negative. Recommend CT-guided biopsy of the peritoneal nodularity. Check CA125, CEA, CA 19-9. Hold Aspirin  81mg  for 5 days prior to biopsy and resume after.   Ascites Status post paracentesis.  Fluid studies showed SAAG <0.1.  Likely exudate.  Cytology negative for malignancy.  Nodule of soft tissue Chronic but increased size of soft tissue nodule in the lower right hemipelvis deep to the gluteus maximus muscle and lateral to the ischial tuberosity of the right. Hypermetabolic on PET scan and increased size comparing to 2018. Obtain MRI right hip for further evaluation.  Orders Placed This Encounter  Procedures   CA 125    Standing Status:   Future    Number of Occurrences:   1    Expected Date:   10/05/2023    Expiration Date:   01/03/2024   Cancer antigen 27.29    Standing Status:   Future    Number of  Occurrences:   1    Expected Date:   10/05/2023    Expiration Date:   01/03/2024   Cancer antigen 15-3    Standing Status:   Future    Number of Occurrences:   1    Expected Date:   10/05/2023    Expiration Date:   01/03/2024   CEA    Standing Status:   Future    Number of Occurrences:   1    Expected Date:   10/05/2023    Expiration Date:   01/03/2024   Cancer antigen 19-9    Standing Status:   Future    Number of Occurrences:   1    Expected Date:   10/05/2023    Expiration Date:   01/03/2024   Follow-up 1 week after biopsy. All questions were answered. The patient knows to call the clinic with any problems, questions or concerns.  Timmy Forbes, MD, PhD [facility] Hematology Oncology 10/05/2023   HISTORY OF PRESENTING ILLNESS:   Felicia Robinson is a  78 y.o.  female with PMH listed below was seen in consultation at the request of  Lemar Pyles, NP  for evaluation of bone lesions.   She reports that since early May, she has experienced severe abdominal pain, distention, diarrhea, and fainting episodes. She was diagnosed with a UTI, treated with hydration and antibiotics, and showed significant improvement. However, she returned to the ER a few days later with persistent symptoms including nausea, vomiting, lack of appetite, and extreme fatigue. She has experienced low blood pressure and fainting episodes  since then. In the emergency room on 09/13/2023. A CT scan revealed diffuse bone lesions. She has a history of left breast cancer diagnosed in 2013, which was stage I and treated with a mastectomy and tamoxifen  for three years.   She has a congenital bone disease, XLH hypophosphatemic rickets, managed by Dr. Angela Barban at [facility] endocrinology. She is currently on crysvita  . There is a concern whether the rickets could be related to the bone lesions.   She has not been taking her blood pressure medication,  regularly due to low blood pressure. She has been able to eat and drink more in  the last 36-48 hours  She has not had a fever, but has experienced chills.  Patient has chronic pain.    INTERVAL HISTORY Felicia Robinson is a 78 y.o. female who has above history reviewed by me today presents for follow up visit for bone lesions.  09/21/2023 - 09/25/2023, patient was hospitalized due to AKI on CKD secondary to contrast exposure, decreased oral intake.  Ascites of liver.  Status post paracentesis with 3.5 L of fluid removed..  Fluid study is consistent of exudate.  Cytology negative for malignancy.  09/29/2023, PET scan showed Small focus of asymmetric increased uptake in the midline along the floor of the mouth. This is of uncertain etiology and significance. Please correlate with clinical findings. If needed additional contrast CT workup could be considered.   Diffuse ascites identified with areas of peritoneal nodularity particularly in the pelvis with some low level abnormal uptake. Differential would include peritoneal carcinomatosis versus infectious or inflammatory etiology. Please correlate for any known history.   No specific areas of abnormal bony uptake.  Cystic/soft tissue nodule in the low right hemipelvis deep to the gluteus maximus muscle and lateral to the ischial tuberosity on the right has some mild abnormal uptake of maximum SUV of 5.3. Of note there is been lesion this location this is increased in size from the study of 2018. Please correlate for any known history. Additional workup with MRI could be considered as clinically appropriate. With the long-term presence would favor more benign process.   Today patient reports feeling better.  Appetite has improved.   MEDICAL HISTORY:  Past Medical History:  Diagnosis Date   Arthritis    Breast cancer (HCC) 2013   left breast   Cancer (HCC) 2013   L breast   Depression 2021   Disorders of phosphorus metabolism    rickets   Fibroadenosis of breast    Gout 2013   Hypertension 2009    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 ER 90%, PR 1%, HER-2/neu not overexpressing.   Osteomalacia    Renal disorder    CKD early stage   Rickets    congenital   Shingles 2013    SURGICAL HISTORY: Past Surgical History:  Procedure Laterality Date   BREAST SURGERY Left 09/30/2011   Left simple mastectomy with sentinel node biopsy   CATARACT EXTRACTION, BILATERAL Bilateral 04/2015   Dr. Demetrios Finders in [place]   COLONOSCOPY  2012   DENTAL SURGERY  03/25/2023   DILATION AND CURETTAGE OF UTERUS  1982   MASTECTOMY Left 2013   with SN biopsy   TONSILLECTOMY  1953   UPPER GI ENDOSCOPY  2012    SOCIAL HISTORY: Social History   Socioeconomic History   Marital status: Widowed    Spouse name: Karren Paddock  Number of children: 1   Years of education: some college   Highest education level: Associate degree: occupational, Scientist, product/process development, or vocational program  Occupational History   Occupation: Retired  Tobacco Use   Smoking status: Former    Current packs/day: 0.00    Average packs/day: 0.5 packs/day for 30.0 years (15.0 ttl pk-yrs)    Types: Cigarettes    Start date: 04/21/1976    Quit date: 04/21/2006    Years since quitting: 17.4   Smokeless tobacco: Never  Vaping Use   Vaping status: Never Used  Substance and Sexual Activity   Alcohol use: Yes    Alcohol/week: 1.0 standard drink of alcohol    Types: 1 Glasses of wine per week    Comment: occ   Drug use: No   Sexual activity: Not Currently  Other Topics Concern   Not on file  Social History Narrative   Not on file   Social Drivers of Health   Financial Resource Strain: Low Risk  (09/20/2023)   Overall Financial Resource Strain (CARDIA)    Difficulty of Paying Living Expenses: Not hard at all  Food Insecurity: No Food Insecurity (09/21/2023)   Hunger Vital Sign    Worried About Running Out of Food in the Last Year: Never true    Ran  Out of Food in the Last Year: Never true  Transportation Needs: No Transportation Needs (09/21/2023)   PRAPARE - Administrator, Civil Service (Medical): No    Lack of Transportation (Non-Medical): No  Physical Activity: Inactive (09/20/2023)   Exercise Vital Sign    Days of Exercise per Week: 0 days    Minutes of Exercise per Session: 20 min  Stress: No Stress Concern Present (09/20/2023)   [employer] of Occupational Health - Occupational Stress Questionnaire    Feeling of Stress : Only a little  Social Connections: Moderately Isolated (09/21/2023)   Social Connection and Isolation Panel    Frequency of Communication with Friends and Family: More than three times a week    Frequency of Social Gatherings with Friends and Family: Once a week    Attends Religious Services: 1 to 4 times per year    Active Member of [employer] or Organizations: No    Attends Banker Meetings: Never    Marital Status: Widowed  Intimate Partner Violence: Not At Risk (09/21/2023)   Humiliation, Afraid, Rape, and Kick questionnaire    Fear of Current or Ex-Partner: No    Emotionally Abused: No    Physically Abused: No    Sexually Abused: No    FAMILY HISTORY: Family History  Problem Relation Age of Onset   Alzheimer's disease Mother    Stroke Father    Lymphoma Brother 37       Hodgkin's Lymphoma   Cancer Brother        non hodgkins lymphoma    Heart disease Brother    Cancer Maternal Uncle 41       cancer of the esophagus   Cancer Paternal Uncle    Heart disease Maternal Grandfather    Rickets Son    Breast cancer Neg Hx     ALLERGIES:  is allergic to allopurinol , codeine, other, and shellfish allergy.  MEDICATIONS:  Current Outpatient Medications  Medication Sig Dispense Refill   aspirin  81 MG chewable tablet Chew 81 mg by mouth daily.     Cholecalciferol (VITAMIN D3) 50 MCG (2000 UT) TABS Take 2,000 Units by mouth daily.  feeding supplement (ENSURE PLUS HIGH PROTEIN)  LIQD Take 237 mLs by mouth 2 (two) times daily between meals. 14220 mL 0   Ferrous Sulfate  (SLOW FE) 142 (45 Fe) MG TBCR Take 1 tablet by mouth daily. Take with a little orange juice. 90 tablet 4   midodrine  (PROAMATINE ) 5 MG tablet Take 1 tablet (5 mg total) by mouth 3 (three) times daily with meals. 90 tablet 2   rosuvastatin  (CRESTOR ) 5 MG tablet TAKE 1 TABLET(5 MG) BY MOUTH DAILY 90 tablet 4   sertraline  (ZOLOFT ) 50 MG tablet Take 1.5 tablets (75 mg total) by mouth daily. 135 tablet 4   No current facility-administered medications for this visit.    Review of Systems  Constitutional:  Positive for appetite change, fatigue and unexpected weight change. Negative for chills and fever.  HENT:   Negative for hearing loss and voice change.   Eyes:  Negative for eye problems.  Respiratory:  Negative for chest tightness and cough.   Cardiovascular:  Negative for chest pain.  Gastrointestinal:  Positive for nausea. Negative for abdominal distention, abdominal pain and blood in stool.  Endocrine: Negative for hot flashes.  Genitourinary:  Negative for difficulty urinating and frequency.   Musculoskeletal:  Positive for arthralgias.  Skin:  Negative for itching and rash.  Neurological:  Negative for extremity weakness.  Hematological:  Negative for adenopathy.  Psychiatric/Behavioral:  Negative for confusion.    PHYSICAL EXAMINATION: ECOG PERFORMANCE STATUS: 2 - Symptomatic, <50% confined to bed Vitals:   10/05/23 1353 10/05/23 1402  BP: (!) 142/90 (!) 131/90  Pulse: (!) 108 (!) 107  Resp: 17   Temp: 99.6 F (37.6 C)   SpO2: 95%    Filed Weights   10/05/23 1353  Weight: 116 lb (52.6 kg)    Physical Exam Constitutional:      General: She is not in acute distress.    Comments: Elderly female with frail appearance  HENT:     Head: Normocephalic and atraumatic.   Eyes:     General: No scleral icterus.   Cardiovascular:     Rate and Rhythm: Normal rate and regular rhythm.      Heart sounds: Normal heart sounds.  Pulmonary:     Effort: Pulmonary effort is normal. No respiratory distress.     Breath sounds: No wheezing.  Abdominal:     General: Bowel sounds are normal. There is no distension.     Palpations: Abdomen is soft.   Musculoskeletal:        General: No deformity. Normal range of motion.     Cervical back: Normal range of motion and neck supple.   Skin:    General: Skin is warm and dry.     Findings: No erythema or rash.   Neurological:     Mental Status: She is alert and oriented to person, place, and time. Mental status is at baseline.     Cranial Nerves: No cranial nerve deficit.   Psychiatric:        Mood and Affect: Mood normal.     LABORATORY DATA:  I have reviewed the data as listed    Latest Ref Rng & Units 09/28/2023    2:48 PM 09/25/2023    5:27 AM 09/24/2023    4:39 AM  CBC  WBC 3.4 - 10.8 x10E3/uL 12.6  11.2  10.4   Hemoglobin 11.1 - 15.9 g/dL 44.0  34.7  42.5   Hematocrit 34.0 - 46.6 % 37.7  32.5  32.7  Platelets 150 - 450 x10E3/uL 436  318  324       Latest Ref Rng & Units 09/28/2023    2:48 PM 09/25/2023    5:27 AM 09/24/2023    4:39 AM  CMP  Glucose 70 - 99 mg/dL 324  401  96   BUN 8 - 27 mg/dL 55  45  41   Creatinine 0.57 - 1.00 mg/dL 0.27  2.53  6.64   Sodium 134 - 144 mmol/L 136  136  134   Potassium 3.5 - 5.2 mmol/L 4.4  3.7  3.8   Chloride 96 - 106 mmol/L 102  110  108   CO2 20 - 29 mmol/L 15  18  18    Calcium  8.7 - 10.3 mg/dL 8.5  7.6  7.7   Total Protein 6.0 - 8.5 g/dL 5.6  4.9  5.0   Total Bilirubin 0.0 - 1.2 mg/dL 0.3  0.7  0.7   Alkaline Phos 44 - 121 IU/L 183  100  100   AST 0 - 40 IU/L 34  19  21   ALT 0 - 32 IU/L 20  13  13        RADIOGRAPHIC STUDIES: I have personally reviewed the radiological images as listed and agreed with the findings in the report. NM PET Image Initial (PI) Skull Base To Thigh Result Date: 10/01/2023 CLINICAL DATA:  Remote history of breast cancer. Lytic bone lesions on recent  examinations. EXAM: NUCLEAR MEDICINE PET SKULL BASE TO THIGH TECHNIQUE: 6.96 mCi F-18 FDG was injected intravenously. Full-ring PET imaging was performed from the skull base to thigh after the radiotracer. CT data was obtained and used for attenuation correction and anatomic localization. Fasting blood glucose: 99 mg/dl COMPARISON:  CT chest with contrast 09/20/2023. Abdomen pelvis CT without contrast 09/13/2023. Chest x-ray 09/21/2023. Ultrasound of the abdomen and kidneys 09/22/2023. remote PET-CT 2013. FINDINGS: Mediastinal blood pool activity: SUV max 2.3 Liver activity: SUV max 2.1 Neck: There is a focal area of uptake maximum SUV value 4.4 along the soft tissues immediately inferior to the tongue, floor mouth region. Please correlate with direct visualization and needed contrast CT scan. Otherwise no specific abnormal uptake elsewhere in the neck including along lymph node change of the submandibular, posterior triangle or internal jugular region. Incidental CT findings: Visualized paranasal sinuses and mastoid air cells are clear. The parotid glands, submandibular glands and thyroid gland are preserved. Streak artifact related to the patient's dental hardware. Chest: No specific abnormal radiotracer uptake above blood pool in the axillary regions, hilum or mediastinum. No abnormal lung uptake. Incidental CT findings: Previous left mastectomy. Heart is nonenlarged. No pericardial effusion. The thoracic aorta has some mild calcified plaque. Thoracic esophagus is slightly patulous. Small bilateral pleural effusions identified. There is some linear opacity the bases likely scar or atelectasis. Emphysematous changes. Breathing motion. Abdomen pelvis: There is physiologic distribution radiotracer along the parenchymal organs, bowel and renal collecting systems. There is scattered ascites identified. Areas of peritoneal thickening identified in the pelvis particularly posteriorly with areas of uptake. Example right  posterior pelvis on image 108 has maximum SUV of 5.6. This would have a differential. Please correlate for known history and etiology of the ascites. Additional areas of thickening and uptake along the upper quadrants as well but to a lesser extent in the pelvis. Incidental CT findings::Fatty liver infiltration. Dense appearance to the renal parenchyma bilaterally. This could be residual enhancement from previous contrast administration. Please correlate with patient's renal function and  time course of contrast administration if this is the history. Diffuse stranding identified along with the described ascites. Bowel is nondilated with scattered stool. Small fat containing inguinal hernias, right-greater-than-left. SKELETON: No focal hypermetabolic areas seen along the visualized osseous structures. Please correlate for previous description lucent bone lesions. There is a focus of uptake at identified maximum SUV of 5.3 corresponding to a heterogeneous low-attenuation soft tissue lesion in the soft tissues lateral to the right ischial tuberosity and deep to the gluteus maximus muscle in the low right hemipelvis. The lesion on image 123 of the CT scan measures 3.0 x 2.7 cm. Going back to older examinations there is lesion this location and retrospect on limited exam of November 2018 measuring 2.1 cm. This was described on the more recent CT scan as well May 2025. Scattered degenerative changes identified. Again few lucent areas are seen. IMPRESSION: Small focus of asymmetric increased uptake in the midline along the floor of the mouth. This is of uncertain etiology and significance. Please correlate with clinical findings. If needed additional contrast CT workup could be considered. Diffuse ascites identified with areas of peritoneal nodularity particularly in the pelvis with some low level abnormal uptake. Differential would include peritoneal carcinomatosis versus infectious or inflammatory etiology. Please  correlate for any known history. No specific areas of abnormal bony uptake. Cystic/soft tissue nodule in the low right hemipelvis deep to the gluteus maximus muscle and lateral to the ischial tuberosity on the right has some mild abnormal uptake of maximum SUV of 5.3. Of note there is been lesion this location this is increased in size from the study of 2018. Please correlate for any known history. Additional workup with MRI could be considered as clinically appropriate. With the long-term presence would favor more benign process. High dense appearance of the renal parenchyma bilaterally with atrophy. One possibility would be renal dysfunction and recent contrast administration. Please correlate with clinical history. Electronically Signed   By: Adrianna Horde M.D.   On: 10/01/2023 17:27   US  Paracentesis Result Date: 09/23/2023 INDICATION: 78 year old female with a history of CKD IIIa, breast cancer, and currently undergoing workup for multiple myeloma. She presented to the ED due to lower extremity swelling and volume overload. Request for diagnostic and therapeutic paracentesis. EXAM: ULTRASOUND GUIDED right PARACENTESIS MEDICATIONS: 1% lidocaine , 7 mL COMPLICATIONS: None immediate. PROCEDURE: Informed written consent was obtained from the patient after a discussion of the risks, benefits and alternatives to treatment. A timeout was performed prior to the initiation of the procedure. Initial ultrasound scanning demonstrates a large amount of ascites within the right lower abdominal quadrant. The right lower abdomen was prepped and draped in the usual sterile fashion. 1% lidocaine  was used for local anesthesia. Following this, a 19 gauge, 7-cm, Yueh catheter was introduced. An ultrasound image was saved for documentation purposes. The paracentesis was performed. The catheter was removed and a dressing was applied. The patient tolerated the procedure well without immediate post procedural complication. FINDINGS: A  total of approximately 3.5 L of amber fluid was removed. Samples were sent to the laboratory as requested by the clinical team. IMPRESSION: Successful ultrasound-guided paracentesis yielding 3.5 liters of peritoneal fluid. Procedure performed by: Estella Helling, PA-C under the supervision of Dr. Baldemar Lev Electronically Signed   By: Fernando Hoyer M.D.   On: 09/23/2023 15:40   ECHOCARDIOGRAM COMPLETE Result Date: 09/23/2023    ECHOCARDIOGRAM REPORT   [facility]   [facility] Date of Exam: 09/22/2023 Medical Rec #:  [id number]  Height:       54.0 in Accession #:    [id number]          Weight:       129.4 lb Date of Birth:  08/20/1945            BSA:          1.437 m Patient Age:    78 years            BP:           108/94 mmHg Patient Gender: F                   HR:           94 bpm. Exam Location:  [facility] Procedure: 2D Echo, Cardiac Doppler and Color Doppler (Both Spectral and Color            Flow Doppler were utilized during procedure). Indications:     I50.31 Acute Diastolic CHF  History:         Patient has no prior history of Echocardiogram examinations.                  Risk Factors:Hypertension. History of Breast Cancer.  Sonographer:     Brigid Canada RDCS Referring Phys:  [id number] ALEXANDRA DEZII Diagnosing Phys: Sammy Crisp MD IMPRESSIONS  1. Left ventricular ejection fraction, by estimation, is 70 to 75%. The left ventricle has hyperdynamic function. The left ventricle has no regional wall motion abnormalities. Left ventricular diastolic parameters were normal.  2. Right ventricular systolic function is normal. The right ventricular size is mildly enlarged. Tricuspid regurgitation signal is inadequate for assessing PA pressure.  3. The mitral valve is normal in structure. No evidence of mitral valve regurgitation. No evidence of mitral stenosis.  4. The aortic valve is tricuspid. There is mild thickening of the aortic valve. Aortic valve regurgitation is trivial. Aortic  valve sclerosis is present, with no evidence of aortic valve stenosis. FINDINGS  Left Ventricle: Left ventricular ejection fraction, by estimation, is 70 to 75%. The left ventricle has hyperdynamic function. The left ventricle has no regional wall motion abnormalities. The left ventricular internal cavity size was normal in size. There is no left ventricular hypertrophy. Left ventricular diastolic parameters were normal. Right Ventricle: The right ventricular size is mildly enlarged. No increase in right ventricular wall thickness. Right ventricular systolic function is normal. Tricuspid regurgitation signal is inadequate for assessing PA pressure. Left Atrium: Left atrial size was normal in size. Right Atrium: Right atrial size was normal in size. Pericardium: Trivial pericardial effusion is present. Mitral Valve: The mitral valve is normal in structure. No evidence of mitral valve regurgitation. No evidence of mitral valve stenosis. Tricuspid Valve: The tricuspid valve is not well visualized. Tricuspid valve regurgitation is trivial. Aortic Valve: The aortic valve is tricuspid. There is mild thickening of the aortic valve. Aortic valve regurgitation is trivial. Aortic valve sclerosis is present, with no evidence of aortic valve stenosis. Aortic valve mean gradient measures 6.5 mmHg. Aortic valve peak gradient measures 12.5 mmHg. Pulmonic Valve: The pulmonic valve was not well visualized. Pulmonic valve regurgitation is trivial. No evidence of pulmonic stenosis. Aorta: The aortic root is normal in size and structure. Pulmonary Artery: The pulmonary artery is of normal size. Venous: The inferior vena cava was not well visualized. IAS/Shunts: The interatrial septum was not well visualized.  LEFT VENTRICLE PLAX 2D LVIDd:         4.60 cm Diastology LVIDs:  2.40 cm LV e' medial:    7.89 cm/s LV PW:         0.80 cm LV E/e' medial:  6.2 LV IVS:        0.80 cm LV e' lateral:   12.20 cm/s                        LV  E/e' lateral: 4.0  RIGHT VENTRICLE RV Basal diam:  3.70 cm RV S prime:     34.22 cm/s TAPSE (M-mode): 3.0 cm LEFT ATRIUM             Index        RIGHT ATRIUM           Index LA diam:        4.40 cm 3.06 cm/m   RA Area:     12.10 cm LA Vol (A2C):   24.2 ml 16.83 ml/m  RA Volume:   24.20 ml  16.83 ml/m LA Vol (A4C):   25.7 ml 17.88 ml/m LA Biplane Vol: 25.7 ml 17.88 ml/m  AORTIC VALVE AV Vmax:           176.46 cm/s AV Vmean:          120.312 cm/s AV VTI:            0.297 m AV Peak Grad:      12.5 mmHg AV Mean Grad:      6.5 mmHg LVOT Vmax:         163.67 cm/s LVOT Vmean:        109.667 cm/s LVOT VTI:          0.259 m LVOT/AV VTI ratio: 0.87  AORTA Ao Root diam: 3.30 cm MITRAL VALVE MV Area (PHT): 4.40 cm    SHUNTS MV Decel Time: 173 msec    Systemic VTI: 0.26 m MV E velocity: 48.80 cm/s MV A velocity: 93.40 cm/s MV E/A ratio:  0.52 Veryl Gottron End MD Electronically signed by Sammy Crisp MD Signature Date/Time: 09/23/2023/7:14:12 AM    Final    US  ASCITES (ABDOMEN LIMITED) Result Date: 09/22/2023 CLINICAL DATA:  Ascites. EXAM: LIMITED ABDOMEN ULTRASOUND FOR ASCITES TECHNIQUE: Limited ultrasound survey for ascites was performed in all four abdominal quadrants. COMPARISON:  CT 09/13/2023 FINDINGS: Moderate to large volume of ascites, greatest in the pelvis and left upper quadrant. IMPRESSION: Moderate to large volume abdominopelvic ascites. Electronically Signed   By: Chadwick Colonel M.D.   On: 09/22/2023 19:10   US  RENAL Result Date: 09/22/2023 CLINICAL DATA:  Acute kidney injury EXAM: RENAL / URINARY TRACT ULTRASOUND COMPLETE COMPARISON:  None Available. FINDINGS: Right Kidney: Renal measurements: 9.3 x 4.1 x 4.6 cm = volume: 90 mL. Echogenicity is increased. No mass or hydronephrosis visualized. Left Kidney: Renal measurements: 8.1 x 4.1 x 2.7 cm = volume: 43 mL. Echogenicity is increased. No mass or hydronephrosis visualized. Bladder: Nondistended. Other: None. IMPRESSION: 1. No hydronephrosis. 2.  Increased echogenicity of the kidneys bilaterally consistent with medical renal disease. Electronically Signed   By: Tyron Gallon M.D.   On: 09/22/2023 19:10   US  Venous Img Lower Bilateral Result Date: 09/21/2023 CLINICAL DATA:  Bilateral lower extremity swelling EXAM: BILATERAL LOWER EXTREMITY VENOUS DOPPLER ULTRASOUND TECHNIQUE: Gray-scale sonography with graded compression, as well as color Doppler and duplex ultrasound were performed to evaluate the lower extremity deep venous systems from the level of the common femoral vein and including the common femoral, femoral, profunda femoral, popliteal and calf veins  including the posterior tibial, peroneal and gastrocnemius veins when visible. The superficial great saphenous vein was also interrogated. Spectral Doppler was utilized to evaluate flow at rest and with distal augmentation maneuvers in the common femoral, femoral and popliteal veins. COMPARISON:  None Available. FINDINGS: RIGHT LOWER EXTREMITY Common Femoral Vein: No evidence of thrombus. Normal compressibility, respiratory phasicity and response to augmentation. Saphenofemoral Junction: No evidence of thrombus. Normal compressibility and flow on color Doppler imaging. Profunda Femoral Vein: No evidence of thrombus. Normal compressibility and flow on color Doppler imaging. Femoral Vein: No evidence of thrombus. Normal compressibility, respiratory phasicity and response to augmentation. Popliteal Vein: No evidence of thrombus. Normal compressibility, respiratory phasicity and response to augmentation. Calf Veins: No evidence of thrombus. Normal compressibility and flow on color Doppler imaging. Superficial Great Saphenous Vein: No evidence of thrombus. Normal compressibility. Venous Reflux:  None. Other Findings:  None. LEFT LOWER EXTREMITY Common Femoral Vein: No evidence of thrombus. Normal compressibility, respiratory phasicity and response to augmentation. Saphenofemoral Junction: No evidence of  thrombus. Normal compressibility and flow on color Doppler imaging. Profunda Femoral Vein: No evidence of thrombus. Normal compressibility and flow on color Doppler imaging. Femoral Vein: No evidence of thrombus. Normal compressibility, respiratory phasicity and response to augmentation. Popliteal Vein: No evidence of thrombus. Normal compressibility, respiratory phasicity and response to augmentation. Calf Veins: No evidence of thrombus. Normal compressibility and flow on color Doppler imaging. Superficial Great Saphenous Vein: No evidence of thrombus. Normal compressibility. Venous Reflux:  None. Other Findings:  None. IMPRESSION: No evidence of deep venous thrombosis in either lower extremity. Electronically Signed   By: Fernando Hoyer M.D.   On: 09/21/2023 13:24   DG Chest 1 View Result Date: 09/21/2023 CLINICAL DATA:  Shortness of breath and chest pain. EXAM: CHEST  1 VIEW COMPARISON:  Chest CT yesterday FINDINGS: Stable cardiomegaly. Aortic tortuosity. Left pleural effusion is unchanged allowing for differences in modality. The right pleural effusion on CT is not well seen by radiograph. No new airspace disease. No pulmonary edema. No pneumothorax. Scoliotic curvature of spine. IMPRESSION: 1. Unchanged left pleural effusion. The right pleural effusion on CT is not well seen by radiograph. 2. Stable cardiomegaly and aortic tortuosity. Electronically Signed   By: Chadwick Colonel M.D.   On: 09/21/2023 13:22   CT CHEST W CONTRAST Result Date: 09/20/2023 CLINICAL DATA:  History of left-sided breast cancer status post mastectomy. Lytic lesions on abdomen/pelvis CT 09/13/2023. EXAM: CT CHEST WITH CONTRAST TECHNIQUE: Multidetector CT imaging of the chest was performed during intravenous contrast administration. RADIATION DOSE REDUCTION: This exam was performed according to the departmental dose-optimization program which includes automated exposure control, adjustment of the mA and/or kV according to patient  size and/or use of iterative reconstruction technique. CONTRAST:  50mL OMNIPAQUE  IOHEXOL  300 MG/ML  SOLN COMPARISON:  None Available. FINDINGS: Cardiovascular: Heart size upper normal. No substantial pericardial effusion. Mild atherosclerotic calcification is noted in the wall of the thoracic aorta. Enlargement of the pulmonary outflow tract/main pulmonary arteries suggests pulmonary arterial hypertension. Mediastinum/Nodes: 1.7 cm right thyroid nodule evident. No mediastinal lymphadenopathy. No gross colonic mass. There is no hilar lymphadenopathy. Small to moderate hiatal hernia. The esophagus has normal imaging features. There is no axillary lymphadenopathy. Lungs/Pleura: Centrilobular emphsyema noted. No suspicious pulmonary nodule or mass. 3 mm left upper lobe nodule identified on 34/4. Minimal subsegmental atelectasis noted in both lower lungs. Small left and tiny right pleural effusions. Upper Abdomen: Moderate ascites noted in the visualized upper abdomen. Heterogeneous enhancement of liver  parenchyma with 6 mm low-density anterior subcapsular liver lesion noted, similar to recent abdomen CT. Musculoskeletal: Heterogeneous mineralization diffusely in the visualized bony anatomy. No overtly concerning lytic or sclerotic osseous abnormality within the visualized bony anatomy of the thorax. Degenerative changes are noted in both shoulders, right greater than left. IMPRESSION: 1. No evidence for primary malignancy in the chest. 2. Small left and tiny right pleural effusions with minimal subsegmental atelectasis in both lower lungs. 3. Enlargement of the pulmonary outflow tract/main pulmonary arteries suggests pulmonary arterial hypertension. 4. Bones are heterogeneously mineralized without a dominant suspicious lytic or sclerotic osseous lesion evident. 5. Moderate ascites in the visualized upper abdomen. 6. Heterogeneous enhancement of liver parenchyma with 6 mm low-density anterior subcapsular liver lesion,  similar to recent abdomen CT. 7. 1.7 cm right thyroid nodule. Recommend thyroid US  (ref: J Am Coll Radiol. 2015 Feb;12(2): 143-50). 8. Aortic Atherosclerosis (ICD10-I70.0) and Emphysema (ICD10-J43.9). Electronically Signed   By: Donnal Fusi M.D.   On: 09/20/2023 08:53   DG Chest Port 1 View Result Date: 09/18/2023 CLINICAL DATA:  Weakness and hypotension. EXAM: PORTABLE CHEST 1 VIEW COMPARISON:  11/03/2011 FINDINGS: Shallow inspiration. Linear atelectasis in the lung bases. Mild cardiac enlargement. No vascular congestion, edema, or consolidation. No pleural effusion or pneumothorax. Mediastinal contours appear intact. Degenerative changes in the spine and shoulders. IMPRESSION: Shallow inspiration with atelectasis in the lung bases. Electronically Signed   By: Boyce Byes M.D.   On: 09/18/2023 18:05   CT ABDOMEN PELVIS WO CONTRAST Result Date: 09/13/2023 CLINICAL DATA:  Abdominal pain, acute, nonlocalized EXAM: CT ABDOMEN AND PELVIS WITHOUT CONTRAST TECHNIQUE: Multidetector CT imaging of the abdomen and pelvis was performed following the standard protocol without IV contrast. RADIATION DOSE REDUCTION: This exam was performed according to the departmental dose-optimization program which includes automated exposure control, adjustment of the mA and/or kV according to patient size and/or use of iterative reconstruction technique. COMPARISON:  PET CT 11/05/2011 FINDINGS: Lower chest: No acute abnormality.  Small hiatal hernia. Hepatobiliary: Stable chronic 6 mm hepatic lobe lesion (2:21). No gallstones, gallbladder wall thickening, or pericholecystic fluid. No biliary dilatation. Pancreas: No focal lesion. Normal pancreatic contour. No surrounding inflammatory changes. No main pancreatic ductal dilatation. Spleen: Normal in size without focal abnormality. Adrenals/Urinary Tract: No adrenal nodule bilaterally. No nephrolithiasis and no hydronephrosis. Indeterminate 8 mm hyperdense lesion of the left kidney  (2:19)-given hyperdensity finding likely represents a proteinaceous or hemorrhagic cyst-no further follow-up indicated. Subcentimeter hypodensities are too small to characterize-no further follow-up indicated. No ureterolithiasis or hydroureter. The urinary bladder is unremarkable. Stomach/Bowel: Stomach is within normal limits. No evidence of bowel wall thickening or dilatation. The appendix is not definitely identified with no inflammatory changes in the right lower quadrant to suggest acute appendicitis. Vascular/Lymphatic: No abdominal aorta or iliac aneurysm. Mild atherosclerotic plaque of the aorta and its branches. No abdominal, pelvic, or inguinal lymphadenopathy. Reproductive: Uterus and bilateral adnexa are unremarkable. Other: Vsimple free fluid. No intraperitoneal free gas. No organized fluid collection. Musculoskeletal: Interval development of a 3 x 2.5 cm well-circumscribed fluid density lesion just deep of the right gluteal musculature and lateral to the ischial tuberosity (2:77). Small volume ascites containing umbilical hernia. Small fat containing right inguinal hernia. Status post left mastectomy. Diffusely heterogeneous axial and appendicular skeleton with multiple underlying lytic lesions. No acute displaced fracture. Multilevel degenerative changes of the spine. 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 Bilateral hip degenerative changes. IMPRESSION:  1. Small hiatal hernia. 2. Small to moderate volume simple ascites. 3. Diffusely heterogeneous axial and appendicular skeleton with multiple underlying lytic lesions. Underlying metastatic lesions versus multiple myeloma not excluded. 4. Interval development of an indeterminate 3 cm well-circumscribed fluid density lesion just deep to the right gluteal musculature and lateral to the distal tuberosity. 5. Otherwise limited evaluation on this noncontrast study. 6.  Aortic  Atherosclerosis (ICD10-I70.0). Electronically Signed   By: Morgane  Naveau M.D.   On: 09/13/2023 17:25

## 2023-10-05 NOTE — Assessment & Plan Note (Signed)
 Patient follows up with endocrinology at [facility] for management. On crysvita 

## 2023-10-05 NOTE — Assessment & Plan Note (Signed)
 Diffusely heterogeneous axial and appendicular skeleton with multiple underlying lytic lesions Differential diagnosis includes metastatic bone disease from solid tumor, ie metastatic breast cancer; multiple myeloma etc. Negative M protein on multiple myeloma panel, normal light chain ratio.-Does not support multiple myeloma diagnosis. PET scan showed no hypermetabolic bone lesions.

## 2023-10-05 NOTE — Assessment & Plan Note (Signed)
 Chronic but increased size of soft tissue nodule in the lower right hemipelvis deep to the gluteus maximus muscle and lateral to the ischial tuberosity of the right. Hypermetabolic on PET scan and increased size comparing to 2018. Obtain MRI right hip for further evaluation.

## 2023-10-06 ENCOUNTER — Telehealth: Payer: Self-pay | Admitting: *Deleted

## 2023-10-06 LAB — CANCER ANTIGEN 27.29: CA 27.29: 183 U/mL — ABNORMAL HIGH (ref 0.0–38.6)

## 2023-10-06 LAB — CANCER ANTIGEN 15-3: CA 15-3: 144 U/mL — ABNORMAL HIGH (ref 0.0–25.0)

## 2023-10-06 LAB — CEA: CEA: 1.8 ng/mL (ref 0.0–4.7)

## 2023-10-06 LAB — CANCER ANTIGEN 19-9: CA 19-9: 29 U/mL (ref 0–35)

## 2023-10-06 LAB — CA 125: Cancer Antigen (CA) 125: 407 U/mL — ABNORMAL HIGH (ref 0.0–38.1)

## 2023-10-06 NOTE — Telephone Encounter (Signed)
 The son has called to see when the appointment is going to be for the biopsy and at this time we do not have it.  The person that sets up all of the appointments for biopsies she is either on vacation or sick but staff here seems like they are going to be starting stuff coming next week .  He lives 3 hours away so he wants to get it done and make sure that he is with her.  I am sending the message to the team's and they will get back to you when they have the date and time

## 2023-10-07 NOTE — Telephone Encounter (Signed)
 I have reached out to IR, they are waiting from radiology review. Will contact pt once we have a date.

## 2023-10-09 ENCOUNTER — Other Ambulatory Visit: Payer: Self-pay

## 2023-10-09 ENCOUNTER — Telehealth: Payer: Self-pay | Admitting: Nurse Practitioner

## 2023-10-09 ENCOUNTER — Telehealth: Payer: Self-pay

## 2023-10-09 DIAGNOSIS — Z853 Personal history of malignant neoplasm of breast: Secondary | ICD-10-CM

## 2023-10-09 DIAGNOSIS — C786 Secondary malignant neoplasm of retroperitoneum and peritoneum: Secondary | ICD-10-CM

## 2023-10-09 NOTE — Telephone Encounter (Signed)
 Copied from CRM [id number]. Topic: General - Other >> Oct 09, 2023  9:59 AM Everlene Hobby D wrote: Patient is requesting to delay home health physical therapy  10/16/23- call back Shelagh Derrick [phone]

## 2023-10-09 NOTE — Telephone Encounter (Signed)
 Called and LVM letting Felicia Robinson know Felicia Robinson's response.

## 2023-10-09 NOTE — Telephone Encounter (Signed)
 Per Dr. Wilhelmenia Harada cancel CT biopsy, I talk with IR, they can not see the nodularity. please obtain FES PET. keep MRI appt. son is made aware about that  Request for bx cancellation has been sent.  Please schedule FES PET and notify pt's son of appt.

## 2023-10-09 NOTE — Progress Notes (Signed)
 Art Largo, MD sent to Jerona Mooring S PROCEDURE / BIOPSY REVIEW Date: 10/08/23  Requested Biopsy site: Peritoneum Reason for request: Q carcinomatosis Imaging review: I reviewed all pertinent diagnostic studies, including; PET CT  Decision: Declined / Defer  Additional comments: Pt w ascites, contraindicating potential Bx. No overt nodularity.  Please contact me with questions, concerns, or if issue pertaining to this request arise.  Art Largo, MD [facility]

## 2023-10-09 NOTE — Addendum Note (Signed)
 Addended by: Craven Do on: 10/09/2023 09:01 AM   Modules accepted: Orders

## 2023-10-09 NOTE — Telephone Encounter (Signed)
 Error

## 2023-10-13 ENCOUNTER — Telehealth: Payer: Self-pay | Admitting: Nurse Practitioner

## 2023-10-13 NOTE — Telephone Encounter (Signed)
 Copied from CRM [id number]. Topic: General - Other >> Oct 13, 2023 10:10 AM Wess RAMAN wrote: Reason for CRM: Lucie from [facility] would like the documents refaxed for patient's signed CMN and office visit notes due to them only receiving pages 13-18  Callback #: [phone] Fax #: [phone]

## 2023-10-13 NOTE — Telephone Encounter (Signed)
Forms have been re-faxed as requested

## 2023-10-13 NOTE — Telephone Encounter (Signed)
 Pt scheduled for PET on 7/11

## 2023-10-14 ENCOUNTER — Ambulatory Visit: Payer: Self-pay | Admitting: Oncology

## 2023-10-19 ENCOUNTER — Ambulatory Visit
Admission: RE | Admit: 2023-10-19 | Discharge: 2023-10-19 | Disposition: A | Discharge status: Home / Self Care | Source: Ambulatory Visit | Attending: Oncology | Admitting: Oncology

## 2023-10-19 DIAGNOSIS — M7989 Other specified soft tissue disorders: Secondary | ICD-10-CM | POA: Insufficient documentation

## 2023-10-19 MED ORDER — GADOBUTROL 1 MMOL/ML IV SOLN
5.0000 mL | Freq: Once | INTRAVENOUS | Status: AC | PRN
  Administered 2023-10-19: 5 mL via INTRAVENOUS

## 2023-10-21 ENCOUNTER — Other Ambulatory Visit: Payer: Self-pay

## 2023-10-21 DIAGNOSIS — R971 Elevated cancer antigen 125 [CA 125]: Secondary | ICD-10-CM

## 2023-10-21 NOTE — Telephone Encounter (Signed)
 Felicia Robinson entered referral for gynonc. Will you schedule and notify son of appointment date and time.

## 2023-10-21 NOTE — Telephone Encounter (Signed)
-----   Message from Zelphia Cap sent at 10/20/2023 10:14 PM EDT ----- CA125 is severely elevated.  Please refer her to [facility] ----- Message ----- From: Rebecka, Lab [facility] [place] Sent: 10/06/2023   6:36 AM EDT To: Zelphia Cap, MD

## 2023-10-23 LAB — FUNGUS CULTURE RESULT

## 2023-10-23 LAB — FUNGUS CULTURE WITH STAIN

## 2023-10-23 LAB — FUNGAL ORGANISM REFLEX

## 2023-10-26 ENCOUNTER — Ambulatory Visit: Payer: Self-pay

## 2023-10-26 NOTE — Telephone Encounter (Signed)
 Called and notified patient's son of Jolene's message. Patient's son states that he is planning to call EMS for his mom because she has been refusing UC and the ER for 3 days now.

## 2023-10-26 NOTE — Telephone Encounter (Signed)
 Action required from provider: update on condition Last Seen: 09/28/23 Symptoms are worsening Interventions: son adjusting BP meds as needed to accommodate for BP readings, small meals as tolerated    Copied from CRM [id number]. Topic: Clinical - Medication Question >> Oct 26, 2023 10:24 AM Drema MATSU wrote: Reason for CRM: Patient has developed a bad chest cold (constantly coughing) while out of town and patient is requesting medicine that will help her. Pharmacy she wants to use is  [facility] in [place], [facility] [phone] Reason for Disposition  Cough  Answer Assessment - Initial Assessment Questions Spoke to patient's son who also needs direction for assistance via rehab facility or LTAC to help care for mother Reports that she is extremely weak and exhausted.  Currently being worked up for cancer diagnosis. Has appointments this week with gynecological oncology and for PET scans  Reports that she is still eating and drinking, but very little.   Reports that she often has diarrhea after eating Currently drinks 18-20 oz day Reports that BP is up and down  Reports that pulse is elevated at times.  Patient is out of town right now and with current road closures, waiting on best timing to get back Will consider ED, but is concerned that she will be admitted and he wants to get her home first. Will use delsym for cough   1. ONSET: When did the cough begin?      Three days 2. SEVERITY: How bad is the cough today?      Mild-moderate- keeping her up at night, biggest problem 3. SPUTUM: Describe the color of your sputum (none, dry cough; clear, white, yellow, green)     Son states that she is coughing stuff up at times 4. HEMOPTYSIS: Are you coughing up any blood? If so ask: How much? (flecks, streaks, tablespoons, etc.)     denies 5. DIFFICULTY BREATHING: Are you having difficulty breathing? If Yes, ask: How bad is it? (e.g., mild, moderate, severe)    - MILD: No  SOB at rest, mild SOB with walking, speaks normally in sentences, can lie down, no retractions, pulse < 100.    - MODERATE: SOB at rest, SOB with minimal exertion and prefers to sit, cannot lie down flat, speaks in phrases, mild retractions, audible wheezing, pulse 100-120.    - SEVERE: Very SOB at rest, speaks in single words, struggling to breathe, sitting hunched forward, retractions, pulse > 120      denies 6. FEVER: Do you have a fever? If Yes, ask: What is your temperature, how was it measured, and when did it start?     denies   10. OTHER SYMPTOMS: Do you have any other symptoms? (e.g., runny nose, wheezing, chest pain)       denies  Protocols used: Cough - Acute Productive-A-AH

## 2023-10-27 ENCOUNTER — Telehealth: Payer: Self-pay

## 2023-10-27 NOTE — Transitions of Care (Post Inpatient/ED Visit) (Signed)
   10/27/2023  Name: Tayllor Breitenstein MRN: [id number] DOB: April 21, 1946  Today's TOC FU Call Status: Today's TOC FU Call Status:: Successful TOC FU Call Completed TOC FU Call Complete Date: 10/27/23 Patient's Name and Date of Birth confirmed.  Transition Care Management Follow-up Telephone Call - patient remains hospitalized   Patient remains hospitalized at [facility] and spoke with son, Laretha Luepke on [place], who requested how to get patient care givers when discharging from the hospital. Advised him to speak with the hospital's discharge planners or case management team to assist with patient's post hospital needs.     Richerd Fish, RN, BSN, CCM [facility], [facility] RN Care Manager Direct Dial: [phone]

## 2023-10-27 NOTE — Telephone Encounter (Signed)
 Noted.  Will monitor updates.

## 2023-10-27 NOTE — Telephone Encounter (Signed)
 Copied from CRM [id number]. Topic: General - Other >> Oct 27, 2023  9:01 AM Powell HERO wrote: Reason for CRM: Patients daugher called from her mothers phone, wanted to give an update to Felicia Robinson. Her mother was admitted to [facility] last night. She states that the patient does have pneumonia, tested positive for COVID, and she thinks they had even mentioned something about a mild heart attack. Will call with more information when it's provided.

## 2023-10-28 ENCOUNTER — Encounter: Payer: Self-pay | Admitting: Nurse Practitioner

## 2023-10-28 ENCOUNTER — Inpatient Hospital Stay

## 2023-10-28 ENCOUNTER — Other Ambulatory Visit

## 2023-10-28 ENCOUNTER — Ambulatory Visit

## 2023-10-30 ENCOUNTER — Ambulatory Visit: Admitting: Nurse Practitioner

## 2023-10-30 ENCOUNTER — Encounter (HOSPITAL_COMMUNITY)

## 2023-11-03 LAB — ACID FAST CULTURE WITH REFLEXED SENSITIVITIES (MYCOBACTERIA): Acid Fast Culture: NEGATIVE

## 2023-11-09 ENCOUNTER — Telehealth: Payer: Self-pay

## 2023-11-09 NOTE — Telephone Encounter (Signed)
 Per PET scan department Felicia Robinson) patient's son stated patient has been in the hospital since June at outside facility [facility]) and cancelled her PET and did not wish to reschedule.  Does this order just need to be cancelled for now

## 2023-11-10 NOTE — Telephone Encounter (Signed)
 Called patient and left a message for her to call back to get scheduled.

## 2023-11-10 NOTE — Telephone Encounter (Signed)
 Can we please get this patient scheduled ASAP with whoever has an opening for a hospital follow up (in the next 2 weeks)

## 2023-11-10 NOTE — Telephone Encounter (Signed)
 Patient's son called back and stated that he will be moving her out of state, so she no longer needs and appt.

## 2023-11-10 NOTE — Telephone Encounter (Unsigned)
 Copied from CRM [id number]. Topic: Clinical - Home Health Verbal Orders >> Nov 10, 2023 10:56 AM Tiffini S wrote: Caller/Agency: Ruther Salt with [facility] Callback Number: [phone] Service Requested: Occupational Therapy and  Physical Therapy have not been out to see the patient yet, will be out to evaluate the patient   Skilled Nursing 1 week, 4 week with two PRN visit  Frequency:  Any new concerns about the patient? Yes, blood pressure was low at 96/64, heart rate is 116,. Needs medication management

## 2023-11-10 NOTE — Telephone Encounter (Signed)
 Noted. Will leave for Jolene FYI.

## 2023-11-11 ENCOUNTER — Other Ambulatory Visit

## 2023-11-11 ENCOUNTER — Ambulatory Visit

## 2023-11-11 ENCOUNTER — Telehealth: Payer: Self-pay

## 2023-11-11 ENCOUNTER — Telehealth: Payer: Self-pay | Admitting: Nurse Practitioner

## 2023-11-11 NOTE — Telephone Encounter (Signed)
 Called and gave verbal orders per Clydie Braun.

## 2023-11-11 NOTE — Telephone Encounter (Signed)
 Copied from CRM [id number]. Topic: Clinical - Home Health Verbal Orders >> Nov 10, 2023 10:56 AM Tiffini S wrote: Caller/Agency: Ruther Salt with [facility] Callback Number: [phone] Service Requested: Occupational Therapy and  Physical Therapy have not been out to see the patient yet, will be out to evaluate the patient   Skilled Nursing 1 week, 4 week with two PRN visit  Frequency:  Any new concerns about the patient? Yes, blood pressure was low at 96/64, heart rate is 116,. Needs medication management      This message was received on 11/10/23.  Son is stating that the [facility] company has not heard about these orders.  Can we call and give verbal orders for [facility]?

## 2023-11-11 NOTE — Telephone Encounter (Signed)
 Copied from CRM [id number]. Topic: General - Other >> Nov 11, 2023  3:52 PM Everette C wrote: Reason for CRM: Merlie with [facility] has called to notify the practice that the patient will be under the care of their organization while staying with her son   Marland can be reached at [phone] if needed further

## 2023-11-12 NOTE — Telephone Encounter (Signed)
 Noted

## 2023-11-13 ENCOUNTER — Telehealth: Payer: Self-pay

## 2023-11-13 NOTE — Telephone Encounter (Signed)
Called and gave verbal orders per Dr. Wynetta Emery.

## 2023-11-13 NOTE — Telephone Encounter (Signed)
 OK for verbal orders?

## 2023-11-13 NOTE — Telephone Encounter (Signed)
 Copied from CRM #[id number]. Topic: Clinical - Home Health Verbal Orders >> Nov 13, 2023  1:58 PM Delon DASEN wrote: Caller/Agency: Earnie with [facility] Callback Number: [phone] Service Requested: Physical Therapy Frequency: 2x week for 6 weeks Any new concerns about the patient? Yes- Patient was recently in hospital for respiratory conditions

## 2023-11-13 NOTE — Telephone Encounter (Signed)
 Copied from CRM [id number]. Topic: Clinical - Home Health Verbal Orders >> Nov 13, 2023  2:05 PM [name] wrote: Caller/Agency: Ellouise Mins [facility] Callback Number: [phone] Service Requested: Occupational Therapy Frequency: 1*1 4*1 Any new concerns about the patient? No

## 2023-11-25 ENCOUNTER — Telehealth: Payer: Self-pay

## 2023-11-25 ENCOUNTER — Encounter: Payer: Self-pay | Admitting: Nurse Practitioner

## 2023-11-25 NOTE — Telephone Encounter (Signed)
 Called and LVM giving verbal order per Jolene.

## 2023-11-25 NOTE — Telephone Encounter (Signed)
 Copied from CRM #[id number]. Topic: Clinical - Home Health Verbal Orders >> Nov 25, 2023  3:29 PM Sophia H wrote: Caller/Agency: Jerel - [facility]  Callback Number: [phone] Service Requested: Wanting to get verbal orders to move forward with patients new provider. Don't want to have to wait on new provider since patient is discharging from home health Monday 08/11.  Any new concerns about the patient? No

## 2023-12-01 DIAGNOSIS — E041 Nontoxic single thyroid nodule: Secondary | ICD-10-CM | POA: Diagnosis not present

## 2023-12-01 DIAGNOSIS — E550 Rickets, active: Secondary | ICD-10-CM | POA: Diagnosis not present

## 2023-12-01 DIAGNOSIS — R188 Other ascites: Secondary | ICD-10-CM | POA: Diagnosis not present

## 2023-12-01 DIAGNOSIS — R32 Unspecified urinary incontinence: Secondary | ICD-10-CM | POA: Diagnosis not present

## 2023-12-01 DIAGNOSIS — R2681 Unsteadiness on feet: Secondary | ICD-10-CM | POA: Diagnosis not present

## 2023-12-01 DIAGNOSIS — I10 Essential (primary) hypertension: Secondary | ICD-10-CM | POA: Diagnosis not present

## 2023-12-01 DIAGNOSIS — R600 Localized edema: Secondary | ICD-10-CM | POA: Diagnosis not present

## 2023-12-01 DIAGNOSIS — C786 Secondary malignant neoplasm of retroperitoneum and peritoneum: Secondary | ICD-10-CM | POA: Diagnosis not present

## 2023-12-01 DIAGNOSIS — I2699 Other pulmonary embolism without acute cor pulmonale: Secondary | ICD-10-CM | POA: Diagnosis not present

## 2023-12-04 DIAGNOSIS — E041 Nontoxic single thyroid nodule: Secondary | ICD-10-CM | POA: Diagnosis not present

## 2023-12-04 DIAGNOSIS — R600 Localized edema: Secondary | ICD-10-CM | POA: Diagnosis not present

## 2023-12-04 DIAGNOSIS — R188 Other ascites: Secondary | ICD-10-CM | POA: Diagnosis not present

## 2023-12-04 DIAGNOSIS — D649 Anemia, unspecified: Secondary | ICD-10-CM | POA: Diagnosis not present

## 2023-12-04 DIAGNOSIS — Z86711 Personal history of pulmonary embolism: Secondary | ICD-10-CM | POA: Diagnosis not present

## 2023-12-04 DIAGNOSIS — E782 Mixed hyperlipidemia: Secondary | ICD-10-CM | POA: Diagnosis not present

## 2023-12-04 DIAGNOSIS — R32 Unspecified urinary incontinence: Secondary | ICD-10-CM | POA: Diagnosis not present

## 2023-12-04 DIAGNOSIS — R2681 Unsteadiness on feet: Secondary | ICD-10-CM | POA: Diagnosis not present

## 2023-12-04 DIAGNOSIS — E550 Rickets, active: Secondary | ICD-10-CM | POA: Diagnosis not present

## 2023-12-04 DIAGNOSIS — F339 Major depressive disorder, recurrent, unspecified: Secondary | ICD-10-CM | POA: Diagnosis not present

## 2023-12-04 DIAGNOSIS — I2699 Other pulmonary embolism without acute cor pulmonale: Secondary | ICD-10-CM | POA: Diagnosis not present

## 2023-12-04 DIAGNOSIS — I10 Essential (primary) hypertension: Secondary | ICD-10-CM | POA: Diagnosis not present

## 2023-12-07 DIAGNOSIS — D62 Acute posthemorrhagic anemia: Secondary | ICD-10-CM | POA: Diagnosis not present

## 2023-12-07 DIAGNOSIS — Z6826 Body mass index (BMI) 26.0-26.9, adult: Secondary | ICD-10-CM | POA: Diagnosis not present

## 2023-12-07 DIAGNOSIS — K222 Esophageal obstruction: Secondary | ICD-10-CM | POA: Diagnosis not present

## 2023-12-07 DIAGNOSIS — R0902 Hypoxemia: Secondary | ICD-10-CM | POA: Diagnosis not present

## 2023-12-07 DIAGNOSIS — N179 Acute kidney failure, unspecified: Secondary | ICD-10-CM | POA: Diagnosis not present

## 2023-12-07 DIAGNOSIS — R600 Localized edema: Secondary | ICD-10-CM | POA: Diagnosis not present

## 2023-12-07 DIAGNOSIS — E876 Hypokalemia: Secondary | ICD-10-CM | POA: Diagnosis not present

## 2023-12-07 DIAGNOSIS — K2091 Esophagitis, unspecified with bleeding: Secondary | ICD-10-CM | POA: Diagnosis not present

## 2023-12-07 DIAGNOSIS — I214 Non-ST elevation (NSTEMI) myocardial infarction: Secondary | ICD-10-CM | POA: Diagnosis not present

## 2023-12-07 DIAGNOSIS — R000 Tachycardia, unspecified: Secondary | ICD-10-CM | POA: Diagnosis not present

## 2023-12-07 DIAGNOSIS — J90 Pleural effusion, not elsewhere classified: Secondary | ICD-10-CM | POA: Diagnosis not present

## 2023-12-07 DIAGNOSIS — E617 Deficiency of multiple nutrient elements: Secondary | ICD-10-CM | POA: Diagnosis not present

## 2023-12-07 DIAGNOSIS — D649 Anemia, unspecified: Secondary | ICD-10-CM | POA: Diagnosis not present

## 2023-12-07 DIAGNOSIS — Z4682 Encounter for fitting and adjustment of non-vascular catheter: Secondary | ICD-10-CM | POA: Diagnosis not present

## 2023-12-07 DIAGNOSIS — R188 Other ascites: Secondary | ICD-10-CM | POA: Diagnosis not present

## 2023-12-07 DIAGNOSIS — I50810 Right heart failure, unspecified: Secondary | ICD-10-CM | POA: Diagnosis not present

## 2023-12-07 DIAGNOSIS — R918 Other nonspecific abnormal finding of lung field: Secondary | ICD-10-CM | POA: Diagnosis not present

## 2023-12-07 DIAGNOSIS — K922 Gastrointestinal hemorrhage, unspecified: Secondary | ICD-10-CM | POA: Diagnosis not present

## 2023-12-07 DIAGNOSIS — K7689 Other specified diseases of liver: Secondary | ICD-10-CM | POA: Diagnosis not present

## 2023-12-07 DIAGNOSIS — N261 Atrophy of kidney (terminal): Secondary | ICD-10-CM | POA: Diagnosis not present

## 2023-12-07 DIAGNOSIS — R531 Weakness: Secondary | ICD-10-CM | POA: Diagnosis not present

## 2023-12-07 DIAGNOSIS — K429 Umbilical hernia without obstruction or gangrene: Secondary | ICD-10-CM | POA: Diagnosis not present

## 2023-12-07 DIAGNOSIS — Z9181 History of falling: Secondary | ICD-10-CM | POA: Diagnosis not present

## 2023-12-07 DIAGNOSIS — R609 Edema, unspecified: Secondary | ICD-10-CM | POA: Diagnosis not present

## 2023-12-07 DIAGNOSIS — I272 Pulmonary hypertension, unspecified: Secondary | ICD-10-CM | POA: Diagnosis not present

## 2023-12-07 DIAGNOSIS — I130 Hypertensive heart and chronic kidney disease with heart failure and stage 1 through stage 4 chronic kidney disease, or unspecified chronic kidney disease: Secondary | ICD-10-CM | POA: Diagnosis not present

## 2023-12-07 DIAGNOSIS — Z87891 Personal history of nicotine dependence: Secondary | ICD-10-CM | POA: Diagnosis not present

## 2023-12-07 DIAGNOSIS — N3289 Other specified disorders of bladder: Secondary | ICD-10-CM | POA: Diagnosis not present

## 2023-12-07 DIAGNOSIS — K208 Other esophagitis without bleeding: Secondary | ICD-10-CM | POA: Diagnosis not present

## 2023-12-07 DIAGNOSIS — F419 Anxiety disorder, unspecified: Secondary | ICD-10-CM | POA: Diagnosis not present

## 2023-12-07 DIAGNOSIS — R197 Diarrhea, unspecified: Secondary | ICD-10-CM | POA: Diagnosis not present

## 2023-12-07 DIAGNOSIS — I10 Essential (primary) hypertension: Secondary | ICD-10-CM | POA: Diagnosis not present

## 2023-12-07 DIAGNOSIS — Z0130 Encounter for examination of blood pressure without abnormal findings: Secondary | ICD-10-CM | POA: Diagnosis not present

## 2023-12-07 DIAGNOSIS — K449 Diaphragmatic hernia without obstruction or gangrene: Secondary | ICD-10-CM | POA: Diagnosis not present

## 2023-12-07 DIAGNOSIS — R601 Generalized edema: Secondary | ICD-10-CM | POA: Diagnosis not present

## 2023-12-08 ENCOUNTER — Encounter: Payer: Medicare PPO | Admitting: Nurse Practitioner

## 2023-12-08 DIAGNOSIS — D649 Anemia, unspecified: Secondary | ICD-10-CM | POA: Diagnosis not present

## 2023-12-08 DIAGNOSIS — K208 Other esophagitis without bleeding: Secondary | ICD-10-CM | POA: Diagnosis not present

## 2023-12-08 DIAGNOSIS — K922 Gastrointestinal hemorrhage, unspecified: Secondary | ICD-10-CM | POA: Diagnosis not present

## 2023-12-08 DIAGNOSIS — K222 Esophageal obstruction: Secondary | ICD-10-CM | POA: Diagnosis not present

## 2023-12-08 DIAGNOSIS — K449 Diaphragmatic hernia without obstruction or gangrene: Secondary | ICD-10-CM | POA: Diagnosis not present

## 2023-12-10 DIAGNOSIS — N3289 Other specified disorders of bladder: Secondary | ICD-10-CM | POA: Diagnosis not present

## 2023-12-10 DIAGNOSIS — K429 Umbilical hernia without obstruction or gangrene: Secondary | ICD-10-CM | POA: Diagnosis not present

## 2023-12-10 DIAGNOSIS — R188 Other ascites: Secondary | ICD-10-CM | POA: Diagnosis not present

## 2023-12-11 DIAGNOSIS — K7689 Other specified diseases of liver: Secondary | ICD-10-CM | POA: Diagnosis not present

## 2023-12-11 DIAGNOSIS — Z4682 Encounter for fitting and adjustment of non-vascular catheter: Secondary | ICD-10-CM | POA: Diagnosis not present

## 2023-12-11 DIAGNOSIS — K922 Gastrointestinal hemorrhage, unspecified: Secondary | ICD-10-CM | POA: Diagnosis not present

## 2023-12-16 ENCOUNTER — Other Ambulatory Visit (HOSPITAL_COMMUNITY): Payer: Self-pay

## 2023-12-18 DIAGNOSIS — E876 Hypokalemia: Secondary | ICD-10-CM | POA: Diagnosis not present

## 2023-12-18 DIAGNOSIS — I50810 Right heart failure, unspecified: Secondary | ICD-10-CM | POA: Diagnosis not present

## 2023-12-18 DIAGNOSIS — D649 Anemia, unspecified: Secondary | ICD-10-CM | POA: Diagnosis not present

## 2023-12-18 DIAGNOSIS — I272 Pulmonary hypertension, unspecified: Secondary | ICD-10-CM | POA: Diagnosis not present

## 2023-12-18 DIAGNOSIS — R0902 Hypoxemia: Secondary | ICD-10-CM | POA: Diagnosis not present

## 2023-12-18 DIAGNOSIS — J90 Pleural effusion, not elsewhere classified: Secondary | ICD-10-CM | POA: Diagnosis not present

## 2023-12-21 DIAGNOSIS — J90 Pleural effusion, not elsewhere classified: Secondary | ICD-10-CM | POA: Diagnosis not present

## 2023-12-21 DIAGNOSIS — R918 Other nonspecific abnormal finding of lung field: Secondary | ICD-10-CM | POA: Diagnosis not present

## 2023-12-24 DIAGNOSIS — Z9012 Acquired absence of left breast and nipple: Secondary | ICD-10-CM | POA: Diagnosis not present

## 2023-12-24 DIAGNOSIS — F419 Anxiety disorder, unspecified: Secondary | ICD-10-CM | POA: Diagnosis not present

## 2023-12-24 DIAGNOSIS — R2681 Unsteadiness on feet: Secondary | ICD-10-CM | POA: Diagnosis not present

## 2023-12-24 DIAGNOSIS — I2510 Atherosclerotic heart disease of native coronary artery without angina pectoris: Secondary | ICD-10-CM | POA: Diagnosis not present

## 2023-12-24 DIAGNOSIS — R188 Other ascites: Secondary | ICD-10-CM | POA: Diagnosis not present

## 2023-12-24 DIAGNOSIS — I130 Hypertensive heart and chronic kidney disease with heart failure and stage 1 through stage 4 chronic kidney disease, or unspecified chronic kidney disease: Secondary | ICD-10-CM | POA: Diagnosis not present

## 2023-12-24 DIAGNOSIS — N1831 Chronic kidney disease, stage 3a: Secondary | ICD-10-CM | POA: Diagnosis not present

## 2023-12-24 DIAGNOSIS — D509 Iron deficiency anemia, unspecified: Secondary | ICD-10-CM | POA: Diagnosis not present

## 2023-12-24 DIAGNOSIS — C482 Malignant neoplasm of peritoneum, unspecified: Secondary | ICD-10-CM | POA: Diagnosis not present

## 2023-12-24 DIAGNOSIS — F339 Major depressive disorder, recurrent, unspecified: Secondary | ICD-10-CM | POA: Diagnosis not present

## 2023-12-24 DIAGNOSIS — K449 Diaphragmatic hernia without obstruction or gangrene: Secondary | ICD-10-CM | POA: Diagnosis not present

## 2023-12-24 DIAGNOSIS — I509 Heart failure, unspecified: Secondary | ICD-10-CM | POA: Diagnosis not present

## 2023-12-24 DIAGNOSIS — F4320 Adjustment disorder, unspecified: Secondary | ICD-10-CM | POA: Diagnosis not present

## 2023-12-24 DIAGNOSIS — R197 Diarrhea, unspecified: Secondary | ICD-10-CM | POA: Diagnosis not present

## 2023-12-24 DIAGNOSIS — R600 Localized edema: Secondary | ICD-10-CM | POA: Diagnosis not present

## 2024-01-04 DIAGNOSIS — R630 Anorexia: Secondary | ICD-10-CM | POA: Diagnosis not present

## 2024-01-04 DIAGNOSIS — I2510 Atherosclerotic heart disease of native coronary artery without angina pectoris: Secondary | ICD-10-CM | POA: Diagnosis not present

## 2024-01-04 DIAGNOSIS — F419 Anxiety disorder, unspecified: Secondary | ICD-10-CM | POA: Diagnosis not present

## 2024-01-04 DIAGNOSIS — D509 Iron deficiency anemia, unspecified: Secondary | ICD-10-CM | POA: Diagnosis not present

## 2024-01-04 DIAGNOSIS — K449 Diaphragmatic hernia without obstruction or gangrene: Secondary | ICD-10-CM | POA: Diagnosis not present

## 2024-01-04 DIAGNOSIS — F4320 Adjustment disorder, unspecified: Secondary | ICD-10-CM | POA: Diagnosis not present

## 2024-01-04 DIAGNOSIS — I130 Hypertensive heart and chronic kidney disease with heart failure and stage 1 through stage 4 chronic kidney disease, or unspecified chronic kidney disease: Secondary | ICD-10-CM | POA: Diagnosis not present

## 2024-01-04 DIAGNOSIS — C482 Malignant neoplasm of peritoneum, unspecified: Secondary | ICD-10-CM | POA: Diagnosis not present

## 2024-01-04 DIAGNOSIS — R0609 Other forms of dyspnea: Secondary | ICD-10-CM | POA: Diagnosis not present

## 2024-01-04 DIAGNOSIS — N1831 Chronic kidney disease, stage 3a: Secondary | ICD-10-CM | POA: Diagnosis not present

## 2024-01-04 DIAGNOSIS — I509 Heart failure, unspecified: Secondary | ICD-10-CM | POA: Diagnosis not present

## 2024-01-04 DIAGNOSIS — R600 Localized edema: Secondary | ICD-10-CM | POA: Diagnosis not present

## 2024-01-05 DIAGNOSIS — N183 Chronic kidney disease, stage 3 unspecified: Secondary | ICD-10-CM | POA: Diagnosis not present

## 2024-01-05 DIAGNOSIS — J90 Pleural effusion, not elsewhere classified: Secondary | ICD-10-CM | POA: Diagnosis not present

## 2024-01-05 DIAGNOSIS — Z86711 Personal history of pulmonary embolism: Secondary | ICD-10-CM | POA: Diagnosis not present

## 2024-01-05 DIAGNOSIS — R188 Other ascites: Secondary | ICD-10-CM | POA: Diagnosis not present

## 2024-01-05 DIAGNOSIS — I130 Hypertensive heart and chronic kidney disease with heart failure and stage 1 through stage 4 chronic kidney disease, or unspecified chronic kidney disease: Secondary | ICD-10-CM | POA: Diagnosis not present

## 2024-01-05 DIAGNOSIS — N390 Urinary tract infection, site not specified: Secondary | ICD-10-CM | POA: Diagnosis not present

## 2024-01-05 DIAGNOSIS — K922 Gastrointestinal hemorrhage, unspecified: Secondary | ICD-10-CM | POA: Diagnosis not present

## 2024-01-05 DIAGNOSIS — J189 Pneumonia, unspecified organism: Secondary | ICD-10-CM | POA: Diagnosis not present

## 2024-01-05 DIAGNOSIS — D62 Acute posthemorrhagic anemia: Secondary | ICD-10-CM | POA: Diagnosis not present

## 2024-01-05 DIAGNOSIS — R600 Localized edema: Secondary | ICD-10-CM | POA: Diagnosis not present

## 2024-01-05 DIAGNOSIS — N179 Acute kidney failure, unspecified: Secondary | ICD-10-CM | POA: Diagnosis not present

## 2024-01-05 DIAGNOSIS — Z87891 Personal history of nicotine dependence: Secondary | ICD-10-CM | POA: Diagnosis not present

## 2024-01-05 DIAGNOSIS — I82412 Acute embolism and thrombosis of left femoral vein: Secondary | ICD-10-CM | POA: Diagnosis not present

## 2024-01-05 DIAGNOSIS — I5033 Acute on chronic diastolic (congestive) heart failure: Secondary | ICD-10-CM | POA: Diagnosis not present

## 2024-01-05 DIAGNOSIS — R609 Edema, unspecified: Secondary | ICD-10-CM | POA: Diagnosis not present

## 2024-01-05 DIAGNOSIS — I509 Heart failure, unspecified: Secondary | ICD-10-CM | POA: Diagnosis not present

## 2024-01-05 DIAGNOSIS — R000 Tachycardia, unspecified: Secondary | ICD-10-CM | POA: Diagnosis not present

## 2024-01-05 DIAGNOSIS — E46 Unspecified protein-calorie malnutrition: Secondary | ICD-10-CM | POA: Diagnosis not present

## 2024-01-05 DIAGNOSIS — R0609 Other forms of dyspnea: Secondary | ICD-10-CM | POA: Diagnosis not present

## 2024-01-05 DIAGNOSIS — K2091 Esophagitis, unspecified with bleeding: Secondary | ICD-10-CM | POA: Diagnosis not present

## 2024-01-06 DIAGNOSIS — N179 Acute kidney failure, unspecified: Secondary | ICD-10-CM | POA: Diagnosis not present

## 2024-01-06 DIAGNOSIS — D62 Acute posthemorrhagic anemia: Secondary | ICD-10-CM | POA: Diagnosis not present

## 2024-01-06 DIAGNOSIS — K2091 Esophagitis, unspecified with bleeding: Secondary | ICD-10-CM | POA: Diagnosis not present

## 2024-01-06 DIAGNOSIS — R188 Other ascites: Secondary | ICD-10-CM | POA: Diagnosis not present

## 2024-01-06 DIAGNOSIS — I50810 Right heart failure, unspecified: Secondary | ICD-10-CM | POA: Diagnosis not present

## 2024-01-06 DIAGNOSIS — I82412 Acute embolism and thrombosis of left femoral vein: Secondary | ICD-10-CM | POA: Diagnosis not present

## 2024-01-06 DIAGNOSIS — E46 Unspecified protein-calorie malnutrition: Secondary | ICD-10-CM | POA: Diagnosis not present

## 2024-01-06 DIAGNOSIS — I5033 Acute on chronic diastolic (congestive) heart failure: Secondary | ICD-10-CM | POA: Diagnosis not present

## 2024-01-06 DIAGNOSIS — N390 Urinary tract infection, site not specified: Secondary | ICD-10-CM | POA: Diagnosis not present

## 2024-01-06 DIAGNOSIS — J449 Chronic obstructive pulmonary disease, unspecified: Secondary | ICD-10-CM | POA: Diagnosis not present

## 2024-01-07 DIAGNOSIS — R188 Other ascites: Secondary | ICD-10-CM | POA: Diagnosis not present

## 2024-01-08 DIAGNOSIS — N179 Acute kidney failure, unspecified: Secondary | ICD-10-CM | POA: Diagnosis not present

## 2024-01-08 DIAGNOSIS — I82412 Acute embolism and thrombosis of left femoral vein: Secondary | ICD-10-CM | POA: Diagnosis not present

## 2024-01-08 DIAGNOSIS — I5033 Acute on chronic diastolic (congestive) heart failure: Secondary | ICD-10-CM | POA: Diagnosis not present

## 2024-01-08 DIAGNOSIS — E46 Unspecified protein-calorie malnutrition: Secondary | ICD-10-CM | POA: Diagnosis not present

## 2024-01-08 DIAGNOSIS — N390 Urinary tract infection, site not specified: Secondary | ICD-10-CM | POA: Diagnosis not present

## 2024-01-08 DIAGNOSIS — K2091 Esophagitis, unspecified with bleeding: Secondary | ICD-10-CM | POA: Diagnosis not present

## 2024-01-08 DIAGNOSIS — R188 Other ascites: Secondary | ICD-10-CM | POA: Diagnosis not present

## 2024-01-08 DIAGNOSIS — D62 Acute posthemorrhagic anemia: Secondary | ICD-10-CM | POA: Diagnosis not present

## 2024-01-09 DIAGNOSIS — R188 Other ascites: Secondary | ICD-10-CM | POA: Diagnosis not present

## 2024-01-09 DIAGNOSIS — E46 Unspecified protein-calorie malnutrition: Secondary | ICD-10-CM | POA: Diagnosis not present

## 2024-01-09 DIAGNOSIS — J90 Pleural effusion, not elsewhere classified: Secondary | ICD-10-CM | POA: Diagnosis not present

## 2024-01-09 DIAGNOSIS — D62 Acute posthemorrhagic anemia: Secondary | ICD-10-CM | POA: Diagnosis not present

## 2024-01-09 DIAGNOSIS — J9811 Atelectasis: Secondary | ICD-10-CM | POA: Diagnosis not present

## 2024-01-09 DIAGNOSIS — I82412 Acute embolism and thrombosis of left femoral vein: Secondary | ICD-10-CM | POA: Diagnosis not present

## 2024-01-09 DIAGNOSIS — I5033 Acute on chronic diastolic (congestive) heart failure: Secondary | ICD-10-CM | POA: Diagnosis not present

## 2024-01-09 DIAGNOSIS — N390 Urinary tract infection, site not specified: Secondary | ICD-10-CM | POA: Diagnosis not present

## 2024-01-09 DIAGNOSIS — K2091 Esophagitis, unspecified with bleeding: Secondary | ICD-10-CM | POA: Diagnosis not present

## 2024-01-09 DIAGNOSIS — N179 Acute kidney failure, unspecified: Secondary | ICD-10-CM | POA: Diagnosis not present

## 2024-01-09 DIAGNOSIS — R918 Other nonspecific abnormal finding of lung field: Secondary | ICD-10-CM | POA: Diagnosis not present

## 2024-01-10 DIAGNOSIS — D62 Acute posthemorrhagic anemia: Secondary | ICD-10-CM | POA: Diagnosis not present

## 2024-01-10 DIAGNOSIS — N390 Urinary tract infection, site not specified: Secondary | ICD-10-CM | POA: Diagnosis not present

## 2024-01-10 DIAGNOSIS — N179 Acute kidney failure, unspecified: Secondary | ICD-10-CM | POA: Diagnosis not present

## 2024-01-10 DIAGNOSIS — I82412 Acute embolism and thrombosis of left femoral vein: Secondary | ICD-10-CM | POA: Diagnosis not present

## 2024-01-10 DIAGNOSIS — R188 Other ascites: Secondary | ICD-10-CM | POA: Diagnosis not present

## 2024-01-10 DIAGNOSIS — I5033 Acute on chronic diastolic (congestive) heart failure: Secondary | ICD-10-CM | POA: Diagnosis not present

## 2024-01-10 DIAGNOSIS — E46 Unspecified protein-calorie malnutrition: Secondary | ICD-10-CM | POA: Diagnosis not present

## 2024-01-10 DIAGNOSIS — I130 Hypertensive heart and chronic kidney disease with heart failure and stage 1 through stage 4 chronic kidney disease, or unspecified chronic kidney disease: Secondary | ICD-10-CM | POA: Diagnosis not present

## 2024-01-10 DIAGNOSIS — K2091 Esophagitis, unspecified with bleeding: Secondary | ICD-10-CM | POA: Diagnosis not present

## 2024-01-11 DIAGNOSIS — N390 Urinary tract infection, site not specified: Secondary | ICD-10-CM | POA: Diagnosis not present

## 2024-01-11 DIAGNOSIS — D62 Acute posthemorrhagic anemia: Secondary | ICD-10-CM | POA: Diagnosis not present

## 2024-01-11 DIAGNOSIS — K2091 Esophagitis, unspecified with bleeding: Secondary | ICD-10-CM | POA: Diagnosis not present

## 2024-01-11 DIAGNOSIS — E46 Unspecified protein-calorie malnutrition: Secondary | ICD-10-CM | POA: Diagnosis not present

## 2024-01-11 DIAGNOSIS — I82412 Acute embolism and thrombosis of left femoral vein: Secondary | ICD-10-CM | POA: Diagnosis not present

## 2024-01-11 DIAGNOSIS — R188 Other ascites: Secondary | ICD-10-CM | POA: Diagnosis not present

## 2024-01-11 DIAGNOSIS — N179 Acute kidney failure, unspecified: Secondary | ICD-10-CM | POA: Diagnosis not present

## 2024-01-11 DIAGNOSIS — I5033 Acute on chronic diastolic (congestive) heart failure: Secondary | ICD-10-CM | POA: Diagnosis not present

## 2024-01-12 DIAGNOSIS — I82412 Acute embolism and thrombosis of left femoral vein: Secondary | ICD-10-CM | POA: Diagnosis not present

## 2024-01-12 DIAGNOSIS — N390 Urinary tract infection, site not specified: Secondary | ICD-10-CM | POA: Diagnosis not present

## 2024-01-12 DIAGNOSIS — D62 Acute posthemorrhagic anemia: Secondary | ICD-10-CM | POA: Diagnosis not present

## 2024-01-12 DIAGNOSIS — I5033 Acute on chronic diastolic (congestive) heart failure: Secondary | ICD-10-CM | POA: Diagnosis not present

## 2024-01-12 DIAGNOSIS — N179 Acute kidney failure, unspecified: Secondary | ICD-10-CM | POA: Diagnosis not present

## 2024-01-12 DIAGNOSIS — E46 Unspecified protein-calorie malnutrition: Secondary | ICD-10-CM | POA: Diagnosis not present

## 2024-01-12 DIAGNOSIS — K2091 Esophagitis, unspecified with bleeding: Secondary | ICD-10-CM | POA: Diagnosis not present

## 2024-01-12 DIAGNOSIS — R188 Other ascites: Secondary | ICD-10-CM | POA: Diagnosis not present

## 2024-01-13 DIAGNOSIS — I5033 Acute on chronic diastolic (congestive) heart failure: Secondary | ICD-10-CM | POA: Diagnosis not present

## 2024-01-13 DIAGNOSIS — D62 Acute posthemorrhagic anemia: Secondary | ICD-10-CM | POA: Diagnosis not present

## 2024-01-13 DIAGNOSIS — I82412 Acute embolism and thrombosis of left femoral vein: Secondary | ICD-10-CM | POA: Diagnosis not present

## 2024-01-13 DIAGNOSIS — E46 Unspecified protein-calorie malnutrition: Secondary | ICD-10-CM | POA: Diagnosis not present

## 2024-01-13 DIAGNOSIS — R188 Other ascites: Secondary | ICD-10-CM | POA: Diagnosis not present

## 2024-01-13 DIAGNOSIS — N390 Urinary tract infection, site not specified: Secondary | ICD-10-CM | POA: Diagnosis not present

## 2024-01-13 DIAGNOSIS — N179 Acute kidney failure, unspecified: Secondary | ICD-10-CM | POA: Diagnosis not present

## 2024-01-13 DIAGNOSIS — K2091 Esophagitis, unspecified with bleeding: Secondary | ICD-10-CM | POA: Diagnosis not present

## 2024-01-14 DIAGNOSIS — I130 Hypertensive heart and chronic kidney disease with heart failure and stage 1 through stage 4 chronic kidney disease, or unspecified chronic kidney disease: Secondary | ICD-10-CM | POA: Diagnosis not present

## 2024-01-14 DIAGNOSIS — D62 Acute posthemorrhagic anemia: Secondary | ICD-10-CM | POA: Diagnosis not present

## 2024-01-14 DIAGNOSIS — N179 Acute kidney failure, unspecified: Secondary | ICD-10-CM | POA: Diagnosis not present

## 2024-01-14 DIAGNOSIS — K2091 Esophagitis, unspecified with bleeding: Secondary | ICD-10-CM | POA: Diagnosis not present

## 2024-01-14 DIAGNOSIS — I5033 Acute on chronic diastolic (congestive) heart failure: Secondary | ICD-10-CM | POA: Diagnosis not present

## 2024-01-14 DIAGNOSIS — E46 Unspecified protein-calorie malnutrition: Secondary | ICD-10-CM | POA: Diagnosis not present

## 2024-01-14 DIAGNOSIS — R188 Other ascites: Secondary | ICD-10-CM | POA: Diagnosis not present

## 2024-01-14 DIAGNOSIS — N390 Urinary tract infection, site not specified: Secondary | ICD-10-CM | POA: Diagnosis not present

## 2024-01-14 DIAGNOSIS — I82412 Acute embolism and thrombosis of left femoral vein: Secondary | ICD-10-CM | POA: Diagnosis not present

## 2024-01-15 DIAGNOSIS — I82412 Acute embolism and thrombosis of left femoral vein: Secondary | ICD-10-CM | POA: Diagnosis not present

## 2024-01-15 DIAGNOSIS — N390 Urinary tract infection, site not specified: Secondary | ICD-10-CM | POA: Diagnosis not present

## 2024-01-15 DIAGNOSIS — I5033 Acute on chronic diastolic (congestive) heart failure: Secondary | ICD-10-CM | POA: Diagnosis not present

## 2024-01-15 DIAGNOSIS — K2091 Esophagitis, unspecified with bleeding: Secondary | ICD-10-CM | POA: Diagnosis not present

## 2024-01-15 DIAGNOSIS — N179 Acute kidney failure, unspecified: Secondary | ICD-10-CM | POA: Diagnosis not present

## 2024-01-15 DIAGNOSIS — E46 Unspecified protein-calorie malnutrition: Secondary | ICD-10-CM | POA: Diagnosis not present

## 2024-01-15 DIAGNOSIS — R188 Other ascites: Secondary | ICD-10-CM | POA: Diagnosis not present

## 2024-01-15 DIAGNOSIS — D62 Acute posthemorrhagic anemia: Secondary | ICD-10-CM | POA: Diagnosis not present

## 2024-01-16 DIAGNOSIS — N390 Urinary tract infection, site not specified: Secondary | ICD-10-CM | POA: Diagnosis not present

## 2024-01-16 DIAGNOSIS — M160 Bilateral primary osteoarthritis of hip: Secondary | ICD-10-CM | POA: Diagnosis not present

## 2024-01-16 DIAGNOSIS — N3289 Other specified disorders of bladder: Secondary | ICD-10-CM | POA: Diagnosis not present

## 2024-01-16 DIAGNOSIS — I82412 Acute embolism and thrombosis of left femoral vein: Secondary | ICD-10-CM | POA: Diagnosis not present

## 2024-01-16 DIAGNOSIS — R188 Other ascites: Secondary | ICD-10-CM | POA: Diagnosis not present

## 2024-01-16 DIAGNOSIS — E46 Unspecified protein-calorie malnutrition: Secondary | ICD-10-CM | POA: Diagnosis not present

## 2024-01-16 DIAGNOSIS — D62 Acute posthemorrhagic anemia: Secondary | ICD-10-CM | POA: Diagnosis not present

## 2024-01-16 DIAGNOSIS — N179 Acute kidney failure, unspecified: Secondary | ICD-10-CM | POA: Diagnosis not present

## 2024-01-16 DIAGNOSIS — I5033 Acute on chronic diastolic (congestive) heart failure: Secondary | ICD-10-CM | POA: Diagnosis not present

## 2024-01-16 DIAGNOSIS — K2091 Esophagitis, unspecified with bleeding: Secondary | ICD-10-CM | POA: Diagnosis not present

## 2024-01-17 DIAGNOSIS — I130 Hypertensive heart and chronic kidney disease with heart failure and stage 1 through stage 4 chronic kidney disease, or unspecified chronic kidney disease: Secondary | ICD-10-CM | POA: Diagnosis not present

## 2024-01-17 DIAGNOSIS — N179 Acute kidney failure, unspecified: Secondary | ICD-10-CM | POA: Diagnosis not present

## 2024-01-17 DIAGNOSIS — K2091 Esophagitis, unspecified with bleeding: Secondary | ICD-10-CM | POA: Diagnosis not present

## 2024-01-17 DIAGNOSIS — I82412 Acute embolism and thrombosis of left femoral vein: Secondary | ICD-10-CM | POA: Diagnosis not present

## 2024-01-17 DIAGNOSIS — R188 Other ascites: Secondary | ICD-10-CM | POA: Diagnosis not present

## 2024-01-17 DIAGNOSIS — E46 Unspecified protein-calorie malnutrition: Secondary | ICD-10-CM | POA: Diagnosis not present

## 2024-01-17 DIAGNOSIS — D62 Acute posthemorrhagic anemia: Secondary | ICD-10-CM | POA: Diagnosis not present

## 2024-01-17 DIAGNOSIS — N390 Urinary tract infection, site not specified: Secondary | ICD-10-CM | POA: Diagnosis not present

## 2024-01-17 DIAGNOSIS — I5033 Acute on chronic diastolic (congestive) heart failure: Secondary | ICD-10-CM | POA: Diagnosis not present

## 2024-01-18 DIAGNOSIS — R188 Other ascites: Secondary | ICD-10-CM | POA: Diagnosis not present

## 2024-01-18 DIAGNOSIS — E46 Unspecified protein-calorie malnutrition: Secondary | ICD-10-CM | POA: Diagnosis not present

## 2024-01-18 DIAGNOSIS — K2091 Esophagitis, unspecified with bleeding: Secondary | ICD-10-CM | POA: Diagnosis not present

## 2024-01-18 DIAGNOSIS — I82412 Acute embolism and thrombosis of left femoral vein: Secondary | ICD-10-CM | POA: Diagnosis not present

## 2024-01-18 DIAGNOSIS — I5033 Acute on chronic diastolic (congestive) heart failure: Secondary | ICD-10-CM | POA: Diagnosis not present

## 2024-01-18 DIAGNOSIS — D62 Acute posthemorrhagic anemia: Secondary | ICD-10-CM | POA: Diagnosis not present

## 2024-01-18 DIAGNOSIS — N390 Urinary tract infection, site not specified: Secondary | ICD-10-CM | POA: Diagnosis not present

## 2024-01-18 DIAGNOSIS — N179 Acute kidney failure, unspecified: Secondary | ICD-10-CM | POA: Diagnosis not present

## 2024-01-19 DIAGNOSIS — N390 Urinary tract infection, site not specified: Secondary | ICD-10-CM | POA: Diagnosis not present

## 2024-01-19 DIAGNOSIS — I130 Hypertensive heart and chronic kidney disease with heart failure and stage 1 through stage 4 chronic kidney disease, or unspecified chronic kidney disease: Secondary | ICD-10-CM | POA: Diagnosis not present

## 2024-01-19 DIAGNOSIS — N179 Acute kidney failure, unspecified: Secondary | ICD-10-CM | POA: Diagnosis not present

## 2024-01-19 DIAGNOSIS — I82412 Acute embolism and thrombosis of left femoral vein: Secondary | ICD-10-CM | POA: Diagnosis not present

## 2024-01-19 DIAGNOSIS — I5033 Acute on chronic diastolic (congestive) heart failure: Secondary | ICD-10-CM | POA: Diagnosis not present

## 2024-01-19 DIAGNOSIS — D62 Acute posthemorrhagic anemia: Secondary | ICD-10-CM | POA: Diagnosis not present

## 2024-01-19 DIAGNOSIS — K2091 Esophagitis, unspecified with bleeding: Secondary | ICD-10-CM | POA: Diagnosis not present

## 2024-01-19 DIAGNOSIS — E46 Unspecified protein-calorie malnutrition: Secondary | ICD-10-CM | POA: Diagnosis not present

## 2024-01-19 DIAGNOSIS — R188 Other ascites: Secondary | ICD-10-CM | POA: Diagnosis not present

## 2024-01-20 DIAGNOSIS — N390 Urinary tract infection, site not specified: Secondary | ICD-10-CM | POA: Diagnosis not present

## 2024-01-20 DIAGNOSIS — K2091 Esophagitis, unspecified with bleeding: Secondary | ICD-10-CM | POA: Diagnosis not present

## 2024-01-20 DIAGNOSIS — D62 Acute posthemorrhagic anemia: Secondary | ICD-10-CM | POA: Diagnosis not present

## 2024-01-20 DIAGNOSIS — I130 Hypertensive heart and chronic kidney disease with heart failure and stage 1 through stage 4 chronic kidney disease, or unspecified chronic kidney disease: Secondary | ICD-10-CM | POA: Diagnosis not present

## 2024-01-20 DIAGNOSIS — I82412 Acute embolism and thrombosis of left femoral vein: Secondary | ICD-10-CM | POA: Diagnosis not present

## 2024-01-20 DIAGNOSIS — R188 Other ascites: Secondary | ICD-10-CM | POA: Diagnosis not present

## 2024-01-20 DIAGNOSIS — I5033 Acute on chronic diastolic (congestive) heart failure: Secondary | ICD-10-CM | POA: Diagnosis not present

## 2024-01-20 DIAGNOSIS — N179 Acute kidney failure, unspecified: Secondary | ICD-10-CM | POA: Diagnosis not present

## 2024-01-20 DIAGNOSIS — E46 Unspecified protein-calorie malnutrition: Secondary | ICD-10-CM | POA: Diagnosis not present

## 2024-01-21 DIAGNOSIS — N179 Acute kidney failure, unspecified: Secondary | ICD-10-CM | POA: Diagnosis not present

## 2024-01-21 DIAGNOSIS — K7689 Other specified diseases of liver: Secondary | ICD-10-CM | POA: Diagnosis not present

## 2024-01-21 DIAGNOSIS — R188 Other ascites: Secondary | ICD-10-CM | POA: Diagnosis not present

## 2024-01-21 DIAGNOSIS — I5033 Acute on chronic diastolic (congestive) heart failure: Secondary | ICD-10-CM | POA: Diagnosis not present

## 2024-01-21 DIAGNOSIS — J90 Pleural effusion, not elsewhere classified: Secondary | ICD-10-CM | POA: Diagnosis not present

## 2024-01-21 DIAGNOSIS — E46 Unspecified protein-calorie malnutrition: Secondary | ICD-10-CM | POA: Diagnosis not present

## 2024-01-21 DIAGNOSIS — I130 Hypertensive heart and chronic kidney disease with heart failure and stage 1 through stage 4 chronic kidney disease, or unspecified chronic kidney disease: Secondary | ICD-10-CM | POA: Diagnosis not present

## 2024-01-21 DIAGNOSIS — K2091 Esophagitis, unspecified with bleeding: Secondary | ICD-10-CM | POA: Diagnosis not present

## 2024-01-21 DIAGNOSIS — N390 Urinary tract infection, site not specified: Secondary | ICD-10-CM | POA: Diagnosis not present

## 2024-01-21 DIAGNOSIS — D62 Acute posthemorrhagic anemia: Secondary | ICD-10-CM | POA: Diagnosis not present

## 2024-01-21 DIAGNOSIS — I82412 Acute embolism and thrombosis of left femoral vein: Secondary | ICD-10-CM | POA: Diagnosis not present

## 2024-01-22 DIAGNOSIS — I5033 Acute on chronic diastolic (congestive) heart failure: Secondary | ICD-10-CM | POA: Diagnosis not present

## 2024-01-22 DIAGNOSIS — E042 Nontoxic multinodular goiter: Secondary | ICD-10-CM | POA: Diagnosis not present

## 2024-01-22 DIAGNOSIS — I130 Hypertensive heart and chronic kidney disease with heart failure and stage 1 through stage 4 chronic kidney disease, or unspecified chronic kidney disease: Secondary | ICD-10-CM | POA: Diagnosis not present

## 2024-01-22 DIAGNOSIS — K2091 Esophagitis, unspecified with bleeding: Secondary | ICD-10-CM | POA: Diagnosis not present

## 2024-01-22 DIAGNOSIS — N390 Urinary tract infection, site not specified: Secondary | ICD-10-CM | POA: Diagnosis not present

## 2024-01-22 DIAGNOSIS — E46 Unspecified protein-calorie malnutrition: Secondary | ICD-10-CM | POA: Diagnosis not present

## 2024-01-22 DIAGNOSIS — D62 Acute posthemorrhagic anemia: Secondary | ICD-10-CM | POA: Diagnosis not present

## 2024-01-22 DIAGNOSIS — I82412 Acute embolism and thrombosis of left femoral vein: Secondary | ICD-10-CM | POA: Diagnosis not present

## 2024-01-22 DIAGNOSIS — N179 Acute kidney failure, unspecified: Secondary | ICD-10-CM | POA: Diagnosis not present

## 2024-01-22 DIAGNOSIS — R188 Other ascites: Secondary | ICD-10-CM | POA: Diagnosis not present

## 2024-01-22 DIAGNOSIS — R9389 Abnormal findings on diagnostic imaging of other specified body structures: Secondary | ICD-10-CM | POA: Diagnosis not present

## 2024-01-23 DIAGNOSIS — I82412 Acute embolism and thrombosis of left femoral vein: Secondary | ICD-10-CM | POA: Diagnosis not present

## 2024-01-23 DIAGNOSIS — E46 Unspecified protein-calorie malnutrition: Secondary | ICD-10-CM | POA: Diagnosis not present

## 2024-01-23 DIAGNOSIS — N390 Urinary tract infection, site not specified: Secondary | ICD-10-CM | POA: Diagnosis not present

## 2024-01-23 DIAGNOSIS — I5033 Acute on chronic diastolic (congestive) heart failure: Secondary | ICD-10-CM | POA: Diagnosis not present

## 2024-01-23 DIAGNOSIS — R188 Other ascites: Secondary | ICD-10-CM | POA: Diagnosis not present

## 2024-01-23 DIAGNOSIS — D62 Acute posthemorrhagic anemia: Secondary | ICD-10-CM | POA: Diagnosis not present

## 2024-01-23 DIAGNOSIS — K2091 Esophagitis, unspecified with bleeding: Secondary | ICD-10-CM | POA: Diagnosis not present

## 2024-01-23 DIAGNOSIS — N179 Acute kidney failure, unspecified: Secondary | ICD-10-CM | POA: Diagnosis not present

## 2024-01-24 DIAGNOSIS — I5033 Acute on chronic diastolic (congestive) heart failure: Secondary | ICD-10-CM | POA: Diagnosis not present

## 2024-01-24 DIAGNOSIS — N390 Urinary tract infection, site not specified: Secondary | ICD-10-CM | POA: Diagnosis not present

## 2024-01-24 DIAGNOSIS — N179 Acute kidney failure, unspecified: Secondary | ICD-10-CM | POA: Diagnosis not present

## 2024-01-24 DIAGNOSIS — D62 Acute posthemorrhagic anemia: Secondary | ICD-10-CM | POA: Diagnosis not present

## 2024-01-24 DIAGNOSIS — I130 Hypertensive heart and chronic kidney disease with heart failure and stage 1 through stage 4 chronic kidney disease, or unspecified chronic kidney disease: Secondary | ICD-10-CM | POA: Diagnosis not present

## 2024-01-24 DIAGNOSIS — E46 Unspecified protein-calorie malnutrition: Secondary | ICD-10-CM | POA: Diagnosis not present

## 2024-01-24 DIAGNOSIS — K2091 Esophagitis, unspecified with bleeding: Secondary | ICD-10-CM | POA: Diagnosis not present

## 2024-01-24 DIAGNOSIS — I82412 Acute embolism and thrombosis of left femoral vein: Secondary | ICD-10-CM | POA: Diagnosis not present

## 2024-01-24 DIAGNOSIS — R188 Other ascites: Secondary | ICD-10-CM | POA: Diagnosis not present

## 2024-01-25 DIAGNOSIS — N179 Acute kidney failure, unspecified: Secondary | ICD-10-CM | POA: Diagnosis not present

## 2024-01-25 DIAGNOSIS — R188 Other ascites: Secondary | ICD-10-CM | POA: Diagnosis not present

## 2024-01-25 DIAGNOSIS — I5033 Acute on chronic diastolic (congestive) heart failure: Secondary | ICD-10-CM | POA: Diagnosis not present

## 2024-01-25 DIAGNOSIS — I130 Hypertensive heart and chronic kidney disease with heart failure and stage 1 through stage 4 chronic kidney disease, or unspecified chronic kidney disease: Secondary | ICD-10-CM | POA: Diagnosis not present

## 2024-01-25 DIAGNOSIS — K2091 Esophagitis, unspecified with bleeding: Secondary | ICD-10-CM | POA: Diagnosis not present

## 2024-01-25 DIAGNOSIS — I82412 Acute embolism and thrombosis of left femoral vein: Secondary | ICD-10-CM | POA: Diagnosis not present

## 2024-01-25 DIAGNOSIS — E46 Unspecified protein-calorie malnutrition: Secondary | ICD-10-CM | POA: Diagnosis not present

## 2024-01-25 DIAGNOSIS — D62 Acute posthemorrhagic anemia: Secondary | ICD-10-CM | POA: Diagnosis not present

## 2024-01-25 DIAGNOSIS — N390 Urinary tract infection, site not specified: Secondary | ICD-10-CM | POA: Diagnosis not present

## 2024-01-26 DIAGNOSIS — K2091 Esophagitis, unspecified with bleeding: Secondary | ICD-10-CM | POA: Diagnosis not present

## 2024-01-26 DIAGNOSIS — I5033 Acute on chronic diastolic (congestive) heart failure: Secondary | ICD-10-CM | POA: Diagnosis not present

## 2024-01-26 DIAGNOSIS — N179 Acute kidney failure, unspecified: Secondary | ICD-10-CM | POA: Diagnosis not present

## 2024-01-26 DIAGNOSIS — E46 Unspecified protein-calorie malnutrition: Secondary | ICD-10-CM | POA: Diagnosis not present

## 2024-01-26 DIAGNOSIS — I82412 Acute embolism and thrombosis of left femoral vein: Secondary | ICD-10-CM | POA: Diagnosis not present

## 2024-01-26 DIAGNOSIS — N390 Urinary tract infection, site not specified: Secondary | ICD-10-CM | POA: Diagnosis not present

## 2024-01-26 DIAGNOSIS — I130 Hypertensive heart and chronic kidney disease with heart failure and stage 1 through stage 4 chronic kidney disease, or unspecified chronic kidney disease: Secondary | ICD-10-CM | POA: Diagnosis not present

## 2024-01-26 DIAGNOSIS — D62 Acute posthemorrhagic anemia: Secondary | ICD-10-CM | POA: Diagnosis not present

## 2024-01-26 DIAGNOSIS — R188 Other ascites: Secondary | ICD-10-CM | POA: Diagnosis not present

## 2024-01-27 DIAGNOSIS — I5033 Acute on chronic diastolic (congestive) heart failure: Secondary | ICD-10-CM | POA: Diagnosis not present

## 2024-01-27 DIAGNOSIS — C786 Secondary malignant neoplasm of retroperitoneum and peritoneum: Secondary | ICD-10-CM | POA: Diagnosis not present

## 2024-01-27 DIAGNOSIS — R1909 Other intra-abdominal and pelvic swelling, mass and lump: Secondary | ICD-10-CM | POA: Diagnosis not present

## 2024-01-27 DIAGNOSIS — I82412 Acute embolism and thrombosis of left femoral vein: Secondary | ICD-10-CM | POA: Diagnosis not present

## 2024-01-27 DIAGNOSIS — E46 Unspecified protein-calorie malnutrition: Secondary | ICD-10-CM | POA: Diagnosis not present

## 2024-01-27 DIAGNOSIS — N179 Acute kidney failure, unspecified: Secondary | ICD-10-CM | POA: Diagnosis not present

## 2024-01-27 DIAGNOSIS — K2091 Esophagitis, unspecified with bleeding: Secondary | ICD-10-CM | POA: Diagnosis not present

## 2024-01-27 DIAGNOSIS — D62 Acute posthemorrhagic anemia: Secondary | ICD-10-CM | POA: Diagnosis not present

## 2024-01-27 DIAGNOSIS — R188 Other ascites: Secondary | ICD-10-CM | POA: Diagnosis not present

## 2024-01-27 DIAGNOSIS — N390 Urinary tract infection, site not specified: Secondary | ICD-10-CM | POA: Diagnosis not present

## 2024-01-27 DIAGNOSIS — I130 Hypertensive heart and chronic kidney disease with heart failure and stage 1 through stage 4 chronic kidney disease, or unspecified chronic kidney disease: Secondary | ICD-10-CM | POA: Diagnosis not present

## 2024-01-28 DIAGNOSIS — C579 Malignant neoplasm of female genital organ, unspecified: Secondary | ICD-10-CM | POA: Diagnosis not present

## 2024-01-28 DIAGNOSIS — R188 Other ascites: Secondary | ICD-10-CM | POA: Diagnosis not present

## 2024-01-28 DIAGNOSIS — R0602 Shortness of breath: Secondary | ICD-10-CM | POA: Diagnosis not present

## 2024-01-28 DIAGNOSIS — N183 Chronic kidney disease, stage 3 unspecified: Secondary | ICD-10-CM | POA: Diagnosis not present

## 2024-01-28 DIAGNOSIS — N179 Acute kidney failure, unspecified: Secondary | ICD-10-CM | POA: Diagnosis not present

## 2024-01-28 DIAGNOSIS — N390 Urinary tract infection, site not specified: Secondary | ICD-10-CM | POA: Diagnosis not present

## 2024-01-28 DIAGNOSIS — I2722 Pulmonary hypertension due to left heart disease: Secondary | ICD-10-CM | POA: Diagnosis not present

## 2024-01-28 DIAGNOSIS — I130 Hypertensive heart and chronic kidney disease with heart failure and stage 1 through stage 4 chronic kidney disease, or unspecified chronic kidney disease: Secondary | ICD-10-CM | POA: Diagnosis not present

## 2024-01-28 DIAGNOSIS — N3001 Acute cystitis with hematuria: Secondary | ICD-10-CM | POA: Diagnosis not present

## 2024-01-28 DIAGNOSIS — R531 Weakness: Secondary | ICD-10-CM | POA: Diagnosis not present

## 2024-01-28 DIAGNOSIS — I82412 Acute embolism and thrombosis of left femoral vein: Secondary | ICD-10-CM | POA: Diagnosis not present

## 2024-01-28 DIAGNOSIS — C786 Secondary malignant neoplasm of retroperitoneum and peritoneum: Secondary | ICD-10-CM | POA: Diagnosis not present

## 2024-01-28 DIAGNOSIS — I5033 Acute on chronic diastolic (congestive) heart failure: Secondary | ICD-10-CM | POA: Diagnosis not present

## 2024-01-28 DIAGNOSIS — I10 Essential (primary) hypertension: Secondary | ICD-10-CM | POA: Diagnosis not present

## 2024-01-28 DIAGNOSIS — J90 Pleural effusion, not elsewhere classified: Secondary | ICD-10-CM | POA: Diagnosis not present

## 2024-01-28 DIAGNOSIS — R0789 Other chest pain: Secondary | ICD-10-CM | POA: Diagnosis not present

## 2024-01-28 DIAGNOSIS — R197 Diarrhea, unspecified: Secondary | ICD-10-CM | POA: Diagnosis not present

## 2024-01-28 DIAGNOSIS — R0902 Hypoxemia: Secondary | ICD-10-CM | POA: Diagnosis not present

## 2024-01-28 DIAGNOSIS — E46 Unspecified protein-calorie malnutrition: Secondary | ICD-10-CM | POA: Diagnosis not present

## 2024-01-28 DIAGNOSIS — M6281 Muscle weakness (generalized): Secondary | ICD-10-CM | POA: Diagnosis not present

## 2024-01-28 DIAGNOSIS — D62 Acute posthemorrhagic anemia: Secondary | ICD-10-CM | POA: Diagnosis not present

## 2024-01-28 DIAGNOSIS — D500 Iron deficiency anemia secondary to blood loss (chronic): Secondary | ICD-10-CM | POA: Diagnosis not present

## 2024-01-28 DIAGNOSIS — R978 Other abnormal tumor markers: Secondary | ICD-10-CM | POA: Diagnosis not present

## 2024-01-28 DIAGNOSIS — K2091 Esophagitis, unspecified with bleeding: Secondary | ICD-10-CM | POA: Diagnosis not present

## 2024-02-03 DIAGNOSIS — N3001 Acute cystitis with hematuria: Secondary | ICD-10-CM | POA: Diagnosis not present

## 2024-02-03 DIAGNOSIS — D500 Iron deficiency anemia secondary to blood loss (chronic): Secondary | ICD-10-CM | POA: Diagnosis not present

## 2024-02-03 DIAGNOSIS — R188 Other ascites: Secondary | ICD-10-CM | POA: Diagnosis not present

## 2024-02-03 DIAGNOSIS — M6281 Muscle weakness (generalized): Secondary | ICD-10-CM | POA: Diagnosis not present

## 2024-02-03 DIAGNOSIS — I82412 Acute embolism and thrombosis of left femoral vein: Secondary | ICD-10-CM | POA: Diagnosis not present

## 2024-02-03 DIAGNOSIS — I5033 Acute on chronic diastolic (congestive) heart failure: Secondary | ICD-10-CM | POA: Diagnosis not present

## 2024-02-10 DIAGNOSIS — D500 Iron deficiency anemia secondary to blood loss (chronic): Secondary | ICD-10-CM | POA: Diagnosis not present

## 2024-02-10 DIAGNOSIS — N3001 Acute cystitis with hematuria: Secondary | ICD-10-CM | POA: Diagnosis not present

## 2024-02-10 DIAGNOSIS — R188 Other ascites: Secondary | ICD-10-CM | POA: Diagnosis not present

## 2024-02-10 DIAGNOSIS — I5033 Acute on chronic diastolic (congestive) heart failure: Secondary | ICD-10-CM | POA: Diagnosis not present

## 2024-02-10 DIAGNOSIS — I2722 Pulmonary hypertension due to left heart disease: Secondary | ICD-10-CM | POA: Diagnosis not present

## 2024-02-10 DIAGNOSIS — I82412 Acute embolism and thrombosis of left femoral vein: Secondary | ICD-10-CM | POA: Diagnosis not present

## 2024-02-12 DIAGNOSIS — N179 Acute kidney failure, unspecified: Secondary | ICD-10-CM | POA: Diagnosis not present

## 2024-02-12 DIAGNOSIS — C579 Malignant neoplasm of female genital organ, unspecified: Secondary | ICD-10-CM | POA: Diagnosis not present

## 2024-02-12 DIAGNOSIS — R0902 Hypoxemia: Secondary | ICD-10-CM | POA: Diagnosis not present

## 2024-02-12 DIAGNOSIS — R978 Other abnormal tumor markers: Secondary | ICD-10-CM | POA: Diagnosis not present

## 2024-02-12 DIAGNOSIS — J90 Pleural effusion, not elsewhere classified: Secondary | ICD-10-CM | POA: Diagnosis not present

## 2024-02-12 DIAGNOSIS — N183 Chronic kidney disease, stage 3 unspecified: Secondary | ICD-10-CM | POA: Diagnosis not present

## 2024-02-12 DIAGNOSIS — R531 Weakness: Secondary | ICD-10-CM | POA: Diagnosis not present

## 2024-02-12 DIAGNOSIS — R197 Diarrhea, unspecified: Secondary | ICD-10-CM | POA: Diagnosis not present

## 2024-02-12 DIAGNOSIS — R0789 Other chest pain: Secondary | ICD-10-CM | POA: Diagnosis not present

## 2024-02-12 DIAGNOSIS — R0602 Shortness of breath: Secondary | ICD-10-CM | POA: Diagnosis not present

## 2024-02-12 DIAGNOSIS — C786 Secondary malignant neoplasm of retroperitoneum and peritoneum: Secondary | ICD-10-CM | POA: Diagnosis not present

## 2024-02-14 DIAGNOSIS — J90 Pleural effusion, not elsewhere classified: Secondary | ICD-10-CM | POA: Diagnosis not present

## 2024-02-15 DIAGNOSIS — C801 Malignant (primary) neoplasm, unspecified: Secondary | ICD-10-CM | POA: Diagnosis not present

## 2024-02-15 DIAGNOSIS — J910 Malignant pleural effusion: Secondary | ICD-10-CM | POA: Diagnosis not present

## 2024-02-15 DIAGNOSIS — C786 Secondary malignant neoplasm of retroperitoneum and peritoneum: Secondary | ICD-10-CM | POA: Diagnosis not present

## 2024-02-15 DIAGNOSIS — R180 Malignant ascites: Secondary | ICD-10-CM | POA: Diagnosis not present

## 2024-02-15 DIAGNOSIS — E43 Unspecified severe protein-calorie malnutrition: Secondary | ICD-10-CM | POA: Diagnosis not present

## 2024-02-15 DIAGNOSIS — C800 Disseminated malignant neoplasm, unspecified: Secondary | ICD-10-CM | POA: Diagnosis not present

## 2024-04-05 ENCOUNTER — Other Ambulatory Visit: Payer: Self-pay
